# Patient Record
Sex: Male | Born: 1955 | Race: White | Hispanic: No | Smoking: Never smoker
Health system: Southern US, Community
[De-identification: ages and names within clinical notes are randomized; demographics above are authoritative.]

## PROBLEM LIST (undated history)

## (undated) DIAGNOSIS — Z9889 Other specified postprocedural states: Secondary | ICD-10-CM

## (undated) DIAGNOSIS — I739 Peripheral vascular disease, unspecified: Secondary | ICD-10-CM

## (undated) DIAGNOSIS — T7840XA Allergy, unspecified, initial encounter: Secondary | ICD-10-CM

## (undated) DIAGNOSIS — R0600 Dyspnea, unspecified: Secondary | ICD-10-CM

## (undated) DIAGNOSIS — I499 Cardiac arrhythmia, unspecified: Secondary | ICD-10-CM

## (undated) DIAGNOSIS — C801 Malignant (primary) neoplasm, unspecified: Secondary | ICD-10-CM

## (undated) DIAGNOSIS — I341 Nonrheumatic mitral (valve) prolapse: Secondary | ICD-10-CM

## (undated) DIAGNOSIS — F319 Bipolar disorder, unspecified: Secondary | ICD-10-CM

## (undated) DIAGNOSIS — F32A Depression, unspecified: Secondary | ICD-10-CM

## (undated) DIAGNOSIS — R011 Cardiac murmur, unspecified: Secondary | ICD-10-CM

## (undated) DIAGNOSIS — I2510 Atherosclerotic heart disease of native coronary artery without angina pectoris: Secondary | ICD-10-CM

## (undated) DIAGNOSIS — I10 Essential (primary) hypertension: Secondary | ICD-10-CM

## (undated) DIAGNOSIS — K759 Inflammatory liver disease, unspecified: Secondary | ICD-10-CM

## (undated) DIAGNOSIS — E785 Hyperlipidemia, unspecified: Secondary | ICD-10-CM

## (undated) DIAGNOSIS — F909 Attention-deficit hyperactivity disorder, unspecified type: Secondary | ICD-10-CM

## (undated) DIAGNOSIS — G4730 Sleep apnea, unspecified: Secondary | ICD-10-CM

## (undated) DIAGNOSIS — R7303 Prediabetes: Secondary | ICD-10-CM

## (undated) DIAGNOSIS — F410 Panic disorder [episodic paroxysmal anxiety] without agoraphobia: Secondary | ICD-10-CM

## (undated) DIAGNOSIS — F1910 Other psychoactive substance abuse, uncomplicated: Secondary | ICD-10-CM

## (undated) DIAGNOSIS — J45909 Unspecified asthma, uncomplicated: Secondary | ICD-10-CM

## (undated) DIAGNOSIS — Z8719 Personal history of other diseases of the digestive system: Secondary | ICD-10-CM

## (undated) DIAGNOSIS — H269 Unspecified cataract: Secondary | ICD-10-CM

## (undated) DIAGNOSIS — M1990 Unspecified osteoarthritis, unspecified site: Secondary | ICD-10-CM

## (undated) DIAGNOSIS — Q2112 Patent foramen ovale: Secondary | ICD-10-CM

## (undated) DIAGNOSIS — K219 Gastro-esophageal reflux disease without esophagitis: Secondary | ICD-10-CM

## (undated) DIAGNOSIS — L709 Acne, unspecified: Secondary | ICD-10-CM

## (undated) DIAGNOSIS — R112 Nausea with vomiting, unspecified: Secondary | ICD-10-CM

## (undated) HISTORY — DX: Patent foramen ovale: Q21.12

## (undated) HISTORY — DX: Hyperlipidemia, unspecified: E78.5

## (undated) HISTORY — PX: STRABISMUS SURGERY: SHX218

## (undated) HISTORY — DX: Unspecified cataract: H26.9

## (undated) HISTORY — PX: WISDOM TOOTH EXTRACTION: SHX21

## (undated) HISTORY — DX: Gastro-esophageal reflux disease without esophagitis: K21.9

## (undated) HISTORY — DX: Essential (primary) hypertension: I10

## (undated) HISTORY — DX: Unspecified asthma, uncomplicated: J45.909

## (undated) HISTORY — DX: Other psychoactive substance abuse, uncomplicated: F19.10

## (undated) HISTORY — DX: Allergy, unspecified, initial encounter: T78.40XA

## (undated) HISTORY — DX: Cardiac murmur, unspecified: R01.1

## (undated) HISTORY — PX: SQUINT REPAIR: SHX5212

## (undated) HISTORY — DX: Depression, unspecified: F32.A

## (undated) HISTORY — DX: Bipolar disorder, unspecified: F31.9

## (undated) HISTORY — PX: EYE SURGERY: SHX253

## (undated) HISTORY — PX: TONSILLECTOMY AND ADENOIDECTOMY: SHX28

---

## 1988-08-26 HISTORY — PX: MOHS SURGERY: SHX181

## 2004-12-17 ENCOUNTER — Ambulatory Visit: Payer: Self-pay | Admitting: Internal Medicine

## 2004-12-26 ENCOUNTER — Ambulatory Visit: Payer: Self-pay | Admitting: Internal Medicine

## 2006-05-26 ENCOUNTER — Ambulatory Visit: Payer: Self-pay | Admitting: Internal Medicine

## 2006-06-12 ENCOUNTER — Ambulatory Visit: Payer: Self-pay | Admitting: Internal Medicine

## 2007-11-05 ENCOUNTER — Encounter: Payer: Self-pay | Admitting: *Deleted

## 2007-11-05 DIAGNOSIS — I493 Ventricular premature depolarization: Secondary | ICD-10-CM | POA: Insufficient documentation

## 2007-11-05 DIAGNOSIS — I4949 Other premature depolarization: Secondary | ICD-10-CM

## 2007-11-05 DIAGNOSIS — L299 Pruritus, unspecified: Secondary | ICD-10-CM | POA: Insufficient documentation

## 2007-11-05 DIAGNOSIS — Z85828 Personal history of other malignant neoplasm of skin: Secondary | ICD-10-CM | POA: Insufficient documentation

## 2007-11-05 DIAGNOSIS — Z8679 Personal history of other diseases of the circulatory system: Secondary | ICD-10-CM | POA: Insufficient documentation

## 2007-11-05 DIAGNOSIS — E785 Hyperlipidemia, unspecified: Secondary | ICD-10-CM | POA: Insufficient documentation

## 2007-11-05 DIAGNOSIS — C44599 Other specified malignant neoplasm of skin of other part of trunk: Secondary | ICD-10-CM

## 2007-11-05 DIAGNOSIS — Z9089 Acquired absence of other organs: Secondary | ICD-10-CM

## 2007-11-05 DIAGNOSIS — F341 Dysthymic disorder: Secondary | ICD-10-CM | POA: Insufficient documentation

## 2007-11-05 DIAGNOSIS — L708 Other acne: Secondary | ICD-10-CM | POA: Insufficient documentation

## 2013-09-13 ENCOUNTER — Emergency Department (HOSPITAL_BASED_OUTPATIENT_CLINIC_OR_DEPARTMENT_OTHER)
Admission: EM | Admit: 2013-09-13 | Discharge: 2013-09-14 | Disposition: A | Discharge status: Home / Self Care | Payer: Self-pay | Attending: Emergency Medicine | Admitting: Emergency Medicine

## 2013-09-13 ENCOUNTER — Encounter (HOSPITAL_BASED_OUTPATIENT_CLINIC_OR_DEPARTMENT_OTHER): Payer: Self-pay | Admitting: Emergency Medicine

## 2013-09-13 DIAGNOSIS — F329 Major depressive disorder, single episode, unspecified: Secondary | ICD-10-CM

## 2013-09-13 DIAGNOSIS — Z872 Personal history of diseases of the skin and subcutaneous tissue: Secondary | ICD-10-CM | POA: Insufficient documentation

## 2013-09-13 DIAGNOSIS — F32A Depression, unspecified: Secondary | ICD-10-CM

## 2013-09-13 DIAGNOSIS — G4730 Sleep apnea, unspecified: Secondary | ICD-10-CM | POA: Insufficient documentation

## 2013-09-13 DIAGNOSIS — F419 Anxiety disorder, unspecified: Secondary | ICD-10-CM

## 2013-09-13 DIAGNOSIS — F410 Panic disorder [episodic paroxysmal anxiety] without agoraphobia: Secondary | ICD-10-CM

## 2013-09-13 DIAGNOSIS — F3289 Other specified depressive episodes: Secondary | ICD-10-CM | POA: Insufficient documentation

## 2013-09-13 DIAGNOSIS — R630 Anorexia: Secondary | ICD-10-CM | POA: Insufficient documentation

## 2013-09-13 HISTORY — DX: Panic disorder (episodic paroxysmal anxiety): F41.0

## 2013-09-13 HISTORY — DX: Acne, unspecified: L70.9

## 2013-09-13 LAB — URINALYSIS, ROUTINE W REFLEX MICROSCOPIC
BILIRUBIN URINE: NEGATIVE
Glucose, UA: NEGATIVE mg/dL
Hgb urine dipstick: NEGATIVE
Ketones, ur: NEGATIVE mg/dL
Leukocytes, UA: NEGATIVE
NITRITE: NEGATIVE
PH: 7.5 (ref 5.0–8.0)
Protein, ur: NEGATIVE mg/dL
SPECIFIC GRAVITY, URINE: 1.006 (ref 1.005–1.030)
Urobilinogen, UA: 0.2 mg/dL (ref 0.0–1.0)

## 2013-09-13 LAB — RAPID URINE DRUG SCREEN, HOSP PERFORMED
Amphetamines: NOT DETECTED
BENZODIAZEPINES: NOT DETECTED
Barbiturates: NOT DETECTED
COCAINE: NOT DETECTED
OPIATES: NOT DETECTED
Tetrahydrocannabinol: NOT DETECTED

## 2013-09-13 MED ORDER — BUSPIRONE HCL 7.5 MG PO TABS: 7.5000 mg | ORAL_TABLET | ORAL | Status: DC

## 2013-09-13 NOTE — ED Notes (Signed)
Tele-psych eval completed. Message given to EDP from pt's counselor

## 2013-09-13 NOTE — ED Provider Notes (Signed)
CSN: [id number]     Arrival date & time 09/13/13  1842 History  This chart was scribed for Neta Ehlers, MD by Zettie Pho, ED Scribe. This patient was seen in room MH06/MH06 and the patient's care was started at 8:55 PM.    Chief Complaint  Patient presents with  . Panic Attack   The history is provided by the patient. No language interpreter was used.   HPI Comments: Bryan Brock is a 58 y.o. Male with a history of panic attacks who presents to the Emergency Department complaining of multiple, intermittent panic attacks onset a few days ago, described as "racing thoughts," especially in the morning. He reports some associated depression with decreased concentration at work onset a few months ago. He states that these symptoms have been constant and occur daily. He reports some associated decreased sleeping and decreased appetite. Patient reports that he was prescribed Zoloft, which was effective at relieving his symptoms, but that he was taken off of this medication 2 years ago because he was doing better. Patient reports that he sees a counselor Forrest Moron) and is working on getting a psychiatrist, but does not have one currently. He states that he was hospitalized about 2-3 years ago for similar complaints. He denies suicidal or homicidal ideations, hallucinations. He denies fever, chills, nausea, emesis, diarrhea. Patient has no other pertinent medical history.   Past Medical History  Diagnosis Date  . Panic attacks   . Acne    No past surgical history on file. No family history on file. History  Substance Use Topics  . Smoking status: Not on file  . Smokeless tobacco: Not on file  . Alcohol Use: Not on file    Review of Systems  Constitutional: Positive for appetite change. Negative for fever, activity change and fatigue.  HENT: Negative for congestion, facial swelling, rhinorrhea and trouble swallowing.   Eyes: Negative for photophobia and pain.  Respiratory:  Negative for cough, chest tightness and shortness of breath.   Cardiovascular: Negative for chest pain and leg swelling.  Gastrointestinal: Negative for nausea, vomiting, abdominal pain, diarrhea and constipation.  Endocrine: Negative for polydipsia and polyuria.  Genitourinary: Negative for dysuria, urgency, decreased urine volume and difficulty urinating.  Musculoskeletal: Negative for back pain and gait problem.  Skin: Negative for color change, rash and wound.  Allergic/Immunologic: Negative for immunocompromised state.  Neurological: Negative for dizziness, facial asymmetry, speech difficulty, weakness, numbness and headaches.  Psychiatric/Behavioral: Positive for sleep disturbance and decreased concentration. Negative for suicidal ideas, hallucinations, confusion and agitation. The patient is nervous/anxious.     Allergies  Review of patient's allergies indicates not on file.  Home Medications   Current Outpatient Rx  Name  Route  Sig  Dispense  Refill  . AMOXICILLIN PO   Oral   Take by mouth.         . busPIRone (BUSPAR) 7.5 MG tablet   Oral   Take 1 tablet (7.5 mg total) by mouth 2 (two) times a week.   30 tablet   0    Triage Vitals: BP 154/91  Pulse 82  Temp(Src) 98.9 F (37.2 C) (Oral)  Resp 18  Ht 6' (1.829 m)  Wt 242 lb (109.77 kg)  BMI 32.81 kg/m2  SpO2 98%  Physical Exam  Constitutional: He is oriented to person, place, and time. He appears well-developed and well-nourished. No distress.  HENT:  Head: Normocephalic and atraumatic.  Mouth/Throat: No oropharyngeal exudate.  Eyes: Pupils are equal, round,  and reactive to light.  Neck: Normal range of motion. Neck supple.  Cardiovascular: Normal rate, regular rhythm and normal heart sounds.  Exam reveals no gallop and no friction rub.   No murmur heard. Pulmonary/Chest: Effort normal and breath sounds normal. No respiratory distress. He has no wheezes. He has no rales.  Abdominal: Soft. Bowel sounds are  normal. He exhibits no distension and no mass. There is no tenderness. There is no rebound and no guarding.  Musculoskeletal: Normal range of motion. He exhibits no edema and no tenderness.  Neurological: He is alert and oriented to person, place, and time.  Patient is oriented to day, month, and year. Normal strength and sensation throughout.   Skin: Skin is warm and dry.  Psychiatric: He has a normal mood and affect. His speech is normal and behavior is normal. Thought content normal.    ED Course  Procedures (including critical care time)  DIAGNOSTIC STUDIES: Oxygen Saturation is 98% on room air, normal by my interpretation.    COORDINATION OF CARE: 9:02 PM- Patient will consult to TTS. Will order UA. Discussed treatment plan with patient at bedside and patient verbalized agreement.     Labs Review Labs Reviewed  URINALYSIS, ROUTINE W REFLEX MICROSCOPIC  URINE RAPID DRUG SCREEN (HOSP PERFORMED)   Imaging Review No results found.  EKG Interpretation   None       MDM   1. Anxiety   2. Depression   3. Panic disorder    Pt is a 58 y.o. male with Pmhx as above who presents with about 2 months of worsening depression, daily panic attacks, and anxiety.  NO SI/HI, AVH.  Symptoms are affecting his work.  Hx of similar about 2 years ago improve w/ zoloft. Pt has hx of hospitalization for similar symptoms.  He sees a Social worker, but has not yet been able to establish w/ a psychiatrist.  Pt requesting to restart Zoloft as he was on a few years ago.  I have had pt speak w/ TTS provider with plan to start Buspar 7.5 BID which we believe would better treat symptoms that seem primarily anxiety related.  I have also spoke to pt's counselor, Odie Sera, over the phone who is hoping to get him in w/ Dr. Suzie Portela in [place] this Friday or Monday.  Pt given resources for [facility] or return ED visit in case of emergent symptoms such as SI/HI.  Pt & family agree with close outpt f/u.        I  personally performed the services described in this documentation, which was scribed in my presence. The recorded information has been reviewed and is accurate.      Neta Ehlers, MD 09/14/13 1101

## 2013-09-13 NOTE — ED Notes (Signed)
Remote tele-view computer setup at bedside waiting sign on by interviewer from TTS

## 2013-09-13 NOTE — Discharge Instructions (Signed)
Depression, Adult Depression is feeling sad, low, down in the dumps, blue, gloomy, or empty. In general, there are two kinds of depression:  Normal sadness or grief. This can happen after something upsetting. It often goes away on its own within 2 weeks. After losing a loved one (bereavement), normal sadness and grief may last longer than two weeks. It usually gets better with time.  Clinical depression. This kind lasts longer than normal sadness or grief. It keeps you from doing the things you normally do in life. It is often hard to function at home, work, or at school. It may affect your relationships with others. Treatment is often needed. GET HELP RIGHT AWAY IF:  You have thoughts about hurting yourself or others.  You lose touch with reality (psychotic symptoms). You may:  See or hear things that are not real.  Have untrue beliefs about your life or people around you.  Your medicine is giving you problems. MAKE SURE YOU:  Understand these instructions.  Will watch your condition.  Will get help right away if you are not doing well or get worse. Document Released: 09/14/2010 Document Revised: 05/06/2012 Document Reviewed: 12/12/2011 [facility] Patient Information 2014 [place], [place]. Panic Attacks Panic attacks are sudden, short-livedsurges of severe anxiety, fear, or discomfort. They may occur for no reason when you are relaxed, when you are anxious, or when you are sleeping. Panic attacks may occur for a number of reasons:   Healthy people occasionally have panic attacks in extreme, life-threatening situations, such as war or natural disasters. Normal anxiety is a protective mechanism of the body that helps [place] react to danger (fight or flight response).  Panic attacks are often seen with anxiety disorders, such as panic disorder, social anxiety disorder, generalized anxiety disorder, and phobias. Anxiety disorders cause excessive or uncontrollable anxiety. They may interfere  with your relationships or other life activities.  Panic attacks are sometimes seen with other mental illnesses such as depression and posttraumatic stress disorder.  Certain medical conditions, prescription medicines, and drugs of abuse can cause panic attacks. SYMPTOMS  Panic attacks start suddenly, peak within 20 minutes, and are accompanied by four or more of the following symptoms:  Pounding heart or fast heart rate (palpitations).  Sweating.  Trembling or shaking.  Shortness of breath or feeling smothered.  Feeling choked.  Chest pain or discomfort.  Nausea or strange feeling in your stomach.  Dizziness, lightheadedness, or feeling like you will faint.  Chills or hot flushes.  Numbness or tingling in your lips or hands and feet.  Feeling that things are not real or feeling that you are not yourself.  Fear of losing control or going crazy.  Fear of dying. Some of these symptoms can mimic serious medical conditions. For example, you may think you are having a heart attack. Although panic attacks can be very scary, they are not life threatening. DIAGNOSIS  Panic attacks are diagnosed through an assessment by your health care provider. Your health care provider will ask questions about your symptoms, such as where and when they occurred. Your health care provider will also ask about your medical history and use of alcohol and drugs, including prescription medicines. Your health care provider may order blood tests or other studies to rule out a serious medical condition. Your health care provider may refer you to a mental health professional for further evaluation. TREATMENT   Most healthy people who have one or two panic attacks in an extreme, life-threatening situation will not require  treatment.  The treatment for panic attacks associated with anxiety disorders or other mental illness typically involves counseling with a mental health professional, medicine, or a  combination of both. Your health care provider will help determine what treatment is best for you.  Panic attacks due to physical illness usually goes away with treatment of the illness. If prescription medicine is causing panic attacks, talk with your health care provider about stopping the medicine, decreasing the dose, or substituting another medicine.  Panic attacks due to alcohol or drug abuse goes away with abstinence. Some adults need professional help in order to stop drinking or using drugs. HOME CARE INSTRUCTIONS   Take all your medicines as prescribed.   Check with your health care provider before starting new prescription or over-the-counter medicines.  Keep all follow up appointments with your health care provider. SEEK MEDICAL CARE IF:  You are not able to take your medicines as prescribed.  Your symptoms do not improve or get worse. SEEK IMMEDIATE MEDICAL CARE IF:   You experience panic attack symptoms that are different than your usual symptoms.  You have serious thoughts about hurting yourself or others.  You are taking medicine for panic attacks and have a serious side effect. MAKE SURE YOU:  Understand these instructions.  Will watch your condition.  Will get help right away if you are not doing well or get worse. Document Released: 08/12/2005 Document Revised: 06/02/2013 Document Reviewed: 03/26/2013 [facility] Patient Information 2014 [place].

## 2013-09-13 NOTE — BH Assessment (Signed)
[facility] Assessment Progress Note      Spoke with EDP Tawni Levy for clinical information prior to TTS consult. History of depression and anxiety and panic attacks.  Has had one prior psychiatric admission in [place] for anxiety and depression.  Two months of worsening anxiety and panic attacks, interfering with work and normal activities.  No SI/HI or AVH.  Really wants to start Zoloft, but has no psychiatrist.

## 2013-09-13 NOTE — ED Notes (Signed)
States he is having panic attacks. Was taken off his medication 2 years ago. States he just feels panicky.

## 2013-09-13 NOTE — ED Notes (Addendum)
Labs reviewed, second call to TTS requested. Return call received pt is on list for evaluation.

## 2013-09-13 NOTE — ED Notes (Signed)
Urine specimen obtained and given to tech Jenny Reichmann

## 2013-09-14 MED ORDER — BUSPIRONE HCL 15 MG PO TABS
7.5000 mg | ORAL_TABLET | Freq: Once | ORAL | Status: DC
  Filled 2013-09-14: qty 1

## 2013-09-14 NOTE — ED Notes (Signed)
Pt. Wife called and questioned the script written by Dr. Tawnya Crook.  Verified script with Dr. Florina Ou about the Buspar script and explained to wife of pt. How the Pt. Is to take med.

## 2013-09-14 NOTE — BH Assessment (Signed)
Tele Assessment Note   Bryan Brock is an 58 y.o. male who presents to [name] seeking assistance with his anxiety.  He reports that he used to take Zoloft 100 mg to manage his depression and anxiety for 5 years, but became asymptomatic and has been off of the meds for 2 years.  However, he has new responsibilities and stress at work and is also in the middle of a separation from his spouse, which resulted in estrangement from his children and him being all alone and he has noticed a worsening of his depression and anxiety.  He reports he sees Psychologist, clinical at [name] in [name], but has not been able to get into a psychiatrist yet and does not have a primary care physician due to lack of insurance.  He reports that he can no longer function with these symptoms.  He states he feels lie he is constantly living in a what if situation, always feeling panicked, and like he's quivering inside.  He says he cannot concentrate and is very forgetful at work and that he feels like he always has the on edge sensation like he has just been startled.  He reports feeling trapped in his own thoughts, which are always racing and wake him in the middle of the night and that he feels apprehensive and out of control.  He's concerned because he feels like he cannot function at work or at home.  He had a panic attack today that he could not get through and had to call his niece to pick him up at work.  He also reports worsening depression including feelings of worthlessness, guilt, fatigue, insomnia, isolating behavior, anhedonia, and weight loss.  He denies any thoughts, plans, or intent to commit suicide.  He denies any thoughts, plans, or intention to commit homicide.  He denies auditory or visual hallucinations.  He reports he just wants to get restarted on his Zoloft and would like a note to have a few days off work to get his head straightened out.     This Probation officer consulted with Bryan Brock, [facility]  PA who does not recommend Zoloft, but would recommend 7.5 mg of Buspar BID for anxiety.  He also believes the patient has a case for admission due to his inability to function and limited resources for follow up.  This Probation officer presented these recommendations to [facility] who will write the prescription as well as the note for an excused absence from work.  This Probation officer recommends the patient follow up with [name] [phone], to the walk in clinic for continued psychiatric medication evaluation as well as pursuing the psychiatrist his therapist is trying to get him in to see.  The patient was also educated on suicide prevention and informed that should his symptoms become worse or should he begin to have suicidal ideation he should report to the nearest emergency room or contact [facility] who will come to his house for an assessment [phone].  Axis I: Depressive Disorder NOS and Generalized Anxiety Disorder Axis II: Deferred Axis III:  Past Medical History  Diagnosis Date  . Panic attacks   . Acne    Axis IV: occupational problems, problems with access to health care services and problems with primary support group Axis V: 51-60 moderate symptoms  Past Medical History:  Past Medical History  Diagnosis Date  . Panic attacks   . Acne  No past surgical history on file.  Family History: No family history on file.  Social History:  has no tobacco, alcohol, and drug history on file.  Additional Social History:  Alcohol / Drug Use History of alcohol / drug use?: No history of alcohol / drug abuse  CIWA: CIWA-Ar BP: 154/91 mmHg Pulse Rate: 82 COWS:    Allergies: Allergies not on file  Home Medications:  (Not in a hospital admission)  OB/GYN Status:  No LMP for male patient.  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  Risk to self Suicidal Ideation: No Suicidal Intent: No Is patient at risk for suicide?: No Suicidal Plan?: No Access to Means: No What has been your use of drugs/alcohol within the last 12 months?: denies Previous Attempts/Gestures: No How many times?: 0 Intentional Self Injurious Behavior: None Family Suicide History: No Recent stressful life event(s): Conflict (Comment);Turmoil (Comment) (separation from wife, estranged from children, work is Production manager) Persecutory voices/beliefs?: No Depression: Yes Depression Symptoms: Despondent;Insomnia;Tearfulness;Fatigue;Guilt;Loss of interest in usual pleasures;Feeling worthless/self pity;Feeling angry/irritable;Isolating Substance abuse history and/or treatment for substance abuse?: No Suicide prevention information given to non-admitted patients: Yes  Risk to Others Homicidal Ideation: No Thoughts of Harm to Others: No Current Homicidal Intent: No Current Homicidal Plan: No Access to Homicidal Means: No History of harm to others?: No Assessment of Violence: None Noted Does patient have access to weapons?: No Criminal Charges Pending?: No Does patient have a court date: No  Psychosis Hallucinations: None noted Delusions: None noted  Mental Status Report Appear/Hygiene: Other (Comment) (unremarkable) Eye Contact: Good Motor Activity: Freedom of movement Speech:  Logical/coherent Level of Consciousness: Alert Mood: Depressed;Anxious Affect: Anxious Anxiety Level: Panic Attacks Panic attack frequency: several daily Most recent panic attack: today Thought Processes: Coherent;Relevant Judgement: Unimpaired Orientation: Time;Situation;Place;Person Obsessive Compulsive Thoughts/Behaviors: Moderate  Cognitive Functioning Concentration: Decreased Memory: Remote Impaired;Recent Impaired IQ: Average Insight: Good Impulse Control: Fair Appetite: Poor Weight Loss: 6 Weight Gain: 0 Sleep: Decreased Total Hours of Sleep: 6 Vegetative Symptoms: None  ADLScreening [facility] Assessment Services) Patient's cognitive ability adequate to safely complete daily activities?: Yes Patient able to express need for assistance with ADLs?: Yes Independently performs ADLs?: Yes (appropriate for developmental age)  Prior Inpatient Therapy Prior Inpatient Therapy: Yes Prior Therapy Dates: 2012 Prior Therapy Facilty/Provider(s): [facility] [place], [place] Reason for Treatment: Depression, SI  Prior Outpatient Therapy Prior Outpatient Therapy: Yes Prior Therapy Dates: ongoing Prior Therapy Facilty/Provider(s): Bryan Brock Reason for Treatment: depression, anxiety  ADL Screening (condition at time of admission) Patient's cognitive ability adequate to safely complete daily activities?: Yes Patient able to express need for assistance with ADLs?: Yes Independently performs ADLs?: Yes (appropriate for developmental age)       Abuse/Neglect Assessment (Assessment to be complete while patient is alone) Physical Abuse: Denies Verbal Abuse: Denies Sexual Abuse: Denies Exploitation of patient/patient's resources: Denies Values / Beliefs Cultural Requests During Hospitalization: None Spiritual Requests During Hospitalization: None   Advance Directives (For Healthcare) Advance  Directive: Patient does not have advance directive;Patient would not like  information Nutrition Screen- [facility] Adult/[facility]/AP Patient's home diet: Regular  Additional Information 1:1 In Past 12 Months?: No CIRT Risk: No Elopement Risk: No Does patient have medical clearance?: Yes     Disposition:  Disposition Initial Assessment Completed for this Encounter: Yes Disposition of Patient: Other dispositions;Outpatient treatment Type of outpatient treatment: Adult Other disposition(s): Information only;To current provider  Darlys Gales 09/14/2013 12:09 AM

## 2020-05-10 ENCOUNTER — Other Ambulatory Visit: Payer: Self-pay

## 2020-05-11 ENCOUNTER — Ambulatory Visit (INDEPENDENT_AMBULATORY_CARE_PROVIDER_SITE_OTHER): Payer: Medicare Other | Admitting: Family Medicine

## 2020-05-11 ENCOUNTER — Encounter: Payer: Self-pay | Admitting: Family Medicine

## 2020-05-11 VITALS — BP 152/88 | HR 78 | Temp 98.2°F | Ht 71.5 in | Wt 279.2 lb

## 2020-05-11 DIAGNOSIS — Z833 Family history of diabetes mellitus: Secondary | ICD-10-CM | POA: Diagnosis not present

## 2020-05-11 DIAGNOSIS — Z1211 Encounter for screening for malignant neoplasm of colon: Secondary | ICD-10-CM | POA: Diagnosis not present

## 2020-05-11 DIAGNOSIS — Z6838 Body mass index (BMI) 38.0-38.9, adult: Secondary | ICD-10-CM | POA: Diagnosis not present

## 2020-05-11 DIAGNOSIS — Z8249 Family history of ischemic heart disease and other diseases of the circulatory system: Secondary | ICD-10-CM

## 2020-05-11 DIAGNOSIS — Z2821 Immunization not carried out because of patient refusal: Secondary | ICD-10-CM

## 2020-05-11 DIAGNOSIS — Z0000 Encounter for general adult medical examination without abnormal findings: Secondary | ICD-10-CM

## 2020-05-11 DIAGNOSIS — F3181 Bipolar II disorder: Secondary | ICD-10-CM | POA: Diagnosis not present

## 2020-05-11 DIAGNOSIS — F319 Bipolar disorder, unspecified: Secondary | ICD-10-CM | POA: Insufficient documentation

## 2020-05-11 LAB — LIPID PANEL
Cholesterol: 210 mg/dL — ABNORMAL HIGH (ref 0–200)
HDL: 50.5 mg/dL (ref >39.00)
LDL Cholesterol: 137 mg/dL — ABNORMAL HIGH (ref 0–99)
NonHDL: 159.78
Total CHOL/HDL Ratio: 4
Triglycerides: 115 mg/dL (ref 0.0–149.0)
VLDL: 23 mg/dL (ref 0.0–40.0)

## 2020-05-11 LAB — COMPREHENSIVE METABOLIC PANEL
ALT: 45 U/L (ref 0–53)
AST: 40 U/L — ABNORMAL HIGH (ref 0–37)
Albumin: 4.6 g/dL (ref 3.5–5.2)
Alkaline Phosphatase: 75 U/L (ref 39–117)
BUN: 19 mg/dL (ref 6–23)
CO2: 24 mEq/L (ref 19–32)
Calcium: 9.4 mg/dL (ref 8.4–10.5)
Chloride: 102 mEq/L (ref 96–112)
Creatinine, Ser: 1.14 mg/dL (ref 0.40–1.50)
GFR: 64.41 mL/min (ref >60.00)
Glucose, Bld: 104 mg/dL — ABNORMAL HIGH (ref 70–99)
Potassium: 3.8 mEq/L (ref 3.5–5.1)
Sodium: 133 mEq/L — ABNORMAL LOW (ref 135–145)
Total Bilirubin: 1.1 mg/dL (ref 0.2–1.2)
Total Protein: 7.3 g/dL (ref 6.0–8.3)

## 2020-05-11 LAB — CBC
HCT: 47.4 % (ref 39.0–52.0)
Hemoglobin: 15.8 g/dL (ref 13.0–17.0)
MCHC: 33.2 g/dL (ref 30.0–36.0)
MCV: 89.1 fl (ref 78.0–100.0)
Platelets: 158 10*3/uL (ref 150.0–400.0)
RBC: 5.32 Mil/uL (ref 4.22–5.81)
RDW: 13.9 % (ref 11.5–15.5)
WBC: 5.4 10*3/uL (ref 4.0–10.5)

## 2020-05-11 LAB — HEMOGLOBIN A1C: Hgb A1c MFr Bld: 6 % (ref 4.6–6.5)

## 2020-05-11 NOTE — Progress Notes (Signed)
Bryan Brock is a 64 y.o. male  Chief Complaint  Patient presents with  . Establish Care    NP- CPE,  fatsing this afternoon, no concern    HPI: Bryan Brock is a 64 y.o. male here to establish care with our office and for annual exam as well as to f/u on chronic medical issues including hyperlipidemia. Pt has bipolar, currently on lamictal and pt states symptoms very well-controlled. Pt follows with Dr. Rolena Infante (psych).   Derm - Dr. Mallie Snooks in [place], [place] - h/o [facility] on back.  Pt requests labs today and is fasting. He would like A1C and states his insurance will cover fully.  Pt has 2 grown children and partner (male), as well as 2 dogs. Pt is a former ortho device rep. Pt agrees to Tdap, declines flu vaccine. Pt had 1 covid vaccine but refuses second. He requests Rx for hydroxychloroquine and states if I will not give this to him, he'll pursue it with another physician. I explained I am not willing to write this Rx.  Last Colonoscopy: never had one, no fam h/o CRC  Diet/Exercise: pt has lost 21lbs since 01/2020 with new diet and increased exercise Dental: overdue - pt plans to schedule Vision: pt wears reading glasses due for exam   Past Medical History:  Diagnosis Date  . Acne   . Bipolar 1 disorder ([place])   . Depression   . Panic attacks     Past Surgical History:  Procedure Laterality Date  . MOHS SURGERY  1990  . SQUINT REPAIR Left   . TONSILLECTOMY AND ADENOIDECTOMY      Social History   Socioeconomic History  . Marital status: Soil scientist    Spouse name: Not on file  . Number of children: Not on file  . Years of education: Not on file  . Highest education level: Not on file  Occupational History  . Not on file  Tobacco Use  . Smoking status: Never Smoker  . Smokeless tobacco: Current User    Types: Chew  Vaping Use  . Vaping Use: Never used  Substance and Sexual Activity  . Alcohol use: Yes    Comment: occasionanly  . Drug use: Never  .  Sexual activity: Yes  Other Topics Concern  . Not on file  Social History Narrative  . Not on file   Social Determinants of Health   Financial Resource Strain:   . Difficulty of Paying Living Expenses: Not on file  Food Insecurity:   . Worried About Charity fundraiser in the Last Year: Not on file  . Ran Out of Food in the Last Year: Not on file  Transportation Needs:   . Lack of Transportation (Medical): Not on file  . Lack of Transportation (Non-Medical): Not on file  Physical Activity:   . Days of Exercise per Week: Not on file  . Minutes of Exercise per Session: Not on file  Stress:   . Feeling of Stress : Not on file  Social Connections:   . Frequency of Communication with Friends and Family: Not on file  . Frequency of Social Gatherings with Friends and Family: Not on file  . Attends Religious Services: Not on file  . Active Member of Clubs or Organizations: Not on file  . Attends Archivist Meetings: Not on file  . Marital Status: Not on file  Intimate Partner Violence:   . Fear of Current or Ex-Partner: Not on file  .  Emotionally Abused: Not on file  . Physically Abused: Not on file  . Sexually Abused: Not on file    Family History  Problem Relation Age of Onset  . Heart disease Mother   . Diabetes Mother   . Obesity Mother   . Hyperlipidemia Mother   . Cancer Father        skin cancer  . Heart disease Father   . Hyperlipidemia Father      Immunization History  Administered Date(s) Administered  . PFIZER SARS-COV-2 Vaccination 04/06/2020    Outpatient Encounter Medications as of 05/11/2020  Medication Sig  . Ascorbic Acid (VITAMIN C) 1000 MG tablet Take 2,000 mg by mouth daily.  Marland Kitchen co-enzyme Q-10 30 MG capsule Take 30 mg by mouth 3 (three) times daily.  . folic acid (FOLVITE) 676 MCG tablet Take 400 mcg by mouth daily.  . magnesium oxide (MAG-OX) 400 MG tablet Take 400 mg by mouth daily.  . Multiple Vitamins-Minerals (MULTIVITAMIN WITH  MINERALS) tablet Take 1 tablet by mouth daily.  . Omega 3 1000 MG CAPS Take by mouth.  . lamoTRIgine (LAMICTAL) 100 MG tablet Take 100 mg by mouth daily.  . [DISCONTINUED] AMOXICILLIN PO Take by mouth.  . [DISCONTINUED] busPIRone (BUSPAR) 7.5 MG tablet Take 1 tablet (7.5 mg total) by mouth 2 (two) times a week.   No facility-administered encounter medications on file as of 05/11/2020.     ROS: Gen: no fever, chills  Skin: no rash, itching ENT: no ear pain, ear drainage, nasal congestion, rhinorrhea, sinus pressure, sore throat Eyes: needs glasses for up close reading Resp: no cough, wheeze,SOB CV: no CP, palpitations, LE edema,  GI: no heartburn, n/v/d/c, abd pain GU: no dysuria, urgency, frequency, hematuria MSK: Rt knee pain Neuro: no dizziness, headache, weakness Psych: no depression, anxiety, insomnia   Not on File  BP (!) 152/88   Pulse 78   Temp 98.2 F (36.8 C) (Temporal)   Ht 5' 11.5" (1.816 m)   Wt 279 lb 3.2 oz (126.6 kg)   SpO2 97%   BMI 38.40 kg/m   Physical Exam Constitutional:      General: He is not in acute distress.    Appearance: He is well-developed.  HENT:     Head: Normocephalic and atraumatic.     Right Ear: Tympanic membrane and ear canal normal.     Left Ear: Tympanic membrane and ear canal normal.     Nose: Nose normal.  Neck:     Thyroid: No thyromegaly.  Cardiovascular:     Rate and Rhythm: Normal rate and regular rhythm.     Heart sounds: Normal heart sounds.  Pulmonary:     Effort: Pulmonary effort is normal.     Breath sounds: Normal breath sounds. No wheezing or rhonchi.  Abdominal:     General: Bowel sounds are normal. There is no distension.     Palpations: Abdomen is soft. There is no mass.     Tenderness: There is no abdominal tenderness. There is no guarding or rebound.  Musculoskeletal:        General: Normal range of motion.     Cervical back: Neck supple.  Lymphadenopathy:     Cervical: No cervical adenopathy.   Skin:    General: Skin is warm and dry.  Neurological:     Mental Status: He is alert and oriented to person, place, and time.     Motor: No abnormal muscle tone.     Coordination: Coordination normal.  A/P:  1. Annual physical exam - overdue for colonoscopy - referral placed today - overdue for vision and dental exam - pt plans to schedule in the future - congratulated pt on 21lbs weight loss in 3 mo with improved diet and regular CV exercise - Comprehensive metabolic panel - Lipid panel - pt states medicare will cover all labs "at 100%" despite my caution that very often that is not the case. He would like labs done today.  2. Family history of coronary artery disease in father - father with CAD, stents, MI - Comprehensive metabolic panel - Lipid panel  3. Bipolar 2 disorder ([place]) - on lamictal, follows with psychiatry  4. Influenza vaccination declined by patient  5. Screening for colon cancer - Ambulatory referral to Gastroenterology  6. Body mass index (BMI) 38.0-38.9, adult - Hemoglobin A1c - CBC  7. Family history of diabetes mellitus in mother - Hemoglobin A1C  This visit occurred during the SARS-CoV-2 public health emergency.  Safety protocols were in place, including screening questions prior to the visit, additional usage of staff PPE, and extensive cleaning of exam room while observing appropriate contact time as indicated for disinfecting solutions.

## 2020-05-12 ENCOUNTER — Encounter: Payer: Self-pay | Admitting: Family Medicine

## 2021-02-05 ENCOUNTER — Telehealth: Payer: Self-pay | Admitting: Family Medicine

## 2021-02-05 NOTE — Telephone Encounter (Signed)
Left message for patient to call back and schedule Medicare Annual Wellness Visit (AWV).   Please offer to do virtually or by telephone.   Due for AWVI  Please schedule at anytime with Nurse Health Advisor.   

## 2021-02-20 ENCOUNTER — Ambulatory Visit (INDEPENDENT_AMBULATORY_CARE_PROVIDER_SITE_OTHER): Payer: Medicare Other | Admitting: *Deleted

## 2021-02-20 DIAGNOSIS — Z1211 Encounter for screening for malignant neoplasm of colon: Secondary | ICD-10-CM

## 2021-02-20 DIAGNOSIS — Z0000 Encounter for general adult medical examination without abnormal findings: Secondary | ICD-10-CM | POA: Diagnosis not present

## 2021-02-20 NOTE — Patient Instructions (Signed)
Bryan Brock , Thank you for taking time to come for your Medicare Wellness Visit. I appreciate your ongoing commitment to your health goals. Please review the following plan we discussed and let me know if I can assist you in the future.   Screening recommendations/referrals: Colonoscopy: education provided   Recommended yearly ophthalmology/optometry visit for glaucoma screening and checkup Recommended yearly dental visit for hygiene and checkup  Vaccinations: Influenza vaccine: education provided Pneumococcal vaccine: education provided Tdap vaccine: education provided Shingles vaccine: education provided    Advanced directives: education provided  Conditions/risks identified: na    Preventive Care 66 Years and Older, Male Preventive care refers to lifestyle choices and visits with your health care provider that can promote health and wellness. What does preventive care include? A yearly physical exam. This is also called an annual well check. Dental exams once or twice a year. Routine eye exams. Ask your health care provider how often you should have your eyes checked. Personal lifestyle choices, including: Daily care of your teeth and gums. Regular physical activity. Eating a healthy diet. Avoiding tobacco and drug use. Limiting alcohol use. Practicing safe sex. Taking low doses of aspirin every day. Taking vitamin and mineral supplements as recommended by your health care provider. What happens during an annual well check? The services and screenings done by your health care provider during your annual well check will depend on your age, overall health, lifestyle risk factors, and family history of disease. Counseling  Your health care provider may ask you questions about your: Alcohol use. Tobacco use. Drug use. Emotional well-being. Home and relationship well-being. Sexual activity. Eating habits. History of falls. Memory and ability to understand  (cognition). Work and work Statistician. Screening  You may have the following tests or measurements: Height, weight, and BMI. Blood pressure. Lipid and cholesterol levels. These may be checked every 5 years, or more frequently if you are over 66 years old. Skin check. Lung cancer screening. You may have this screening every year starting at age 66 if you have a 30-pack-year history of smoking and currently smoke or have quit within the past 15 years. Fecal occult blood test (FOBT) of the stool. You may have this test every year starting at age 66. Flexible sigmoidoscopy or colonoscopy. You may have a sigmoidoscopy every 5 years or a colonoscopy every 10 years starting at age 66. Prostate cancer screening. Recommendations will vary depending on your family history and other risks. Hepatitis C blood test. Hepatitis B blood test. Sexually transmitted disease (STD) testing. Diabetes screening. This is done by checking your blood sugar (glucose) after you have not eaten for a while (fasting). You may have this done every 1-3 years. Abdominal aortic aneurysm (AAA) screening. You may need this if you are a current or former smoker. Osteoporosis. You may be screened starting at age 70 if you are at high risk. Talk with your health care provider about your test results, treatment options, and if necessary, the need for more tests. Vaccines  Your health care provider may recommend certain vaccines, such as: Influenza vaccine. This is recommended every year. Tetanus, diphtheria, and acellular pertussis (Tdap, Td) vaccine. You may need a Td booster every 10 years. Zoster vaccine. You may need this after age 27. Pneumococcal 13-valent conjugate (PCV13) vaccine. One dose is recommended after age 79. Pneumococcal polysaccharide (PPSV23) vaccine. One dose is recommended after age 54. Talk to your health care provider about which screenings and vaccines you need and how often you need  them. This  information is not intended to replace advice given to you by your health care provider. Make sure you discuss any questions you have with your health care provider. Document Released: 09/08/2015 Document Revised: 05/01/2016 Document Reviewed: 06/13/2015 Elsevier Interactive Patient Education  2017 [place] Prevention in the Home Falls can cause injuries. They can happen to people of all ages. There are many things you can do to make your home safe and to help prevent falls. What can I do on the outside of my home? Regularly fix the edges of walkways and driveways and fix any cracks. Remove anything that might make you trip as you walk through a door, such as a raised step or threshold. Trim any bushes or trees on the path to your home. Use bright outdoor lighting. Clear any walking paths of anything that might make someone trip, such as rocks or tools. Regularly check to see if handrails are loose or broken. Make sure that both sides of any steps have handrails. Any raised decks and porches should have guardrails on the edges. Have any leaves, snow, or ice cleared regularly. Use sand or salt on walking paths during winter. Clean up any spills in your garage right away. This includes oil or grease spills. What can I do in the bathroom? Use night lights. Install grab bars by the toilet and in the tub and shower. Do not use towel bars as grab bars. Use non-skid mats or decals in the tub or shower. If you need to sit down in the shower, use a plastic, non-slip stool. Keep the floor dry. Clean up any water that spills on the floor as soon as it happens. Remove soap buildup in the tub or shower regularly. Attach bath mats securely with double-sided non-slip rug tape. Do not have throw rugs and other things on the floor that can make you trip. What can I do in the bedroom? Use night lights. Make sure that you have a light by your bed that is easy to reach. Do not use any sheets or  blankets that are too big for your bed. They should not hang down onto the floor. Have a firm chair that has side arms. You can use this for support while you get dressed. Do not have throw rugs and other things on the floor that can make you trip. What can I do in the kitchen? Clean up any spills right away. Avoid walking on wet floors. Keep items that you use a lot in easy-to-reach places. If you need to reach something above you, use a strong step stool that has a grab bar. Keep electrical cords out of the way. Do not use floor polish or wax that makes floors slippery. If you must use wax, use non-skid floor wax. Do not have throw rugs and other things on the floor that can make you trip. What can I do with my stairs? Do not leave any items on the stairs. Make sure that there are handrails on both sides of the stairs and use them. Fix handrails that are broken or loose. Make sure that handrails are as long as the stairways. Check any carpeting to make sure that it is firmly attached to the stairs. Fix any carpet that is loose or worn. Avoid having throw rugs at the top or bottom of the stairs. If you do have throw rugs, attach them to the floor with carpet tape. Make sure that you have a light switch at  the top of the stairs and the bottom of the stairs. If you do not have them, ask someone to add them for you. What else can I do to help prevent falls? Wear shoes that: Do not have high heels. Have rubber bottoms. Are comfortable and fit you well. Are closed at the toe. Do not wear sandals. If you use a stepladder: Make sure that it is fully opened. Do not climb a closed stepladder. Make sure that both sides of the stepladder are locked into place. Ask someone to hold it for you, if possible. Clearly mark and make sure that you can see: Any grab bars or handrails. First and last steps. Where the edge of each step is. Use tools that help you move around (mobility aids) if they are  needed. These include: Canes. Walkers. Scooters. Crutches. Turn on the lights when you go into a dark area. Replace any light bulbs as soon as they burn out. Set up your furniture so you have a clear path. Avoid moving your furniture around. If any of your floors are uneven, fix them. If there are any pets around you, be aware of where they are. Review your medicines with your doctor. Some medicines can make you feel dizzy. This can increase your chance of falling. Ask your doctor what other things that you can do to help prevent falls. This information is not intended to replace advice given to you by your health care provider. Make sure you discuss any questions you have with your health care provider. Document Released: 06/08/2009 Document Revised: 01/18/2016 Document Reviewed: 09/16/2014 Elsevier Interactive Patient Education  2017 [employer].

## 2021-02-20 NOTE — Progress Notes (Addendum)
Subjective:   Bryan Brock is a 65 y.o. male who presents for an Initial Medicare Annual Wellness Visit.  I connected with  Yehuda Savannah on 02/20/21 by a telephone enabled telemedicine application and verified that I am speaking with the correct person using two identifiers.   I discussed the limitations of evaluation and management by telemedicine. The patient expressed understanding and agreed to proceed.  Patient location: home  Provider location:Tele-Health visit     Review of Systems    na Cardiac Risk Factors include: advanced age (>78men, >81 women);male gender;obesity (BMI >30kg/m2)     Objective:    Today's Vitals   There is no height or weight on file to calculate BMI.  Advanced Directives 02/20/2021 09/13/2013  Does Patient Have a Medical Advance Directive? No Patient does not have advance directive;Patient would not like information  Would patient like information on creating a medical advance directive? No - Patient declined -    Current Medications (verified) Outpatient Encounter Medications as of 02/20/2021  Medication Sig   Ascorbic Acid (VITAMIN C) 1000 MG tablet Take 2,000 mg by mouth daily.   co-enzyme Q-10 30 MG capsule Take 30 mg by mouth 3 (three) times daily.   folic acid (FOLVITE) 295 MCG tablet Take 400 mcg by mouth daily.   lamoTRIgine (LAMICTAL) 100 MG tablet Take 100 mg by mouth daily.   magnesium oxide (MAG-OX) 400 MG tablet Take 400 mg by mouth daily.   Multiple Vitamins-Minerals (MULTIVITAMIN WITH MINERALS) tablet Take 1 tablet by mouth daily.   Omega 3 1000 MG CAPS Take by mouth.   No facility-administered encounter medications on file as of 02/20/2021.    Allergies (verified) Patient has no allergy information on record.   History: Past Medical History:  Diagnosis Date   Acne    Bipolar 1 disorder ([place])    Depression    Panic attacks    Past Surgical History:  Procedure Laterality Date   MOHS SURGERY  1990   SQUINT REPAIR  Left    TONSILLECTOMY AND ADENOIDECTOMY     Family History  Problem Relation Age of Onset   Heart disease Mother    Diabetes Mother    Obesity Mother    Hyperlipidemia Mother    Cancer Father        skin cancer   Heart disease Father    Hyperlipidemia Father    Social History   Socioeconomic History   Marital status: Soil scientist    Spouse name: Not on file   Number of children: Not on file   Years of education: Not on file   Highest education level: Not on file  Occupational History   Not on file  Tobacco Use   Smoking status: Never   Smokeless tobacco: Current    Types: Chew  Vaping Use   Vaping Use: Never used  Substance and Sexual Activity   Alcohol use: Yes    Comment: occasionanly   Drug use: Never   Sexual activity: Yes  Other Topics Concern   Not on file  Social History Narrative   Not on file   Social Determinants of Health   Financial Resource Strain: Low Risk    Difficulty of Paying Living Expenses: Not hard at all  Food Insecurity: No Food Insecurity   Worried About Charity fundraiser in the Last Year: Never true   [place] in the Last Year: Never true  Transportation Needs: No Transportation Needs  Lack of Transportation (Medical): No   Lack of Transportation (Non-Medical): No  Physical Activity: Sufficiently Active   Days of Exercise per Week: 3 days   Minutes of Exercise per Session: 60 min  Stress: No Stress Concern Present   Feeling of Stress : Not at all  Social Connections: Socially Integrated   Frequency of Communication with Friends and Family: Three times a week   Frequency of Social Gatherings with Friends and Family: Three times a week   Attends Religious Services: More than 4 times per year   Active Member of Clubs or Organizations: Yes   Attends Archivist Meetings: Never   Marital Status: Married    Tobacco Counseling Ready to quit: Not Answered Counseling given: Not Answered   Clinical  Intake:  Pre-visit preparation completed: Yes  Pain : No/denies pain     Nutritional Risks: None Diabetes: No  How often do you need to have someone help you when you read instructions, pamphlets, or other written materials from your doctor or pharmacy?: 1 - Never  Diabetic?  NO  Interpreter Needed?: No  Information entered by :: Leroy Kennedy LPN   Activities of Daily Living In your present state of health, do you have any difficulty performing the following activities: 02/20/2021  Hearing? Y  Vision? N  Difficulty concentrating or making decisions? N  Walking or climbing stairs? Y  Comment knee pain  Dressing or bathing? N  Doing errands, shopping? N  Preparing Food and eating ? N  Using the Toilet? N  In the past six months, have you accidently leaked urine? N  Do you have problems with loss of bowel control? N  Managing your Medications? N  Managing your Finances? N  Housekeeping or managing your Housekeeping? N  Some recent data might be hidden    Patient Care Team: Ronnald Nian, DO as PCP - General (Family Medicine)  Indicate any recent Medical Services you may have received from other than [facility] providers in the past year (date may be approximate).     Assessment:   This is a routine wellness examination for Bryan Brock.  Hearing/Vision screen Hearing Screening - Comments:: Some hearing loss  Never been tested  Vision Screening - Comments:: Not up to date Will call to schedule   Dietary issues and exercise activities discussed: Current Exercise Habits: Home exercise routine;Structured exercise class, Type of exercise: walking, Time (Minutes): 60, Frequency (Times/Week): 3, Weekly Exercise (Minutes/Week): 180, Intensity: Intense   Goals Addressed             This Visit's Progress    Weight (lb) < 200 lb (90.7 kg)       Loose 45 lbs        Depression Screen PHQ 2/9 Scores 02/20/2021 05/11/2020 05/11/2020  PHQ - 2 Score 0 0 0  PHQ- 9 Score - 0 -     Fall Risk Fall Risk  02/20/2021 05/11/2020  Falls in the past year? 0 0  Number falls in past yr: 0 0  Injury with Fall? 0 0  Follow up Falls evaluation completed;Falls prevention discussed -    FALL RISK PREVENTION PERTAINING TO THE HOME:  Any stairs in or around the home? No  If so, are there any without handrails? No  Home free of loose throw rugs in walkways, pet beds, electrical cords, etc? Yes  Adequate lighting in your home to reduce risk of falls? Yes   ASSISTIVE DEVICES UTILIZED TO PREVENT FALLS:  Life alert?  No  Use of a cane, walker or w/c? No  Grab bars in the bathroom? No  Shower chair or bench in shower? No  Elevated toilet seat or a handicapped toilet? No   TIMED UP AND GO:  Was the test performed? No .  Tele-Health visit    Cognitive Function:  Normal cognitive status assessed by direct observation by this Nurse Health Advisor. No abnormalities found.          Immunizations Immunization History  Administered Date(s) Administered   PFIZER(Purple Top)SARS-COV-2 Vaccination 04/06/2020    TDAP status: Due, Education has been provided regarding the importance of this vaccine. Advised may receive this vaccine at local pharmacy or Health Dept. Aware to provide a copy of the vaccination record if obtained from local pharmacy or Health Dept. Verbalized acceptance and understanding.  Flu Vaccine status: Declined, Education has been provided regarding the importance of this vaccine but patient still declined. Advised may receive this vaccine at local pharmacy or Health Dept. Aware to provide a copy of the vaccination record if obtained from local pharmacy or Health Dept. Verbalized acceptance and understanding.  Pneumococcal vaccine status: Due, Education has been provided regarding the importance of this vaccine. Advised may receive this vaccine at local pharmacy or Health Dept. Aware to provide a copy of the vaccination record if obtained from local pharmacy  or Health Dept. Verbalized acceptance and understanding.  Covid-19 vaccine status: Information provided on how to obtain vaccines.   Qualifies for Shingles Vaccine? Yes   Zostavax completed No   Shingrix Completed?: No.    Education has been provided regarding the importance of this vaccine. Patient has been advised to call insurance company to determine out of pocket expense if they have not yet received this vaccine. Advised may also receive vaccine at local pharmacy or Health Dept. Verbalized acceptance and understanding.  Screening Tests Health Maintenance  Topic Date Due   Hepatitis C Screening  Never done   TETANUS/TDAP  Never done   Zoster Vaccines- Shingrix (1 of 2) Never done   COLONOSCOPY (Pts 45-43yrs Insurance coverage will need to be confirmed)  Never done   PNA vac Low Risk Adult (1 of 2 - PCV13) Never done   COVID-19 Vaccine (2 - Pfizer risk series) 04/27/2020   INFLUENZA VACCINE  03/26/2021   HPV VACCINES  Aged Out    Health Maintenance  Health Maintenance Due  Topic Date Due   Hepatitis C Screening  Never done   TETANUS/TDAP  Never done   Zoster Vaccines- Shingrix (1 of 2) Never done   COLONOSCOPY (Pts 45-55yrs Insurance coverage will need to be confirmed)  Never done   PNA vac Low Risk Adult (1 of 2 - PCV13) Never done   COVID-19 Vaccine (2 - Pfizer risk series) 04/27/2020    Colorectal cancer screening: Referral to GI placed  . Pt aware the office will call re: appt.  Lung Cancer Screening: (Low Dose CT Chest recommended if Age 51-80 years, 30 pack-year currently smoking OR have quit w/in 15years.) does not qualify.   Lung Cancer Screening Referral: na  Additional Screening:  Hepatitis C Screening: does qualify; Completed  Vision Screening: Recommended annual ophthalmology exams for early detection of glaucoma and other disorders of the eye. Is the patient up to date with their annual eye exam?  No  Who is the provider or what is the name of the office  in which the patient attends annual eye exams?  If pt is not established  with a provider, would they like to be referred to a provider to establish care? No .   Dental Screening: Recommended annual dental exams for proper oral hygiene  Community Resource Referral / Chronic Care Management: CRR required this visit?  No   CCM required this visit?  No      Plan:     I have personally reviewed and noted the following in the patient's chart:   Medical and social history Use of alcohol, tobacco or illicit drugs  Current medications and supplements including opioid prescriptions. Patient is not currently taking opioid prescriptions. Functional ability and status Nutritional status Physical activity Advanced directives List of other physicians Hospitalizations, surgeries, and ER visits in previous 12 months Vitals Screenings to include cognitive, depression, and falls Referrals and appointments  In addition, I have reviewed and discussed with patient certain preventive protocols, quality metrics, and best practice recommendations. A written personalized care plan for preventive services as well as general preventive health recommendations were provided to patient.     Leroy Kennedy, LPN   7/53/0104   Nurse Notes: na

## 2021-03-06 ENCOUNTER — Encounter: Payer: Self-pay | Admitting: Family Medicine

## 2021-08-22 ENCOUNTER — Encounter: Payer: Self-pay | Admitting: Family Medicine

## 2021-08-22 ENCOUNTER — Ambulatory Visit (INDEPENDENT_AMBULATORY_CARE_PROVIDER_SITE_OTHER): Payer: Medicare Other | Admitting: Family Medicine

## 2021-08-22 ENCOUNTER — Other Ambulatory Visit: Payer: Self-pay

## 2021-08-22 VITALS — BP 142/86 | HR 90 | Temp 97.1°F | Ht 71.5 in | Wt 307.0 lb

## 2021-08-22 DIAGNOSIS — N5089 Other specified disorders of the male genital organs: Secondary | ICD-10-CM | POA: Diagnosis not present

## 2021-08-22 NOTE — Progress Notes (Signed)
[facility] [address] [place] [address] Dept: [phone] Dept Fax: [phone]  Office Visit  Subjective:    Patient ID: Bryan Brock, male    DOB: Dec 18, 1955, 65 y.o..   MRN: [id number]  Chief Complaint  Patient presents with   Acute Visit    C/o having swelling in testicles/scrotum x 2 weeks.     History of Present Illness:  Patient is in today for evaluation of an enlargement of his scrotum/testicles over the past 2 weeks. He has not had similar issues int he past. He feels like he can feel some tissue present. He denies any associated pain, has had no fever, and has seen no blood in his urine or semen. He has been able to engage in intercourse without troubles.He has a large umbilical hernia, but denies any excessive straining beyond the norm. He does do part-time work at a [employer], so does do some lifting.  Past Medical History: Patient Active Problem List   Diagnosis Date Noted   Bipolar 1 disorder, depressed ([place]) 05/11/2020   CARCINOMA, BASAL CELL, BACK 11/05/2007   Hyperlipidemia 11/05/2007   ANXIETY DEPRESSION 11/05/2007   PREMATURE VENTRICULAR CONTRACTIONS 11/05/2007   PRURITUS, EARS 11/05/2007   CYSTIC ACNE 11/05/2007   MITRAL VALVE PROLAPSE, HX OF 11/05/2007   Past Surgical History:  Procedure Laterality Date   MOHS SURGERY  1990   SQUINT REPAIR Left    TONSILLECTOMY AND ADENOIDECTOMY     Family History  Problem Relation Age of Onset   Heart disease Mother    Diabetes Mother    Obesity Mother    Hyperlipidemia Mother    Cancer Father        skin cancer   Heart disease Father    Hyperlipidemia Father    Outpatient Medications Prior to Visit  Medication Sig Dispense Refill   Ascorbic Acid (VITAMIN C) 1000 MG tablet Take 2,000 mg by mouth daily.     cholecalciferol (VITAMIN D3) 25 MCG (1000 UNIT) tablet Take 1,000 Units by mouth daily.     co-enzyme Q-10 30 MG capsule Take 30 mg by  mouth 3 (three) times daily.     Flaxseed, Linseed, (FLAX SEED OIL PO) Take by mouth.     folic acid (FOLVITE) 751 MCG tablet Take 400 mcg by mouth daily.     lamoTRIgine (LAMICTAL) 100 MG tablet Take 100 mg by mouth daily.     magnesium oxide (MAG-OX) 400 MG tablet Take 400 mg by mouth daily.     Multiple Vitamins-Minerals (MULTIVITAMIN WITH MINERALS) tablet Take 1 tablet by mouth daily.     Omega 3 1000 MG CAPS Take by mouth.     zinc gluconate 50 MG tablet Take 50 mg by mouth daily.     No facility-administered medications prior to visit.   No Known Allergies    Objective:   Today's Vitals   08/22/21 0801  BP: (!) 142/86  Pulse: 90  Temp: (!) 97.1 F (36.2 C)  TempSrc: Temporal  SpO2: 98%  Weight: (!) 307 lb (139.3 kg)  Height: 5' 11.5" (1.816 m)   Body mass index is 42.22 kg/m.   General: Well developed, well nourished. No acute distress. GU: The scrotum is visibly quite large, L>R. The entire scrotal contents feels like a tense water balloon. It is   non-tender. There is no sign of an inguinal hernia and no palpable varices. Psych: Alert and oriented. Normal mood and affect.  Health  Maintenance Due  Topic Date Due   Pneumonia Vaccine 66+ Years old (1 - PCV) Never done   Hepatitis C Screening  Never done   TETANUS/TDAP  Never done   Zoster Vaccines- Shingrix (1 of 2) Never done   COLONOSCOPY (Pts 45-42yrs Insurance coverage will need to be confirmed)  Never done   COVID-19 Vaccine (2 - Pfizer risk series) 04/27/2020   INFLUENZA VACCINE  Never done     Assessment & Plan:   1. 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 I will order a scrotal ultrasound to assess these. As there is no pain or fever, I do not believe there is anything infectious involved and do not see a need for antibiotics. I will follow-up with him based on his test results.  - US Scrotum; Future  Haydee Salter, MD

## 2021-08-24 ENCOUNTER — Other Ambulatory Visit: Payer: Self-pay

## 2021-08-24 ENCOUNTER — Ambulatory Visit (HOSPITAL_BASED_OUTPATIENT_CLINIC_OR_DEPARTMENT_OTHER)
Admission: RE | Admit: 2021-08-24 | Discharge: 2021-08-24 | Disposition: A | Discharge status: Home / Self Care | Payer: Medicare Other | Source: Ambulatory Visit | Attending: Family Medicine | Admitting: Family Medicine

## 2021-08-24 DIAGNOSIS — N5089 Other specified disorders of the male genital organs: Secondary | ICD-10-CM | POA: Insufficient documentation

## 2021-08-28 ENCOUNTER — Encounter: Payer: Self-pay | Admitting: Family Medicine

## 2021-08-28 DIAGNOSIS — N433 Hydrocele, unspecified: Secondary | ICD-10-CM | POA: Insufficient documentation

## 2021-08-30 ENCOUNTER — Telehealth: Payer: Self-pay | Admitting: Family Medicine

## 2021-08-30 DIAGNOSIS — N433 Hydrocele, unspecified: Secondary | ICD-10-CM

## 2021-10-12 ENCOUNTER — Telehealth: Payer: Self-pay | Admitting: Family Medicine

## 2021-10-12 NOTE — Telephone Encounter (Signed)
My chart message sent to him due to he is unable to talk on the phone while at work.  Dm/cma

## 2021-10-12 NOTE — Telephone Encounter (Signed)
Patient needs a phone call to help him fill out some information for his surgery approval from Dr Gena Fray. He is on a time constraint. He is available to talk until 3:45 today.

## 2021-10-12 NOTE — Telephone Encounter (Signed)
Spoke to provider and he will need an appointment for the surgery clearance.  I will call him and advise once again.  Dm/cma

## 2021-10-15 NOTE — Telephone Encounter (Signed)
Patient scheduled 10/17/21 @ 4:00 pm.   Dm/cma

## 2021-10-16 ENCOUNTER — Other Ambulatory Visit: Payer: Self-pay

## 2021-10-17 ENCOUNTER — Encounter: Payer: Self-pay | Admitting: Family Medicine

## 2021-10-17 ENCOUNTER — Ambulatory Visit (INDEPENDENT_AMBULATORY_CARE_PROVIDER_SITE_OTHER): Payer: Medicare Other | Admitting: Family Medicine

## 2021-10-17 VITALS — BP 180/90 | HR 94 | Temp 98.0°F | Ht 71.5 in | Wt 302.2 lb

## 2021-10-17 DIAGNOSIS — I10 Essential (primary) hypertension: Secondary | ICD-10-CM

## 2021-10-17 DIAGNOSIS — R600 Localized edema: Secondary | ICD-10-CM

## 2021-10-17 DIAGNOSIS — N433 Hydrocele, unspecified: Secondary | ICD-10-CM

## 2021-10-17 DIAGNOSIS — R7303 Prediabetes: Secondary | ICD-10-CM | POA: Diagnosis not present

## 2021-10-17 DIAGNOSIS — E785 Hyperlipidemia, unspecified: Secondary | ICD-10-CM

## 2021-10-17 DIAGNOSIS — Z8679 Personal history of other diseases of the circulatory system: Secondary | ICD-10-CM

## 2021-10-17 DIAGNOSIS — Z01818 Encounter for other preprocedural examination: Secondary | ICD-10-CM

## 2021-10-17 DIAGNOSIS — R9431 Abnormal electrocardiogram [ECG] [EKG]: Secondary | ICD-10-CM

## 2021-10-17 DIAGNOSIS — Z6841 Body Mass Index (BMI) 40.0 and over, adult: Secondary | ICD-10-CM

## 2021-10-17 NOTE — Progress Notes (Signed)
[facility] [address] [place] [address] Dept: [phone] Dept Fax: [phone]  Pre-operative Visit  Subjective:    Patient ID: Bryan Brock, male    DOB: 1956-04-27, 66 y.o..   MRN: [id number]  Chief Complaint  Patient presents with   Pre-op Exam    Surgery clearance for Prostrate surgery.      History of Present Illness:  Patient is in today for pre-operative clearance for a bilateral hydrocele excision at the request of Dr. Arnette Schaumann. Bryan Brock presented in Dec. with bilateral enlargement of his scrotum in Dec. He was subsequently diagnosed with hydroceles and referred to Dr. Claudia Desanctis (urology). He has elected to proceed with surgery to remove these as he finds them bothersome.  Bryan Brock has a history of prior PVCs and mitral valve prolapse. Apparently, he had episodes of symptomatic PVCs and with periodic flushing attributed to the MVP over a period of years. He was previously followed by cardiology and his internist and had gradual resolution of the MVP. He now notes only occasional episodes of an irregular heart beat (4-5 in the past year), though this is associated with mild dyspnea at times. Bryan Brock admits to some pedal edema, but had attributed this to his obesity. This has been present for some time now. He has also been noted to have some elevated blood pressures at times, though he notes they are asymptomatic.  Past Medical History: Patient Active Problem List   Diagnosis Date Noted   Prediabetes 10/17/2021   Hydrocele, bilateral 08/28/2021   Bipolar 1 disorder, depressed ([place]) 05/11/2020   CARCINOMA, BASAL CELL, BACK 11/05/2007   Borderline hyperlipidemia 11/05/2007   ANXIETY DEPRESSION 11/05/2007   Premature ventricular contractions 11/05/2007   History of mitral valve prolapse 11/05/2007   Past Surgical History:  Procedure Laterality Date   MOHS SURGERY  1990   SQUINT REPAIR Left     TONSILLECTOMY AND ADENOIDECTOMY     Family History  Problem Relation Age of Onset   Heart disease Mother    Diabetes Mother    Obesity Mother    Hyperlipidemia Mother    Cancer Father        skin cancer   Heart disease Father    Hyperlipidemia Father    Outpatient Medications Prior to Visit  Medication Sig Dispense Refill   Ascorbic Acid (VITAMIN C) 1000 MG tablet Take 2,000 mg by mouth daily.     cholecalciferol (VITAMIN D3) 25 MCG (1000 UNIT) tablet Take 1,000 Units by mouth daily.     co-enzyme Q-10 30 MG capsule Take 30 mg by mouth 3 (three) times daily.     Flaxseed, Linseed, (FLAX SEED OIL PO) Take by mouth.     folic acid (FOLVITE) 370 MCG tablet Take 400 mcg by mouth daily.     lamoTRIgine (LAMICTAL) 100 MG tablet Take 100 mg by mouth daily.     magnesium oxide (MAG-OX) 400 MG tablet Take 400 mg by mouth daily.     Multiple Vitamins-Minerals (MULTIVITAMIN WITH MINERALS) tablet Take 1 tablet by mouth daily.     Omega 3 1000 MG CAPS Take by mouth.     OVER THE COUNTER MEDICATION Boostaro supplement     zinc gluconate 50 MG tablet Take 50 mg by mouth daily.     No facility-administered medications prior to visit.   No Known Allergies    Objective:   Today's Vitals   10/17/21 1603 10/17/21 1615  BP: Marland Kitchen)  180/100 (!) 180/90  Pulse: 94   Temp: 98 F (36.7 C)   TempSrc: Temporal   SpO2: 96%   Weight: (!) 302 lb 3.2 oz (137.1 kg)   Height: 5' 11.5" (1.816 m)    Body mass index is 41.56 kg/m.   General: Well developed, well nourished. No acute distress. Neck: Supple. No lymphadenopathy. No thyromegaly. No JVD Lungs: Clear to auscultation bilaterally. No wheezing, rales or rhonchi. CV: RRR without murmurs or rubs. Pulses 2+ bilaterally. Extremities: 2-3+ pitting edema. Psych: Alert and oriented. Normal mood and affect.  Health Maintenance Due  Topic Date Due   Hepatitis C Screening  Never done   TETANUS/TDAP  Never done   Zoster Vaccines- Shingrix (1 of 2) Never  done   COLONOSCOPY (Pts 45-50yrs Insurance coverage will need to be confirmed)  Never done   Pneumonia Vaccine 44+ Years old (1 - PCV) Never done   EKG: Sinus rhythm (rate= 85). Possible left atrial enlargement. Right axis deviation and low voltage in limb leads. Possible RVH vs. posterior fascicular block.   Assessment & Plan:   1. Pre-op exam I am unable to clear Bryan Brock for surgery without further evaluation.  He has significant hypertension, pedal edema, and an abnormal EKG. I feel he needs cardiac evaluation to assess for heart failure. I will start by checking screening labs and refer him to cardiology.  - EKG 12-Lead - Comprehensive metabolic panel - CBC with Differential/Platelet - Ambulatory referral to Cardiology  2. Essential hypertension Stage 2 hypertension present. I will check labs to assess for co-morbidities or complications. I will wait cardiology assessment prior to initiating an antihypertensive.  - EKG 12-Lead - Comprehensive metabolic panel - Ambulatory referral to Cardiology  3. Hydrocele, bilateral Surgery pending.  4. Borderline hyperlipidemia Will reassess lipids.  - Lipid panel  5. History of mitral valve prolapse By history this has resolved. Likely will need a repeat echo, which can reassess this.  - Ambulatory referral to Cardiology  6. Abnormal EKG As noted above, patient has EKG evidence of possible LAE and RVH and pedal edema. I am concerned for possible heart failure.  - Ambulatory referral to Cardiology  7. Pedal edema I will check screening labs to assess for potential etiologies of this.  - TSH - T4, free - B Nat Peptide - Ambulatory referral to Cardiology  8. Prediabetes Last A1c was in prediabetes range. I will reassess.  - Hemoglobin A1c  Haydee Salter, MD

## 2021-10-18 ENCOUNTER — Encounter: Payer: Self-pay | Admitting: Family Medicine

## 2021-10-18 DIAGNOSIS — R7401 Elevation of levels of liver transaminase levels: Secondary | ICD-10-CM | POA: Insufficient documentation

## 2021-10-18 LAB — LIPID PANEL
Cholesterol: 208 mg/dL — ABNORMAL HIGH (ref 0–200)
HDL: 53.5 mg/dL (ref >39.00)
LDL Cholesterol: 132 mg/dL — ABNORMAL HIGH (ref 0–99)
NonHDL: 154.59
Total CHOL/HDL Ratio: 4
Triglycerides: 111 mg/dL (ref 0.0–149.0)
VLDL: 22.2 mg/dL (ref 0.0–40.0)

## 2021-10-18 LAB — CBC WITH DIFFERENTIAL/PLATELET
Basophils Absolute: 0.1 10*3/uL (ref 0.0–0.1)
Basophils Relative: 1.3 % (ref 0.0–3.0)
Eosinophils Absolute: 0.2 10*3/uL (ref 0.0–0.7)
Eosinophils Relative: 3.6 % (ref 0.0–5.0)
HCT: 44.3 % (ref 39.0–52.0)
Hemoglobin: 14.7 g/dL (ref 13.0–17.0)
Lymphocytes Relative: 23.7 % (ref 12.0–46.0)
Lymphs Abs: 1.1 10*3/uL (ref 0.7–4.0)
MCHC: 33.2 g/dL (ref 30.0–36.0)
MCV: 92.5 fl (ref 78.0–100.0)
Monocytes Absolute: 0.4 10*3/uL (ref 0.1–1.0)
Monocytes Relative: 8.5 % (ref 3.0–12.0)
Neutro Abs: 2.9 10*3/uL (ref 1.4–7.7)
Neutrophils Relative %: 62.9 % (ref 43.0–77.0)
Platelets: 165 10*3/uL (ref 150.0–400.0)
RBC: 4.79 Mil/uL (ref 4.22–5.81)
RDW: 13.2 % (ref 11.5–15.5)
WBC: 4.6 10*3/uL (ref 4.0–10.5)

## 2021-10-18 LAB — BRAIN NATRIURETIC PEPTIDE: Pro B Natriuretic peptide (BNP): 169 pg/mL — ABNORMAL HIGH (ref 0.0–100.0)

## 2021-10-18 LAB — COMPREHENSIVE METABOLIC PANEL
ALT: 69 U/L — ABNORMAL HIGH (ref 0–53)
AST: 43 U/L — ABNORMAL HIGH (ref 0–37)
Albumin: 4.5 g/dL (ref 3.5–5.2)
Alkaline Phosphatase: 82 U/L (ref 39–117)
BUN: 23 mg/dL (ref 6–23)
CO2: 27 mEq/L (ref 19–32)
Calcium: 9.1 mg/dL (ref 8.4–10.5)
Chloride: 106 mEq/L (ref 96–112)
Creatinine, Ser: 1.08 mg/dL (ref 0.40–1.50)
GFR: 71.39 mL/min (ref >60.00)
Glucose, Bld: 98 mg/dL (ref 70–99)
Potassium: 4.2 mEq/L (ref 3.5–5.1)
Sodium: 139 mEq/L (ref 135–145)
Total Bilirubin: 0.6 mg/dL (ref 0.2–1.2)
Total Protein: 6.9 g/dL (ref 6.0–8.3)

## 2021-10-18 LAB — T4, FREE: Free T4: 0.79 ng/dL (ref 0.60–1.60)

## 2021-10-18 LAB — HEMOGLOBIN A1C: Hgb A1c MFr Bld: 6.3 % (ref 4.6–6.5)

## 2021-10-18 LAB — TSH: TSH: 1.4 u[IU]/mL (ref 0.35–5.50)

## 2021-10-26 ENCOUNTER — Other Ambulatory Visit: Payer: Self-pay

## 2021-10-29 ENCOUNTER — Encounter: Payer: Self-pay | Admitting: Family Medicine

## 2021-10-29 ENCOUNTER — Other Ambulatory Visit: Payer: Self-pay

## 2021-10-29 ENCOUNTER — Ambulatory Visit (INDEPENDENT_AMBULATORY_CARE_PROVIDER_SITE_OTHER): Payer: Medicare Other | Admitting: Family Medicine

## 2021-10-29 VITALS — BP 162/94 | HR 90 | Temp 97.9°F | Ht 71.5 in | Wt 305.0 lb

## 2021-10-29 DIAGNOSIS — E785 Hyperlipidemia, unspecified: Secondary | ICD-10-CM | POA: Diagnosis not present

## 2021-10-29 DIAGNOSIS — I10 Essential (primary) hypertension: Secondary | ICD-10-CM | POA: Diagnosis not present

## 2021-10-29 DIAGNOSIS — N433 Hydrocele, unspecified: Secondary | ICD-10-CM

## 2021-10-29 DIAGNOSIS — Z6841 Body Mass Index (BMI) 40.0 and over, adult: Secondary | ICD-10-CM

## 2021-10-29 DIAGNOSIS — R7303 Prediabetes: Secondary | ICD-10-CM

## 2021-10-29 DIAGNOSIS — Z23 Encounter for immunization: Secondary | ICD-10-CM

## 2021-10-29 DIAGNOSIS — R7401 Elevation of levels of liver transaminase levels: Secondary | ICD-10-CM

## 2021-10-29 MED ORDER — LISINOPRIL-HYDROCHLOROTHIAZIDE 10-12.5 MG PO TABS: 1.0000 | ORAL_TABLET | Freq: Every day | ORAL | 3 refills | Status: DC

## 2021-10-29 NOTE — Progress Notes (Signed)
?[facility] ?Bryan Brock ?[place] [address] ?Dept: [phone] ?Dept Fax: [phone] ? ?Transfer of Care Office Visit ? ?Subjective:  ? ? Patient ID: Bryan Brock, male    DOB: September 05, 1955, 66 y.o..   MRN: [id number] ? ?Chief Complaint  ?Patient presents with  ? Establish Care  ?  TOC-  establish care.     ? ? ?History of Present Illness: ? ?Patient is in today to establish care.Bryan Brock was born in [place], [place]. His family moved quite a bit as he grew up ([place], [place], [place], [place], [place], [place], [place], [place]). 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 He was married for 39 years and has two children (daughter- 83, son-31). After his divorce, his wife and kids moved to [place]. He remains estranged with his daughter. He has a SO, but she has an aversion to marriage. Mr. Olenik currently works part-time at a Corporate treasure[employer]. He denies smoking, but does use some smokeless tobacco. ? ?I had recently seen Bryan Brock for a preoperative exam. He has a bilateral hydrocele which he is electing to have surgery for. ? ?Bryan Brock has a history of prior PVCs and mitral valve prolapse. Apparently, he had episodes of symptomatic PVCs and with periodic flushing attributed to the MVP over a period of years. He was previously followed by cardiology and his internist and had gradual resolution of the MVP. He now notes only occasional episodes of an irregular heart beat (4-5 in the past year), though this is associated with mild dyspnea at times. Bryan Brock admits to some pedal edema, but had attributed this to his obesity. He is scheduled to see the cardiologist next week. ? ?Bryan Brock has a history of elevated blood pressure, but had not been diagnosed with hypertension. ? ?Bryan Brock has a history of Bipolar disorder. He is currently managed by Bryan Brock and is on lamotrigine. ? ?Bryan Brock has a  history of erectile dysfunction. He is managed on sildenafil. ? ?Past Medical History: ?Patient Active Problem List  ? Diagnosis Date Noted  ? Essential hypertension 10/29/2021  ? Elevated transaminase level 10/18/2021  ? Prediabetes 10/17/2021  ? Morbid obesity with BMI of 40.0-44.9, adult ([place]) 10/17/2021  ? Hydrocele, bilateral 08/28/2021  ? Bipolar 1 disorder, depressed ([place]) 05/11/2020  ? History of basal cell carcinoma 11/05/2007  ? Borderline hyperlipidemia 11/05/2007  ? Premature ventricular contractions 11/05/2007  ? History of mitral valve prolapse 11/05/2007  ? ?Past Surgical History:  ?Procedure Laterality Date  ? [place]  ? STRABISMUS SURGERY Left   ? TONSILLECTOMY AND ADENOIDECTOMY    ? ?Family History  ?Problem Relation Age of Onset  ? Heart disease Mother   ? Diabetes Mother   ? Obesity Mother   ? Hyperlipidemia Mother   ? Cancer Father   ?     skin cancer  ? Heart disease Father   ? Hyperlipidemia Father   ? ?Outpatient Medications Prior to Visit  ?Medication Sig Dispense Refill  ? Ascorbic Acid (VITAMIN C) 1000 MG tablet Take 2,000 mg by mouth daily.    ? cholecalciferol (VITAMIN D3) 25 MCG (1000 UNIT) tablet Take 1,000 Units by mouth daily.    ? co-enzyme Q-10 30 MG capsule Take 30 mg by mouth 3 (three) times daily.    ? Flaxseed, Linseed, (FLAX SEED OIL PO) Take by mouth.    ?  folic acid (FOLVITE) 176 MCG tablet Take 400 mcg by mouth daily.    ? lamoTRIgine (LAMICTAL) 100 MG tablet Take 100 mg by mouth daily.    ? magnesium oxide (MAG-OX) 400 MG tablet Take 400 mg by mouth daily.    ? Multiple Vitamins-Minerals (MULTIVITAMIN WITH MINERALS) tablet Take 1 tablet by mouth daily.    ? Omega 3 1000 MG CAPS Take by mouth.    ? OVER THE COUNTER MEDICATION Boostaro supplement    ? sildenafil (VIAGRA) 50 MG tablet Take 50 mg by mouth daily as needed for erectile dysfunction.    ? zinc gluconate 50 MG tablet Take 50 mg by mouth daily.    ? ?No facility-administered medications prior to visit.   ? ?No Known Allergies ?   ?Objective:  ? ?Today's Vitals  ? 10/29/21 1058  ?BP: (!) 162/94  ?Pulse: 90  ?Temp: 97.9 ?F (36.6 ?C)  ?TempSrc: Temporal  ?SpO2: 98%  ?Weight: (!) 305 Bryan Brock (138.3 kg)  ?Height: 5' 11.5" (1.816 m)  ? ?Body mass index is 41.95 kg/m?.  ? ?General: Well developed, well nourished. No acute distress. ?Psych: Alert and oriented. Normal mood and affect. ? ?Health Maintenance Due  ?Topic Date Due  ? Hepatitis C Screening  Never done  ? TETANUS/TDAP  Never done  ? Zoster Vaccines- Shingrix (1 of 2) Never done  ? COLONOSCOPY (Pts 45-67yr Insurance coverage will need to be confirmed)  Never done  ? Pneumonia Vaccine 67 Years old (1 - PCV) Never done  ? ?Lab Results ?Last CBC ?Lab Results  ?Component Value Date  ? WBC 4.6 10/17/2021  ? HGB 14.7 10/17/2021  ? HCT 44.3 10/17/2021  ? MCV 92.5 10/17/2021  ? RDW 13.2 10/17/2021  ? PLT 165.0 10/17/2021  ? ?Last metabolic panel ?Lab Results  ?Component Value Date  ? GLUCOSE 98 10/17/2021  ? NA 139 10/17/2021  ? K 4.2 10/17/2021  ? CL 106 10/17/2021  ? CO2 27 10/17/2021  ? BUN 23 10/17/2021  ? CREATININE 1.08 10/17/2021  ? CALCIUM 9.1 10/17/2021  ? PROT 6.9 10/17/2021  ? ALBUMIN 4.5 10/17/2021  ? BILITOT 0.6 10/17/2021  ? ALKPHOS 82 10/17/2021  ? AST 43 (H) 10/17/2021  ? ALT 69 (H) 10/17/2021  ? ?Last lipids ?Lab Results  ?Component Value Date  ? CHOL 208 (H) 10/17/2021  ? HDL 53.50 10/17/2021  ? LDLCALC 132 (H) 10/17/2021  ? TRIG 111.0 10/17/2021  ? CHOLHDL 4 10/17/2021  ? ?Last hemoglobin A1c ?Lab Results  ?Component Value Date  ? HGBA1C 6.3 10/17/2021  ? ?Last thyroid functions ?Lab Results  ?Component Value Date  ? TSH 1.40 10/17/2021  ? ?Component Ref Range & Units 12 d ago  ?Pro B Natriuretic peptide (BNP) 0.0 - 100.0 pg/mL 169.0 High    ? ?Assessment & Plan:  ? ?1. Essential hypertension ?Blood pressure remains elevated today. I will plan to start him on Zestoretic. I will recheck this in 3 months. ? ?- lisinopril-hydrochlorothiazide (ZESTORETIC)  10-12.5 MG tablet; Take 1 tablet by mouth daily.  Dispense: 90 tablet; Refill: 3 ? ?2. Hydrocele, bilateral ?Pending for surgical correction. If cleared by cardiology, he will proceed with surgical excision. ? ?3. Borderline hyperlipidemia ?Will await cardiology evaluation. However, I suspect he needs ot be started on a statin to reduce heart disease risk. ? ?4. Prediabetes ?Recommend 10% weight loss to reduce risk of progression. ? ?5. Morbid obesity with BMI of 40.0-44.9, adult ([place] ?Recommend regular exercise (after cardiology clearance) and  weight loss. ? ?6. Elevated transaminase level ?Possibly due to fatty liver. Will plan to reassess sin 3 months. If still elevated, will follow up with an ultrasound. ? ?Return in about 3 months (around 01/29/2022) for Reassessment.  ? ?Haydee Salter, MD ?

## 2021-11-01 NOTE — Progress Notes (Deleted)
Cardiology Office Note:    Date:  11/01/2021   ID:  Bryan Brock, DOB 1955-10-04, MRN [id number]  PCP:  Loyola Mast, MD   [facility] Providers Cardiologist:  None {   Referring MD: Loyola Mast, MD    History of Present Illness:    Bryan Brock is a 66 y.o. male with a hx of MVP, symptomatic PVCs, HTN, HLD and morbid obesity who was referred to Cardiology for MVP.  Today, ***  Past Medical History:  Diagnosis Date   Acne    Bipolar 1 disorder (HCC)    Depression    Panic attacks     Past Surgical History:  Procedure Laterality Date   MOHS SURGERY  1990   STRABISMUS SURGERY Left    TONSILLECTOMY AND ADENOIDECTOMY      Current Medications: No outpatient medications have been marked as taking for the 11/08/21 encounter (Appointment) with Meriam Sprague, MD.     Allergies:   Patient has no known allergies.   Social History   Socioeconomic History   Marital status: Media planner    Spouse name: Not on file   Number of children: 2   Years of education: Not on file   Highest education level: Not on file  Occupational History   Occupation: Horticulturist, commercial    Comment: Part-Time  Tobacco Use   Smoking status: Never   Smokeless tobacco: Current    Types: Chew  Vaping Use   Vaping Use: Never used  Substance and Sexual Activity   Alcohol use: Yes    Comment: occasionanly   Drug use: Never   Sexual activity: Yes  Other Topics Concern   Not on file  Social History Narrative   Not on file   Social Determinants of Health   Financial Resource Strain: Low Risk    Difficulty of Paying Living Expenses: Not hard at all  Food Insecurity: No Food Insecurity   Worried About Programme researcher, broadcasting/film/video in the Last Year: Never true   Ran Out of Food in the Last Year: Never true  Transportation Needs: No Transportation Needs   Lack of Transportation (Medical): No   Lack of Transportation (Non-Medical): No  Physical Activity: Sufficiently Active   Days  of Exercise per Week: 3 days   Minutes of Exercise per Session: 60 min  Stress: No Stress Concern Present   Feeling of Stress : Not at all  Social Connections: Socially Integrated   Frequency of Communication with Friends and Family: Three times a week   Frequency of Social Gatherings with Friends and Family: Three times a week   Attends Religious Services: More than 4 times per year   Active Member of Clubs or Organizations: Yes   Attends Banker Meetings: Never   Marital Status: Married     Family History: The patient's ***family history includes Cancer in his father; Diabetes in his mother; Heart disease in his father and mother; Hyperlipidemia in his father and mother; Obesity in his mother.  ROS:   Please see the history of present illness.    *** All other systems reviewed and are negative.  EKGs/Labs/Other Studies Reviewed:    The following studies were reviewed today: ***  EKG:  EKG is *** ordered today.  The ekg ordered today demonstrates ***  Recent Labs: 10/17/2021: ALT 69; BUN 23; Creatinine, Ser 1.08; Hemoglobin 14.7; Platelets 165.0; Potassium 4.2; Pro B Natriuretic peptide (BNP) 169.0; Sodium 139; TSH 1.40  Recent Lipid  Panel    Component Value Date/Time   CHOL 208 (H) 10/17/2021 1705   TRIG 111.0 10/17/2021 1705   HDL 53.50 10/17/2021 1705   CHOLHDL 4 10/17/2021 1705   VLDL 22.2 10/17/2021 1705   LDLCALC 132 (H) 10/17/2021 1705     Risk Assessment/Calculations:   {Does this patient have ATRIAL FIBRILLATION?[phone]}       Physical Exam:    VS:  There were no vitals taken for this visit.    Wt Readings from Last 3 Encounters:  10/29/21 (!) 305 lb (138.3 kg)  10/17/21 (!) 302 lb 3.2 oz (137.1 kg)  08/22/21 (!) 307 lb (139.3 kg)     GEN: *** Well nourished, well developed in no acute distress HEENT: Normal NECK: No JVD; No carotid bruits LYMPHATICS: No lymphadenopathy CARDIAC: ***RRR, no murmurs, rubs, gallops RESPIRATORY:   Clear to auscultation without rales, wheezing or rhonchi  ABDOMEN: Soft, non-tender, non-distended MUSCULOSKELETAL:  No edema; No deformity  SKIN: Warm and dry NEUROLOGIC:  Alert and oriented x 3 PSYCHIATRIC:  Normal affect   ASSESSMENT:    No diagnosis found. PLAN:    In order of problems listed above:  #Mitral Valve Prolapse: -Check TTE  #HTN: -Continue lisinopril-HCTZ 10-12.5mg  daily  #HLD: -Check Ca score  #Morbid Obesity: -Lifestyle modifications -Consider GLP-1 agonist     {Are you ordering a CV Procedure (e.g. stress test, cath, DCCV, TEE, etc)?   Press F2        :[id number]}    Medication Adjustments/Labs and Tests Ordered: Current medicines are reviewed at length with the patient today.  Concerns regarding medicines are outlined above.  No orders of the defined types were placed in this encounter.  No orders of the defined types were placed in this encounter.   There are no Patient Instructions on file for this visit.   Signed, Meriam Sprague, MD  11/01/2021 3:38 PM    [facility]

## 2021-11-08 ENCOUNTER — Ambulatory Visit (INDEPENDENT_AMBULATORY_CARE_PROVIDER_SITE_OTHER): Payer: Medicare Other | Admitting: Cardiology

## 2021-11-08 ENCOUNTER — Encounter: Payer: Self-pay | Admitting: *Deleted

## 2021-11-08 ENCOUNTER — Encounter: Payer: Self-pay | Admitting: Cardiology

## 2021-11-08 ENCOUNTER — Other Ambulatory Visit: Payer: Self-pay

## 2021-11-08 VITALS — BP 160/90 | HR 98 | Ht 71.5 in | Wt 303.4 lb

## 2021-11-08 DIAGNOSIS — Z01818 Encounter for other preprocedural examination: Secondary | ICD-10-CM | POA: Diagnosis not present

## 2021-11-08 DIAGNOSIS — G4733 Obstructive sleep apnea (adult) (pediatric): Secondary | ICD-10-CM | POA: Diagnosis not present

## 2021-11-08 DIAGNOSIS — Z8679 Personal history of other diseases of the circulatory system: Secondary | ICD-10-CM

## 2021-11-08 DIAGNOSIS — Z79899 Other long term (current) drug therapy: Secondary | ICD-10-CM

## 2021-11-08 DIAGNOSIS — R0602 Shortness of breath: Secondary | ICD-10-CM | POA: Diagnosis not present

## 2021-11-08 DIAGNOSIS — E785 Hyperlipidemia, unspecified: Secondary | ICD-10-CM

## 2021-11-08 MED ORDER — ROSUVASTATIN CALCIUM 10 MG PO TABS: 10.0000 mg | ORAL_TABLET | Freq: Every day | ORAL | 2 refills | Status: DC

## 2021-11-08 MED ORDER — LISINOPRIL-HYDROCHLOROTHIAZIDE 20-25 MG PO TABS: 1.0000 | ORAL_TABLET | Freq: Every day | ORAL | 1 refills | Status: DC

## 2021-11-08 NOTE — Progress Notes (Signed)
?Cardiology Office Note:   ? ?Date:  11/08/2021  ? ?ID:  Bryan Brock, DOB 09/08/1955, MRN [id number] ? ?PCP:  Haydee Salter, MD ?  ?[facility] Providers ?Cardiologist:  None { ? ? ?Referring MD: Haydee Salter, MD  ? ? ?History of Present Illness:   ? ?Bryan Brock is a 66 y.o. male with a hx of MVP, symptomatic PVCs, HTN, HLD and morbid obesity who was referred to Cardiology for MVP. ? ?He saw his PCP 10/17/2021 for preoperative clearance for a bilateral hydrocele repair at the request of Dr. Arnette Schaumann. At that visit his blood pressure was 180/100.  His EKG showed possible LAE and RVH. Due to significant hypertension, pedal edema, and abnormal EKG, he was referred to cardiology to assess for heart failure prior to his surgery. ? ?He followed up with his PCP 10/29/2021 and his blood pressure was 162/94. He was started on 10-12.5 mg lisinopril-HCTZ once daily. ? ?Today, the patient states that he was previously followed by Dr. Melvern Banker. He was diagnosed with mild to moderate MVP when he was 61-32 yo. Around that time he had a sudden syncopal episode. He describes a prodrome consisting of "warm rushes," lightheadedness, and arrhythmias. TTE at that time with mild MVP. Holter per report with no significant arrhythmias. He then followed with Dr. Claiborne Billings, but has not been seen in several years. ? ?In the past year, he has had 5 episodes of palpitations or skipped beats. He describes his palpitations as "pronounced", and start suddenly without known triggers. He states he is able to feel this in his cerebral circulation and in his chest. The episodes may last for 30 mins to several hours, and may keep him awake at night. His most recent episode was 2-3 weeks ago. There is usually 2-4 months between episodes. ? ?He is also concerned about his weight. Currently he is at his highest adult weight, 303 lbs in clinic today. He is starting a weight reduction program, and plans to increase his exercise. However, he  endorses shortness of breath and elevated heart rates with minimal exertion. Also, he is limited by issues with his right knee. He works part time, and also Arboriculturist houses. ? ?Patients is unable to completed >4METs due to SOB and joint pain. ? ?He denies any chest pain, or peripheral edema. No headaches, orthopnea, or PND. ? ?In his family, his father had a moderate to severe MVP. ? ?Past Medical History:  ?Diagnosis Date  ? Acne   ? Bipolar 1 disorder ([place])   ? Depression   ? Panic attacks   ? ? ?Past Surgical History:  ?Procedure Laterality Date  ? [place]  ? STRABISMUS SURGERY Left   ? TONSILLECTOMY AND ADENOIDECTOMY    ? ? ?Current Medications: ?Current Meds  ?Medication Sig  ? Ascorbic Acid (VITAMIN C) 1000 MG tablet Take 2,000 mg by mouth daily.  ? cholecalciferol (VITAMIN D3) 25 MCG (1000 UNIT) tablet Take 1,000 Units by mouth daily.  ? co-enzyme Q-10 30 MG capsule Take 30 mg by mouth 3 (three) times daily.  ? Flaxseed, Linseed, (FLAX SEED OIL PO) Take by mouth.  ? folic acid (FOLVITE) 627 MCG tablet Take 400 mcg by mouth daily.  ? lamoTRIgine (LAMICTAL) 100 MG tablet Take 100 mg by mouth daily.  ? lisinopril-hydrochlorothiazide (ZESTORETIC) 20-25 MG tablet Take 1 tablet by mouth daily.  ? magnesium oxide (MAG-OX) 400 MG tablet Take 400 mg by mouth daily.  ? Multiple  Vitamins-Minerals (MULTIVITAMIN WITH MINERALS) tablet Take 1 tablet by mouth daily.  ? naproxen sodium (ALEVE) 220 MG tablet Take 220 mg by mouth daily.  ? Omega 3 1000 MG CAPS Take by mouth.  ? OVER THE COUNTER MEDICATION Boostaro supplement  ? OVER THE COUNTER MEDICATION APPLE CIDER VINEGAR supplement: 1 tablespoon on Mon. Wed. And Fridays.  ? OVER THE COUNTER MEDICATION Nano CBD OIL: Pt takes 20 drops in the morning and 20 drops in the evening.  ? rosuvastatin (CRESTOR) 10 MG tablet Take 1 tablet (10 mg total) by mouth daily.  ? sildenafil (VIAGRA) 50 MG tablet Take 50 mg by mouth daily as needed for erectile dysfunction.  ? zinc  gluconate 50 MG tablet Take 50 mg by mouth daily.  ? [DISCONTINUED] lisinopril-hydrochlorothiazide (ZESTORETIC) 10-12.5 MG tablet Take 1 tablet by mouth daily.  ?  ? ?Allergies:   Patient has no known allergies.  ? ?Social History  ? ?Socioeconomic History  ? Marital status: Soil scientist  ?  Spouse name: Not on file  ? Number of children: 2  ? Years of education: Not on file  ? Highest education level: Not on file  ?Occupational History  ? Occupation: Administrator, Civil Service  ?  Comment: Part-Time  ?Tobacco Use  ? Smoking status: Never  ? Smokeless tobacco: Current  ?  Types: Chew  ?Vaping Use  ? Vaping Use: Never used  ?Substance and Sexual Activity  ? Alcohol use: Yes  ?  Comment: occasionanly  ? Drug use: Never  ? Sexual activity: Yes  ?Other Topics Concern  ? Not on file  ?Social History Narrative  ? Not on file  ? ?Social Determinants of Health  ? ?Financial Resource Strain: Low Risk   ? Difficulty of Paying Living Expenses: Not hard at all  ?Food Insecurity: No Food Insecurity  ? Worried About Charity fundraiser in the Last Year: Never true  ? Ran Out of Food in the Last Year: Never true  ?Transportation Needs: No Transportation Needs  ? Lack of Transportation (Medical): No  ? Lack of Transportation (Non-Medical): No  ?Physical Activity: Sufficiently Active  ? Days of Exercise per Week: 3 days  ? Minutes of Exercise per Session: 60 min  ?Stress: No Stress Concern Present  ? Feeling of Stress : Not at all  ?Social Connections: Socially Integrated  ? Frequency of Communication with Friends and Family: Three times a week  ? Frequency of Social Gatherings with Friends and Family: Three times a week  ? Attends Religious Services: More than 4 times per year  ? Active Member of Clubs or Organizations: Yes  ? Attends Archivist Meetings: Never  ? Marital Status: Married  ?  ? ?Family History: ?The patient's family history includes Cancer in his father; Diabetes in his mother; Heart disease in his father and  mother; Hyperlipidemia in his father and mother; Obesity in his mother. ? ?ROS:   ?Review of Systems  ?Constitutional:  Negative for chills and fever.  ?HENT:  Negative for ear discharge and ear pain.   ?Eyes:  Negative for double vision and discharge.  ?Respiratory:  Positive for shortness of breath. Negative for cough and hemoptysis.   ?Cardiovascular:  Positive for palpitations. Negative for chest pain, orthopnea, claudication, leg swelling and PND.  ?Gastrointestinal:  Negative for diarrhea and vomiting.  ?Genitourinary:  Negative for dysuria.  ?Musculoskeletal:  Positive for joint pain.  ?Neurological:  Negative for tingling and headaches.  ?Endo/Heme/Allergies:  Negative for polydipsia.  ?  Psychiatric/Behavioral:  Negative for hallucinations and suicidal ideas.   ? ? ?EKGs/Labs/Other Studies Reviewed:   ? ?The following studies were reviewed today: ? ?No prior cardiovascular studies available. ? ? ?EKG:  EKG is personally reviewed. ?11/08/2021: Sinus rhythm. Rate 98 bpm. ? ? ?Recent Labs: ?10/17/2021: ALT 69; BUN 23; Creatinine, Ser 1.08; Hemoglobin 14.7; Platelets 165.0; Potassium 4.2; Pro B Natriuretic peptide (BNP) 169.0; Sodium 139; TSH 1.40  ? ?Recent Lipid Panel ?   ?Component Value Date/Time  ? CHOL 208 (H) 10/17/2021 1705  ? TRIG 111.0 10/17/2021 1705  ? HDL 53.50 10/17/2021 1705  ? CHOLHDL 4 10/17/2021 1705  ? VLDL 22.2 10/17/2021 1705  ? LDLCALC 132 (H) 10/17/2021 1705  ? ? ? ?Risk Assessment/Calculations:   ?  ? ?    ? ?Physical Exam:   ? ?VS:  BP (!) 160/90 (BP Location: Left Arm, Patient Position: Sitting, Cuff Size: Large)   Pulse 98   Ht 5' 11.5" (1.816 m)   Wt (!) 303 lb 6.4 oz (137.6 kg)   SpO2 96%   BMI 41.73 kg/m?    ? ?Wt Readings from Last 3 Encounters:  ?11/08/21 (!) 303 lb 6.4 oz (137.6 kg)  ?10/29/21 (!) 305 lb (138.3 kg)  ?10/17/21 (!) 302 lb 3.2 oz (137.1 kg)  ?  ? ?GEN: Well nourished, well developed in no acute distress ?HEENT: Normal ?NECK: No JVD; No carotid bruits ?CARDIAC:  RRR, 1/6  murmur, No rubs, no gallops ?RESPIRATORY:  Clear to auscultation without rales, wheezing or rhonchi  ?ABDOMEN: Soft, non-tender, non-distended ?MUSCULOSKELETAL:  No edema; No deformity  ?SKIN: Warm and dry ?NE

## 2021-11-08 NOTE — Patient Instructions (Signed)
Medication Instructions:  ? ?START TAKING ROSUVASTATIN (CRESTOR) 10 MG BY MOUTH DAILY ? ?INCREASE YOUR ZESTORETIC TO 20-25 MG DOSE--TAKE ONE TABLET BY MOUTH DAILY ? ?*If you need a refill on your cardiac medications before your next appointment, please call your pharmacy* ? ? ?Lab Work: ? ?1.) IN ONE WEEK--BMET ? ?2.)  IN 3 MONTHS TO CHECK LIPIDS--PLEASE COME FASTING ? ?If you have labs (blood work) drawn today and your tests are completely normal, you will receive your results only by: ?MyChart Message (if you have MyChart) OR ?A paper copy in the mail ?If you have any lab test that is abnormal or we need to change your treatment, we will call you to review the results. ? ? ?Testing/Procedures: ? ?Your physician has requested that you have an echocardiogram. Echocardiography is a painless test that uses sound waves to create images of your heart. It provides your doctor with information about the size and shape of your heart and how well your heart?s chambers and valves are working. This procedure takes approximately one hour. There are no restrictions for this procedure. ? ?Your physician has requested that you have a lexiscan myoview. For further information please visit [email or web address]. Please follow instruction sheet, as given. ? ?Your physician has recommended that you have a sleep study. This test records several body functions during sleep, including: brain activity, eye movement, oxygen and carbon dioxide blood levels, heart rate and rhythm, breathing rate and rhythm, the flow of air through your mouth and nose, snoring, body muscle movements, and chest and belly movement.  YOU WILL GET A CALL FROM OUR SLEEP STUDY COORDINATOR NINA JONES, TO GET THIS TEST SCHEDULED  ? ? ?Follow-Up: ? ?3 MONTHS WITH AN EXTENDER IN THE OFFICE ? ? ? ?

## 2021-11-13 ENCOUNTER — Telehealth: Payer: Self-pay | Admitting: *Deleted

## 2021-11-13 NOTE — Telephone Encounter (Signed)
Pts split night sleep study is scheduled for 12/19/21.  Pt made aware of appt date and time by Gershon Cull, Sleep Study Coordinator. ?

## 2021-11-13 NOTE — Telephone Encounter (Signed)
-----   Message from Nuala Alpha, LPN sent at 9/75/3005  2:57 PM EDT ----- ?Regarding: NEEDS SPLIT NIGHT SLEEP STUDY ?Dr. Johney Frame wants this pt to have a split night sleep study done for OSA.  ?Order is in and pt aware you will call to coordinate this appt.  ?Can you please schedule and let me know the date? ? ?Thanks ?Myla Mauriello  ? ? ? ? ?

## 2021-11-15 ENCOUNTER — Other Ambulatory Visit: Payer: Self-pay

## 2021-11-15 ENCOUNTER — Other Ambulatory Visit: Payer: Medicare Other

## 2021-11-15 DIAGNOSIS — Z8679 Personal history of other diseases of the circulatory system: Secondary | ICD-10-CM

## 2021-11-15 DIAGNOSIS — E785 Hyperlipidemia, unspecified: Secondary | ICD-10-CM

## 2021-11-15 DIAGNOSIS — Z79899 Other long term (current) drug therapy: Secondary | ICD-10-CM

## 2021-11-15 DIAGNOSIS — R0602 Shortness of breath: Secondary | ICD-10-CM

## 2021-11-15 LAB — BASIC METABOLIC PANEL
BUN/Creatinine Ratio: 24 (ref 10–24)
BUN: 23 mg/dL (ref 8–27)
CO2: 24 mmol/L (ref 20–29)
Calcium: 9.7 mg/dL (ref 8.6–10.2)
Chloride: 102 mmol/L (ref 96–106)
Creatinine, Ser: 0.94 mg/dL (ref 0.76–1.27)
Glucose: 117 mg/dL — ABNORMAL HIGH (ref 70–99)
Potassium: 4.6 mmol/L (ref 3.5–5.2)
Sodium: 139 mmol/L (ref 134–144)
eGFR: 89 mL/min/{1.73_m2} (ref >59)

## 2021-11-19 ENCOUNTER — Telehealth: Payer: Self-pay | Admitting: *Deleted

## 2021-11-19 NOTE — Telephone Encounter (Signed)
Patient is scheduled for lab study on 12/19/21. ?Patient understands her sleep study will be done at [facility] sleep lab. ?Patient understands she will receive a sleep packet in a week or so. ?Patient understands to call if she does not receive the sleep packet in a timely manner. ?Patient agrees with treatment and thanked me for call. ?

## 2021-11-29 ENCOUNTER — Telehealth: Payer: Self-pay | Admitting: Cardiology

## 2021-11-29 NOTE — Telephone Encounter (Signed)
Pt states he just signed up with [facility] APP and is inquiring how he would get the strips to Dr. Johney Frame, if needed.  ? ?Advised the pt the easiest way to get them sent to her is via mychart.  Advised him he can attach this via his email in the [facility] message or he can screen shot the tracing with his phone and attach the photo to a mychart message and send to her.  Informed him we do not provide them with an order to have EKGs clinically read through their program. ? ?Pt verbalized understanding and agrees with this plan.  He states he is very familiar with email attachments and how to screen shot a photo and attach to mychart, and he will do just that if needed.  ?Pt appreciative for all the assistance provided.  ?

## 2021-11-29 NOTE — Telephone Encounter (Signed)
Patient calling because he got a Electrical engineer per Dr. Jacolyn Reedy recommendation. He states that [employer] is telling him that in order to have his EKG's clinically read he has to have Dr. Jacolyn Reedy email linked to his [facility] so that she can see them. Not sure if this is something that can be done through MyChart, please advise.  ?

## 2021-12-05 ENCOUNTER — Telehealth (HOSPITAL_COMMUNITY): Payer: Self-pay | Admitting: *Deleted

## 2021-12-05 NOTE — Telephone Encounter (Signed)
Patient given detailed instructions per Myocardial Perfusion Study Information Sheet for the test on 12/13/2021 at 10:15. Patient notified to arrive 15 minutes early and that it is imperative to arrive on time for appointment to keep from having the test rescheduled. ? If you need to cancel or reschedule your appointment, please call the office within 24 hours of your appointment. . Patient verbalized understanding.Bryan Brock ? ? ?

## 2021-12-13 ENCOUNTER — Ambulatory Visit (HOSPITAL_COMMUNITY): Payer: Medicare Other | Attending: Internal Medicine

## 2021-12-13 DIAGNOSIS — Z01810 Encounter for preprocedural cardiovascular examination: Secondary | ICD-10-CM

## 2021-12-13 DIAGNOSIS — Z8679 Personal history of other diseases of the circulatory system: Secondary | ICD-10-CM | POA: Diagnosis present

## 2021-12-13 DIAGNOSIS — R0602 Shortness of breath: Secondary | ICD-10-CM | POA: Diagnosis not present

## 2021-12-13 DIAGNOSIS — Z01818 Encounter for other preprocedural examination: Secondary | ICD-10-CM | POA: Insufficient documentation

## 2021-12-13 MED ORDER — TECHNETIUM TC 99M TETROFOSMIN IV KIT
31.5000 | PACK | Freq: Once | INTRAVENOUS | Status: AC | PRN
  Administered 2021-12-13: 31.5 via INTRAVENOUS
  Filled 2021-12-13: qty 32

## 2021-12-13 MED ORDER — REGADENOSON 0.4 MG/5ML IV SOLN
0.4000 mg | Freq: Once | INTRAVENOUS | Status: AC
  Administered 2021-12-13: 0.4 mg via INTRAVENOUS

## 2021-12-14 ENCOUNTER — Ambulatory Visit (HOSPITAL_BASED_OUTPATIENT_CLINIC_OR_DEPARTMENT_OTHER): Payer: Medicare Other

## 2021-12-14 ENCOUNTER — Ambulatory Visit (HOSPITAL_COMMUNITY): Payer: Medicare Other

## 2021-12-14 DIAGNOSIS — Z01818 Encounter for other preprocedural examination: Secondary | ICD-10-CM

## 2021-12-14 DIAGNOSIS — Z8679 Personal history of other diseases of the circulatory system: Secondary | ICD-10-CM

## 2021-12-14 DIAGNOSIS — R0602 Shortness of breath: Secondary | ICD-10-CM | POA: Diagnosis not present

## 2021-12-14 LAB — MYOCARDIAL PERFUSION IMAGING
LV dias vol: 81 mL (ref 62–150)
LV sys vol: 42 mL
Nuc Stress EF: 48 %
Peak HR: 102 {beats}/min
Rest HR: 90 {beats}/min
Rest Nuclear Isotope Dose: 30.6 mCi
SDS: 7
SRS: 0
SSS: 7
ST Depression (mm): 0 mm
Stress Nuclear Isotope Dose: 31.5 mCi
TID: 0.87

## 2021-12-14 LAB — ECHOCARDIOGRAM COMPLETE
Area-P 1/2: 4.15 cm2
S' Lateral: 3.2 cm

## 2021-12-14 MED ORDER — TECHNETIUM TC 99M TETROFOSMIN IV KIT
30.6000 | PACK | Freq: Once | INTRAVENOUS | Status: AC | PRN
  Administered 2021-12-14: 30.6 via INTRAVENOUS
  Filled 2021-12-14: qty 31

## 2021-12-18 ENCOUNTER — Telehealth: Payer: Self-pay | Admitting: Family Medicine

## 2021-12-18 NOTE — Telephone Encounter (Signed)
If you can call Bryan Brock back today he needs to schedule his surgery but needs to speak to you first. ?

## 2021-12-18 NOTE — Telephone Encounter (Signed)
Lft VM to rtn call. Dm/cma  

## 2021-12-19 ENCOUNTER — Ambulatory Visit (HOSPITAL_BASED_OUTPATIENT_CLINIC_OR_DEPARTMENT_OTHER): Payer: Medicare Other | Attending: Cardiology | Admitting: Cardiology

## 2021-12-19 DIAGNOSIS — G4733 Obstructive sleep apnea (adult) (pediatric): Secondary | ICD-10-CM | POA: Diagnosis present

## 2021-12-19 DIAGNOSIS — R0602 Shortness of breath: Secondary | ICD-10-CM | POA: Insufficient documentation

## 2021-12-19 DIAGNOSIS — Z8679 Personal history of other diseases of the circulatory system: Secondary | ICD-10-CM | POA: Insufficient documentation

## 2021-12-20 NOTE — Procedures (Signed)
? ?  ? ?  Patient Name: Bryan Brock, Bryan Brock ?Study Date: 12/19/2021 ?Gender: Male ?D.O.B: 06/22/1956 ?Age (years): 11 ?Referring Provider: Gwyndolyn Kaufman MD ?Height (inches): 72 ?Interpreting Physician: Fransico Him MD, ABSM ?Weight (lbs): 297 ?RPSGT: Laren Everts ?BMI: 40 ?MRN: [id number] ?Neck Size: 17.25 ? ?CLINICAL INFORMATION ?Sleep Study Type: NPSG ? ?Indication for sleep study: Hypertension, Obesity, Snoring ? ?Epworth Sleepiness Score: 9 ? ?SLEEP STUDY TECHNIQUE ?As per the AASM Manual for the Scoring of Sleep and Associated Events v2.3 (April 2016) with a hypopnea requiring 4% desaturations. ? ?The channels recorded and monitored were frontal, central and occipital EEG, electrooculogram (EOG), submentalis EMG (chin), nasal and oral airflow, thoracic and abdominal wall motion, anterior tibialis EMG, snore microphone, electrocardiogram, and pulse oximetry. ? ?MEDICATIONS ?Medications self-administered by patient taken the night of the study : Nano CBD oil ? ?SLEEP ARCHITECTURE ?The study was initiated at 10:55:24 PM and ended at 4:57:15 AM. ? ?Sleep onset time was 9.4 minutes and the sleep efficiency was 72.7%. The total sleep time was 263 minutes. ? ?Stage REM latency was 165.0 minutes. ? ?The patient spent 6.8% of the night in stage N1 sleep, 74.7% in stage N2 sleep, 0.0% in stage N3 and 18.4% in REM. ? ?Alpha intrusion was absent. ? ?Supine sleep was 4.18%. ? ?RESPIRATORY PARAMETERS ?The overall apnea/hypopnea index (AHI) was 8.2 per hour. There were 4 total apneas, including 4 obstructive, 0 central and 0 mixed apneas. There were 32 hypopneas and 14 RERAs. ? ?The AHI during Stage REM sleep was 12.4 per hour. ? ?AHI while supine was 130.9 per hour. ? ?The mean oxygen saturation was 92.3%. The minimum SpO2 during sleep was 86.0%. ? ?moderate snoring was noted during this study. ? ?CARDIAC DATA ?The 2 lead EKG demonstrated sinus rhythm. The mean heart rate was 73.4 beats per minute. Other EKG findings include:  None. ? ?LEG MOVEMENT DATA ?The total PLMS were 0 with a resulting PLMS index of 0.0. Associated arousal with leg movement index was 0.7 . ? ?IMPRESSIONS ?- Mild obstructive sleep apnea occurred during this study (AHI = 8.2/h). ?- Mild oxygen desaturation was noted during this study (Min O2 = 86.0%). ?- The patient snored with moderate snoring volume. ?- No cardiac abnormalities were noted during this study. ?- Clinically significant periodic limb movements did not occur during sleep. No significant associated arousals. ? ?DIAGNOSIS ?- Obstructive Sleep Apnea (G47.33) ? ?RECOMMENDATIONS ?- Followup in sleep clinic to discuss treatment options.  ?- Positional therapy avoiding supine position during sleep. ?- Avoid alcohol, sedatives and other CNS depressants that may worsen sleep apnea and disrupt normal sleep architecture. ?- Sleep hygiene should be reviewed to assess factors that may improve sleep quality. ?- Weight management and regular exercise should be initiated or continued if appropriate. ? ?[Electronically signed] 12/20/2021 05:18 PM ? ?Fransico Him MD, ABSM ?Diplomate, Tax adviser of Sleep Medicine ?

## 2021-12-20 NOTE — Progress Notes (Deleted)
? ?  Patient Name: Bryan Brock, Bryan Brock ?Study Date: 12/19/2021 ?Gender: Male ?D.O.B: 1956/04/22 ?Age (years): 41 ?Referring Provider: Gwyndolyn Kaufman MD ?Height (inches): 72 ?Interpreting Physician: Fransico Him MD, ABSM ?Weight (lbs): 297 ?RPSGT: Laren Everts ?BMI: 40 ?MRN: [id number] ?Neck Size: 17.25 ? ?CLINICAL INFORMATION ?Sleep Study Type: NPSG ? ?Indication for sleep study: Hypertension, Obesity, Snoring ? ?Epworth Sleepiness Score: 9 ? ?SLEEP STUDY TECHNIQUE ?As per the AASM Manual for the Scoring of Sleep and Associated Events v2.3 (April 2016) with a hypopnea requiring 4% desaturations. ? ?The channels recorded and monitored were frontal, central and occipital EEG, electrooculogram (EOG), submentalis EMG (chin), nasal and oral airflow, thoracic and abdominal wall motion, anterior tibialis EMG, snore microphone, electrocardiogram, and pulse oximetry. ? ?MEDICATIONS ?Medications self-administered by patient taken the night of the study : Nano CBD oil ? ?SLEEP ARCHITECTURE ?The study was initiated at 10:55:24 PM and ended at 4:57:15 AM. ? ?Sleep onset time was 9.4 minutes and the sleep efficiency was 72.7%. The total sleep time was 263 minutes. ? ?Stage REM latency was 165.0 minutes. ? ?The patient spent 6.8% of the night in stage N1 sleep, 74.7% in stage N2 sleep, 0.0% in stage N3 and 18.4% in REM. ? ?Alpha intrusion was absent. ? ?Supine sleep was 4.18%. ? ?RESPIRATORY PARAMETERS ?The overall apnea/hypopnea index (AHI) was 8.2 per hour. There were 4 total apneas, including 4 obstructive, 0 central and 0 mixed apneas. There were 32 hypopneas and 14 RERAs. ? ?The AHI during Stage REM sleep was 12.4 per hour. ? ?AHI while supine was 130.9 per hour. ? ?The mean oxygen saturation was 92.3%. The minimum SpO2 during sleep was 86.0%. ? ?moderate snoring was noted during this study. ? ?CARDIAC DATA ?The 2 lead EKG demonstrated sinus rhythm. The mean heart rate was 73.4 beats per minute. Other EKG findings include:  None. ? ?LEG MOVEMENT DATA ?The total PLMS were 0 with a resulting PLMS index of 0.0. Associated arousal with leg movement index was 0.7 . ? ?IMPRESSIONS ?- Mild obstructive sleep apnea occurred during this study (AHI = 8.2/h). ?- Mild oxygen desaturation was noted during this study (Min O2 = 86.0%). ?- The patient snored with moderate snoring volume. ?- No cardiac abnormalities were noted during this study. ?- Clinically significant periodic limb movements did not occur during sleep. No significant associated arousals. ? ?DIAGNOSIS ?- Obstructive Sleep Apnea (G47.33) ? ?RECOMMENDATIONS ?- Followup in sleep clinic to discuss treatment options.  ?- Positional therapy avoiding supine position during sleep. ?- Avoid alcohol, sedatives and other CNS depressants that may worsen sleep apnea and disrupt normal sleep architecture. ?- Sleep hygiene should be reviewed to assess factors that may improve sleep quality. ?- Weight management and regular exercise should be initiated or continued if appropriate. ? ?[Electronically signed] 12/20/2021 05:18 PM ? ?Fransico Him MD, ABSM ?Diplomate, Tax adviser of Sleep Medicine ?

## 2021-12-20 NOTE — Telephone Encounter (Addendum)
Spoke to patient, he was cleared by cardiologist for surgery on 12/14/21 having an Echocardiogram.  He will need to get claerance form you to the the surgery.   ? ?Please review and advise.  ?Thanks. Dm/cma ?  ? ? ?

## 2021-12-21 NOTE — Telephone Encounter (Signed)
Called and spoke to patient. Patient stated he reached out to surgeon office and was told they did not need anything from Dr. Gena Fray. No other concerns mentioned. Sw, cma ?

## 2021-12-24 ENCOUNTER — Telehealth: Payer: Self-pay | Admitting: Family Medicine

## 2021-12-24 ENCOUNTER — Telehealth: Payer: Self-pay | Admitting: *Deleted

## 2021-12-24 NOTE — Telephone Encounter (Signed)
Pt is requesting a phone call from Dr Gena Fray today sometime. He wants to move forward and needs his sign off. ?

## 2021-12-24 NOTE — Telephone Encounter (Signed)
Please advise 

## 2021-12-24 NOTE — Telephone Encounter (Signed)
-----   Message from Lauralee Evener, [place] sent at 12/21/2021 12:43 PM EDT ----- ? ?----- Message ----- ?From: Sueanne Margarita, MD ?Sent: 12/20/2021   5:19 PM EDT ?To: C[facility] Sleep Studies ? ?Patient has very mild OSA - set up OV to discuss treatment options.    ? ?

## 2021-12-24 NOTE — Telephone Encounter (Signed)
LVM with providers with instructions. ?

## 2021-12-24 NOTE — Telephone Encounter (Signed)
The patient has been notified of the result and verbalized understanding.  All questions (if any) were answered. ?Bryan Brock, [place] 12/24/2021 4:14 PM   ? ?Pt is aware and agreeable to normal results  ?Patient wants a mychart visit. ?[name] will call patient to get himscheduled. ? ? ?

## 2021-12-25 NOTE — Telephone Encounter (Signed)
Patient advised VIA phone. Dm/cma ? ?

## 2021-12-31 ENCOUNTER — Telehealth: Payer: Self-pay | Admitting: Cardiology

## 2021-12-31 NOTE — Telephone Encounter (Signed)
Per Dr. Johney Frame "His stress test shows no evidence of decreased blood flow to the heart muscle which is great news. The pumping function on the echo was normal. No further testing needing prior to his procedure". ? ?Given past medical history and time since last visit, based on ACC/AHA guidelines, SAMI FROH would be at acceptable risk for the planned procedure without further cardiovascular testing.  ? ? ?I will route this recommendation to the requesting party via Epic fax function and remove from pre-op pool. ? ?Please call with questions. ? ?Leanor Kail, PA ?12/31/2021, 10:48 AM  ?

## 2021-12-31 NOTE — Telephone Encounter (Signed)
? ?  Pre-operative Risk Assessment  ?  ?Patient Name: Bryan Brock  ?DOB: [birth year] ?MRN: [id number]  ? ?  ? ?Request for Surgical Clearance   ? ?Procedure:  Bilateral Hydrocelectomy  ? ?Date of Surgery:  Clearance TBD                              ?   ?Surgeon:  Dr. Claudia Desanctis  ?Surgeon's Group or Practice Name:  [facility] ?Phone number:  [phone] ?Fax number:  [phone] ?  ?Type of Clearance Requested:   ?- Medical  ?  ?Type of Anesthesia:  Spinal  ?  ?Additional requests/questions:   ? ?Signed, ?Johnna Acosta   ?12/31/2021, 10:19 AM   ?

## 2022-01-03 ENCOUNTER — Other Ambulatory Visit: Payer: Self-pay | Admitting: Urology

## 2022-01-28 ENCOUNTER — Ambulatory Visit (INDEPENDENT_AMBULATORY_CARE_PROVIDER_SITE_OTHER): Payer: Medicare Other | Admitting: Family Medicine

## 2022-01-28 ENCOUNTER — Encounter: Payer: Self-pay | Admitting: Family Medicine

## 2022-01-28 VITALS — BP 144/88 | HR 87 | Temp 98.6°F | Wt 316.4 lb

## 2022-01-28 DIAGNOSIS — I10 Essential (primary) hypertension: Secondary | ICD-10-CM

## 2022-01-28 DIAGNOSIS — Z6841 Body Mass Index (BMI) 40.0 and over, adult: Secondary | ICD-10-CM

## 2022-01-28 DIAGNOSIS — G4733 Obstructive sleep apnea (adult) (pediatric): Secondary | ICD-10-CM | POA: Diagnosis not present

## 2022-01-28 DIAGNOSIS — N433 Hydrocele, unspecified: Secondary | ICD-10-CM | POA: Diagnosis not present

## 2022-01-28 MED ORDER — LISINOPRIL-HYDROCHLOROTHIAZIDE 20-25 MG PO TABS: 1.0000 | ORAL_TABLET | Freq: Two times a day (BID) | ORAL | 3 refills | Status: DC

## 2022-01-28 NOTE — Progress Notes (Signed)
[facility] [address] [place] [address] Dept: [phone] Dept Fax: [phone]  Chronic Care Office Visit  Subjective:    Patient ID: Bryan Brock, male    DOB: [birth year], 66 y.o..   MRN: [id number]  Chief Complaint  Patient presents with   Follow-up    3 month follow up on HTN. Patient is nonfasting. Patient is aware he is due for colonoscopy and shingles will schedule when he has time.    History of Present Illness:  Patient is in today for reassessment of chronic medical issues.  [name] has bilateral hydroceles which he is electing to have surgery for. The surgery has been delayed. It is now scheduled for July 28th.   [name] has a history of prior PVCs and mitral valve prolapse. I had referred him to cardiology. His echo and myocardial perfusion scan are normal. He notes that he does plan to start to exercising more regularly. He knows he needs to reduce his weight to improve his health. He does asks about Ozempic.  [name] has a history of hyperlipidemia. He is managed on rosuvastatin 10 mg daily. He notes he is scheduled to come back for fasting lipids on June 15th.   [name] has a history hypertension. He is managed on lisinopril-HCTZ 20-25 mg daily.   [name] has a history of mild OSA. He is working to get a CPAP unit that will work well for him. He has a follow-up with Dr. Radford Pax on Aug. 24th.  Past Medical History: Patient Active Problem List   Diagnosis Date Noted   OSA (obstructive sleep apnea) 12/19/2021   Essential hypertension 10/29/2021   Elevated transaminase level 10/18/2021   Prediabetes 10/17/2021   Morbid obesity with BMI of 40.0-44.9, adult ([place]) 10/17/2021   Hydrocele, bilateral 08/28/2021   Bipolar 1 disorder, depressed ([place]) 05/11/2020   History of basal cell carcinoma 11/05/2007   Borderline hyperlipidemia 11/05/2007   Premature ventricular contractions 11/05/2007    History of mitral valve prolapse 11/05/2007   Past Surgical History:  Procedure Laterality Date   MOHS SURGERY  1990   STRABISMUS SURGERY Left    TONSILLECTOMY AND ADENOIDECTOMY     Family History  Problem Relation Age of Onset   Heart disease Mother    Diabetes Mother    Obesity Mother    Hyperlipidemia Mother    Cancer Father        skin cancer   Heart disease Father    Hyperlipidemia Father    Outpatient Medications Prior to Visit  Medication Sig Dispense Refill   Ascorbic Acid (VITAMIN C) 1000 MG tablet Take 2,000 mg by mouth daily.     cholecalciferol (VITAMIN D3) 25 MCG (1000 UNIT) tablet Take 1,000 Units by mouth daily.     co-enzyme Q-10 30 MG capsule Take 30 mg by mouth 3 (three) times daily.     Flaxseed, Linseed, (FLAX SEED OIL PO) Take by mouth.     folic acid (FOLVITE) 545 MCG tablet Take 400 mcg by mouth daily.     lamoTRIgine (LAMICTAL) 100 MG tablet Take 100 mg by mouth daily.     magnesium oxide (MAG-OX) 400 MG tablet Take 400 mg by mouth daily.     Multiple Vitamins-Minerals (MULTIVITAMIN WITH MINERALS) tablet Take 1 tablet by mouth daily.     naproxen sodium (ALEVE) 220 MG tablet Take 220 mg by mouth daily.     Omega 3 1000 MG CAPS Take  by mouth.     OVER THE COUNTER MEDICATION Boostaro supplement     OVER THE COUNTER MEDICATION Nano CBD OIL: Pt takes 20 drops in the morning and 20 drops in the evening.     rosuvastatin (CRESTOR) 10 MG tablet Take 1 tablet (10 mg total) by mouth daily. 90 tablet 2   sildenafil (VIAGRA) 50 MG tablet Take 50 mg by mouth daily as needed for erectile dysfunction.     zinc gluconate 50 MG tablet Take 50 mg by mouth daily.     lisinopril-hydrochlorothiazide (ZESTORETIC) 20-25 MG tablet Take 1 tablet by mouth daily. 90 tablet 1   OVER THE COUNTER MEDICATION APPLE CIDER VINEGAR supplement: 1 tablespoon on Mon. Wed. And Fridays. (Patient not taking: Reported on 01/28/2022)     No facility-administered medications prior to visit.   No  Known Allergies    Objective:   Today's Vitals   01/28/22 0933  BP: (!) 144/88  Pulse: 87  Temp: 98.6 F (37 C)  TempSrc: Temporal  SpO2: 97%  Weight: (!) 316 lb 6.4 oz (143.5 kg)   Body mass index is 42.91 kg/m.   General: Well developed, well nourished. No acute distress. Psych: Alert and oriented. Normal mood and affect.  Health Maintenance Due  Topic Date Due   Hepatitis C Screening  Never done   Zoster Vaccines- Shingrix (1 of 2) Never done   COLONOSCOPY (Pts 45-73yr Insurance coverage will need to be confirmed)  Never done     Assessment & Plan:   1. Essential hypertension Mr. SZeitlinblood pressure is improved, but remains above goal. I will increase his Zestoretic to twice a day. I will plan to reassess him in 3 months.  - lisinopril-hydrochlorothiazide (ZESTORETIC) 20-25 MG tablet; Take 1 tablet by mouth in the morning and at bedtime.  Dispense: 180 tablet; Refill: 3  2. OSA (obstructive sleep apnea) Workign with sleep medicine regarding CPAP appliance.  3. Hydrocele, bilateral Scheduled for surgery in late July.  4. Morbid obesity with BMI of 40.0-44.9, [place] 13 lbs in 3 months. We discussed approaches to weight loss. I strongly emphasized improved diet and regular exercise. We did discuss the role medications can play in weight loss, but the cautions regarding regaining weight after medication is stopped. He should first concentrate on improving his diet and well establishing a routine exercise program.  Return in about 3 months (around 04/30/2022) for Reassessment.   SHaydee Salter MD

## 2022-02-04 ENCOUNTER — Other Ambulatory Visit (HOSPITAL_COMMUNITY): Payer: Medicare Other

## 2022-02-07 ENCOUNTER — Other Ambulatory Visit: Payer: Medicare Other

## 2022-02-07 DIAGNOSIS — Z6841 Body Mass Index (BMI) 40.0 and over, adult: Secondary | ICD-10-CM

## 2022-02-07 DIAGNOSIS — R0602 Shortness of breath: Secondary | ICD-10-CM

## 2022-02-07 DIAGNOSIS — Z8679 Personal history of other diseases of the circulatory system: Secondary | ICD-10-CM

## 2022-02-07 DIAGNOSIS — Z79899 Other long term (current) drug therapy: Secondary | ICD-10-CM

## 2022-02-07 DIAGNOSIS — E785 Hyperlipidemia, unspecified: Secondary | ICD-10-CM

## 2022-02-07 LAB — LIPID PANEL
Chol/HDL Ratio: 3.2 ratio (ref 0.0–5.0)
Cholesterol, Total: 143 mg/dL (ref 100–199)
HDL: 45 mg/dL (ref >39)
LDL Chol Calc (NIH): 76 mg/dL (ref 0–99)
Triglycerides: 123 mg/dL (ref 0–149)
VLDL Cholesterol Cal: 22 mg/dL (ref 5–40)

## 2022-02-13 NOTE — Progress Notes (Signed)
Cardiology Office Note:    Date:  02/14/2022   ID:  Bryan Brock, DOB 11/10/1955, MRN [id number]  PCP:  Haydee Salter, MD   [facility] Providers Cardiologist:  None     Referring MD: Haydee Salter, MD   Chief Complaint: follow-up hypertension  History of Present Illness:    Bryan Brock is a very pleasant 66 y.o. male with a hx of MVP, symptomatic PVCs, hypertension, hyperlipidemia, and morbid obesity.    He was referred to cardiology for evaluation of MVP.  Diagnosed at age 89 to 66 years old.  Previously seen by Dr. Megan Salon.  Around that time he had sudden syncope episode.  Described prodrome of warm rashes, lightheadedness, and arrhythmias.  TTE at that time with mild MVP.  Holter per report with no significant arrhythmias.  Was then followed by Dr. Claiborne Billings but has not been seen in several years.  Seen by Dr. Johney Frame on 11/08/21. Reportedly was seen by PCP on 10/17/2021 for preoperative clearance for bilateral hydrocele repair.  BP was 180/100.  EKGs showed possible LAE and RVH.  Due to significant hypertension, pedal edema, and abnormal EKG he was referred to cardiology to assess for heart failure prior to surgery.  Seen in follow-up by PCP on 10/29/2021 BP was 162/94.  He was started on 10-12.5 mg lisinopril-HCTZ daily.  He reported 5 episodes of palpitations or skipped beats.  Describes his palpitations as pronounced and start suddenly with known triggers.  He is able to feel in his cerebral circulation in his chest.  Episodes may last 30 minutes to several hours and may wake him at night.  Most recent episode 2 to 3 weeks prior to office visit. Usually occur every 2-4 months.  Voiced concerns about his weight of 303 pounds in clinic. He wanted to pursue weight reduction program and exercise, however having shortness of breath and elevated heart rates with minimal exertion and limited by issues with right knee.  Cardiac testing was ordered in order to clear patient for surgery.   Sleep test revealed very mild OSA.  Referred to Dr. Radford Pax for sleep management.  Nuclear stress test showed no evidence of ischemia or infarction. Echo revealed normal LVEF, G1DD, mild LVH, no rwma, elongated mitral valve leaflets with no obvious prolapse.  He was subsequently cleared for surgery on 12/31/21.  Today, he is here for 3 month follow-up of hypertension and to complete surgical clearance for hydrocelectomy scheduled for late July.  He reports home BP readings systolic average 675F, diastolic 16B-84Y.  He recently dropped off 5 days worth of BP readings to PCP. Reports no further palpitations. These were previously very pronounced and would keep him from sleeping but he is sleeping very well now. Has appointment in August with Dr. Radford Pax for management of his mild sleep apnea.  He continues to work part-time.  Activity is limited by right knee pain - bone on bone, will eventually need replacement.  Continues to have dyspnea with exertion, no chest pain. He denies orthopnea, PND, lightheadedness, presyncope, syncope. Feels that DOE is secondary to his weight.  Admits he loves to eat, food is a comfort to him. Girlfriend only cooks healthy meals, no sweats but he tends to eat things he shouldn't at breakfast and lunch.  Past Medical History:  Diagnosis Date   Acne    Bipolar 1 disorder ([place])    Depression    Panic attacks     Past Surgical History:  Procedure Laterality Date  MOHS SURGERY  1990   STRABISMUS SURGERY Left    TONSILLECTOMY AND ADENOIDECTOMY      Current Medications: Current Meds  Medication Sig   Ascorbic Acid (VITAMIN C) 1000 MG tablet Take 3,000 mg by mouth daily.   cholecalciferol (VITAMIN D3) 25 MCG (1000 UNIT) tablet Take 500 mcg by mouth daily.   co-enzyme Q-10 30 MG capsule Take 100 mg by mouth daily.   Flaxseed, Linseed, (FLAX SEED OIL PO) Take by mouth.   folic acid (FOLVITE) 540 MCG tablet Take 800 mcg by mouth daily.   lamoTRIgine (LAMICTAL) 100 MG tablet  Take 100 mg by mouth daily.   lisinopril-hydrochlorothiazide (ZESTORETIC) 20-25 MG tablet Take 2 tablets by mouth daily.   magnesium oxide (MAG-OX) 400 MG tablet Take 400 mg by mouth daily.   Multiple Vitamins-Minerals (MULTIVITAMIN WITH MINERALS) tablet Take 1 tablet by mouth daily.   naproxen sodium (ALEVE) 220 MG tablet Take 220 mg by mouth daily.   Omega 3 1000 MG CAPS Take by mouth.   OVER THE COUNTER MEDICATION Nano CBD OIL: Pt takes 20 drops in the morning and 20 drops in the evening.   rosuvastatin (CRESTOR) 10 MG tablet Take 1 tablet (10 mg total) by mouth daily.   sildenafil (VIAGRA) 50 MG tablet Take 50 mg by mouth daily as needed for erectile dysfunction.   zinc gluconate 50 MG tablet Take 50 mg by mouth daily.   [DISCONTINUED] lisinopril-hydrochlorothiazide (ZESTORETIC) 20-25 MG tablet Take 1 tablet by mouth in the morning and at bedtime. (Patient taking differently: Take 2 tablets by mouth in the morning and at bedtime.)     Allergies:   Patient has no known allergies.   Social History   Socioeconomic History   Marital status: Soil scientist    Spouse name: Not on file   Number of children: 2   Years of education: Not on file   Highest education level: Not on file  Occupational History   Occupation: Administrator, Civil Service    Comment: Part-Time  Tobacco Use   Smoking status: Never   Smokeless tobacco: Current    Types: Chew  Vaping Use   Vaping Use: Never used  Substance and Sexual Activity   Alcohol use: Yes    Comment: occasionanly   Drug use: Never   Sexual activity: Yes  Other Topics Concern   Not on file  Social History Narrative   Not on file   Social Determinants of Health   Financial Resource Strain: Low Risk  (02/20/2021)   Overall Financial Resource Strain (CARDIA)    Difficulty of Paying Living Expenses: Not hard at all  Food Insecurity: No Food Insecurity (02/20/2021)   Hunger Vital Sign    Worried About Running Out of Food in the Last Year: Never  true    [place] in the Last Year: Never true  Transportation Needs: No Transportation Needs (02/20/2021)   PRAPARE - Hydrologist (Medical): No    Lack of Transportation (Non-Medical): No  Physical Activity: Sufficiently Active (02/20/2021)   Exercise Vital Sign    Days of Exercise per Week: 3 days    Minutes of Exercise per Session: 60 min  Stress: No Stress Concern Present (02/20/2021)   [place]    Feeling of Stress : Not at all  Social Connections: [place] (02/20/2021)   Social Connection and Isolation Panel [NHANES]    Frequency of Communication with Friends  and Family: Three times a week    Frequency of Social Gatherings with Friends and Family: Three times a week    Attends Religious Services: More than 4 times per year    Active Member of Clubs or Organizations: Yes    Attends Archivist Meetings: Never    Marital Status: Married     Family History: The patient's family history includes Cancer in his father; Diabetes in his mother; Heart disease in his father and mother; Hyperlipidemia in his father and mother; Obesity in his mother.  ROS:   Please see the history of present illness.    + DOE All other systems reviewed and are negative.  Labs/Other Studies Reviewed:    The following studies were reviewed today:  Leane Call 12/14/21    Findings are consistent with no prior ischemia and no prior myocardial infarction. The study is intermediate risk.   No ST deviation was noted.   Left ventricular function is abnormal. Nuclear stress EF: 48 %. The left ventricular ejection fraction is mildly decreased (45-54%). End diastolic cavity size is normal. End systolic cavity size is normal.   Prior study not available for comparison.   Findings: Negative for stress induced arrhythmias. No evidence of ischemia or infarction. LV function is mildly  reduced.   Conclusions: Stress test is negative. Intermediate risk due to decrease LV function, thought function look better on concomitant echo  Echo 12/14/21  1. Left ventricular ejection fraction, by estimation, is 65 to 70%. The  left ventricle has normal function. The left ventricle has no regional  wall motion abnormalities. There is mild left ventricular hypertrophy.  Left ventricular diastolic parameters  are consistent with Grade I diastolic dysfunction (impaired relaxation).  The average left ventricular global longitudinal strain is -19.8 %. The  global longitudinal strain is normal.   2. Right ventricular systolic function is normal. The right ventricular  size is normal.   3. The mitral valve leaflets are elongated - no obvious prolapse. The  mitral valve is abnormal. Trivial mitral valve regurgitation.   4. The aortic valve is tricuspid. Aortic valve regurgitation is not  visualized.   5. The inferior vena cava is normal in size with greater than 50%  respiratory variability, suggesting right atrial pressure of 3 mmHg.   6. Cannot exclude a small PFO.   Recent Labs: 10/17/2021: ALT 69; Hemoglobin 14.7; Platelets 165.0; Pro B Natriuretic peptide (BNP) 169.0; TSH 1.40 11/15/2021: BUN 23; Creatinine, Ser 0.94; Potassium 4.6; Sodium 139  Recent Lipid Panel    Component Value Date/Time   CHOL 143 02/07/2022 0932   TRIG 123 02/07/2022 0932   HDL 45 02/07/2022 0932   CHOLHDL 3.2 02/07/2022 0932   CHOLHDL 4 10/17/2021 1705   VLDL 22.2 10/17/2021 1705   LDLCALC 76 02/07/2022 0932     Risk Assessment/Calculations:      Physical Exam:    VS:  BP 140/84 (BP Location: Left Arm, Patient Position: Sitting, Cuff Size: Large)   Pulse 87   Ht 6' (1.829 m)   Wt (!) 314 lb 9.6 oz (142.7 kg)   SpO2 96%   BMI 42.67 kg/m     Wt Readings from Last 3 Encounters:  02/14/22 (!) 314 lb 9.6 oz (142.7 kg)  01/28/22 (!) 316 lb 6.4 oz (143.5 kg)  12/19/21 297 lb (134.7 kg)      GEN: Well developed, obese gentleman in no acute distress HEENT: Normal NECK: No JVD; No carotid bruits CARDIAC: RRR, no  murmurs, rubs, gallops RESPIRATORY:  Clear to auscultation without rales, wheezing or rhonchi  ABDOMEN: Soft, non-tender, non-distended MUSCULOSKELETAL:  No edema; No deformity. 2+ pedal pulses, equal bilaterally SKIN: Warm and dry NEUROLOGIC:  Alert and oriented x 3 PSYCHIATRIC:  Normal affect   EKG:  EKG is not ordered today.   Diagnoses:    1. Pre-operative clearance   2. Essential hypertension   3. History of mitral valve prolapse   4. OSA (obstructive sleep apnea)   5. Palpitations   6. DOE (dyspnea on exertion)    Assessment and Plan:     Preoperative cardiac evaluation: Nuclear stress test 12/14/2021 shows no evidence of ischemia and echo shows normal heart function. he is doing well from a cardiac perspective and may proceed to surgery without further testing. According to the Revised Cardiac Risk Index (RCRI), his Perioperative Risk of Major Cardiac Event is (%): 0.4. His Functional Capacity in METs is: 6.05 according to the Duke Activity Status Index (DASI).  Palpitations: Quiescent at this time.  Would favor cardiac monitor if palpitations return.  Dyspnea on exertion: LVEF 65-70%, G1 DD, no obvious MVP on echo 11/2021. Discussed the encouraging findings of his recent stress test and echo in detail, however cautioned him to continue to monitor symptoms. If DOE worsens, or if he develops chest pain or other concerning symptoms to report to [place]. Encouraged him to continue to work on weight loss by setting small goals and noting his achievement.   Hypertension: BP slightly elevated today. BP 140/84 by my recheck. Advised patient to consider addition of amlodipine 2.5 mg if BP remains consistently > 130/80.  He will discuss with PCP.  Mitral valve prolapse: Mitral valve leaflets elongated, no obvious prolapse with trivial MR on echo 12/14/2021. He is  asymptomatic. Continue to monitor with repeat echo in 3-5 years, sooner if clinically indicated.   OSA: Mild by sleep test on 12/18/21.  He has an appointment with Dr. Radford Pax for sleep management in August.     Disposition: 6 months with Dr. Johney Frame  Medication Adjustments/Labs and Tests Ordered: Current medicines are reviewed at length with the patient today.  Concerns regarding medicines are outlined above.  No orders of the defined types were placed in this encounter.  No orders of the defined types were placed in this encounter.   Patient Instructions  Medication Instructions:   Please review with PCP.  Recommended adding Amlodipine one (1) tablet by mouth ( 2.5 mg) daily if your BP stays above 130/80.   *If you need a refill on your cardiac medications before your next appointment, please call your pharmacy*   Lab Work:  None ordered.  If you have labs (blood work) drawn today and your tests are completely normal, you will receive your results only by: [place] (if you have MyChart) OR A paper copy in the mail If you have any lab test that is abnormal or we need to change your treatment, we will call you to review the results.   Testing/Procedures:  None ordered.   Follow-Up: At [facility], you and your health needs are our priority.  As part of our continuing mission to provide you with exceptional heart care, we have created designated Provider Care Teams.  These Care Teams include your primary Cardiologist (physician) and Advanced Practice Providers (APPs -  Physician Assistants and Nurse Practitioners) who all work together to provide you with the care you need, when you need it.  We recommend signing up for  the patient portal called "MyChart".  Sign up information is provided on this After Visit Summary.  MyChart is used to connect with patients for Virtual Visits (Telemedicine).  Patients are able to view lab/test results, encounter notes, upcoming  appointments, etc.  Non-urgent messages can be sent to your provider as well.   To learn more about what you can do with MyChart, go to [email or web address].    Your next appointment:   6 month(s)  The format for your next appointment:   In Person  Provider:   Dr. Gwyndolyn Kaufman  If primary card or EP is not listed click here to update    :1}    Important Information About Sugar         Signed, Emmaline Life, NP  02/14/2022 1:15 PM    [place]

## 2022-02-14 ENCOUNTER — Encounter: Payer: Self-pay | Admitting: Nurse Practitioner

## 2022-02-14 ENCOUNTER — Ambulatory Visit (INDEPENDENT_AMBULATORY_CARE_PROVIDER_SITE_OTHER): Payer: Medicare Other | Admitting: Nurse Practitioner

## 2022-02-14 VITALS — BP 140/84 | HR 87 | Ht 72.0 in | Wt 314.6 lb

## 2022-02-14 DIAGNOSIS — Z01818 Encounter for other preprocedural examination: Secondary | ICD-10-CM

## 2022-02-14 DIAGNOSIS — Z8679 Personal history of other diseases of the circulatory system: Secondary | ICD-10-CM

## 2022-02-14 DIAGNOSIS — R0609 Other forms of dyspnea: Secondary | ICD-10-CM

## 2022-02-14 DIAGNOSIS — G4733 Obstructive sleep apnea (adult) (pediatric): Secondary | ICD-10-CM

## 2022-02-14 DIAGNOSIS — R002 Palpitations: Secondary | ICD-10-CM

## 2022-02-14 DIAGNOSIS — I10 Essential (primary) hypertension: Secondary | ICD-10-CM

## 2022-03-05 ENCOUNTER — Ambulatory Visit (INDEPENDENT_AMBULATORY_CARE_PROVIDER_SITE_OTHER): Payer: Medicare Other

## 2022-03-05 DIAGNOSIS — Z0000 Encounter for general adult medical examination without abnormal findings: Secondary | ICD-10-CM | POA: Diagnosis not present

## 2022-03-05 NOTE — Patient Instructions (Signed)
Bryan Brock , Thank you for taking time to come for your Medicare Wellness Visit. I appreciate your ongoing commitment to your health goals. Please review the following plan we discussed and let me know if I can assist you in the future.   Screening recommendations/referrals: Colonoscopy: will schedule after surgery per patient  Recommended yearly ophthalmology/optometry visit for glaucoma screening and checkup Recommended yearly dental visit for hygiene and checkup  Vaccinations: Influenza vaccine: declined  Pneumococcal vaccine: completed  Tdap vaccine: due  Shingles vaccine: will consider     Advanced directives: none   Conditions/risks identified: none   Next appointment: none   Preventive Care 67 Years and Older, Male Preventive care refers to lifestyle choices and visits with your health care provider that can promote health and wellness. What does preventive care include? A yearly physical exam. This is also called an annual well check. Dental exams once or twice a year. Routine eye exams. Ask your health care provider how often you should have your eyes checked. Personal lifestyle choices, including: Daily care of your teeth and gums. Regular physical activity. Eating a healthy diet. Avoiding tobacco and drug use. Limiting alcohol use. Practicing safe sex. Taking low doses of aspirin every day. Taking vitamin and mineral supplements as recommended by your health care provider. What happens during an annual well check? The services and screenings done by your health care provider during your annual well check will depend on your age, overall health, lifestyle risk factors, and family history of disease. Counseling  Your health care provider may ask you questions about your: Alcohol use. Tobacco use. Drug use. Emotional well-being. Home and relationship well-being. Sexual activity. Eating habits. History of falls. Memory and ability to understand (cognition). Work  and work Statistician. Screening  You may have the following tests or measurements: Height, weight, and BMI. Blood pressure. Lipid and cholesterol levels. These may be checked every 5 years, or more frequently if you are over 67 years old. Skin check. Lung cancer screening. You may have this screening every year starting at age 67 if you have a 30-pack-year history of smoking and currently smoke or have quit within the past 15 years. Fecal occult blood test (FOBT) of the stool. You may have this test every year starting at age 67. Flexible sigmoidoscopy or colonoscopy. You may have a sigmoidoscopy every 5 years or a colonoscopy every 10 years starting at age 67. Prostate cancer screening. Recommendations will vary depending on your family history and other risks. Hepatitis C blood test. Hepatitis B blood test. Sexually transmitted disease (STD) testing. Diabetes screening. This is done by checking your blood sugar (glucose) after you have not eaten for a while (fasting). You may have this done every 1-3 years. Abdominal aortic aneurysm (AAA) screening. You may need this if you are a current or former smoker. Osteoporosis. You may be screened starting at age 67 if you are at high risk. Talk with your health care provider about your test results, treatment options, and if necessary, the need for more tests. Vaccines  Your health care provider may recommend certain vaccines, such as: Influenza vaccine. This is recommended every year. Tetanus, diphtheria, and acellular pertussis (Tdap, Td) vaccine. You may need a Td booster every 10 years. Zoster vaccine. You may need this after age 67. Pneumococcal 13-valent conjugate (PCV13) vaccine. One dose is recommended after age 67. Pneumococcal polysaccharide (PPSV23) vaccine. One dose is recommended after age 67. Talk to your health care provider about which screenings and  vaccines you need and how often you need them. This information is not intended  to replace advice given to you by your health care provider. Make sure you discuss any questions you have with your health care provider. Document Released: 09/08/2015 Document Revised: 05/01/2016 Document Reviewed: 06/13/2015 Elsevier Interactive Patient Education  2017 [place] Prevention in the Home Falls can cause injuries. They can happen to people of all ages. There are many things you can do to make your home safe and to help prevent falls. What can I do on the outside of my home? Regularly fix the edges of walkways and driveways and fix any cracks. Remove anything that might make you trip as you walk through a door, such as a raised step or threshold. Trim any bushes or trees on the path to your home. Use bright outdoor lighting. Clear any walking paths of anything that might make someone trip, such as rocks or tools. Regularly check to see if handrails are loose or broken. Make sure that both sides of any steps have handrails. Any raised decks and porches should have guardrails on the edges. Have any leaves, snow, or ice cleared regularly. Use sand or salt on walking paths during winter. Clean up any spills in your garage right away. This includes oil or grease spills. What can I do in the bathroom? Use night lights. Install grab bars by the toilet and in the tub and shower. Do not use towel bars as grab bars. Use non-skid mats or decals in the tub or shower. If you need to sit down in the shower, use a plastic, non-slip stool. Keep the floor dry. Clean up any water that spills on the floor as soon as it happens. Remove soap buildup in the tub or shower regularly. Attach bath mats securely with double-sided non-slip rug tape. Do not have throw rugs and other things on the floor that can make you trip. What can I do in the bedroom? Use night lights. Make sure that you have a light by your bed that is easy to reach. Do not use any sheets or blankets that are too big  for your bed. They should not hang down onto the floor. Have a firm chair that has side arms. You can use this for support while you get dressed. Do not have throw rugs and other things on the floor that can make you trip. What can I do in the kitchen? Clean up any spills right away. Avoid walking on wet floors. Keep items that you use a lot in easy-to-reach places. If you need to reach something above you, use a strong step stool that has a grab bar. Keep electrical cords out of the way. Do not use floor polish or wax that makes floors slippery. If you must use wax, use non-skid floor wax. Do not have throw rugs and other things on the floor that can make you trip. What can I do with my stairs? Do not leave any items on the stairs. Make sure that there are handrails on both sides of the stairs and use them. Fix handrails that are broken or loose. Make sure that handrails are as long as the stairways. Check any carpeting to make sure that it is firmly attached to the stairs. Fix any carpet that is loose or worn. Avoid having throw rugs at the top or bottom of the stairs. If you do have throw rugs, attach them to the floor with carpet tape. Make  sure that you have a light switch at the top of the stairs and the bottom of the stairs. If you do not have them, ask someone to add them for you. What else can I do to help prevent falls? Wear shoes that: Do not have high heels. Have rubber bottoms. Are comfortable and fit you well. Are closed at the toe. Do not wear sandals. If you use a stepladder: Make sure that it is fully opened. Do not climb a closed stepladder. Make sure that both sides of the stepladder are locked into place. Ask someone to hold it for you, if possible. Clearly mark and make sure that you can see: Any grab bars or handrails. First and last steps. Where the edge of each step is. Use tools that help you move around (mobility aids) if they are needed. These  include: Canes. Walkers. Scooters. Crutches. Turn on the lights when you go into a dark area. Replace any light bulbs as soon as they burn out. Set up your furniture so you have a clear path. Avoid moving your furniture around. If any of your floors are uneven, fix them. If there are any pets around you, be aware of where they are. Review your medicines with your doctor. Some medicines can make you feel dizzy. This can increase your chance of falling. Ask your doctor what other things that you can do to help prevent falls. This information is not intended to replace advice given to you by your health care provider. Make sure you discuss any questions you have with your health care provider. Document Released: 06/08/2009 Document Revised: 01/18/2016 Document Reviewed: 09/16/2014 Elsevier Interactive Patient Education  2017 [employer].

## 2022-03-05 NOTE — Progress Notes (Signed)
Subjective:   GUNNER IODICE is a 66 y.o. male who presents for an Subsequent Medicare Annual Wellness Visit.   I connected with Nadene Rubins  today by telephone and verified that I am speaking with the correct person using two identifiers. Location patient: home Location provider: work Persons participating in the virtual visit: patient, provider.   I discussed the limitations, risks, security and privacy concerns of performing an evaluation and management service by telephone and the availability of in person appointments. I also discussed with the patient that there may be a patient responsible charge related to this service. The patient expressed understanding and verbally consented to this telephonic visit.    Interactive audio and video telecommunications were attempted between this provider and patient, however failed, due to patient having technical difficulties OR patient did not have access to video capability.  We continued and completed visit with audio only.    Review of Systems     Cardiac Risk Factors include: advanced age (>70mn, >>34women);male gender     Objective:    Today's Vitals   There is no height or weight on file to calculate BMI.     03/05/2022   11:39 AM 12/19/2021    8:57 PM 02/20/2021    2:28 PM 09/13/2013   11:52 PM  Advanced Directives  Does Patient Have a Medical Advance Directive? No No No Patient does not have advance directive;Patient would not like information  Would patient like information on creating a medical advance directive? No - Patient declined No - Patient declined No - Patient declined     Current Medications (verified) Outpatient Encounter Medications as of 03/05/2022  Medication Sig   APPLE CIDER VINEGAR PO Take 15 mLs by mouth every Monday, Wednesday, and Friday.   Ascorbic Acid (VITAMIN C) 1000 MG tablet Take 3,000 mg by mouth daily.   calcium elemental as carbonate (TUMS ULTRA 1000) 400 MG chewable tablet Chew 2,000 mg by  mouth 3 (three) times daily as needed for heartburn.   Cholecalciferol (DIALYVITE VITAMIN D 5000) 125 MCG (5000 UT) capsule Take 20,000 Units by mouth daily.   Coenzyme Q10 (COQ-10) 100 MG CAPS Take 100 mg by mouth daily.   Flaxseed, Linseed, (FLAX SEED OIL) 1000 MG CAPS Take 1,000 mg by mouth daily.   folic acid (FOLVITE) 8563MCG tablet Take 800 mcg by mouth daily.   lamoTRIgine (LAMICTAL) 100 MG tablet Take 100 mg by mouth daily.   Liniments (BLUE-EMU SUPER STRENGTH EX) Apply 1 Application topically daily as needed (pain).   lisinopril-hydrochlorothiazide (ZESTORETIC) 20-25 MG tablet Take 1 tablet by mouth 2 (two) times daily.   magnesium oxide (MAG-OX) 400 MG tablet Take 400 mg by mouth daily.   Multiple Vitamins-Minerals (MULTIVITAMIN WITH MINERALS) tablet Take 1 tablet by mouth daily.   naproxen sodium (ALEVE) 220 MG tablet Take 220 mg by mouth See admin instructions. Take 220 mg in the morning, may take a second 220 mg dose as needed for pain   Omega 3 1000 MG CAPS Take 1,000 mg by mouth daily.   OVER THE COUNTER MEDICATION Nano CBD OIL: Pt takes 20 drops in the morning and 20 drops in the evening.   rosuvastatin (CRESTOR) 10 MG tablet Take 1 tablet (10 mg total) by mouth daily.   sildenafil (VIAGRA) 50 MG tablet Take 50 mg by mouth daily as needed for erectile dysfunction.   zinc gluconate 50 MG tablet Take 50 mg by mouth daily.   No facility-administered encounter  medications on file as of 03/05/2022.    Allergies (verified) Patient has no known allergies.   History: Past Medical History:  Diagnosis Date   Acne    Bipolar 1 disorder ([place])    Depression    Panic attacks    Past Surgical History:  Procedure Laterality Date   MOHS SURGERY  1990   STRABISMUS SURGERY Left    TONSILLECTOMY AND ADENOIDECTOMY     Family History  Problem Relation Age of Onset   Heart disease Mother    Diabetes Mother    Obesity Mother    Hyperlipidemia Mother    Cancer Father        skin  cancer   Heart disease Father    Hyperlipidemia Father    Social History   Socioeconomic History   Marital status: Soil scientist    Spouse name: Not on file   Number of children: 2   Years of education: Not on file   Highest education level: Not on file  Occupational History   Occupation: Administrator, Civil Service    Comment: Part-Time  Tobacco Use   Smoking status: Never   Smokeless tobacco: Current    Types: Chew  Vaping Use   Vaping Use: Never used  Substance and Sexual Activity   Alcohol use: Yes    Comment: occasionanly   Drug use: Never   Sexual activity: Yes  Other Topics Concern   Not on file  Social History Narrative   Not on file   Social Determinants of Health   Financial Resource Strain: Low Risk  (03/05/2022)   Overall Financial Resource Strain (CARDIA)    Difficulty of Paying Living Expenses: Not hard at all  Food Insecurity: No Food Insecurity (03/05/2022)   Hunger Vital Sign    Worried About Running Out of Food in the Last Year: Never true    Ran Out of Food in the Last Year: Never true  Transportation Needs: No Transportation Needs (03/05/2022)   PRAPARE - Hydrologist (Medical): No    Lack of Transportation (Non-Medical): No  Physical Activity: Inactive (03/05/2022)   Exercise Vital Sign    Days of Exercise per Week: 0 days    Minutes of Exercise per Session: 0 min  Stress: No Stress Concern Present (03/05/2022)   [place]    Feeling of Stress : Not at all  Social Connections: Moderately Integrated (03/05/2022)   Social Connection and Isolation Panel [NHANES]    Frequency of Communication with Friends and Family: Three times a week    Frequency of Social Gatherings with Friends and Family: Three times a week    Attends Religious Services: More than 4 times per year    Active Member of Clubs or Organizations: No    Attends Archivist Meetings: Never     Marital Status: Living with partner    Tobacco Counseling Ready to quit: Not Answered Counseling given: Not Answered   Clinical Intake:  Pre-visit preparation completed: Yes  Pain : No/denies pain     Nutritional Risks: None Diabetes: No  How often do you need to have someone help you when you read instructions, pamphlets, or other written materials from your doctor or pharmacy?: 1 - Never What is the last grade level you completed in school?: BS  Diabetic?no   Interpreter Needed?: No  Information entered by :: [place]   Activities of Daily Living    03/05/2022  11:40 AM 03/05/2022   10:34 AM  In your present state of health, do you have any difficulty performing the following activities:  Hearing? 0 0  Vision? 0 0  Difficulty concentrating or making decisions? 0 0  Walking or climbing stairs? 0 1  Dressing or bathing? 0 0  Doing errands, shopping? 0 0  Preparing Food and eating ? N N  Using the Toilet? N N  In the past six months, have you accidently leaked urine? N N  Do you have problems with loss of bowel control? N N  Managing your Medications? N N  Managing your Finances? N N  Housekeeping or managing your Housekeeping? N N    Patient Care Team: Haydee Salter, MD as PCP - General (Family Medicine) Gwynneth Macleod, MD as Psychiatrist (Psychiatry) Sueanne Margarita, MD as Consulting Physician (Sleep Medicine) Freada Bergeron, MD as Consulting Physician (Cardiology)  Indicate any recent Medical Services you may have received from other than [facility] providers in the past year (date may be approximate).     Assessment:   This is a routine wellness examination for Darrel.  Hearing/Vision screen Vision Screening - Comments:: Due eye exam after surgery   Dietary issues and exercise activities discussed: Current Exercise Habits: The patient does not participate in regular exercise at present, Exercise limited by: cardiac condition(s)   Goals  Addressed   None    Depression Screen    03/05/2022   11:40 AM 03/05/2022   11:37 AM 10/17/2021    4:00 PM 02/20/2021    2:46 PM 05/11/2020    3:14 PM 05/11/2020    2:26 PM  PHQ 2/9 Scores  PHQ - 2 Score 0 0 0 0 0 0  PHQ- 9 Score     0     Fall Risk    03/05/2022   11:40 AM 03/05/2022   10:34 AM 01/28/2022    9:37 AM 10/17/2021    4:01 PM 02/20/2021    2:47 PM  [place] in the past year? 0 0 0 0 0  Number falls in past yr: 0 0 0 0 0  Injury with Fall? 0 0 0 0 0  Risk for fall due to :    No Fall Risks   Follow up Falls evaluation completed;Education provided   Falls evaluation completed Falls evaluation completed;Falls prevention discussed    FALL RISK PREVENTION PERTAINING TO THE HOME:  Any stairs in or around the home? Yes  If so, are there any without handrails? No  Home free of loose throw rugs in walkways, pet beds, electrical cords, etc? Yes  Adequate lighting in your home to reduce risk of falls? Yes   ASSISTIVE DEVICES UTILIZED TO PREVENT FALLS:  Life alert? No  Use of a cane, walker or w/c? No  Grab bars in the bathroom? No  Shower chair or bench in shower? No  Elevated toilet seat or a handicapped toilet? No    Cognitive Function:  Normal cognitive status assessed by telephone conversation by this Nurse Health Advisor. No abnormalities found.        Immunizations Immunization History  Administered Date(s) Administered   PFIZER(Purple Top)SARS-COV-2 Vaccination 04/06/2020   PNEUMOCOCCAL CONJUGATE-20 10/29/2021   Tdap 10/29/2021    TDAP status: Up to date  Flu Vaccine status: Declined, Education has been provided regarding the importance of this vaccine but patient still declined. Advised may receive this vaccine at local pharmacy or Health Dept. Aware to provide  a copy of the vaccination record if obtained from local pharmacy or Health Dept. Verbalized acceptance and understanding.  Pneumococcal vaccine status: Up to date  Covid-19 vaccine  status: Completed vaccines  Qualifies for Shingles Vaccine? Yes   Zostavax completed No   Shingrix Completed?: No.    Education has been provided regarding the importance of this vaccine. Patient has been advised to call insurance company to determine out of pocket expense if they have not yet received this vaccine. Advised may also receive vaccine at local pharmacy or Health Dept. Verbalized acceptance and understanding.  Screening Tests Health Maintenance  Topic Date Due   Hepatitis C Screening  Never done   Zoster Vaccines- Shingrix (1 of 2) Never done   COLONOSCOPY (Pts 45-71yr Insurance coverage will need to be confirmed)  Never done   INFLUENZA VACCINE  03/26/2022   TETANUS/TDAP  10/30/2031   Pneumonia Vaccine 66 Years old  Completed   HPV VACCINES  Aged Out   COVID-19 Vaccine  Discontinued    Health Maintenance  Health Maintenance Due  Topic Date Due   Hepatitis C Screening  Never done   Zoster Vaccines- Shingrix (1 of 2) Never done   COLONOSCOPY (Pts 45-42yrInsurance coverage will need to be confirmed)  Never done    Colorectal cancer screening: Referral to GI placed per patient from PCP in October . Pt aware the office will call re: appt.  Lung Cancer Screening: (Low Dose CT Chest recommended if Age 67-80ears, 30 pack-year currently smoking OR have quit w/in 15years.) does not qualify.   Lung Cancer Screening Referral: n/a  Additional Screening:  Hepatitis C Screening: does not qualify; C  Vision Screening: Recommended annual ophthalmology exams for early detection of glaucoma and other disorders of the eye. Is the patient up to date with their annual eye exam?  No  Who is the provider or what is the name of the office in which the patient attends annual eye exams? Patient will schedule after surgery  If pt is not established with a provider, would they like to be referred to a provider to establish care? No .   Dental Screening: Recommended annual dental  exams for proper oral hygiene  Community Resource Referral / Chronic Care Management: CRR required this visit?  No   CCM required this visit?  No      Plan:     I have personally reviewed and noted the following in the patient's chart:   Medical and social history Use of alcohol, tobacco or illicit drugs  Current medications and supplements including opioid prescriptions. Patient is not currently taking opioid prescriptions. Functional ability and status Nutritional status Physical activity Advanced directives List of other physicians Hospitalizations, surgeries, and ER visits in previous 12 months Vitals Screenings to include cognitive, depression, and falls Referrals and appointments  In addition, I have reviewed and discussed with patient certain preventive protocols, quality metrics, and best practice recommendations. A written personalized care plan for preventive services as well as general preventive health recommendations were provided to patient.     LaRandel PiggLPN   03/01/81/9562 Nurse Notes: none

## 2022-03-07 NOTE — Progress Notes (Addendum)
COVID Vaccine received:  '[]'$  No '[x]'$  Yes  Date of any COVID positive Test in last 90 days: None  PCP - Arlester Marker, MD  Cardiologist - Gwyndolyn Kaufman, MD Cardiac clearance- 02-14-22 Epic Christen Bame, NP)  Chest x-ray - none EKG -  11-08-21  Epic Stress Test - 12-14-21  Epic ECHO - 12-14-21 Epic Cardiac Cath - None  Pacemaker/ICD device last checked: Date:  '[x]'$  N/A Spinal Cord Stimulator:'[x]'$  No '[]'$  Yes   Other Implants:   History of Sleep Apnea? '[]'$  No '[x]'$  Yes     Very Mild OSA Sleep Study Date:  12-21-21  Epic CPAP used?- '[x]'$  No '[]'$  Yes  has appt w/ Alfonzo Feller, MD to discuss CPAP use  Does the patient monitor blood sugar? '[x]'$  No '[]'$  Yes  '[]'$  N/A Fasting Blood Sugar Ranges-  Checks Blood Sugar __0   times a day  Blood Thinner Instructions:  none Aspirin Instructions: Last Dose:  Activity level:  Patient can go up a flight of stairs and perform activities of daily living without stopping and without symptoms of chest pain, however, he  does have [facility]   Patient able to complete ADLs without assistance '[x]'$  Yes     Comments: Patient has Bipolar II and severe anxiety.   Anesthesia review: MVP, PVCs, OSA (mild), HTN, Pre-DM  Patient denies shortness of breath, fever, cough and chest pain at PAT appointment.Patient verbalized understanding and agreement to the Pre-Surgical Instructions that were given to them at this PAT appointment. Patient was also educated of the need to review these PAT instructions again prior to his/her surgery.I reviewed the appropriate phone numbers to call if they have any and questions or concerns.

## 2022-03-07 NOTE — Patient Instructions (Addendum)
DUE TO SPACE LIMITATIONS, ONLY TWO VISITORS  (aged 67 and older) ARE ALLOWED TO COME WITH YOU AND STAY IN THE WAITING ROOM DURING YOUR PRE OP AND PROCEDURE.   **NO VISITORS ARE ALLOWED IN THE SHORT STAY AREA OR RECOVERY ROOM!!**  You are not required to quarantine at this time prior to your surgery. However, you must do this: Hand Hygiene often Do NOT share personal items Notify your provider if you are in close contact with someone who has COVID or you develop fever 100.4 or greater, new onset of sneezing, cough, sore throat, shortness of breath or body aches.       Your procedure is scheduled on:  Friday  March 22, 2022  Report to [facility] Main Entrance.  Report to admitting at: 07:45 AM  +++++Call this number if you have any questions or problems the morning of surgery [phone]  Do not eat food :After Midnight the night prior to your surgery/procedure.  After Midnight you may have the following liquids until   07:00  AM DAY OF SURGERY  Clear Liquid Diet Water Black Coffee (sugar ok, NO MILK/CREAM OR CREAMERS)  Tea (sugar ok, NO MILK/CREAM OR CREAMERS) regular and decaf                             Plain Jell-O (NO RED)                                           Fruit ices (not with fruit pulp, NO RED)                                     Popsicles (NO RED)                                                                  Juice: apple, WHITE grape, WHITE cranberry Sports drinks like Gatorade (NO RED) Clear broth(vegetable,chicken,beef)              If you have questions, please contact your surgeon's office.   FOLLOW ANY ADDITIONAL PRE OP INSTRUCTIONS YOU RECEIVED FROM YOUR SURGEON'S OFFICE!!!   Oral Hygiene is also important to reduce your risk of infection.        Remember - BRUSH YOUR TEETH THE MORNING OF SURGERY WITH YOUR REGULAR TOOTHPASTE   Take ONLY these medicines the morning of surgery with A SIP OF WATER: Lamotrigne ( Lamictal)                You may  not have any metal on your body including jewelry, and body piercing  Do not wear lotions, powders, cologne, or deodorant  Men may shave face and neck.  DO NOT [place]. PHARMACY WILL DISPENSE MEDICATIONS LISTED ON YOUR MEDICATION LIST TO YOU DURING YOUR ADMISSION [place]!   Patients discharged on the day of surgery will not be allowed to drive home.  Someone NEEDS to stay with you for the first 24 hours after anesthesia.  Special  Instructions: Bring a copy of your healthcare power of attorney and living will documents the day of surgery, if you wish to have them scanned into your [facility] Records- EPIC  Please read over the following fact sheets you were given: IF YOU HAVE QUESTIONS ABOUT YOUR PRE-OP INSTRUCTIONS, PLEASE CALL [phone]  ([place])   [place] - Preparing for Surgery Before surgery, you can play an important role.  Because skin is not sterile, your skin needs to be as free of germs as possible.  You can reduce the number of germs on your skin by washing with CHG (chlorahexidine gluconate) soap before surgery.  CHG is an antiseptic cleaner which kills germs and bonds with the skin to continue killing germs even after washing. Please DO NOT use if you have an allergy to CHG or antibacterial soaps.  If your skin becomes reddened/irritated stop using the CHG and inform your nurse when you arrive at Short Stay. Do not shave (including legs and underarms) for at least 48 hours prior to the first CHG shower.  You may shave your face/neck.  Please follow these instructions carefully:  1.  Shower with CHG Soap the night before surgery and the  morning of surgery.  2.  If you choose to wash your hair, wash your hair first as usual with your normal  shampoo.  3.  After you shampoo, rinse your hair and body thoroughly to remove the shampoo.                             4.  Use CHG as you would any other liquid soap.  You can apply chg  directly to the skin and wash.  Gently with a scrungie or clean washcloth.  5.  Apply the CHG Soap to your body ONLY FROM THE NECK DOWN.   Do not use on face/ open                           Wound or open sores. Avoid contact with eyes, ears mouth and genitals (private parts).                       Wash face,  Genitals (private parts) with your normal soap.             6.  Wash thoroughly, paying special attention to the area where your  surgery  will be performed.  7.  Thoroughly rinse your body with warm water from the neck down.  8.  DO NOT shower/wash with your normal soap after using and rinsing off the CHG Soap.            9.  Pat yourself dry with a clean towel.            10.  Wear clean pajamas.            11.  Place clean sheets on your bed the night of your first shower and do not  sleep with pets.  ON THE DAY OF SURGERY : Do not apply any lotions/deodorants the morning of surgery.  Please wear clean clothes to the hospital/surgery center.    FAILURE TO FOLLOW THESE INSTRUCTIONS MAY RESULT IN THE CANCELLATION OF YOUR SURGERY  PATIENT SIGNATURE_________________________________  NURSE SIGNATURE__________________________________  ________________________________________________________________________

## 2022-03-08 ENCOUNTER — Encounter (HOSPITAL_COMMUNITY): Payer: Self-pay

## 2022-03-08 ENCOUNTER — Encounter (HOSPITAL_COMMUNITY)
Admission: RE | Admit: 2022-03-08 | Discharge: 2022-03-08 | Disposition: A | Discharge status: Home / Self Care | Payer: Medicare Other | Source: Ambulatory Visit | Attending: Urology | Admitting: Urology

## 2022-03-08 ENCOUNTER — Other Ambulatory Visit: Payer: Self-pay

## 2022-03-08 VITALS — BP 143/90 | HR 98 | Temp 99.1°F | Resp 20 | Ht 71.0 in | Wt 310.0 lb

## 2022-03-08 DIAGNOSIS — I10 Essential (primary) hypertension: Secondary | ICD-10-CM | POA: Diagnosis not present

## 2022-03-08 DIAGNOSIS — Z01812 Encounter for preprocedural laboratory examination: Secondary | ICD-10-CM | POA: Insufficient documentation

## 2022-03-08 DIAGNOSIS — R7303 Prediabetes: Secondary | ICD-10-CM | POA: Insufficient documentation

## 2022-03-08 HISTORY — DX: Unspecified osteoarthritis, unspecified site: M19.90

## 2022-03-08 HISTORY — DX: Nonrheumatic mitral (valve) prolapse: I34.1

## 2022-03-08 HISTORY — DX: Inflammatory liver disease, unspecified: K75.9

## 2022-03-08 HISTORY — DX: Malignant (primary) neoplasm, unspecified: C80.1

## 2022-03-08 HISTORY — DX: Cardiac arrhythmia, unspecified: I49.9

## 2022-03-08 HISTORY — DX: Sleep apnea, unspecified: G47.30

## 2022-03-08 HISTORY — DX: Prediabetes: R73.03

## 2022-03-08 LAB — BASIC METABOLIC PANEL
Anion gap: 9 (ref 5–15)
BUN: 45 mg/dL — ABNORMAL HIGH (ref 8–23)
CO2: 25 mmol/L (ref 22–32)
Calcium: 9.8 mg/dL (ref 8.9–10.3)
Chloride: 105 mmol/L (ref 98–111)
Creatinine, Ser: 1.33 mg/dL — ABNORMAL HIGH (ref 0.61–1.24)
GFR, Estimated: 59 mL/min — ABNORMAL LOW (ref >60)
Glucose, Bld: 128 mg/dL — ABNORMAL HIGH (ref 70–99)
Potassium: 4.2 mmol/L (ref 3.5–5.1)
Sodium: 139 mmol/L (ref 135–145)

## 2022-03-08 LAB — CBC
HCT: 40.1 % (ref 39.0–52.0)
Hemoglobin: 13.5 g/dL (ref 13.0–17.0)
MCH: 32 pg (ref 26.0–34.0)
MCHC: 33.7 g/dL (ref 30.0–36.0)
MCV: 95 fL (ref 80.0–100.0)
Platelets: 195 10*3/uL (ref 150–400)
RBC: 4.22 MIL/uL (ref 4.22–5.81)
RDW: 12.8 % (ref 11.5–15.5)
WBC: 5.1 10*3/uL (ref 4.0–10.5)
nRBC: 0 % (ref 0.0–0.2)

## 2022-03-08 LAB — HEMOGLOBIN A1C
Hgb A1c MFr Bld: 6.4 % — ABNORMAL HIGH (ref 4.8–5.6)
Mean Plasma Glucose: 136.98 mg/dL

## 2022-03-08 LAB — GLUCOSE, CAPILLARY: Glucose-Capillary: 146 mg/dL — ABNORMAL HIGH (ref 70–99)

## 2022-03-12 NOTE — Progress Notes (Signed)
Anesthesia Chart Review   Case: [id number] Date/Time: 03/22/22 0945   Procedure: HYDROCELECTOMY ADULT (Bilateral) - 2 HRS   Anesthesia type: Spinal   Pre-op diagnosis: BILATERAL HYDROCELES   Location: [facility] / [facility]   Surgeons: Bryan Fries, MD       DISCUSSION:66 y.o. never smoker with h/o sleep apnea, MVP, PVCs, bilateral hydroceles scheduled for above procedure 03/22/2022 with Dr. Jacalyn Brock.   Pt seen by cardiology 02/14/2022. Per OV note, "Preoperative cardiac evaluation: Nuclear stress test 12/14/2021 shows no evidence of ischemia and echo shows normal heart function. he is doing well from a cardiac perspective and may proceed to surgery without further testing. According to the Revised Cardiac Risk Index (RCRI), his Perioperative Risk of Major Cardiac Event is (%): 0.4. His Functional Capacity in METs is: 6.05 according to the Duke Activity Status Index (DASI)."  Anticipate pt can proceed with planned procedure barring acute status change.   VS: BP (!) 143/90   Pulse 98   Temp 37.3 C (Oral)   Resp 20   Ht '5\' 11"'$  (1.803 m)   Wt (!) 140.6 kg   SpO2 98%   BMI 43.24 kg/m   PROVIDERS: Bryan Salter, MD is PCP   Bryan Kaufman, MD is Cardiologist  LABS: Labs reviewed: Acceptable for surgery. (all labs ordered are listed, but only abnormal results are displayed)  Labs Reviewed  HEMOGLOBIN A1C - Abnormal; Notable for the following components:      Result Value   Hgb A1c MFr Bld 6.4 (*)    All other components within normal limits  BASIC METABOLIC PANEL - Abnormal; Notable for the following components:   Glucose, Bld 128 (*)    BUN 45 (*)    Creatinine, Ser 1.33 (*)    GFR, Estimated 59 (*)    All other components within normal limits  GLUCOSE, CAPILLARY - Abnormal; Notable for the following components:   Glucose-Capillary 146 (*)    All other components within normal limits  CBC     IMAGES:   EKG:   CV: Echo 12/14/21 1. Left ventricular  ejection fraction, by estimation, is 65 to 70%. The  left ventricle has normal function. The left ventricle has no regional  wall motion abnormalities. There is mild left ventricular hypertrophy.  Left ventricular diastolic parameters  are consistent with Grade I diastolic dysfunction (impaired relaxation).  The average left ventricular global longitudinal strain is -19.8 %. The  global longitudinal strain is normal.   2. Right ventricular systolic function is normal. The right ventricular  size is normal.   3. The mitral valve leaflets are elongated - no obvious prolapse. The  mitral valve is abnormal. Trivial mitral valve regurgitation.   4. The aortic valve is tricuspid. Aortic valve regurgitation is not  visualized.   5. The inferior vena cava is normal in size with greater than 50%  respiratory variability, suggesting right atrial pressure of 3 mmHg.   6. Cannot exclude a small PFO.   Myocardial Perfusion 12/14/2021    Findings are consistent with no prior ischemia and no prior myocardial infarction. The study is intermediate risk.   No ST deviation was noted.   Left ventricular function is abnormal. Nuclear stress EF: 48 %. The left ventricular ejection fraction is mildly decreased (45-54%). End diastolic cavity size is normal. End systolic cavity size is normal.   Prior study not available for comparison.   Findings: Negative for stress induced arrhythmias. No evidence of ischemia  or infarction. LV function is mildly reduced.   Conclusions: Stress test is negative. Intermediate risk due to decrease LV function, thought function look better on concomitant echo. Past Medical History:  Diagnosis Date   Acne    Arthritis    Bipolar 1 disorder ([place])    Cancer ([place])    basal cell cancer removed from back   Depression    Dysrhythmia    PVCs   Hepatitis    remote hx Hepatitis A   Mitral valve prolapse    Panic attacks    Pre-diabetes    Sleep apnea     Past Surgical  History:  Procedure Laterality Date   MOHS SURGERY  1990   STRABISMUS SURGERY Left    TONSILLECTOMY AND ADENOIDECTOMY     WISDOM TOOTH EXTRACTION      MEDICATIONS:  APPLE CIDER VINEGAR PO   Ascorbic Acid (VITAMIN C) 1000 MG tablet   calcium elemental as carbonate (TUMS ULTRA 1000) 400 MG chewable tablet   Cholecalciferol (DIALYVITE VITAMIN D 5000) 125 MCG (5000 UT) capsule   Coenzyme Q10 (COQ-10) 100 MG CAPS   Flaxseed, Linseed, (FLAX SEED OIL) 4967 MG CAPS   folic acid (FOLVITE) 591 MCG tablet   lamoTRIgine (LAMICTAL) 100 MG tablet   Liniments (BLUE-EMU SUPER STRENGTH EX)   lisinopril-hydrochlorothiazide (ZESTORETIC) 20-25 MG tablet   magnesium oxide (MAG-OX) 400 MG tablet   Multiple Vitamins-Minerals (MULTIVITAMIN WITH MINERALS) tablet   naproxen sodium (ALEVE) 220 MG tablet   Omega 3 1000 MG CAPS   OVER THE COUNTER MEDICATION   rosuvastatin (CRESTOR) 10 MG tablet   sildenafil (VIAGRA) 50 MG tablet   zinc gluconate 50 MG tablet   No current facility-administered medications for this encounter.    Bryan Felix Ward, PA-C [facility] [phone]

## 2022-03-12 NOTE — Anesthesia Preprocedure Evaluation (Addendum)
Anesthesia Evaluation  Patient identified by MRN, date of birth, ID band Patient awake    Reviewed: Allergy & Precautions, NPO status , Patient's Chart, lab work & pertinent test results  Airway Mallampati: II  TM Distance: >3 FB Neck ROM: Full    Dental no notable dental hx.    Pulmonary sleep apnea , Patient abstained from smoking.,    Pulmonary exam normal breath sounds clear to auscultation       Cardiovascular hypertension, Normal cardiovascular exam Rhythm:Regular Rate:Normal     Neuro/Psych Anxiety Depression Bipolar Disorder negative neurological ROS  negative psych ROS   GI/Hepatic negative GI ROS, Neg liver ROS,   Endo/Other  Morbid obesity  Renal/GU negative Renal ROS  negative genitourinary   Musculoskeletal  (+) Arthritis , Osteoarthritis,    Abdominal (+) + obese,   Peds negative pediatric ROS (+)  Hematology negative hematology ROS (+)   Anesthesia Other Findings   Reproductive/Obstetrics negative OB ROS                             Anesthesia Physical Anesthesia Plan  ASA: 3  Anesthesia Plan: General   Post-op Pain Management: Minimal or no pain anticipated   Induction: Intravenous  PONV Risk Score and Plan: 2 and Ondansetron, Treatment may vary due to age or medical condition and Midazolam  Airway Management Planned: LMA  Additional Equipment:   Intra-op Plan:   Post-operative Plan: Extubation in OR  Informed Consent: I have reviewed the patients History and Physical, chart, labs and discussed the procedure including the risks, benefits and alternatives for the proposed anesthesia with the patient or authorized representative who has indicated his/her understanding and acceptance.     Dental advisory given  Plan Discussed with: CRNA  Anesthesia Plan Comments: (See PAT note 03/08/2022)     Anesthesia Quick Evaluation

## 2022-03-21 NOTE — H&P (Signed)
CC/HPI: cc: hydroceles   10/05/21: 66 year old man referred for bilateral hydroceles. Patient reports scrotal swelling that occurred in December 2022. No inciting factor is known. He feels like it is approximately twice the size of his normal scrotum. It is impacting daily activity and sexual activity. It is very comfortable to sit down. He has not had prior surgery on scrotum and does not take any blood thinners. He is a retired Statistician.   03/08/2022: Patient had bilateral hydrocelectomy performed on 7/28 with Dr. Claudia Desanctis. Here today for preoperative appointment. Now on lisinopril as well as statin for management of underlying hypertension and cholesterol. Denies any other changes in past medical history. He has seen dramatic improvement especially with hypertension control and lowered lipid panel since starting the medication. Denies any recent surgical or procedural intervention. Denies any new or worsening changes in baseline lower urinary tract symptoms. He has not had any recent dysuria, gross hematuria or interval treatment for UTI. Patient denies any recent fever/chills, nausea/vomiting, chest pain, shortness of breath, lightheadedness or dizziness.     ALLERGIES: None   MEDICATIONS: Cbd Oil  Co Q10  Flaxseed Oil  Folic Acid  Lamotrigine 100 mg tablet  Lisinopril-Hydrochlorothiazide 20 mg-12.5 mg tablet  Magnesium 400 mg magnesium tablet  Multivitamin  Naproxen Sodium 220 mg tablet  Omega-3 Fish Oil  Rosuvastatin Calcium 10 mg tablet  Sildenafil Citrate  Vitamin C  Vitamin D3  Zinc     GU PSH: None     PSH Notes: Strabismus surgery (~1959), Tonsillectomy (1959)   NON-GU PSH: None   GU PMH: Hydrocele - 10/05/2021    NON-GU PMH: Anxiety Arrhythmia Cardiac murmur, unspecified Depression GERD Hypertension Skin Cancer, History    FAMILY HISTORY: 1 Daughter - Daughter 1 son - Son Atrial Fibrillation - Mother Diabetes - Mother Heart Disease - Father    SOCIAL HISTORY: Marital Status: Divorced Preferred Language: English; Ethnicity: Not Hispanic Or Latino; Race: White Current Smoking Status: Patient does not smoke anymore. Has not smoked since 05/26/2021. Smoked for 49 years.   Tobacco Use Assessment Completed: Used Tobacco in last 30 days? Does drink.  Drinks 3 caffeinated drinks per day. 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     Notes: Smoking status: Former smoker; uses other form of tobacco: dip/snuff   REVIEW OF SYSTEMS:    GU Review Male:   Patient reports frequent urination, hard to postpone urination, and get up at night to urinate. Patient denies burning/ pain with urination, leakage of urine, stream starts and stops, trouble starting your stream, have to strain to urinate , erection problems, and penile pain.  Gastrointestinal (Upper):   Patient denies vomiting, indigestion/ heartburn, and nausea.  Gastrointestinal (Lower):   Patient denies diarrhea and constipation.  Constitutional:   Patient reports fatigue. Patient denies fever, night sweats, and weight loss.  Skin:   Patient denies skin rash/ lesion and itching.  Eyes:   Patient denies blurred vision and double vision.  Ears/ Nose/ Throat:   Patient denies sore throat and sinus problems.  Hematologic/Lymphatic:   Patient denies swollen glands and easy bruising.  Cardiovascular:   Patient denies leg swelling and chest pains.  Respiratory:   Patient denies cough and shortness of breath.  Endocrine:   Patient denies excessive thirst.  Musculoskeletal:   Patient reports joint pain. Patient denies back pain.  Neurological:   Patient denies headaches and dizziness.  Psychologic:   Patient denies depression and anxiety.   VITAL SIGNS:  03/08/2022 02:46 PM  BP 120/72 mmHg  Pulse 99 /min  Temperature 97.3 F / 36.2 C   MULTI-SYSTEM PHYSICAL EXAMINATION:    Constitutional: Well-nourished. No physical deformities. Normally developed. Good  grooming.  Neck: Neck symmetrical, not swollen. Normal tracheal position.  Respiratory: No labored breathing, no use of accessory muscles.   Cardiovascular: Normal temperature, normal extremity pulses, no swelling, no varicosities.  Skin: No paleness, no jaundice, no cyanosis. No lesion, no ulcer, no rash.  Neurologic / Psychiatric: Oriented to time, oriented to place, oriented to person. No depression, no anxiety, no agitation.  Gastrointestinal: Obese abdomen. No mass, no tenderness, no rigidity.   Musculoskeletal: Normal gait and station of head and neck.     Complexity of Data:  Source Of History:  Patient, Medical Record Summary  Records Review:   Previous Doctor Records, Previous Hospital Records, Previous Patient Records  Urine Test Review:   Urinalysis  X-Ray Review: Scrotal Ultrasound: Reviewed Films. Reviewed Report.     03/08/22  Urinalysis  Urine Appearance Clear   Urine Color Yellow   Urine Glucose Neg mg/dL  Urine Bilirubin Neg mg/dL  Urine Ketones Neg mg/dL  Urine Specific Gravity 1.020   Urine Blood Neg ery/uL  Urine pH 6.5   Urine Protein Neg mg/dL  Urine Urobilinogen 0.2 mg/dL  Urine Nitrites Neg   Urine Leukocyte Esterase Neg leu/uL   PROCEDURES:          Urinalysis Dipstick Dipstick Cont'd  Color: Yellow Bilirubin: Neg mg/dL  Appearance: Clear Ketones: Neg mg/dL  Specific Gravity: 1.020 Blood: Neg ery/uL  pH: 6.5 Protein: Neg mg/dL  Glucose: Neg mg/dL Urobilinogen: 0.2 mg/dL    Nitrites: Neg    Leukocyte Esterase: Neg leu/uL    ASSESSMENT:      ICD-10 Details  1 GU:   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 He's drinking water and will stop by the lab to leave a voided urine specimen prior to leaving. Urine culture sent today to serve as a baseline. He will proceed with previously scheduled bilateral hydrocelectomy on 7/28 with his urologist.

## 2022-03-22 ENCOUNTER — Encounter (HOSPITAL_COMMUNITY): Payer: Self-pay | Admitting: Urology

## 2022-03-22 ENCOUNTER — Ambulatory Visit (HOSPITAL_COMMUNITY): Payer: Medicare Other | Admitting: Physician Assistant

## 2022-03-22 ENCOUNTER — Encounter (HOSPITAL_COMMUNITY)
Admission: RE | Disposition: A | Discharge status: Home / Self Care | Payer: Self-pay | Source: Home / Self Care | Attending: Urology

## 2022-03-22 ENCOUNTER — Ambulatory Visit (HOSPITAL_BASED_OUTPATIENT_CLINIC_OR_DEPARTMENT_OTHER): Payer: Medicare Other | Admitting: Anesthesiology

## 2022-03-22 ENCOUNTER — Ambulatory Visit (HOSPITAL_COMMUNITY)
Admission: RE | Admit: 2022-03-22 | Discharge: 2022-03-22 | Disposition: A | Discharge status: Home / Self Care | Payer: Medicare Other | Attending: Urology | Admitting: Urology

## 2022-03-22 DIAGNOSIS — Z87891 Personal history of nicotine dependence: Secondary | ICD-10-CM | POA: Diagnosis not present

## 2022-03-22 DIAGNOSIS — I10 Essential (primary) hypertension: Secondary | ICD-10-CM | POA: Insufficient documentation

## 2022-03-22 DIAGNOSIS — G4730 Sleep apnea, unspecified: Secondary | ICD-10-CM

## 2022-03-22 DIAGNOSIS — F319 Bipolar disorder, unspecified: Secondary | ICD-10-CM | POA: Insufficient documentation

## 2022-03-22 DIAGNOSIS — F418 Other specified anxiety disorders: Secondary | ICD-10-CM

## 2022-03-22 DIAGNOSIS — N433 Hydrocele, unspecified: Secondary | ICD-10-CM | POA: Insufficient documentation

## 2022-03-22 DIAGNOSIS — M1990 Unspecified osteoarthritis, unspecified site: Secondary | ICD-10-CM | POA: Diagnosis not present

## 2022-03-22 DIAGNOSIS — Z8249 Family history of ischemic heart disease and other diseases of the circulatory system: Secondary | ICD-10-CM | POA: Insufficient documentation

## 2022-03-22 DIAGNOSIS — R7303 Prediabetes: Secondary | ICD-10-CM

## 2022-03-22 HISTORY — PX: HYDROCELE EXCISION: SHX482

## 2022-03-22 SURGERY — HYDROCELECTOMY
Anesthesia: General | Site: Scrotum | Laterality: Bilateral

## 2022-03-22 MED ORDER — TRAMADOL HCL 50 MG PO TABS: 50.0000 mg | ORAL_TABLET | Freq: Four times a day (QID) | ORAL | 0 refills | Status: DC | PRN

## 2022-03-22 MED ORDER — MIDAZOLAM HCL 2 MG/2ML IJ SOLN
INTRAMUSCULAR | Status: AC
  Filled 2022-03-22: qty 2

## 2022-03-22 MED ORDER — ONDANSETRON HCL 4 MG/2ML IJ SOLN
INTRAMUSCULAR | Status: AC
  Filled 2022-03-22: qty 2

## 2022-03-22 MED ORDER — FENTANYL CITRATE (PF) 100 MCG/2ML IJ SOLN
INTRAMUSCULAR | Status: DC | PRN
  Administered 2022-03-22: 50 ug via INTRAVENOUS
  Administered 2022-03-22: 100 ug via INTRAVENOUS
  Administered 2022-03-22: 50 ug via INTRAVENOUS

## 2022-03-22 MED ORDER — AMISULPRIDE (ANTIEMETIC) 5 MG/2ML IV SOLN: 10.0000 mg | Freq: Once | INTRAVENOUS | Status: DC | PRN

## 2022-03-22 MED ORDER — BUPIVACAINE HCL (PF) 0.25 % IJ SOLN
INTRAMUSCULAR | Status: DC | PRN
  Administered 2022-03-22: 20 mL

## 2022-03-22 MED ORDER — FENTANYL CITRATE (PF) 100 MCG/2ML IJ SOLN
INTRAMUSCULAR | Status: AC
  Filled 2022-03-22: qty 2

## 2022-03-22 MED ORDER — SUCCINYLCHOLINE CHLORIDE 200 MG/10ML IV SOSY
PREFILLED_SYRINGE | INTRAVENOUS | Status: AC
  Filled 2022-03-22: qty 10

## 2022-03-22 MED ORDER — SUGAMMADEX SODIUM 500 MG/5ML IV SOLN
INTRAVENOUS | Status: DC | PRN
  Administered 2022-03-22: 400 mg via INTRAVENOUS

## 2022-03-22 MED ORDER — 0.9 % SODIUM CHLORIDE (POUR BTL) OPTIME
TOPICAL | Status: DC | PRN
  Administered 2022-03-22: 1000 mL

## 2022-03-22 MED ORDER — OXYCODONE HCL 5 MG PO TABS
5.0000 mg | ORAL_TABLET | Freq: Once | ORAL | Status: AC | PRN
  Administered 2022-03-22: 5 mg via ORAL

## 2022-03-22 MED ORDER — ONDANSETRON HCL 4 MG/2ML IJ SOLN
INTRAMUSCULAR | Status: DC | PRN
  Administered 2022-03-22: 4 mg via INTRAVENOUS

## 2022-03-22 MED ORDER — ROCURONIUM BROMIDE 10 MG/ML (PF) SYRINGE
PREFILLED_SYRINGE | INTRAVENOUS | Status: DC | PRN
  Administered 2022-03-22: 10 mg via INTRAVENOUS
  Administered 2022-03-22: 50 mg via INTRAVENOUS

## 2022-03-22 MED ORDER — ORAL CARE MOUTH RINSE: 15.0000 mL | Freq: Once | OROMUCOSAL | Status: AC

## 2022-03-22 MED ORDER — LIDOCAINE HCL (PF) 2 % IJ SOLN
INTRAMUSCULAR | Status: AC
  Filled 2022-03-22: qty 5

## 2022-03-22 MED ORDER — MIDAZOLAM HCL 5 MG/5ML IJ SOLN
INTRAMUSCULAR | Status: DC | PRN
  Administered 2022-03-22: 2 mg via INTRAVENOUS

## 2022-03-22 MED ORDER — CHLORHEXIDINE GLUCONATE 0.12 % MT SOLN
15.0000 mL | Freq: Once | OROMUCOSAL | Status: AC
  Administered 2022-03-22: 15 mL via OROMUCOSAL

## 2022-03-22 MED ORDER — BUPIVACAINE HCL 0.25 % IJ SOLN
INTRAMUSCULAR | Status: AC
  Filled 2022-03-22: qty 1

## 2022-03-22 MED ORDER — HYDROMORPHONE HCL 1 MG/ML IJ SOLN
INTRAMUSCULAR | Status: AC
  Filled 2022-03-22: qty 1

## 2022-03-22 MED ORDER — PROPOFOL 10 MG/ML IV BOLUS
INTRAVENOUS | Status: AC
Start: 2022-03-22 — End: ?
  Filled 2022-03-22: qty 20

## 2022-03-22 MED ORDER — PROPOFOL 10 MG/ML IV BOLUS
INTRAVENOUS | Status: AC
  Filled 2022-03-22: qty 20

## 2022-03-22 MED ORDER — LIDOCAINE HCL (CARDIAC) PF 100 MG/5ML IV SOSY
PREFILLED_SYRINGE | INTRAVENOUS | Status: DC | PRN
  Administered 2022-03-22: 100 mg via INTRAVENOUS

## 2022-03-22 MED ORDER — SUGAMMADEX SODIUM 500 MG/5ML IV SOLN
INTRAVENOUS | Status: AC
  Filled 2022-03-22: qty 5

## 2022-03-22 MED ORDER — OXYCODONE HCL 5 MG/5ML PO SOLN: 5.0000 mg | Freq: Once | ORAL | Status: AC | PRN

## 2022-03-22 MED ORDER — PROPOFOL 10 MG/ML IV BOLUS
INTRAVENOUS | Status: DC | PRN
  Administered 2022-03-22: 170 mg via INTRAVENOUS

## 2022-03-22 MED ORDER — LACTATED RINGERS IV SOLN: INTRAVENOUS | Status: DC

## 2022-03-22 MED ORDER — PROMETHAZINE HCL 25 MG/ML IJ SOLN: 6.2500 mg | INTRAMUSCULAR | Status: DC | PRN

## 2022-03-22 MED ORDER — MEPERIDINE HCL 50 MG/ML IJ SOLN: 6.2500 mg | INTRAMUSCULAR | Status: DC | PRN

## 2022-03-22 MED ORDER — HYDROMORPHONE HCL 1 MG/ML IJ SOLN
0.2500 mg | INTRAMUSCULAR | Status: DC | PRN
  Administered 2022-03-22 (×2): 0.5 mg via INTRAVENOUS

## 2022-03-22 MED ORDER — CEFAZOLIN IN SODIUM CHLORIDE 3-0.9 GM/100ML-% IV SOLN
3.0000 g | INTRAVENOUS | Status: AC
  Administered 2022-03-22: 3 g via INTRAVENOUS
  Filled 2022-03-22: qty 100

## 2022-03-22 MED ORDER — OXYCODONE HCL 5 MG PO TABS
ORAL_TABLET | ORAL | Status: AC
  Filled 2022-03-22: qty 1

## 2022-03-22 MED ORDER — ROCURONIUM BROMIDE 10 MG/ML (PF) SYRINGE
PREFILLED_SYRINGE | INTRAVENOUS | Status: AC
  Filled 2022-03-22: qty 10

## 2022-03-22 MED ORDER — SUCCINYLCHOLINE CHLORIDE 200 MG/10ML IV SOSY
PREFILLED_SYRINGE | INTRAVENOUS | Status: DC | PRN
  Administered 2022-03-22: 140 mg via INTRAVENOUS

## 2022-03-22 SURGICAL SUPPLY — 44 items
BAG COUNTER SPONGE SURGICOUNT (BAG) ×2 IMPLANT
BLADE CLIPPER SENSICLIP SURGIC (BLADE) ×2 IMPLANT
BLADE HEX COATED 2.75 (ELECTRODE) ×2 IMPLANT
BLADE SURG 15 STRL LF DISP TIS (BLADE) ×1 IMPLANT
BLADE SURG 15 STRL SS (BLADE) ×2
BNDG GAUZE DERMACEA FLUFF (GAUZE/BANDAGES/DRESSINGS) ×1
BNDG GAUZE DERMACEA FLUFF 4 (GAUZE/BANDAGES/DRESSINGS) ×1 IMPLANT
COVER BACK TABLE 60X90IN (DRAPES) ×2 IMPLANT
COVER MAYO STAND STRL (DRAPES) ×2 IMPLANT
DERMABOND ADVANCED (GAUZE/BANDAGES/DRESSINGS) ×1
DERMABOND ADVANCED .7 DNX12 (GAUZE/BANDAGES/DRESSINGS) IMPLANT
DISSECTOR ROUND CHERRY 3/8 STR (MISCELLANEOUS) IMPLANT
DRAIN PENROSE 0.25X18 (DRAIN) ×1 IMPLANT
DRAIN PENROSE LF 8X20.3CM SIL (WOUND CARE) ×2 IMPLANT
DRAPE LAPAROTOMY T 98X78 PEDS (DRAPES) ×2 IMPLANT
DRSG TEGADERM 4X4.75 (GAUZE/BANDAGES/DRESSINGS) IMPLANT
ELECT REM PT RETURN 15FT ADLT (MISCELLANEOUS) ×2 IMPLANT
GAUZE 4X4 16PLY ~~LOC~~+RFID DBL (SPONGE) ×1 IMPLANT
GLOVE BIO SURGEON STRL SZ7.5 (GLOVE) ×1 IMPLANT
GOWN SRG XL LVL 4 BRTHBL STRL (GOWNS) ×1 IMPLANT
GOWN STRL NON-REIN XL LVL4 (GOWNS)
KIT TURNOVER KIT A (KITS) IMPLANT
NDL HYPO 25X1 1.5 SAFETY (NEEDLE) ×1 IMPLANT
NEEDLE HYPO 25X1 1.5 SAFETY (NEEDLE) ×2 IMPLANT
NS IRRIG 1000ML POUR BTL (IV SOLUTION) ×1 IMPLANT
PANTS MESH DISP LRG (UNDERPADS AND DIAPERS) ×2 IMPLANT
PENCIL SMOKE EVACUATOR (MISCELLANEOUS) IMPLANT
SPONGE T-LAP 18X18 ~~LOC~~+RFID (SPONGE) ×1 IMPLANT
SUPPORT SCROTAL LG STRP (MISCELLANEOUS) ×1 IMPLANT
SUT CHROMIC 3 0 SH 27 (SUTURE) ×1 IMPLANT
SUT ETHILON 4 0 PS 2 18 (SUTURE) ×1 IMPLANT
SUT MNCRL AB 4-0 PS2 18 (SUTURE) ×1 IMPLANT
SUT VIC AB 2-0 SH 27 (SUTURE) ×4
SUT VIC AB 2-0 SH 27XBRD (SUTURE) IMPLANT
SUT VIC AB 3-0 SH 27 (SUTURE) ×4
SUT VIC AB 3-0 SH 27X BRD (SUTURE) IMPLANT
SUT VIC AB 3-0 SH 27XBRD (SUTURE) ×1 IMPLANT
SUT VIC AB 4-0 SH 27 (SUTURE)
SUT VIC AB 4-0 SH 27XANBCTRL (SUTURE) ×1 IMPLANT
SYR CONTROL 10ML LL (SYRINGE) ×2 IMPLANT
TOWEL OR 17X26 10 PK STRL BLUE (TOWEL DISPOSABLE) ×3 IMPLANT
TUBING CONNECTING 10 (TUBING) ×2 IMPLANT
WATER STERILE IRR 500ML POUR (IV SOLUTION) IMPLANT
YANKAUER SUCT BULB TIP NO VENT (SUCTIONS) ×2 IMPLANT

## 2022-03-22 NOTE — Anesthesia Postprocedure Evaluation (Signed)
Anesthesia Post Note  Patient: Bryan Brock  Procedure(s) Performed: HYDROCELECTOMY ADULT (Bilateral: Scrotum)     Patient location during evaluation: PACU Anesthesia Type: General Level of consciousness: awake and alert Pain management: pain level controlled Vital Signs Assessment: post-procedure vital signs reviewed and stable Respiratory status: spontaneous breathing, nonlabored ventilation and respiratory function stable Cardiovascular status: blood pressure returned to baseline and stable Postop Assessment: no apparent nausea or vomiting Anesthetic complications: no   No notable events documented.  Last Vitals:  Vitals:   03/22/22 1145 03/22/22 1200  BP: 117/65 122/73  Pulse: 67   Resp: 19 15  Temp:    SpO2: 98%     Last Pain:  Vitals:   03/22/22 1145  TempSrc:   PainSc: 5                  Lynda Rainwater

## 2022-03-22 NOTE — Anesthesia Procedure Notes (Signed)
Procedure Name: Intubation Date/Time: 03/22/2022 9:52 AM  Performed by: Lind Covert, CRNAPre-anesthesia Checklist: Patient identified, Emergency Drugs available, Suction available, Patient being monitored and Timeout performed Patient Re-evaluated:Patient Re-evaluated prior to induction Oxygen Delivery Method: Circle system utilized Preoxygenation: Pre-oxygenation with 100% oxygen Induction Type: IV induction, Rapid sequence and Cricoid Pressure applied Laryngoscope Size: Mac and 4 Grade View: Grade I Tube type: Oral Tube size: 7.5 mm Number of attempts: 1 Airway Equipment and Method: Stylet Placement Confirmation: ETT inserted through vocal cords under direct vision, positive ETCO2 and breath sounds checked- equal and bilateral Secured at: 23 cm Tube secured with: Tape Dental Injury: Teeth and Oropharynx as per pre-operative assessment

## 2022-03-22 NOTE — Interval H&P Note (Signed)
History and Physical Interval Note:  03/22/2022 9:43 AM  Bryan Brock  has presented today for surgery, with the diagnosis of BILATERAL HYDROCELES.  The various methods of treatment have been discussed with the patient and family. After consideration of risks, benefits and other options for treatment, the patient has consented to  Procedure(s) with comments: HYDROCELECTOMY ADULT (Bilateral) - 2 HRS as a surgical intervention.  The patient's history has been reviewed, patient examined, no change in status, stable for surgery.  I have reviewed the patient's chart and labs.  Questions were answered to the patient's satisfaction.     [name] D Charlsey Moragne

## 2022-03-22 NOTE — Transfer of Care (Signed)
Immediate Anesthesia Transfer of Care Note  Patient: Bryan Brock  Procedure(s) Performed: HYDROCELECTOMY ADULT (Bilateral: Scrotum)  Patient Location: PACU  Anesthesia Type:General  Level of Consciousness: sedated  Airway & Oxygen Therapy: Patient Spontanous Breathing and Patient connected to face mask oxygen  Post-op Assessment: Report given to RN and Post -op Vital signs reviewed and stable  Post vital signs: Reviewed and stable  Last Vitals:  Vitals Value Taken Time  BP    Temp    Pulse 86 03/22/22 1109  Resp 18 03/22/22 1109  SpO2 95 % 03/22/22 1109  Vitals shown include unvalidated device data.  Last Pain:  Vitals:   03/22/22 0821  TempSrc: Oral  PainSc:          Complications: No notable events documented.

## 2022-03-22 NOTE — Op Note (Signed)
.  Operative Note  Preoperative diagnosis:  1.  Bilateral hydroceles  Postoperative diagnosis: 1.  Bilateral hydroceles  Procedure(s): 1.  Bilateral hydrocelectomy  Surgeon: Jacalyn Lefevre, MD  Assistants: none  Anesthesia: Gen.  Complications: none immediate  EBL: 25 mL  Specimens: 1. none  Drains/Catheters: 1. Bilateral scrotal drains (penrose)  Condition following The operation: stable  Intraoperative findings: bilateral hydroceles  Indication: Bryan Brock is a 66 year old man with scrotal swelling found to have a bilateral hydroceles confirmed by ultrasound.  Description of procedure:  The patient was identified and consent was obtained. He was taken back to the operating room and placed in the supine position. He was placed under general anesthesia and prepped and draped in the standard sterile fashion.Perioperative antibiotic was given. Timeout was performed. A 4 cm hemiscrotal transverse incision was made. This was carried down through the dartos and the left testicle along with the hydrocele sac was delivered onto the operative field. The gubernacular attachments were released with a combination of blunt dissection and electrocautery. Spot electrocautery was used for hemostasis as necessary. Care was taken to not use electrocautery close to the cord itself. The hydrocele sac was incised and fluid was evacuated. I extended the hydrocele sac incision proximally and distally. Excess sac was excised. This was discarded. The hydrocele sac was everted around the testicle and loosely reapproximated with a 3-0 vicryl. Hemostasis was obtained carefully with Bovie electrocautery. I irrigated the scrotal cavity and there was good hemostasis. Irrigation was used. The testicle was delivered back into the scrotum in its proper anatomical position. A 1/4 latex free penrose drain was placed through the inferior scrotal wall and secured with a stitch.  Attention then turned to the right side and  the same procedure was performed on the right.  After the right testicle was placed in the right hemiscrotum in proper anatomic position the dartos was closed. with a running 2-0 Vicryl. The skin was closed with interrupted 3-0 chromic sutures. 20 mL of quarter percent Marcaine without epinephrine was instilled for anesthetic affect. Dermabond was applied and then a dressing along with scrotal support was applied. This concluded the operation. The patient tolerated the procedure well and was stable postoperatively.  Plan: The patient will return in several weeks for postoperative wound check.

## 2022-03-22 NOTE — Discharge Instructions (Addendum)
Scrotal surgery postoperative instructions  Wound:  In most cases your incision will have absorbable sutures that will dissolve within the first 10-20 days. Some will fall out even earlier. Expect some redness as the sutures dissolved but this should occur only around the sutures. If there is generalized redness, especially with increasing pain or swelling, let us know. The scrotum will very likely get "black and blue" as the blood in the tissues spread. Sometimes the whole scrotum will turn colors. The black and blue is followed by a yellow and brown color. In time, all the discoloration will go away. In some cases some firm swelling in the area of the testicle may persist for up to 4-6 weeks after the surgery and is considered normal in most cases.   You have drainage coming out of each side of your scrotum.  Change the gauze as needed and they will be removed in the office.  If they fall out on their own its okay.  Diet:  You may return to your normal diet within 24 hours following your surgery. You may note some mild nausea and possibly vomiting the first 6-8 hours following surgery. This is usually due to the side effects of anesthesia, and will disappear quite soon. I would suggest clear liquids and a very light meal the first evening following your surgery.  Activity:  Your physical activity should be restricted the first 48 hours. During that time you should remain relatively inactive, moving about only when necessary. During the first 7-10 days following surgery he should avoid lifting any heavy objects (anything greater than 15 pounds), and avoid strenuous exercise. If you work, ask [place] specifically about your restrictions, both for work and home. We will write a note to your employer if needed.  You should plan to wear a tight pair of jockey shorts or an athletic supporter for the first 4-5 days, even to sleep. This will keep the scrotum immobilized to some degree and keep the swelling  down.  Ice packs should be placed on and off over the scrotum for the first 48 hours. Frozen peas or corn in a ZipLock bag can be frozen, used and re-frozen. Fifteen minutes on and 15 minutes off is a reasonable schedule. The ice is a good pain reliever and keeps the swelling down.  Hygiene:  You may shower 48 hours after your surgery. Tub bathing should be restricted until the seventh day.   Medication:  You will be sent home with some type of pain medication. In many cases you will be sent home with a narcotic pain pill (hydrococone or oxycodone). If the pain is not too bad, you may take either Tylenol (acetaminophen) or Advil (ibuprofen) which contain no narcotic agents, and might be tolerated a little better, with fewer side effects. If the pain medication you are sent home with does not control the pain, you will have to let us know. Some narcotic pain medications cannot be given or refilled by a phone call to a pharmacy.  Problems you should report to [place]:  Fever of 101.0 degrees Fahrenheit or greater. Moderate or severe swelling under the skin incision or involving the scrotum. Drug reaction such as hives, a rash, nausea or vomiting.

## 2022-03-23 ENCOUNTER — Encounter (HOSPITAL_COMMUNITY): Payer: Self-pay | Admitting: Urology

## 2022-04-18 ENCOUNTER — Telehealth (INDEPENDENT_AMBULATORY_CARE_PROVIDER_SITE_OTHER): Payer: Medicare Other | Admitting: Cardiology

## 2022-04-18 ENCOUNTER — Encounter: Payer: Self-pay | Admitting: Cardiology

## 2022-04-18 VITALS — BP 153/86 | HR 94 | Ht 71.0 in | Wt 303.0 lb

## 2022-04-18 DIAGNOSIS — I10 Essential (primary) hypertension: Secondary | ICD-10-CM

## 2022-04-18 DIAGNOSIS — G4733 Obstructive sleep apnea (adult) (pediatric): Secondary | ICD-10-CM | POA: Diagnosis not present

## 2022-04-18 NOTE — Patient Instructions (Signed)
Medication Instructions:  Your physician recommends that you continue on your current medications as directed. Please refer to the Current Medication list given to you today.  *If you need a refill on your cardiac medications before your next appointment, please call your pharmacy*   Lab Work: None ordered  If you have labs (blood work) drawn today and your tests are completely normal, you will receive your results only by: [place] (if you have MyChart) OR A paper copy in the mail If you have any lab test that is abnormal or we need to change your treatment, we will call you to review the results.   Testing/Procedures: None ordered   Follow-Up: At [facility], you and your health needs are our priority.  As part of our continuing mission to provide you with exceptional heart care, we have created designated Provider Care Teams.  These Care Teams include your primary Cardiologist (physician) and Advanced Practice Providers (APPs -  Physician Assistants and Nurse Practitioners) who all work together to provide you with the care you need, when you need it.  We recommend signing up for the patient portal called "MyChart".  Sign up information is provided on this After Visit Summary.  MyChart is used to connect with patients for Virtual Visits (Telemedicine).  Patients are able to view lab/test results, encounter notes, upcoming appointments, etc.  Non-urgent messages can be sent to your provider as well.   To learn more about what you can do with MyChart, go to [email or web address].    Your next appointment:   As Needed  The format for your next appointment:     Provider:       Other Instructions   Important Information About Sugar

## 2022-04-18 NOTE — Progress Notes (Signed)
SLEEP MEDICINE VIRTUAL CONSULT NOTE via Video Note   Because of Bryan Brock's co-morbid illnesses, he is at least at moderate risk for complications without adequate follow up.  This format is felt to be most appropriate for this patient at this time.  All issues noted in this document were discussed and addressed.  A limited physical exam was performed with this format.  Please refer to the patient's chart for his consent to telehealth for [facility].   Date:  04/18/2022   ID:  Bryan Brock, DOB 12-19-1955, MRN [id number] The patient was identified using 2 identifiers.  Patient Location: Home Provider Location: Office/Clinic   PCP:  Haydee Salter, MD   [place] [facility] Cardiologist:  Gwyndolyn Kaufman, MD  Evaluation Performed:  New Patient Evaluation  Chief Complaint: Obstructive sleep apnea  History of Present Illness:    Bryan Brock is a 66 y.o. male who is being seen today for the evaluation of obstructive sleep apnea at the request of Gwyndolyn Kaufman, MD.  Bryan Brock is a 66 y.o. male  with a history of bipolar disorder, depression, PVCs, mitral valve prolapse, panic attacks and recent palpitations.  He was seen by Dr. Johney Frame and due to his palpitations and morbid obesity was recommended that he undergo asleep study.   He tells me that he does not have any issues with excessive daytime sleepiness.  He sleeps very well at night. He underwent in lab sleep study which demonstrated mild obstructive sleep apnea with an AHI of 8.2/h with O2 saturations as low as 86% with moderate snoring.  He is now here to discuss the results and further recommendations on treatment.  Past Medical History:  Diagnosis Date   Acne    Arthritis    Bipolar 1 disorder ([place])    Cancer ([place])    basal cell cancer removed from back   Depression    Dysrhythmia    PVCs   Hepatitis    remote hx Hepatitis A   Mitral valve prolapse    Panic attacks    Pre-diabetes     Sleep apnea    Past Surgical History:  Procedure Laterality Date   HYDROCELE EXCISION Bilateral 03/22/2022   Procedure: HYDROCELECTOMY ADULT;  Surgeon: Robley Fries, MD;  Location: [facility];  Service: Urology;  Laterality: Bilateral;  2 HRS   MOHS SURGERY  1990   STRABISMUS SURGERY Left    TONSILLECTOMY AND ADENOIDECTOMY     WISDOM TOOTH EXTRACTION       Current Meds  Medication Sig   APPLE CIDER VINEGAR PO Take 15 mLs by mouth every Monday, Wednesday, and Friday.   Ascorbic Acid (VITAMIN C) 1000 MG tablet Take 3,000 mg by mouth daily.   calcium elemental as carbonate (TUMS ULTRA 1000) 400 MG chewable tablet Chew 2,000 mg by mouth 3 (three) times daily as needed for heartburn.   Cholecalciferol (DIALYVITE VITAMIN D 5000) 125 MCG (5000 UT) capsule Take 20,000 Units by mouth daily.   Coenzyme Q10 (COQ-10) 100 MG CAPS Take 100 mg by mouth daily.   Flaxseed, Linseed, (FLAX SEED OIL) 1000 MG CAPS Take 1,000 mg by mouth daily.   folic acid (FOLVITE) 283 MCG tablet Take 800 mcg by mouth daily.   lamoTRIgine (LAMICTAL) 100 MG tablet Take 100 mg by mouth daily.   Liniments (BLUE-EMU SUPER STRENGTH EX) Apply 1 Application topically daily as needed (pain).   lisinopril-hydrochlorothiazide (ZESTORETIC) 20-25 MG tablet Take  1 tablet by mouth 2 (two) times daily.   magnesium oxide (MAG-OX) 400 MG tablet Take 400 mg by mouth daily.   Multiple Vitamins-Minerals (MULTIVITAMIN WITH MINERALS) tablet Take 1 tablet by mouth daily.   naproxen sodium (ALEVE) 220 MG tablet Take 220 mg by mouth See admin instructions. Take 220 mg in the morning, may take a second 220 mg dose as needed for pain   Omega 3 1000 MG CAPS Take 1,000 mg by mouth daily.   OVER THE COUNTER MEDICATION Nano CBD OIL: Pt takes 20 drops in the morning and 20 drops in the evening.   rosuvastatin (CRESTOR) 10 MG tablet Take 1 tablet (10 mg total) by mouth daily.   sildenafil (VIAGRA) 50 MG tablet Take 50 mg by mouth daily as needed for  erectile dysfunction.   zinc gluconate 50 MG tablet Take 50 mg by mouth daily.     Allergies:   Latex   Social History   Tobacco Use   Smoking status: Never   Smokeless tobacco: Current    Types: Snuff  Vaping Use   Vaping Use: Never used  Substance Use Topics   Alcohol use: Yes    Comment: occasionally   Drug use: Never     Family Hx: The patient's family history includes Cancer in his father; Diabetes in his mother; Heart disease in his father and mother; Hyperlipidemia in his father and mother; Obesity in his mother.  ROS:   Please see the history of present illness.     All other systems reviewed and are negative.   Prior Sleep studies:   The following studies were reviewed today:  PSG  Labs/Other Tests and Data Reviewed:     Recent Labs: 10/17/2021: ALT 69; Pro B Natriuretic peptide (BNP) 169.0; TSH 1.40 03/08/2022: BUN 45; Creatinine, Ser 1.33; Hemoglobin 13.5; Platelets 195; Potassium 4.2; Sodium 139    Wt Readings from Last 3 Encounters:  04/18/22 (!) 303 lb (137.4 kg)  03/08/22 (!) 310 lb (140.6 kg)  02/14/22 (!) 314 lb 9.6 oz (142.7 kg)     Risk Assessment/Calculations:          Objective:    Vital Signs:  BP (!) 153/86   Pulse 94   Ht '5\' 11"'$  (1.803 m)   Wt (!) 303 lb (137.4 kg)   BMI 42.26 kg/m    VITAL SIGNS:  reviewed GEN:  no acute distress EYES:  sclerae anicteric, EOMI - Extraocular Movements Intact RESPIRATORY:  normal respiratory effort, symmetric expansion CARDIOVASCULAR:  no peripheral edema SKIN:  no rash, lesions or ulcers. MUSCULOSKELETAL:  no obvious deformities. NEURO:  alert and oriented x 3, no obvious focal deficit PSYCH:  normal affect  ASSESSMENT & PLAN:    OSA  -He was found to have mild obstructive sleep apnea with an AHI of 8.2/h overall and O2 saturations as low as 86%.  AHI during REM sleep was 12.4/h.  AHI while supine was 131/h. -his OSA mainly occurs with supine sleep and he says that he normally sleeps on  his side and not his back -he has NO excessive daytime sleepiness and has excellent sleep -At this time I do not think we need to pursue PAP therapy.  He knows to avoid sleeping supine to avoid apneas -I told him if he starts to have any problems with excessive sleepiness to let me know  Hypertension -BP is borderline controlled on exam today -Continue prescription drug management with lisinopril HCT 20-25 mg twice daily with as  needed refills  Time:   Today, I have spent 20 minutes with the patient with telehealth technology discussing the above problems.     Medication Adjustments/Labs and Tests Ordered: Current medicines are reviewed at length with the patient today.  Concerns regarding medicines are outlined above.   Tests Ordered: No orders of the defined types were placed in this encounter.   Medication Changes: No orders of the defined types were placed in this encounter.   Follow Up: As needed  Signed, Fransico Him, MD  04/18/2022 2:28 PM    [facility]

## 2022-04-22 NOTE — Telephone Encounter (Signed)
Na

## 2022-05-02 ENCOUNTER — Ambulatory Visit (INDEPENDENT_AMBULATORY_CARE_PROVIDER_SITE_OTHER): Payer: Medicare Other | Admitting: Family Medicine

## 2022-05-02 ENCOUNTER — Encounter: Payer: Self-pay | Admitting: Family Medicine

## 2022-05-02 VITALS — BP 126/70 | HR 88 | Temp 97.8°F | Ht 71.0 in | Wt 308.6 lb

## 2022-05-02 DIAGNOSIS — I10 Essential (primary) hypertension: Secondary | ICD-10-CM

## 2022-05-02 DIAGNOSIS — N433 Hydrocele, unspecified: Secondary | ICD-10-CM | POA: Diagnosis not present

## 2022-05-02 DIAGNOSIS — Z1211 Encounter for screening for malignant neoplasm of colon: Secondary | ICD-10-CM

## 2022-05-02 DIAGNOSIS — Z6841 Body Mass Index (BMI) 40.0 and over, adult: Secondary | ICD-10-CM

## 2022-05-02 MED ORDER — AMLODIPINE BESYLATE 2.5 MG PO TABS: 2.5000 mg | ORAL_TABLET | Freq: Every day | ORAL | 1 refills | Status: DC

## 2022-05-02 NOTE — Progress Notes (Signed)
[facility] [address] [address] Dept: [phone] Dept Fax: [phone]  Chronic Care Office Visit  Subjective:    Patient ID: Bryan Brock, male    DOB: November 26, 1955, 66 y.o..   MRN: [id number]  Chief Complaint  Patient presents with   Follow-up    3 month f/u.   Not fasting today.      History of Present Illness:  Patient is in today for reassessment of chronic medical issues.  Mr. Carreiro has had bilateral hydroceles. He underwent hydrocelectomy in late July. He notes that he is recovering well.   Mr. Ihde has a history of hyperlipidemia. He is managed on rosuvastatin 10 mg daily.   Mr. Escoe has a history hypertension. He is managed on lisinopril-HCTZ 20-25 mg daily. His cardiologist had recently suggested he might add amlodipine 2.5 mg daily to his regimen. he has been tracking his blood pressures at home. Although his pressure today is quite good, he has had Bps at home ranging 138-147/75-88.   Past Medical History: Patient Active Problem List   Diagnosis Date Noted   OSA (obstructive sleep apnea) 12/19/2021   Essential hypertension 10/29/2021   Elevated transaminase level 10/18/2021   Prediabetes 10/17/2021   Morbid obesity with BMI of 40.0-44.9, adult ([place]) 10/17/2021   Hydrocele, bilateral 08/28/2021   Bipolar 1 disorder, depressed ([place]) 05/11/2020   History of basal cell carcinoma 11/05/2007   Borderline hyperlipidemia 11/05/2007   Premature ventricular contractions 11/05/2007   History of mitral valve prolapse 11/05/2007   Past Surgical History:  Procedure Laterality Date   HYDROCELE EXCISION Bilateral 03/22/2022   Procedure: HYDROCELECTOMY ADULT;  Surgeon: Robley Fries, MD;  Location: [facility];  Service: Urology;  Laterality: Bilateral;  2 HRS   MOHS SURGERY  1990   STRABISMUS SURGERY Left    TONSILLECTOMY AND ADENOIDECTOMY     WISDOM TOOTH EXTRACTION     Family History  Problem  Relation Age of Onset   Heart disease Mother    Diabetes Mother    Obesity Mother    Hyperlipidemia Mother    Cancer Father        skin cancer   Heart disease Father    Hyperlipidemia Father    Outpatient Medications Prior to Visit  Medication Sig Dispense Refill   APPLE CIDER VINEGAR PO Take 15 mLs by mouth every Monday, Wednesday, and Friday.     Ascorbic Acid (VITAMIN C) 1000 MG tablet Take 3,000 mg by mouth daily.     calcium elemental as carbonate (TUMS ULTRA 1000) 400 MG chewable tablet Chew 2,000 mg by mouth 3 (three) times daily as needed for heartburn.     Cholecalciferol (DIALYVITE VITAMIN D 5000) 125 MCG (5000 UT) capsule Take 20,000 Units by mouth daily.     Coenzyme Q10 (COQ-10) 100 MG CAPS Take 100 mg by mouth daily.     Flaxseed, Linseed, (FLAX SEED OIL) 1000 MG CAPS Take 1,000 mg by mouth daily.     folic acid (FOLVITE) 962 MCG tablet Take 800 mcg by mouth daily.     lamoTRIgine (LAMICTAL) 100 MG tablet Take 100 mg by mouth daily.     Liniments (BLUE-EMU SUPER STRENGTH EX) Apply 1 Application topically daily as needed (pain).     lisinopril-hydrochlorothiazide (ZESTORETIC) 20-25 MG tablet Take 1 tablet by mouth 2 (two) times daily.     magnesium oxide (MAG-OX) 400 MG tablet Take 400 mg by mouth daily.  Multiple Vitamins-Minerals (MULTIVITAMIN WITH MINERALS) tablet Take 1 tablet by mouth daily.     naproxen sodium (ALEVE) 220 MG tablet Take 220 mg by mouth See admin instructions. Take 220 mg in the morning, may take a second 220 mg dose as needed for pain     Omega 3 1000 MG CAPS Take 1,000 mg by mouth daily.     OVER THE COUNTER MEDICATION Nano CBD OIL: Pt takes 20 drops in the morning and 20 drops in the evening.     rosuvastatin (CRESTOR) 10 MG tablet Take 1 tablet (10 mg total) by mouth daily. 90 tablet 2   sildenafil (VIAGRA) 50 MG tablet Take 50 mg by mouth daily as needed for erectile dysfunction.     zinc gluconate 50 MG tablet Take 50 mg by mouth daily.      traMADol (ULTRAM) 50 MG tablet Take 1 tablet (50 mg total) by mouth every 6 (six) hours as needed. (Patient not taking: Reported on 04/18/2022) 20 tablet 0   No facility-administered medications prior to visit.   Allergies  Allergen Reactions   Latex Dermatitis   Objective:   Today's Vitals   05/02/22 0926  BP: 126/70  Pulse: 88  Temp: 97.8 F (36.6 C)  TempSrc: Temporal  SpO2: 98%  Weight: (!) 308 lb 9.6 oz (140 kg)  Height: '5\' 11"'$  (1.803 m)   Body mass index is 43.04 kg/m.   General: Well developed, well nourished. No acute distress. Psych: Alert and oriented. Normal mood and affect.  Health Maintenance Due  Topic Date Due   Hepatitis C Screening  Never done   Zoster Vaccines- Shingrix (1 of 2) Never done   COLONOSCOPY (Pts 45-60yr Insurance coverage will need to be confirmed)  Never done   Lab Results    Latest Ref Rng & Units 03/08/2022    1:30 PM 11/15/2021   11:49 AM 10/17/2021    5:05 PM  BMP  Glucose 70 - 99 mg/dL 128  117  98   BUN 8 - 23 mg/dL 45  23  23   Creatinine 0.61 - 1.24 mg/dL 1.33  0.94  1.08   BUN/Creat Ratio 10 - 24  24    Sodium 135 - 145 mmol/L 139  139  139   Potassium 3.5 - 5.1 mmol/L 4.2  4.6  4.2   Chloride 98 - 111 mmol/L 105  102  106   CO2 22 - 32 mmol/L '25  24  27   '$ Calcium 8.9 - 10.3 mg/dL 9.8  9.7  9.1    Lab Results  Component Value Date   HGBA1C 6.4 (H) 03/08/2022   Lab Results  Component Value Date   CHOL 143 02/07/2022   HDL 45 02/07/2022   LDLCALC 76 02/07/2022   TRIG 123 02/07/2022   CHOLHDL 3.2 02/07/2022     Assessment & Plan:   1. Essential hypertension As mentioned above, thought he blood pressure is good in office, review of home Bps shows some mild elevation. I will go ahead and add the low dose of amlodipine.  - amLODipine (NORVASC) 2.5 MG tablet; Take 1 tablet (2.5 mg total) by mouth daily.  Dispense: 90 tablet; Refill: 1  2. Morbid obesity with BMI of 40.0-44.9, adult ([facility]) Maximum weight: 316 lbs  (01/2022) Current weight: 308 lbs Weight change since last visit: - 8 lbs.  Mr. SFitzpatrickrecognizes the need for him to loose weight. He is makign some dietary changes. I encouraged him to increase  his activity level now that he is through his surgery.  3. Hydrocele, bilateral S/p hydrocelectomy. Recovering well.  4. Screening for colon cancer  - Ambulatory referral to Gastroenterology  Return in about 3 months (around 08/01/2022) for Reassessment.   Haydee Salter, MD

## 2022-05-09 ENCOUNTER — Encounter: Payer: Self-pay | Admitting: Gastroenterology

## 2022-05-16 ENCOUNTER — Ambulatory Visit (AMBULATORY_SURGERY_CENTER): Payer: Medicare Other

## 2022-05-16 ENCOUNTER — Other Ambulatory Visit: Payer: Self-pay

## 2022-05-16 VITALS — Ht 71.0 in | Wt 312.2 lb

## 2022-05-16 DIAGNOSIS — Z1211 Encounter for screening for malignant neoplasm of colon: Secondary | ICD-10-CM

## 2022-05-16 MED ORDER — PEG 3350-KCL-NA BICARB-NACL 420 G PO SOLR: 4000.0000 mL | Freq: Once | ORAL | 0 refills | Status: AC

## 2022-05-16 NOTE — Progress Notes (Signed)
Denies allergies to eggs or soy products. Denies complication of anesthesia or sedation. Denies use of weight loss medication. Denies use of O2.   Emmi instructions given for colonoscopy.  

## 2022-05-20 ENCOUNTER — Telehealth: Payer: Self-pay | Admitting: Family Medicine

## 2022-05-20 NOTE — Telephone Encounter (Signed)
Patient notified VIA phone that no pre-op was needed due to your not being put under with anesthesia. Dm/cma

## 2022-05-20 NOTE — Telephone Encounter (Signed)
Pt would like a call back today if possible to discuss his referral. [phone]

## 2022-06-01 ENCOUNTER — Encounter: Payer: Self-pay | Admitting: Certified Registered Nurse Anesthetist

## 2022-06-03 ENCOUNTER — Encounter: Payer: Self-pay | Admitting: Gastroenterology

## 2022-06-03 ENCOUNTER — Ambulatory Visit (AMBULATORY_SURGERY_CENTER): Payer: Medicare Other | Admitting: Gastroenterology

## 2022-06-03 VITALS — BP 111/75 | HR 77 | Temp 98.7°F | Resp 14 | Ht 71.0 in | Wt 312.2 lb

## 2022-06-03 DIAGNOSIS — D125 Benign neoplasm of sigmoid colon: Secondary | ICD-10-CM

## 2022-06-03 DIAGNOSIS — D123 Benign neoplasm of transverse colon: Secondary | ICD-10-CM | POA: Diagnosis not present

## 2022-06-03 DIAGNOSIS — K5730 Diverticulosis of large intestine without perforation or abscess without bleeding: Secondary | ICD-10-CM

## 2022-06-03 DIAGNOSIS — Z1211 Encounter for screening for malignant neoplasm of colon: Secondary | ICD-10-CM

## 2022-06-03 DIAGNOSIS — D124 Benign neoplasm of descending colon: Secondary | ICD-10-CM

## 2022-06-03 DIAGNOSIS — D122 Benign neoplasm of ascending colon: Secondary | ICD-10-CM | POA: Diagnosis not present

## 2022-06-03 MED ORDER — SODIUM CHLORIDE 0.9 % IV SOLN: 500.0000 mL | Freq: Once | INTRAVENOUS | Status: DC

## 2022-06-03 NOTE — Patient Instructions (Signed)
Please read handouts provided. Continue present medications. Await pathology results. Return to GI clinic as needed.   YOU HAD AN ENDOSCOPIC PROCEDURE TODAY AT THE [facility]:   Refer to the procedure report that was given to you for any specific questions about what was found during the examination.  If the procedure report does not answer your questions, please call your gastroenterologist to clarify.  If you requested that your care partner not be given the details of your procedure findings, then the procedure report has been included in a sealed envelope for you to review at your convenience later.  YOU SHOULD EXPECT: Some feelings of bloating in the abdomen. Passage of more gas than usual.  Walking can help get rid of the air that was put into your GI tract during the procedure and reduce the bloating. If you had a lower endoscopy (such as a colonoscopy or flexible sigmoidoscopy) you may notice spotting of blood in your stool or on the toilet paper. If you underwent a bowel prep for your procedure, you may not have a normal bowel movement for a few days.  Please Note:  You might notice some irritation and congestion in your nose or some drainage.  This is from the oxygen used during your procedure.  There is no need for concern and it should clear up in a day or so.  SYMPTOMS TO REPORT IMMEDIATELY:  Following lower endoscopy (colonoscopy or flexible sigmoidoscopy):  Excessive amounts of blood in the stool  Significant tenderness or worsening of abdominal pains  Swelling of the abdomen that is new, acute  Fever of 100F or higher  For urgent or emergent issues, a gastroenterologist can be reached at any hour by calling [phone]. Do not use MyChart messaging for urgent concerns.    DIET:  We do recommend a small meal at first, but then you may proceed to your regular diet.  Drink plenty of fluids but you should avoid alcoholic beverages for 24 hours.  ACTIVITY:  You  should plan to take it easy for the rest of today and you should NOT DRIVE or use heavy machinery until tomorrow (because of the sedation medicines used during the test).    FOLLOW UP: Our staff will call the number listed on your records the next business day following your procedure.  We will call around 7:15- 8:00 am to check on you and address any questions or concerns that you may have regarding the information given to you following your procedure. If we do not reach you, we will leave a message.     If any biopsies were taken you will be contacted by phone or by letter within the next 1-3 weeks.  Please call us at [phone] if you have not heard about the biopsies in 3 weeks.    SIGNATURES/CONFIDENTIALITY: You and/or your care partner have signed paperwork which will be entered into your electronic medical record.  These signatures attest to the fact that that the information above on your After Visit Summary has been reviewed and is understood.  Full responsibility of the confidentiality of this discharge information lies with you and/or your care-partner. 

## 2022-06-03 NOTE — Progress Notes (Signed)
67 Pt stating can not lie on left due to heart condition, spoke with MD and pt and agree to try left side position. No changes noted in EKG, vss

## 2022-06-03 NOTE — Op Note (Signed)
[facility] Patient Name: Bryan Brock Procedure Date: 06/03/2022 3:33 PM MRN: [id number] Endoscopist: Gerrit Heck , MD Age: 66 Referring MD:  Date of Birth: 25-Nov-1955 Gender: Male Account #: [id number] Procedure:                Colonoscopy Indications:              Screening for colorectal malignant neoplasm, This                            is the patient's first colonoscopy Medicines:                Monitored Anesthesia Care Procedure:                Pre-Anesthesia Assessment:                           - Prior to the procedure, a History and Physical                            was performed, and patient medications and                            allergies were reviewed. The patient's tolerance of                            previous anesthesia was also reviewed. The risks                            and benefits of the procedure and the sedation                            options and risks were discussed with the patient.                            All questions were answered, and informed consent                            was obtained. Prior Anticoagulants: The patient has                            taken no previous anticoagulant or antiplatelet                            agents. ASA Grade Assessment: III - A patient with                            severe systemic disease. After reviewing the risks                            and benefits, the patient was deemed in                            satisfactory condition to undergo the procedure.  After obtaining informed consent, the colonoscope                            was passed under direct vision. Throughout the                            procedure, the patient's blood pressure, pulse, and                            oxygen saturations were monitored continuously. The                            CF [id number] #[id number] was introduced through the anus                            and advanced to the the  cecum, identified by                            appendiceal orifice and ileocecal valve. The                            colonoscopy was performed without difficulty. The                            patient tolerated the procedure well. The quality                            of the bowel preparation was good. The ileocecal                            valve, appendiceal orifice, and rectum were                            photographed. Scope In: 3:43:22 PM Scope Out: 4:02:48 PM Scope Withdrawal Time: 0 hours 16 minutes 4 seconds  Total Procedure Duration: 0 hours 19 minutes 26 seconds  Findings:                 The perianal and digital rectal examinations were                            normal.                           Three sessile polyps were found in the descending                            colon, transverse colon and ascending colon. The                            polyps were 3 to 6 mm in size. These polyps were                            removed with a cold snare. Resection and retrieval  were complete. Estimated blood loss was minimal.                           Two sessile polyps were found in the sigmoid colon.                            The polyps were 5 to 6 mm in size. These polyps                            were removed with a cold snare. Resection and                            retrieval were complete. Estimated blood loss was                            minimal.                           A few small-mouthed diverticula were found in the                            sigmoid colon.                           The retroflexed view of the distal rectum and anal                            verge was normal and showed no anal or rectal                            abnormalities. Complications:            No immediate complications. Estimated Blood Loss:     Estimated blood loss was minimal. Impression:               - Three 3 to 6 mm polyps in the descending colon,                             in the transverse colon and in the ascending colon,                            removed with a cold snare. Resected and retrieved.                           - Two 5 to 6 mm polyps in the sigmoid colon,                            removed with a cold snare. Resected and retrieved.                           - Diverticulosis in the sigmoid colon.                           - The distal rectum and anal  verge are normal on                            retroflexion view. Recommendation:           - Patient has a contact number available for                            emergencies. The signs and symptoms of potential                            delayed complications were discussed with the                            patient. Return to normal activities tomorrow.                            Written discharge instructions were provided to the                            patient.                           - Resume previous diet.                           - Continue present medications.                           - Await pathology results.                           - Repeat colonoscopy for surveillance based on                            pathology results.                           - Return to GI office PRN. Gerrit Heck, MD 06/03/2022 4:08:00 PM

## 2022-06-03 NOTE — Progress Notes (Signed)
Report given to PACU, vss 

## 2022-06-03 NOTE — Progress Notes (Signed)
Called to room to assist during endoscopic procedure.  Patient ID and intended procedure confirmed with present staff. Received instructions for my participation in the procedure from the performing physician.  

## 2022-06-03 NOTE — Progress Notes (Signed)
GASTROENTEROLOGY PROCEDURE H&P NOTE   Primary Care Physician: Haydee Salter, MD    Reason for Procedure:  Colon Cancer screening  Plan:    Colonoscopy  Patient is appropriate for endoscopic procedure(s) in the ambulatory ([place]) setting.  The nature of the procedure, as well as the risks, benefits, and alternatives were carefully and thoroughly reviewed with the patient. Ample time for discussion and questions allowed. The patient understood, was satisfied, and agreed to proceed.     HPI: Bryan Brock is a 66 y.o. male who presents for colonoscopy for routine Colon Cancer screening.  No active GI symptoms.  No known family history of colon cancer or related malignancy.  Patient is otherwise without complaints or active issues today.  Past Medical History:  Diagnosis Date   Acne    Arthritis    Asthma    Bipolar 1 disorder ([place])    Cancer ([place])    basal cell cancer removed from back   Cataract    Depression    Dysrhythmia    PVCs   GERD (gastroesophageal reflux disease)    Heart murmur    Hepatitis    remote hx Hepatitis A   Hyperlipidemia    Hypertension    Mitral valve prolapse    Panic attacks    Pre-diabetes    Sleep apnea     Past Surgical History:  Procedure Laterality Date   HYDROCELE EXCISION Bilateral 03/22/2022   Procedure: HYDROCELECTOMY ADULT;  Surgeon: Robley Fries, MD;  Location: [facility];  Service: Urology;  Laterality: Bilateral;  2 HRS   MOHS SURGERY  1990   STRABISMUS SURGERY Left    TONSILLECTOMY AND ADENOIDECTOMY     WISDOM TOOTH EXTRACTION      Prior to Admission medications   Medication Sig Start Date End Date Taking? Authorizing Provider  amLODipine (NORVASC) 2.5 MG tablet Take 1 tablet (2.5 mg total) by mouth daily. 05/02/22  Yes Haydee Salter, MD  APPLE CIDER VINEGAR PO Take 15 mLs by mouth every Monday, Wednesday, and Friday.   Yes [provider]  Ascorbic Acid (VITAMIN C) 1000 MG tablet Take 3,000 mg by mouth daily.    Yes [provider]  calcium elemental as carbonate (TUMS ULTRA 1000) 400 MG chewable tablet Chew 2,000 mg by mouth 3 (three) times daily as needed for heartburn.   Yes [provider]  Cholecalciferol (DIALYVITE VITAMIN D 5000) 125 MCG (5000 UT) capsule Take 20,000 Units by mouth daily.   Yes [provider]  Coenzyme Q10 (COQ-10) 100 MG CAPS Take 100 mg by mouth daily.   Yes [provider]  Flaxseed, Linseed, (FLAX SEED OIL) 1000 MG CAPS Take 1,000 mg by mouth daily.   Yes [provider]  folic acid (FOLVITE) 578 MCG tablet Take 800 mcg by mouth daily.   Yes [provider]  lisinopril-hydrochlorothiazide (ZESTORETIC) 20-25 MG tablet Take 1 tablet by mouth 2 (two) times daily.   Yes [provider]  magnesium oxide (MAG-OX) 400 MG tablet Take 400 mg by mouth daily.   Yes [provider]  Multiple Vitamins-Minerals (MULTIVITAMIN WITH MINERALS) tablet Take 1 tablet by mouth daily.   Yes [provider]  Omega 3 1000 MG CAPS Take 1,000 mg by mouth daily.   Yes [provider]  OVER THE COUNTER MEDICATION Nano CBD OIL: Pt takes 20 drops in the morning and 20 drops in the evening.   Yes [provider]  rosuvastatin (CRESTOR)  10 MG tablet Take 1 tablet (10 mg total) by mouth daily. 11/08/21  Yes Freada Bergeron, MD  sildenafil (VIAGRA) 50 MG tablet Take 50 mg by mouth daily as needed for erectile dysfunction.   Yes [provider]  zinc gluconate 50 MG tablet Take 50 mg by mouth daily.   Yes [provider]  lamoTRIgine (LAMICTAL) 100 MG tablet Take 100 mg by mouth daily. Patient not taking: Reported on 06/03/2022 04/25/20   [provider]  Liniments (BLUE-EMU SUPER STRENGTH EX) Apply 1 Application topically daily as needed (pain).    [provider]  naproxen sodium (ALEVE) 220 MG tablet Take 220 mg by mouth See admin instructions. Take 220 mg in the morning,  may take a second 220 mg dose as needed for pain    [provider]    Current Outpatient Medications  Medication Sig Dispense Refill   amLODipine (NORVASC) 2.5 MG tablet Take 1 tablet (2.5 mg total) by mouth daily. 90 tablet 1   APPLE CIDER VINEGAR PO Take 15 mLs by mouth every Monday, Wednesday, and Friday.     Ascorbic Acid (VITAMIN C) 1000 MG tablet Take 3,000 mg by mouth daily.     calcium elemental as carbonate (TUMS ULTRA 1000) 400 MG chewable tablet Chew 2,000 mg by mouth 3 (three) times daily as needed for heartburn.     Cholecalciferol (DIALYVITE VITAMIN D 5000) 125 MCG (5000 UT) capsule Take 20,000 Units by mouth daily.     Coenzyme Q10 (COQ-10) 100 MG CAPS Take 100 mg by mouth daily.     Flaxseed, Linseed, (FLAX SEED OIL) 1000 MG CAPS Take 1,000 mg by mouth daily.     folic acid (FOLVITE) 762 MCG tablet Take 800 mcg by mouth daily.     lisinopril-hydrochlorothiazide (ZESTORETIC) 20-25 MG tablet Take 1 tablet by mouth 2 (two) times daily.     magnesium oxide (MAG-OX) 400 MG tablet Take 400 mg by mouth daily.     Multiple Vitamins-Minerals (MULTIVITAMIN WITH MINERALS) tablet Take 1 tablet by mouth daily.     Omega 3 1000 MG CAPS Take 1,000 mg by mouth daily.     OVER THE COUNTER MEDICATION Nano CBD OIL: Pt takes 20 drops in the morning and 20 drops in the evening.     rosuvastatin (CRESTOR) 10 MG tablet Take 1 tablet (10 mg total) by mouth daily. 90 tablet 2   sildenafil (VIAGRA) 50 MG tablet Take 50 mg by mouth daily as needed for erectile dysfunction.     zinc gluconate 50 MG tablet Take 50 mg by mouth daily.     lamoTRIgine (LAMICTAL) 100 MG tablet Take 100 mg by mouth daily. (Patient not taking: Reported on 06/03/2022)     Liniments (BLUE-EMU SUPER STRENGTH EX) Apply 1 Application topically daily as needed (pain).     naproxen sodium (ALEVE) 220 MG tablet Take 220 mg by mouth See admin instructions. Take 220 mg in the morning, may take a second 220 mg dose as needed for  pain     Current Facility-Administered Medications  Medication Dose Route Frequency Provider Last Rate Last Admin   0.9 %  sodium chloride infusion  500 mL Intravenous Once Torrell Krutz V, DO        Allergies as of 06/03/2022 - Review Complete 06/03/2022  Allergen Reaction Noted   Latex Dermatitis 03/22/2022    Family History  Problem Relation Age of Onset   Heart disease Mother    Diabetes Mother  Obesity Mother    Hyperlipidemia Mother    Cancer Father        skin cancer   Heart disease Father    Hyperlipidemia Father    Colon cancer Neg Hx    Esophageal cancer Neg Hx    Rectal cancer Neg Hx    Stomach cancer Neg Hx     Social History   Socioeconomic History   Marital status: Soil scientist    Spouse name: Not on file   Number of children: 2   Years of education: Not on file   Highest education level: Not on file  Occupational History   Occupation: Administrator, [employer] Service    Comment: Part-Time  Tobacco Use   Smoking status: Never   Smokeless tobacco: Current    Types: Snuff  Vaping Use   Vaping Use: Never used  Substance and Sexual Activity   Alcohol use: Yes    Comment: occasionally   Drug use: Never   Sexual activity: Yes  Other Topics Concern   Not on file  Social History Narrative   Not on file   Social Determinants of Health   Financial Resource Strain: Low Risk  (03/05/2022)   Overall Financial Resource Strain (CARDIA)    Difficulty of Paying Living Expenses: Not hard at all  Food Insecurity: No Food Insecurity (03/05/2022)   Hunger Vital Sign    Worried About Running Out of Food in the Last Year: Never true    Ran Out of Food in the Last Year: Never true  Transportation Needs: No Transportation Needs (03/05/2022)   PRAPARE - Hydrologist (Medical): No    Lack of Transportation (Non-Medical): No  Physical Activity: Inactive (03/05/2022)   Exercise Vital Sign    Days of Exercise per Week: 0 days    Minutes of  Exercise per Session: 0 min  Stress: No Stress Concern Present (03/05/2022)   [place]    Feeling of Stress : Not at all  Social Connections: Moderately Integrated (03/05/2022)   Social Connection and Isolation Panel [NHANES]    Frequency of Communication with Friends and Family: Three times a week    Frequency of Social Gatherings with Friends and Family: Three times a week    Attends Religious Services: More than 4 times per year    Active Member of Clubs or Organizations: No    Attends Archivist Meetings: Never    Marital Status: Living with partner  Intimate Partner Violence: Not At Risk (03/05/2022)   Humiliation, Afraid, Rape, and Kick questionnaire    Fear of Current or Ex-Partner: No    Emotionally Abused: No    Physically Abused: No    Sexually Abused: No    Physical Exam: Vital signs in last 24 hours: '@BP'$  130/75   Pulse 90   Temp 98.7 F (37.1 C)   Ht '5\' 11"'$  (1.803 m)   Wt (!) 312 lb 3.2 oz (141.6 kg)   SpO2 98%   BMI 43.54 kg/m  GEN: NAD EYE: Sclerae anicteric ENT: MMM CV: Non-tachycardic Pulm: CTA b/l GI: Soft, NT/ND NEURO:  Alert & Oriented x 3   Gerrit Heck, DO [facility]   06/03/2022 3:32 PM

## 2022-06-04 ENCOUNTER — Telehealth: Payer: Self-pay

## 2022-06-04 NOTE — Telephone Encounter (Signed)
  Follow up Call-     06/03/2022    2:56 PM  Call back number  Post procedure Call Back phone  # [phone]  Permission to leave phone message Yes     Patient questions:  Do you have a fever, pain , or abdominal swelling? No. Pain Score  0 *  Have you tolerated food without any problems? Yes.    Have you been able to return to your normal activities? Yes.    Do you have any questions about your discharge instructions: Diet   No. Medications  No. Follow up visit  No.  Do you have questions or concerns about your Care? No.  Actions: * If pain score is 4 or above: No action needed, pain <4.

## 2022-06-11 ENCOUNTER — Encounter: Payer: Self-pay | Admitting: Gastroenterology

## 2022-06-13 ENCOUNTER — Encounter: Payer: Self-pay | Admitting: Gastroenterology

## 2022-06-13 NOTE — Telephone Encounter (Signed)
Based on current GI societal guidelines, with 5 subcentimeter tubular adenomas, perfectly reasonable to proceed with a 3-year surveillance interval.  Please change his recall to 3 years (2026).  Subsequent colonoscopies are typically classified under the indication of surveillance, and part of an ongoing preventative strategy.  With that said, insurers can have some variability in how they cover surveillance vs screening.  But these would not be classified as diagnostic (which is a good thing).  Can you please review those recommendations with the patient with regards to his own insurance.  Thank you.  Happy to hear he had such a positive experience.  Please extend our gratitude for his kind remarks on behalf of our entire endoscopy staff.

## 2022-07-01 ENCOUNTER — Telehealth: Payer: Self-pay | Admitting: Family Medicine

## 2022-07-01 NOTE — Telephone Encounter (Signed)
Can you suggest a eye care the patient haven't had a eye exam within over 20 years and pt rather it be within [place]

## 2022-07-01 NOTE — Telephone Encounter (Signed)
Please review and advise. Thanks. Dm/cma  

## 2022-07-02 NOTE — Telephone Encounter (Signed)
Patient notified [place] phone.  No other questions. Dm/cma

## 2022-07-24 ENCOUNTER — Other Ambulatory Visit: Payer: Self-pay

## 2022-07-24 DIAGNOSIS — Z01818 Encounter for other preprocedural examination: Secondary | ICD-10-CM

## 2022-07-24 DIAGNOSIS — Z8679 Personal history of other diseases of the circulatory system: Secondary | ICD-10-CM

## 2022-07-24 DIAGNOSIS — G4733 Obstructive sleep apnea (adult) (pediatric): Secondary | ICD-10-CM

## 2022-07-24 DIAGNOSIS — R0602 Shortness of breath: Secondary | ICD-10-CM

## 2022-07-24 MED ORDER — ROSUVASTATIN CALCIUM 10 MG PO TABS: 10.0000 mg | ORAL_TABLET | Freq: Every day | ORAL | 1 refills | Status: DC

## 2022-07-26 NOTE — Progress Notes (Unsigned)
Cardiology Office Note:    Date:  07/26/2022   ID:  Bryan Brock, DOB Aug 17, 1956, MRN [id number]  PCP:  Haydee Salter, MD   [facility] Providers Cardiologist:  None {   Referring MD: Haydee Salter, MD    History of Present Illness:    Bryan Brock is a 66 y.o. male with a hx of MVP, symptomatic PVCs, HTN, HLD and morbid obesity who presents to clinic for follow-up.  He saw his PCP 10/17/2021 for preoperative clearance for a bilateral hydrocele repair at the request of Dr. Arnette Schaumann. At that visit his blood pressure was 180/100.  His EKG showed possible LAE and RVH. Due to significant hypertension, pedal edema, and abnormal EKG, he was referred to cardiology to assess for heart failure prior to his surgery.  He followed up with his PCP 10/29/2021 and his blood pressure was 162/94. He was started on 10-12.5 mg lisinopril-HCTZ once daily.  Was seen on 11/08/21 for pre-op evaluation. Myoview 11/2021 showed no evidence of ischemia or infarction. TTE 11/2021 with LVEF 65-70%, mild LVH, G1DD, normal RV, trivial MR with no evidence of prolapse. Sleep study consistent with mild OSA.  Was last seen in follow-up by Christen Bame 01/2022 where he was stable from a CV standpoint.  Today, ***  Past Medical History:  Diagnosis Date   Acne    Arthritis    Asthma    Bipolar 1 disorder ([place])    Cancer ([place])    basal cell cancer removed from back   Cataract    Depression    Dysrhythmia    PVCs   GERD (gastroesophageal reflux disease)    Heart murmur    Hepatitis    remote hx Hepatitis A   Hyperlipidemia    Hypertension    Mitral valve prolapse    Panic attacks    PFO (patent foramen ovale)    ? small PFO per echo   Pre-diabetes    Sleep apnea     Past Surgical History:  Procedure Laterality Date   HYDROCELE EXCISION Bilateral 03/22/2022   Procedure: HYDROCELECTOMY ADULT;  Surgeon: Robley Fries, MD;  Location: [facility];  Service: Urology;  Laterality: Bilateral;   2 HRS   MOHS SURGERY  1990   STRABISMUS SURGERY Left    TONSILLECTOMY AND ADENOIDECTOMY     WISDOM TOOTH EXTRACTION      Current Medications: No outpatient medications have been marked as taking for the 07/29/22 encounter (Appointment) with Freada Bergeron, MD.     Allergies:   Latex   Social History   Socioeconomic History   Marital status: Soil scientist    Spouse name: Not on file   Number of children: 2   Years of education: Not on file   Highest education level: Not on file  Occupational History   Occupation: Administrator, [employer] Service    Comment: Part-Time  Tobacco Use   Smoking status: Never   Smokeless tobacco: Current    Types: Snuff  Vaping Use   Vaping Use: Never used  Substance and Sexual Activity   Alcohol use: Yes    Comment: occasionally   Drug use: Never   Sexual activity: Yes  Other Topics Concern   Not on file  Social History Narrative   Not on file   Social Determinants of Health   Financial Resource Strain: Low Risk  (03/05/2022)   Overall Financial Resource Strain (CARDIA)    Difficulty of Paying Living Expenses: Not hard  at all  Food Insecurity: No Food Insecurity (03/05/2022)   Hunger Vital Sign    Worried About Running Out of Food in the Last Year: Never true    Ran Out of Food in the Last Year: Never true  Transportation Needs: No Transportation Needs (03/05/2022)   PRAPARE - Hydrologist (Medical): No    Lack of Transportation (Non-Medical): No  Physical Activity: Inactive (03/05/2022)   Exercise Vital Sign    Days of Exercise per Week: 0 days    Minutes of Exercise per Session: 0 min  Stress: No Stress Concern Present (03/05/2022)   [place]    Feeling of Stress : Not at all  Social Connections: Moderately Integrated (03/05/2022)   Social Connection and Isolation Panel [NHANES]    Frequency of Communication with Friends and Family: Three times  a week    Frequency of Social Gatherings with Friends and Family: Three times a week    Attends Religious Services: More than 4 times per year    Active Member of Clubs or Organizations: No    Attends Archivist Meetings: Never    Marital Status: Living with partner     Family History: The patient's family history includes Cancer in his father; Diabetes in his mother; Heart disease in his father and mother; Hyperlipidemia in his father and mother; Obesity in his mother. There is no history of Colon cancer, Esophageal cancer, Rectal cancer, or Stomach cancer.  ROS:   Review of Systems  Constitutional:  Negative for chills and fever.  HENT:  Negative for ear discharge and ear pain.   Eyes:  Negative for double vision and discharge.  Respiratory:  Positive for shortness of breath. Negative for cough and hemoptysis.   Cardiovascular:  Positive for palpitations. Negative for chest pain, orthopnea, claudication, leg swelling and PND.  Gastrointestinal:  Negative for diarrhea and vomiting.  Genitourinary:  Negative for dysuria.  Musculoskeletal:  Positive for joint pain.  Neurological:  Negative for tingling and headaches.  Endo/Heme/Allergies:  Negative for polydipsia.  Psychiatric/Behavioral:  Negative for hallucinations and suicidal ideas.      EKGs/Labs/Other Studies Reviewed:    The following studies were reviewed today:  Myoview 11/2021:   Findings are consistent with no prior ischemia and no prior myocardial infarction. The study is intermediate risk.   No ST deviation was noted.   Left ventricular function is abnormal. Nuclear stress EF: 48 %. The left ventricular ejection fraction is mildly decreased (45-54%). End diastolic cavity size is normal. End systolic cavity size is normal.   Prior study not available for comparison.   Findings: Negative for stress induced arrhythmias. No evidence of ischemia or infarction. LV function is mildly reduced.    Conclusions: Stress test is negative. Intermediate risk due to decrease LV function, thought function look better on concomitant echo.  TTE 11/2021: IMPRESSIONS     1. Left ventricular ejection fraction, by estimation, is 65 to 70%. The  left ventricle has normal function. The left ventricle has no regional  wall motion abnormalities. There is mild left ventricular hypertrophy.  Left ventricular diastolic parameters  are consistent with Grade I diastolic dysfunction (impaired relaxation).  The average left ventricular global longitudinal strain is -19.8 %. The  global longitudinal strain is normal.   2. Right ventricular systolic function is normal. The right ventricular  size is normal.   3. The mitral valve leaflets are elongated - no  obvious prolapse. The  mitral valve is abnormal. Trivial mitral valve regurgitation.   4. The aortic valve is tricuspid. Aortic valve regurgitation is not  visualized.   5. The inferior vena cava is normal in size with greater than 50%  respiratory variability, suggesting right atrial pressure of 3 mmHg.   6. Cannot exclude a small PFO.   Comparison(s): No prior Echocardiogram.    EKG:  EKG is personally reviewed. 11/08/2021: Sinus rhythm. Rate 98 bpm.   Recent Labs: 10/17/2021: ALT 69; Pro B Natriuretic peptide (BNP) 169.0; TSH 1.40 03/08/2022: BUN 45; Creatinine, Ser 1.33; Hemoglobin 13.5; Platelets 195; Potassium 4.2; Sodium 139   Recent Lipid Panel    Component Value Date/Time   CHOL 143 02/07/2022 0932   TRIG 123 02/07/2022 0932   HDL 45 02/07/2022 0932   CHOLHDL 3.2 02/07/2022 0932   CHOLHDL 4 10/17/2021 1705   VLDL 22.2 10/17/2021 1705   LDLCALC 76 02/07/2022 0932     Risk Assessment/Calculations:           Physical Exam:    VS:  There were no vitals taken for this visit.    Wt Readings from Last 3 Encounters:  06/03/22 (!) 312 lb 3.2 oz (141.6 kg)  05/16/22 (!) 312 lb 3.2 oz (141.6 kg)  05/02/22 (!) 308 lb 9.6 oz  (140 kg)     GEN: Well nourished, well developed in no acute distress HEENT: Normal NECK: No JVD; No carotid bruits CARDIAC: RRR, 1/6  murmur, No rubs, no gallops RESPIRATORY:  Clear to auscultation without rales, wheezing or rhonchi  ABDOMEN: Soft, non-tender, non-distended MUSCULOSKELETAL:  No edema; No deformity  SKIN: Warm and dry NEUROLOGIC:  Alert and oriented x 3 PSYCHIATRIC:  Normal affect   ASSESSMENT:    No diagnosis found.   PLAN:    In order of problems listed above:  #DOE: Reassuring CV work-up. Myoview 11/2021 normal. TTE with LVEF 65-70%, G1DD, no valve disease.  -Continue weight loss efforts  #Palpitations: Occurs intermittently every 2-3 months lasting several hours at a time. No associated SOB, lightheadedness or chest pain. Discussed that due to low frequency, cardiac monitor is not likely the best option. Recommended Kardia device.   #HTN: Elevated today to 160/90s. Runs similarly at home. -Continue amlodipine 2.'5mg'$  daily Continue lisinopril-HCTZ 20-'25mg'$  daily -BMET today  #HLD: *** -Continue crestor '10mg'$  daily -Repeat lipid panel at next visit in 3 months  #Morbid Obesity: BMI 41. -Lifestyle modifications -Starting weight loss program  #OSA: -Follows with Dr. Radford Pax -Continue CPAP     Shared Decision Making/Informed Consent The risks [chest pain, shortness of breath, cardiac arrhythmias, dizziness, blood pressure fluctuations, myocardial infarction, stroke/transient ischemic attack, nausea, vomiting, allergic reaction, radiation exposure, metallic taste sensation and life-threatening complications (estimated to be 1 in 10,000)], benefits (risk stratification, diagnosing coronary artery disease, treatment guidance) and alternatives of a nuclear stress test were discussed in detail with Bryan Brock and he agrees to proceed.  Follow-up:  3 months.  Medication Adjustments/Labs and Tests Ordered: Current medicines are reviewed at length with the  patient today.  Concerns regarding medicines are outlined above.   No orders of the defined types were placed in this encounter.  No orders of the defined types were placed in this encounter.  There are no Patient Instructions on file for this visit.    I,Mathew Stumpf,acting as a Education administrator for Freada Bergeron, MD.,have documented all relevant documentation on the behalf of Freada Bergeron, MD,as directed by  Greer Ee  Johney Frame, MD while in the presence of Freada Bergeron, MD.  I, Freada Bergeron, MD, have reviewed all documentation for this visit. The documentation on 07/26/22 for the exam, diagnosis, procedures, and orders are all accurate and complete.   Signed, Freada Bergeron, MD  07/26/2022 9:59 AM    [facility]

## 2022-07-29 ENCOUNTER — Encounter: Payer: Self-pay | Admitting: Cardiology

## 2022-07-29 ENCOUNTER — Ambulatory Visit: Payer: Medicare Other | Attending: Cardiology | Admitting: Cardiology

## 2022-07-29 VITALS — BP 124/72 | HR 83 | Ht 71.0 in | Wt 324.4 lb

## 2022-07-29 DIAGNOSIS — R0609 Other forms of dyspnea: Secondary | ICD-10-CM

## 2022-07-29 DIAGNOSIS — Z79899 Other long term (current) drug therapy: Secondary | ICD-10-CM

## 2022-07-29 DIAGNOSIS — I10 Essential (primary) hypertension: Secondary | ICD-10-CM | POA: Diagnosis not present

## 2022-07-29 DIAGNOSIS — Z6841 Body Mass Index (BMI) 40.0 and over, adult: Secondary | ICD-10-CM | POA: Diagnosis present

## 2022-07-29 DIAGNOSIS — E782 Mixed hyperlipidemia: Secondary | ICD-10-CM

## 2022-07-29 DIAGNOSIS — Z8679 Personal history of other diseases of the circulatory system: Secondary | ICD-10-CM | POA: Diagnosis present

## 2022-07-29 DIAGNOSIS — R002 Palpitations: Secondary | ICD-10-CM

## 2022-07-29 DIAGNOSIS — R0602 Shortness of breath: Secondary | ICD-10-CM

## 2022-07-29 DIAGNOSIS — Z01818 Encounter for other preprocedural examination: Secondary | ICD-10-CM

## 2022-07-29 DIAGNOSIS — G4733 Obstructive sleep apnea (adult) (pediatric): Secondary | ICD-10-CM | POA: Diagnosis present

## 2022-07-29 MED ORDER — HYDROCHLOROTHIAZIDE 25 MG PO TABS: 25.0000 mg | ORAL_TABLET | Freq: Every day | ORAL | 2 refills | Status: DC

## 2022-07-29 MED ORDER — ROSUVASTATIN CALCIUM 10 MG PO TABS: 10.0000 mg | ORAL_TABLET | Freq: Every day | ORAL | 3 refills | Status: DC

## 2022-07-29 MED ORDER — AMLODIPINE BESYLATE 2.5 MG PO TABS: 2.5000 mg | ORAL_TABLET | Freq: Every day | ORAL | 1 refills | Status: DC

## 2022-07-29 MED ORDER — AMLODIPINE BESYLATE 5 MG PO TABS: 5.0000 mg | ORAL_TABLET | Freq: Every day | ORAL | 2 refills | Status: DC

## 2022-07-29 MED ORDER — LISINOPRIL 40 MG PO TABS: 40.0000 mg | ORAL_TABLET | Freq: Every day | ORAL | 2 refills | Status: DC

## 2022-07-29 NOTE — Progress Notes (Signed)
Cardiology Office Note:    Date:  07/29/2022   ID:  Bryan Brock, DOB October 04, 1955, MRN [id number]  PCP:  Haydee Salter, MD   [facility] Providers Cardiologist:  None {   Referring MD: Haydee Salter, MD    History of Present Illness:    Bryan Brock is a 66 y.o. male with a hx of MVP, symptomatic PVCs, HTN, HLD and morbid obesity who presents to clinic for follow-up.  He saw his PCP 10/17/2021 for preoperative clearance for a bilateral hydrocele repair at the request of Dr. Arnette Schaumann. At that visit his blood pressure was 180/100.  His EKG showed possible LAE and RVH. Due to significant hypertension, pedal edema, and abnormal EKG, he was referred to cardiology to assess for heart failure prior to his surgery.  He followed up with his PCP 10/29/2021 and his blood pressure was 162/94. He was started on 10-12.5 mg lisinopril-HCTZ once daily.  Was seen on 11/08/21 for pre-op evaluation. Myoview 11/2021 showed no evidence of ischemia or infarction. TTE 11/2021 with LVEF 65-70%, mild LVH, G1DD, normal RV, trivial MR with no evidence of prolapse. Sleep study consistent with mild OSA.  Was last seen in follow-up by Christen Bame 01/2022 where he was stable from a CV standpoint.  Today, the patient states that well overall. However, he has had a significant cold with cough and chest congestion over the last few weeks. He notes he is slowly recovering but it has taken longer than usual. Declined CXR today but will let us know if symptoms persist.  Otherwise, he is doing well from a CV standpoint. No chest pain, significant dyspnea, lightheadedness, dizziness or syncope. SBP has been running mainly 130-150s at home on current regimen. He is wondering if amlodipine should be increased.   Has been told he does not need CPAP for now.  He gained 12-16 pounds since last visit and would like to lose weight. His healthy diet includes Low carb, high protein, including fish and meat. He loves  vegetables.  He denies any shortness of breath or peripheral edema. No lightheadedness, headaches, syncope, orthopnea, or PND.   Past Medical History:  Diagnosis Date   Acne    Arthritis    Asthma    Bipolar 1 disorder ([place])    Cancer ([place])    basal cell cancer removed from back   Cataract    Depression    Dysrhythmia    PVCs   GERD (gastroesophageal reflux disease)    Heart murmur    Hepatitis    remote hx Hepatitis A   Hyperlipidemia    Hypertension    Mitral valve prolapse    Panic attacks    PFO (patent foramen ovale)    ? small PFO per echo   Pre-diabetes    Sleep apnea     Past Surgical History:  Procedure Laterality Date   HYDROCELE EXCISION Bilateral 03/22/2022   Procedure: HYDROCELECTOMY ADULT;  Surgeon: Robley Fries, MD;  Location: [facility];  Service: Urology;  Laterality: Bilateral;  2 HRS   MOHS SURGERY  1990   STRABISMUS SURGERY Left    TONSILLECTOMY AND ADENOIDECTOMY     WISDOM TOOTH EXTRACTION      Current Medications: Current Meds  Medication Sig   amLODipine (NORVASC) 5 MG tablet Take 1 tablet (5 mg total) by mouth at bedtime.   APPLE CIDER VINEGAR PO Take 15 mLs by mouth every Monday, Wednesday, and Friday.   Ascorbic Acid (  VITAMIN C) 1000 MG tablet Take 3,000 mg by mouth daily.   calcium elemental as carbonate (TUMS ULTRA 1000) 400 MG chewable tablet Chew 2,000 mg by mouth 3 (three) times daily as needed for heartburn.   Cholecalciferol (DIALYVITE VITAMIN D 5000) 125 MCG (5000 UT) capsule Take 20,000 Units by mouth daily.   Coenzyme Q10 (COQ-10) 100 MG CAPS Take 100 mg by mouth daily.   Flaxseed, Linseed, (FLAX SEED OIL) 1000 MG CAPS Take 1,000 mg by mouth daily.   folic acid (FOLVITE) 578 MCG tablet Take 800 mcg by mouth daily.   hydrochlorothiazide (HYDRODIURIL) 25 MG tablet Take 1 tablet (25 mg total) by mouth daily.   lamoTRIgine (LAMICTAL) 100 MG tablet Take 100 mg by mouth daily.   Liniments (BLUE-EMU SUPER STRENGTH EX) Apply 1  Application topically daily as needed (pain).   lisinopril (ZESTRIL) 40 MG tablet Take 1 tablet (40 mg total) by mouth daily.   magnesium oxide (MAG-OX) 400 MG tablet Take 400 mg by mouth daily.   Multiple Vitamins-Minerals (MULTIVITAMIN WITH MINERALS) tablet Take 1 tablet by mouth daily.   naproxen sodium (ALEVE) 220 MG tablet Take 220 mg by mouth See admin instructions. Take 220 mg in the morning, may take a second 220 mg dose as needed for pain   Omega 3 1000 MG CAPS Take 1,000 mg by mouth daily.   OVER THE COUNTER MEDICATION Nano CBD OIL: Pt takes 20 drops in the morning and 20 drops in the evening.   sildenafil (VIAGRA) 50 MG tablet Take 50 mg by mouth daily as needed for erectile dysfunction.   zinc gluconate 50 MG tablet Take 50 mg by mouth daily.   [DISCONTINUED] amLODipine (NORVASC) 2.5 MG tablet Take 1 tablet (2.5 mg total) by mouth daily.   [DISCONTINUED] lisinopril-hydrochlorothiazide (ZESTORETIC) 20-25 MG tablet Take 1 tablet by mouth 2 (two) times daily.   [DISCONTINUED] rosuvastatin (CRESTOR) 10 MG tablet Take 1 tablet (10 mg total) by mouth daily.     Allergies:   Latex   Social History   Socioeconomic History   Marital status: Soil scientist    Spouse name: Not on file   Number of children: 2   Years of education: Not on file   Highest education level: Not on file  Occupational History   Occupation: Administrator, Civil Service    Comment: Part-Time  Tobacco Use   Smoking status: Never   Smokeless tobacco: Current    Types: Snuff  Vaping Use   Vaping Use: Never used  Substance and Sexual Activity   Alcohol use: Yes    Comment: occasionally   Drug use: Never   Sexual activity: Yes  Other Topics Concern   Not on file  Social History Narrative   Not on file   Social Determinants of Health   Financial Resource Strain: Low Risk  (03/05/2022)   Overall Financial Resource Strain (CARDIA)    Difficulty of Paying Living Expenses: Not hard at all  Food Insecurity: No Food  Insecurity (03/05/2022)   Hunger Vital Sign    Worried About Running Out of Food in the Last Year: Never true    Ran Out of Food in the Last Year: Never true  Transportation Needs: No Transportation Needs (03/05/2022)   PRAPARE - Hydrologist (Medical): No    Lack of Transportation (Non-Medical): No  Physical Activity: Inactive (03/05/2022)   Exercise Vital Sign    Days of Exercise per Week: 0 days    Minutes  of Exercise per Session: 0 min  Stress: No Stress Concern Present (03/05/2022)   [place]    Feeling of Stress : Not at all  Social Connections: Moderately Integrated (03/05/2022)   Social Connection and Isolation Panel [NHANES]    Frequency of Communication with Friends and Family: Three times a week    Frequency of Social Gatherings with Friends and Family: Three times a week    Attends Religious Services: More than 4 times per year    Active Member of Clubs or Organizations: No    Attends Archivist Meetings: Never    Marital Status: Living with partner     Family History: The patient's family history includes Cancer in his father; Diabetes in his mother; Heart disease in his father and mother; Hyperlipidemia in his father and mother; Obesity in his mother. There is no history of Colon cancer, Esophageal cancer, Rectal cancer, or Stomach cancer.  ROS:   Review of Systems  Constitutional:  Negative for chills and fever.  HENT:  Negative for ear discharge and ear pain.   Eyes:  Negative for double vision and discharge.  Respiratory:  Positive for cough (persistent) and shortness of breath. Negative for hemoptysis.   Cardiovascular:  Positive for chest pain (conjestion) and palpitations (flutter). Negative for orthopnea, claudication, leg swelling and PND.  Gastrointestinal:  Negative for diarrhea and vomiting.  Genitourinary:  Negative for dysuria.  Neurological:  Negative  for tingling and headaches.  Endo/Heme/Allergies:  Negative for polydipsia.  Psychiatric/Behavioral:  Negative for hallucinations and suicidal ideas.      EKGs/Labs/Other Studies Reviewed:    The following studies were reviewed today:  Myoview 11/2021:   Findings are consistent with no prior ischemia and no prior myocardial infarction. The study is intermediate risk.   No ST deviation was noted.   Left ventricular function is abnormal. Nuclear stress EF: 48 %. The left ventricular ejection fraction is mildly decreased (45-54%). End diastolic cavity size is normal. End systolic cavity size is normal.   Prior study not available for comparison.  Findings: Negative for stress induced arrhythmias. No evidence of ischemia or infarction. LV function is mildly reduced.   Conclusions: Stress test is negative. Intermediate risk due to decrease LV function, thought function look better on concomitant echo.  TTE 11/2021: IMPRESSIONS    1. Left ventricular ejection fraction, by estimation, is 65 to 70%. The  left ventricle has normal function. The left ventricle has no regional  wall motion abnormalities. There is mild left ventricular hypertrophy.  Left ventricular diastolic parameters  are consistent with Grade I diastolic dysfunction (impaired relaxation).  The average left ventricular global longitudinal strain is -19.8 %. The  global longitudinal strain is normal.   2. Right ventricular systolic function is normal. The right ventricular  size is normal.   3. The mitral valve leaflets are elongated - no obvious prolapse. The  mitral valve is abnormal. Trivial mitral valve regurgitation.   4. The aortic valve is tricuspid. Aortic valve regurgitation is not  visualized.   5. The inferior vena cava is normal in size with greater than 50%  respiratory variability, suggesting right atrial pressure of 3 mmHg.   6. Cannot exclude a small PFO.   Comparison(s): No prior Echocardiogram.     EKG:  EKG is personally reviewed. 07/29/22: NSR. Rate 83 bpm.  11/08/2021: Sinus rhythm. Rate 98 bpm.   Recent Labs: 10/17/2021: ALT 69; Pro B Natriuretic peptide (BNP)  169.0; TSH 1.40 03/08/2022: BUN 45; Creatinine, Ser 1.33; Hemoglobin 13.5; Platelets 195; Potassium 4.2; Sodium 139   Recent Lipid Panel    Component Value Date/Time   CHOL 143 02/07/2022 0932   TRIG 123 02/07/2022 0932   HDL 45 02/07/2022 0932   CHOLHDL 3.2 02/07/2022 0932   CHOLHDL 4 10/17/2021 1705   VLDL 22.2 10/17/2021 1705   LDLCALC 76 02/07/2022 0932     Risk Assessment/Calculations:           Physical Exam:    VS:  BP 124/72   Pulse 83   Ht '5\' 11"'$  (1.803 m)   Wt (!) 324 lb 6.4 oz (147.1 kg)   SpO2 98%   BMI 45.24 kg/m     Wt Readings from Last 3 Encounters:  07/29/22 (!) 324 lb 6.4 oz (147.1 kg)  06/03/22 (!) 312 lb 3.2 oz (141.6 kg)  05/16/22 (!) 312 lb 3.2 oz (141.6 kg)     GEN: Well nourished, well developed in no acute distress HEENT: Normal NECK: No JVD; No carotid bruits CARDIAC: RRR. No murmurs, rubs or gallops RESPIRATORY:  Clear to auscultation without rales, wheezing or rhonchi  ABDOMEN: Soft, non-tender, non-distended MUSCULOSKELETAL:  No edema; No deformity  SKIN: Warm and dry NEUROLOGIC:  Alert and oriented x 3 PSYCHIATRIC:  Normal affect   ASSESSMENT:    1. DOE (dyspnea on exertion)   2. Essential hypertension   3. SOB (shortness of breath)   4. Pre-operative clearance   5. OSA (obstructive sleep apnea)   6. History of mitral valve prolapse   7. Medication management   8. Palpitations   9. Morbid obesity with BMI of 40.0-44.9, adult ([place])   10. Mixed hyperlipidemia     PLAN:    In order of problems listed above:  #DOE: Reassuring CV work-up. Myoview 11/2021 normal. TTE with LVEF 65-70%, G1DD, no valve disease.  -Continue weight loss efforts  #Palpitations: Occurs intermittently every 2-3 months lasting several hours at a time. No associated SOB,  lightheadedness or chest pain. Discussed that due to low frequency, cardiac monitor is not likely the best option. Recommended Kardia device.   #Cough and Congestion: Has been ongoing for several weeks. Was mildly hypoxic on initial presentation but improved to 98% with rest. Declined CXR or pulm referral at this time but states he will let us know if things worsen. 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  -Increase amlodipine '5mg'$  daily -Change to lisinopril '40mg'$  daily and HCTZ '25mg'$  daily  #HLD: LDL 76, TG 123, HDL 45. -Continue crestor '10mg'$  daily -Repeat lipids in 3 months  #Morbid Obesity: BMI 45. -Discussed weight loss and diet at length  #OSA: -Follows with Dr. Radford Pax -Continue with weight loss efforts  Exercise recommendations: Goal of exercising for at least 30 minutes a day, at least 5 times per week.  Please exercise to a moderate exertion.  This means that while exercising it is difficult to speak in full sentences, however you are not so short of breath that you feel you must stop, and not so comfortable that you can carry on a full conversation.  Exertion level should be approximately a 5/10, if 10 is the most exertion you can perform.  Diet recommendations: Recommend a heart healthy diet such as the Mediterranean diet.  This diet consists of plant based foods, healthy fats, lean meats, olive oil.  It suggests limiting the intake of  simple carbohydrates such as white breads, pastries, and pastas.  It also limits the amount of red meat, wine, and dairy products such as cheese that one should consume on a daily basis.      Shared Decision Making/Informed Consent The risks [chest pain, shortness of breath, cardiac arrhythmias, dizziness, blood pressure fluctuations, myocardial infarction, stroke/transient ischemic attack, nausea, vomiting, allergic reaction, radiation exposure, metallic taste sensation and  life-threatening complications (estimated to be 1 in 10,000)], benefits (risk stratification, diagnosing coronary artery disease, treatment guidance) and alternatives of a nuclear stress test were discussed in detail with Bryan Brock and he agrees to proceed.   Follow-up:  3 months.  Medication Adjustments/Labs and Tests Ordered: Current medicines are reviewed at length with the patient today.  Concerns regarding medicines are outlined above.   Orders Placed This Encounter  Procedures   Lipid Profile   EKG 12-Lead   Meds ordered this encounter  Medications   DISCONTD: amLODipine (NORVASC) 2.5 MG tablet    Sig: Take 1 tablet (2.5 mg total) by mouth daily.    Dispense:  90 tablet    Refill:  1   DISCONTD: rosuvastatin (CRESTOR) 10 MG tablet    Sig: Take 1 tablet (10 mg total) by mouth daily.    Dispense:  90 tablet    Refill:  3   rosuvastatin (CRESTOR) 10 MG tablet    Sig: Take 1 tablet (10 mg total) by mouth daily.    Dispense:  90 tablet    Refill:  3   amLODipine (NORVASC) 5 MG tablet    Sig: Take 1 tablet (5 mg total) by mouth at bedtime.    Dispense:  90 tablet    Refill:  2    Dose increase   lisinopril (ZESTRIL) 40 MG tablet    Sig: Take 1 tablet (40 mg total) by mouth daily.    Dispense:  90 tablet    Refill:  2   hydrochlorothiazide (HYDRODIURIL) 25 MG tablet    Sig: Take 1 tablet (25 mg total) by mouth daily.    Dispense:  90 tablet    Refill:  2   Patient Instructions  Medication Instructions:   STOP TAKING COMBO LISINOPRIL-HCTZ  START TAKING LISINOPRIL 40 MG BY MOUTH DAILY  START TAKING HYDROCHLOROTHIAZIDE 25 MG BY MOUTH DAILY  INCREASE YOUR AMLODIPINE TO 5 MG BY MOUTH DAILY AT BEDTIME  *If you need a refill on your cardiac medications before your next appointment, please call your pharmacy*   Lab Work:  IN 3 MONTHS AT YOUR 3 MONTH OFFICE VISIT WITH [place] NP-- CHECK LIPIDS AT THAT TIME--PLEASE COME FASTING TO THIS LAB APPOINTMENT  If you  have labs (blood work) drawn today and your tests are completely normal, you will receive your results only by: [place] (if you have MyChart) OR A paper copy in the mail If you have any lab test that is abnormal or we need to change your treatment, we will call you to review the results.   Follow-Up:  3 MONTHS WITH MICHELLE SWINYER NP--HAVE YOUR LIPIDS CHECKED AT THIS VISIT   Important Information About Sugar          I,Mitra Faeizi,acting as a scribe for Freada Bergeron, MD.,have documented all relevant documentation on the behalf of Freada Bergeron, MD,as directed by  Freada Bergeron, MD while in the presence of Freada Bergeron, MD.   I, Freada Bergeron, MD, have reviewed all documentation for this visit. The  documentation on 07/29/22 for the exam, diagnosis, procedures, and orders are all accurate and complete.   Signed, Freada Bergeron, MD  07/29/2022 9:26 AM    [facility]

## 2022-07-29 NOTE — Patient Instructions (Signed)
Medication Instructions:   STOP TAKING COMBO LISINOPRIL-HCTZ  START TAKING LISINOPRIL 40 MG BY MOUTH DAILY  START TAKING HYDROCHLOROTHIAZIDE 25 MG BY MOUTH DAILY  INCREASE YOUR AMLODIPINE TO 5 MG BY MOUTH DAILY AT BEDTIME  *If you need a refill on your cardiac medications before your next appointment, please call your pharmacy*   Lab Work:  IN 3 MONTHS AT YOUR 3 MONTH OFFICE VISIT WITH [place] NP-- CHECK LIPIDS AT THAT TIME--PLEASE COME FASTING TO THIS LAB APPOINTMENT  If you have labs (blood work) drawn today and your tests are completely normal, you will receive your results only by: [place] (if you have MyChart) OR A paper copy in the mail If you have any lab test that is abnormal or we need to change your treatment, we will call you to review the results.   Follow-Up:  3 MONTHS WITH MICHELLE SWINYER NP--HAVE YOUR LIPIDS CHECKED AT THIS VISIT   Important Information About Sugar

## 2022-08-01 ENCOUNTER — Encounter: Payer: Self-pay | Admitting: Family Medicine

## 2022-08-01 ENCOUNTER — Ambulatory Visit (INDEPENDENT_AMBULATORY_CARE_PROVIDER_SITE_OTHER): Payer: Medicare Other | Admitting: Family Medicine

## 2022-08-01 VITALS — BP 138/80 | HR 91 | Temp 96.9°F | Ht 71.0 in | Wt 323.0 lb

## 2022-08-01 DIAGNOSIS — R0609 Other forms of dyspnea: Secondary | ICD-10-CM

## 2022-08-01 DIAGNOSIS — I10 Essential (primary) hypertension: Secondary | ICD-10-CM

## 2022-08-01 DIAGNOSIS — Z6841 Body Mass Index (BMI) 40.0 and over, adult: Secondary | ICD-10-CM

## 2022-08-01 NOTE — Progress Notes (Signed)
[facility] [address] [place] [address] Dept: [phone] Dept Fax: [phone]  Chronic Care Office Visit  Subjective:    Patient ID: Bryan Brock, male    DOB: 09-May-1956, 66 y.o..   MRN: [id number]  Chief Complaint  Patient presents with   Follow-up    3 month f/u.  C/o having some SOB.    Average BP at home 136/74 - 150/92.      History of Present Illness:  Patient is in today for reassessment of chronic medical issues.  Bryan Brock has a history of hyperlipidemia. He is managed on rosuvastatin 10 mg daily and omega-3 fatty acid supplement..   Bryan Brock has a history hypertension. He is managed on lisinopril 40 mg daily, HCTZ 25 mg daily, and amlodipine 5 mg daily. The amlodipine dose and lisinopril dose were recently increased by Dr. Johney Brock (cardiology).  Bryan Brock continues to feel an issue with dyspnea on exertion. He understands there is a role related to his obesity. His cardiac exam has not indicated any underlying heart failure. He does note having had COVID twice and each time having a prolonged cough that went on for months.  He has a very minor history of tobacco use. He is trying to follow an improved diet inorder to lose weight. he is not consistently exercising, in part due to feelings of fatigue and dyspnea.  Past Medical History: Patient Active Problem List   Diagnosis Date Noted   OSA (obstructive sleep apnea) 12/19/2021   Essential hypertension 10/29/2021   Elevated transaminase level 10/18/2021   Prediabetes 10/17/2021   Morbid obesity with BMI of 40.0-44.9, adult ([place]) 10/17/2021   Bipolar 1 disorder, depressed ([place]) 05/11/2020   History of basal cell carcinoma 11/05/2007   Borderline hyperlipidemia 11/05/2007   Premature ventricular contractions 11/05/2007   History of mitral valve prolapse 11/05/2007   Past Surgical History:  Procedure Laterality Date   HYDROCELE EXCISION Bilateral  03/22/2022   Procedure: HYDROCELECTOMY ADULT;  Surgeon: Robley Fries, MD;  Location: [facility];  Service: Urology;  Laterality: Bilateral;  2 HRS   MOHS SURGERY  1990   STRABISMUS SURGERY Left    TONSILLECTOMY AND ADENOIDECTOMY     WISDOM TOOTH EXTRACTION     Family History  Problem Relation Age of Onset   Heart disease Mother    Diabetes Mother    Obesity Mother    Hyperlipidemia Mother    Cancer Father        skin cancer   Heart disease Father    Hyperlipidemia Father    Colon cancer Neg Hx    Esophageal cancer Neg Hx    Rectal cancer Neg Hx    Stomach cancer Neg Hx    Outpatient Medications Prior to Visit  Medication Sig Dispense Refill   albuterol (VENTOLIN HFA) 108 (90 Base) MCG/ACT inhaler SMARTSIG:1 Puff(s) By Mouth Every 6 Hours PRN     amLODipine (NORVASC) 5 MG tablet Take 1 tablet (5 mg total) by mouth at bedtime. 90 tablet 2   APPLE CIDER VINEGAR PO Take 15 mLs by mouth every Monday, Wednesday, and Friday.     Ascorbic Acid (VITAMIN C) 1000 MG tablet Take 3,000 mg by mouth daily.     calcium elemental as carbonate (TUMS ULTRA 1000) 400 MG chewable tablet Chew 2,000 mg by mouth 3 (three) times daily as needed for heartburn.     Cholecalciferol (DIALYVITE VITAMIN D 5000) 125 MCG (5000 UT) capsule  Take 20,000 Units by mouth daily.     Coenzyme Q10 (COQ-10) 100 MG CAPS Take 100 mg by mouth daily.     Flaxseed, Linseed, (FLAX SEED OIL) 1000 MG CAPS Take 1,000 mg by mouth daily.     folic acid (FOLVITE) 400 MCG tablet Take 800 mcg by mouth daily.     hydrochlorothiazide (HYDRODIURIL) 25 MG tablet Take 1 tablet (25 mg total) by mouth daily. 90 tablet 2   lamoTRIgine (LAMICTAL) 100 MG tablet Take 100 mg by mouth daily.     Liniments (BLUE-EMU SUPER STRENGTH EX) Apply 1 Application topically daily as needed (pain).     lisinopril (ZESTRIL) 40 MG tablet Take 1 tablet (40 mg total) by mouth daily. 90 tablet 2   magnesium oxide (MAG-OX) 400 MG tablet Take 400 mg by mouth daily.      Multiple Vitamins-Minerals (MULTIVITAMIN WITH MINERALS) tablet Take 1 tablet by mouth daily.     naproxen sodium (ALEVE) 220 MG tablet Take 220 mg by mouth See admin instructions. Take 220 mg in the morning, may take a second 220 mg dose as needed for pain     Omega 3 1000 MG CAPS Take 1,000 mg by mouth daily.     OVER THE COUNTER MEDICATION Nano CBD OIL: Pt takes 20 drops in the morning and 20 drops in the evening.     rosuvastatin (CRESTOR) 10 MG tablet Take 1 tablet (10 mg total) by mouth daily. 90 tablet 3   sildenafil (VIAGRA) 50 MG tablet Take 50 mg by mouth daily as needed for erectile dysfunction.     zinc gluconate 50 MG tablet Take 50 mg by mouth daily.     No facility-administered medications prior to visit.   Allergies  Allergen Reactions   Latex Dermatitis   Objective:   Today's Vitals   08/01/22 0943  BP: 138/80  Pulse: 91  Temp: (!) 96.9 F (36.1 C)  TempSrc: Temporal  SpO2: 99%  Weight: (!) 323 lb (146.5 kg)  Height: '5\' 11"'$  (1.803 m)   Body mass index is 45.05 kg/m.   General: Well developed, well nourished. No acute distress. Lungs: Clear to auscultation bilaterally. No wheezing, rales or rhonchi. CV: RRR without murmurs or rubs. Pulses 2+ bilaterally. Psych: Alert and oriented. Normal mood and affect.  Health Maintenance Due  Topic Date Due   Hepatitis C Screening  Never done   Zoster Vaccines- Shingrix (1 of 2) Never done     Assessment & Plan:   1. Essential hypertension Blood pressure is still mildly high today. Home blood pressures had been consistently in the 140s/~80. For now, we will continue the current regimen which started earlier this week of isinopril 40 mg daily, HCTZ 25 mg daily, and amlodipine 5 mg daily.  2. Morbid obesity with BMI of 40.0-44.9, adult ([facility]) Maximum weight: 324 lbs (07/29/2022) Current weight: 323 lbs Weight change since last visit: + 15 lbs  I support any effort Bryan Brock can make towards weight loss. He has a RMR  of ~ 2200 cal. We discussed the concept of a calorie deficit and what is needed to lose weight. We also discussed how including regular exercise can make it easier for him to achieve the calorie deficit needed. He will start working at this with a goal to be under 300 lbs. by March.  3. Dyspnea on exertion I suspect this is multifactorial. Despite his priro severe reaction to COVID infection, he has a normal O2 and normal lung  exam. I do not think he has any post-COVID pulmonary fibrosis. He has been ruled out for significant heart disease. I suspect the major issue is his need for weight loss.   Return in about 3 months (around 10/31/2022) for Reassessment.   Haydee Salter, MD

## 2022-10-16 NOTE — H&P (View-Only) (Signed)
Cardiology Office Note:    Date:  10/28/2022   ID:  Bryan Brock, DOB 07/08/1956, MRN [id number]  PCP:  Rudd, Stephen M, MD   [facility] Providers Cardiologist:  None     Referring MD: Rudd, Stephen M, MD   Chief Complaint: follow-up hypertension  History of Present Illness:    Bryan Brock is a very pleasant 66 y.o. male with a hx of MVP, symptomatic PVCs, hypertension, hyperlipidemia, family hx CAD, and morbid obesity.    He was referred to cardiology for evaluation of MVP.  Diagnosed at age 30 to 67 years old.  Previously seen by Dr. Campbell.  Around that time he had sudden syncope episode.  Described prodrome of warm rashes, lightheadedness, and arrhythmias.  TTE at that time with mild MVP.  Holter per report with no significant arrhythmias.  Was then followed by Dr. Kelly but has not been seen in several years.  Seen by Dr. Pemberton on 11/08/21. BP elevated at PCP 180/100.  EKGs showed possible LAE and RVH. Due to significant hypertension, pedal edema, and abnormal EKG he was referred to cardiology to assess for heart failure prior to surgery. Seen in follow-up by PCP on 10/29/2021 BP was 162/94. Was started on 10-12.5 mg lisinopril-HCTZ daily. 5 episodes of palpitations or skipped beats.  Described  palpitations as pronounced and start suddenly with no known triggers, able to feel in his cerebral circulation in his chest. Episodes last 30 minutes to several hours and may wake him at night. Usually occur every 2-4 months. Voiced concerns about his weight of 303 pounds in clinic. Wanted to pursue weight reduction program and exercise, however having shortness of breath and elevated heart rates with minimal exertion and limited by issues with right knee. Cardiac testing was ordered in order to clear patient for surgery. Sleep test revealed very mild OSA. Referred to Dr. Turner for sleep management. Nuclear stress test showed no evidence of ischemia or infarction. Echo revealed normal LVEF,  G1DD, mild LVH, no rwma, elongated mitral valve leaflets with no obvious prolapse.  He was subsequently cleared for surgery on 12/31/21.  Seen in clinic by me on 02/14/22 to complete surgical clearance for hydrocelectomy scheduled for late July. BP readings systolic average 140s, diastolic 80s-90s.  He recently dropped off 5 days worth of BP readings to PCP. Reports no further palpitations. These were previously very pronounced and would keep him from sleeping but he is sleeping very well now. Continues to work part-time.  Activity is limited by right knee pain - bone on bone, will eventually need replacement.  Continues to have dyspnea with exertion, no chest pain. Feels that DOE is secondary to his weight.  Admits he loves to eat, food is a comfort to him. Girlfriend only cooks healthy meals, no sweats but he tends to eat things he shouldn't at breakfast and lunch.  Was told CPAP not needed for mild OSA.   Seen in clinic by Dr. Pemberton on 07/29/22 at which time he had gained 12-16 lbs. Palpitations occurring infrequently but lasting for hours at the time. Encouraged to get Kardia device. BP medication adjustments - stopped lisinopril-hctz and advised to take lisinopril 40 mg daily, hctz 25 mg daily, and increase amlodipine to 5 mg daily.  Lengthy discussion about the importance of weight loss.  He was advised to follow-up in 3 months.  Today, he is here alone for follow-up. Reports he continues to have cough that has been persistent for > 1 year.   Had COVID infection January 2023 and feels that cough has been persistent since that time. Also recently had URI that worsened cough, but cough has never fully resolved. BP has been well controlled. On one occasion, had a couple of hours of palpitations. These resolved on their own and overall he feels that palpitations occur infrequently. Has Kardia monitor at home - has not captured any readings. Continues to have dyspnea on exertion. 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 Feels very fatigued at the end of shift, like he cannot do anything else. Admits that he continues to struggle to lose weight. Has bilateral LE 1+ pitting edema. He denies chest pain, melena, hematuria, hemoptysis, diaphoresis, weakness, presyncope, syncope, orthopnea, and PND.  Past Medical History:  Diagnosis Date   Acne    Arthritis    Asthma    Bipolar 1 disorder (HCC)    Cancer (HCC)    basal cell cancer removed from back   Cataract    Depression    Dysrhythmia    PVCs   GERD (gastroesophageal reflux disease)    Heart murmur    Hepatitis    remote hx Hepatitis A   Hyperlipidemia    Hypertension    Mitral valve prolapse    Panic attacks    PFO (patent foramen ovale)    ? small PFO per echo   Pre-diabetes    Sleep apnea     Past Surgical History:  Procedure Laterality Date   HYDROCELE EXCISION Bilateral 03/22/2022   Procedure: HYDROCELECTOMY ADULT;  Surgeon: Pace, Maryellen D, MD;  Location: [facility];  Service: Urology;  Laterality: Bilateral;  2 HRS   MOHS SURGERY  1990   STRABISMUS SURGERY Left    TONSILLECTOMY AND ADENOIDECTOMY     WISDOM TOOTH EXTRACTION      Current Medications: Current Meds  Medication Sig   albuterol (VENTOLIN HFA) 108 (90 Base) MCG/ACT inhaler SMARTSIG:1 Puff(s) By Mouth Every 6 Hours PRN   amLODipine (NORVASC) 5 MG tablet Take 1 tablet (5 mg total) by mouth daily. (Patient taking differently: Take 5 mg by mouth at bedtime.)   APPLE CIDER VINEGAR PO Take 15 mLs by mouth every Monday, Wednesday, and Friday.   Ascorbic Acid (VITAMIN C) 1000 MG tablet Take 3,000 mg by mouth daily.   calcium elemental as carbonate (TUMS ULTRA 1000) 400 MG chewable tablet Chew 2,000 mg by mouth 3 (three) times daily as needed for heartburn.   Cholecalciferol (DIALYVITE VITAMIN D 5000) 125 MCG (5000 UT) capsule Take 20,000 Units by mouth daily.   Coenzyme Q10 (COQ-10) 100 MG CAPS Take 100 mg by mouth daily.   Flaxseed, Linseed,  (FLAX SEED OIL) 1000 MG CAPS Take 1,000 mg by mouth daily.   folic acid (FOLVITE) 800 MCG tablet Take 800 mcg by mouth daily.   hydrochlorothiazide (HYDRODIURIL) 25 MG tablet Take 1 tablet (25 mg total) by mouth daily.   lamoTRIgine (LAMICTAL) 100 MG tablet Take 100 mg by mouth daily.   Liniments (BLUE-EMU SUPER STRENGTH EX) Apply 1 Application topically daily as needed (pain).   magnesium oxide (MAG-OX) 400 MG tablet Take 400 mg by mouth daily.   metoprolol tartrate (LOPRESSOR) 100 MG tablet Take one (1) tablet by mouth (100 mg) 2 hours prior to CT scan.   Multiple Vitamins-Minerals (MULTIVITAMIN WITH MINERALS) tablet Take 1 tablet by mouth daily.   naproxen sodium (ALEVE) 220 MG tablet Take 220 mg by mouth See admin instructions. Take   220 mg in the morning, may take a second 220 mg dose as needed for pain   Omega 3 1000 MG CAPS Take 1,000 mg by mouth daily.   OVER THE COUNTER MEDICATION Nano CBD OIL: Pt takes 20 drops in the morning and 20 drops in the evening.   rosuvastatin (CRESTOR) 10 MG tablet Take 1 tablet (10 mg total) by mouth daily.   sildenafil (VIAGRA) 50 MG tablet Take 50 mg by mouth daily as needed for erectile dysfunction.   valsartan (DIOVAN) 320 MG tablet Take 1 tablet (320 mg total) by mouth daily.   zinc gluconate 50 MG tablet Take 50 mg by mouth daily.   [DISCONTINUED] amLODipine (NORVASC) 5 MG tablet Take 1 tablet (5 mg total) by mouth at bedtime.   [DISCONTINUED] lisinopril (ZESTRIL) 40 MG tablet Take 1 tablet (40 mg total) by mouth daily.     Allergies:   Latex   Social History   Socioeconomic History   Marital status: Domestic Partner    Spouse name: Not on file   Number of children: 2   Years of education: Not on file   Highest education level: Not on file  Occupational History   Occupation: Hardware Salesman    Comment: Part-Time  Tobacco Use   Smoking status: Never   Smokeless tobacco: Current    Types: Snuff  Vaping Use   Vaping Use: Never used   Substance and Sexual Activity   Alcohol use: Yes    Comment: occasionally   Drug use: Never   Sexual activity: Yes  Other Topics Concern   Not on file  Social History Narrative   Not on file   Social Determinants of Health   Financial Resource Strain: Low Risk  (03/05/2022)   Overall Financial Resource Strain (CARDIA)    Difficulty of Paying Living Expenses: Not hard at all  Food Insecurity: No Food Insecurity (03/05/2022)   Hunger Vital Sign    Worried About Running Out of Food in the Last Year: Never true    Ran Out of Food in the Last Year: Never true  Transportation Needs: No Transportation Needs (03/05/2022)   PRAPARE - Transportation    Lack of Transportation (Medical): No    Lack of Transportation (Non-Medical): No  Physical Activity: Inactive (03/05/2022)   Exercise Vital Sign    Days of Exercise per Week: 0 days    Minutes of Exercise per Session: 0 min  Stress: No Stress Concern Present (03/05/2022)   [employer] - Occupational Stress Questionnaire    Feeling of Stress : Not at all  Social Connections: Moderately Integrated (03/05/2022)   Social Connection and Isolation Panel [NHANES]    Frequency of Communication with Friends and Family: Three times a week    Frequency of Social Gatherings with Friends and Family: Three times a week    Attends Religious Services: More than 4 times per year    Active Member of Clubs or Organizations: No    Attends Club or Organization Meetings: Never    Marital Status: Living with partner     Family History: The patient's family history includes Cancer in his father; Diabetes in his mother; Heart disease in his father and mother; Hyperlipidemia in his father and mother; Obesity in his mother. There is no history of Colon cancer, Esophageal cancer, Rectal cancer, or Stomach cancer.  ROS:   Please see the history of present illness.    + DOE All other systems reviewed and are negative.    Labs/Other  Studies Reviewed:    The following studies were reviewed today:  Lexiscan myoview 12/14/21    Findings are consistent with no prior ischemia and no prior myocardial infarction. The study is intermediate risk.   No ST deviation was noted.   Left ventricular function is abnormal. Nuclear stress EF: 48 %. The left ventricular ejection fraction is mildly decreased (45-54%). End diastolic cavity size is normal. End systolic cavity size is normal.   Prior study not available for comparison.   Findings: Negative for stress induced arrhythmias. No evidence of ischemia or infarction. LV function is mildly reduced.   Conclusions: Stress test is negative. Intermediate risk due to decrease LV function, thought function look better on concomitant echo  Echo 12/14/21  1. Left ventricular ejection fraction, by estimation, is 65 to 70%. The  left ventricle has normal function. The left ventricle has no regional  wall motion abnormalities. There is mild left ventricular hypertrophy.  Left ventricular diastolic parameters  are consistent with Grade I diastolic dysfunction (impaired relaxation).  The average left ventricular global longitudinal strain is -19.8 %. The  global longitudinal strain is normal.   2. Right ventricular systolic function is normal. The right ventricular  size is normal.   3. The mitral valve leaflets are elongated - no obvious prolapse. The  mitral valve is abnormal. Trivial mitral valve regurgitation.   4. The aortic valve is tricuspid. Aortic valve regurgitation is not  visualized.   5. The inferior vena cava is normal in size with greater than 50%  respiratory variability, suggesting right atrial pressure of 3 mmHg.   6. Cannot exclude a small PFO.   Recent Labs: 03/08/2022: BUN 45; Creatinine, Ser 1.33; Hemoglobin 13.5; Platelets 195; Potassium 4.2; Sodium 139  Recent Lipid Panel    Component Value Date/Time   CHOL 143 02/07/2022 0932   TRIG 123 02/07/2022 0932    HDL 45 02/07/2022 0932   CHOLHDL 3.2 02/07/2022 0932   CHOLHDL 4 10/17/2021 1705   VLDL 22.2 10/17/2021 1705   LDLCALC 76 02/07/2022 0932     Risk Assessment/Calculations:      Physical Exam:    VS:  BP 130/78   Pulse 86   Ht 5' 11" (1.803 m)   Wt (!) 325 lb 9.6 oz (147.7 kg)   SpO2 94%   BMI 45.41 kg/m     Wt Readings from Last 3 Encounters:  10/28/22 (!) 325 lb 9.6 oz (147.7 kg)  08/01/22 (!) 323 lb (146.5 kg)  07/29/22 (!) 324 lb 6.4 oz (147.1 kg)     GEN: Well developed, obese gentleman in no acute distress HEENT: Normal NECK: No JVD; No carotid bruits CARDIAC: RRR, no murmurs, rubs, gallops RESPIRATORY:  Coarse breath sounds bilaterally with expiratory wheezing   ABDOMEN: Soft, non-tender, protuberant MUSCULOSKELETAL:  No edema; No deformity. 2+ pedal pulses, equal bilaterally SKIN: Warm and dry NEUROLOGIC:  Alert and oriented x 3 PSYCHIATRIC:  Normal affect   EKG:  EKG is not ordered today.   Diagnoses:    1. DOE (dyspnea on exertion)   2. Other fatigue   3. Essential hypertension   4. Chronic heart failure with preserved ejection fraction (HFpEF) (HCC)   5. Palpitations   6. Coronary artery disease involving other coronary artery bypass graft without angina pectoris   7. Hyperlipidemia LDL goal <70   8. Chronic cough   9. OSA (obstructive sleep apnea)   10. History of mitral valve prolapse     Assessment   and Plan:     Cough: Persistent cough and wheezing x 1 year. Initially thought it was residual of COVID infection 08/2021. We will try stopping his lisinopril to see if cough improves. Will start valsartan 320 mg after stopping lisinopril for a few days. We discussed possible referral to pulmonology if symptoms do not improve off ACEi.   Dyspnea on exertion/Chronic HFpEF: He continues to have DOE. Also very fatigued at end of work day. LVEF 65-70%, G1 DD, no obvious MVP on echo 11/2021. 1+ bilateral LE edema. Body habitus makes it difficult to assess  volume overload. No orthopnea, PND. No ischemia on lexiscan myoview 11/2021. Father had valve replacement and bypass surgery. Concern that this may be angina equivalent. Will get coronary CTA to define coronary anatomy.  Will have him take Lopressor 100 mg prior to CT. As noted above, consider referral to pulmonology. ADDENDUM: Coronary CTA revealed concerning stenosis in D2, LCx, and RCA.  I have talked with the patient and reviewed risks of cardiac catheterization and he agrees to proceed.  Fatigue: Having significant fatigue accompanied by DOE. Concern that this may be angina equivalent. Will get coronary CTA to evaluate coronary anatomy. Will get TSH to evaluate for thyroid dysfunction which may be contributing.   Hypertension: BP is well controlled. We are stopping lisinopril to see if it is contributing to cough. Start valsartan 320 mg daily and continue to monitor BP. Notify us if cough does not improve off ACEi.   Palpitations: Quiescent at this time. Consider addition of beta blocker if symptoms return or if BP is not well controlled on ARB.  Mitral valve prolapse: Echo 11/2021 with mitral valve leaflets elongated, no obvious prolapse, trivial MR. He is asymptomatic. Continue to monitor with repeat echo in 3-5 years, sooner if clinically indicated.   Hyperlipidemia: LDL 76 on 02/07/2022. We will recheck today. Await CT results for stricter LDL goal if calcification present. Continue rosuvastatin.  OSA: Mild by sleep test on 12/18/21. Seen by Dr. Turner for sleep management with no recommendation to pursue PAP therapy at this time.   Morbid obesity: Admits to difficulty losing weight. We discussed the impact of obesity on his overall health. Continue to work on healthy diet and weight loss.    Disposition: 2 months with me   Shared Decision Making/Informed Consent The risks [stroke (1 in 1000), death (1 in 1000), kidney failure [usually temporary] (1 in 500), bleeding (1 in 200), allergic  reaction [possibly serious] (1 in 200)], benefits (diagnostic support and management of coronary artery disease) and alternatives of a cardiac catheterization were discussed in detail with Mr. Decarlo and he is willing to proceed.   Medication Adjustments/Labs and Tests Ordered: Current medicines are reviewed at length with the patient today.  Concerns regarding medicines are outlined above.  Orders Placed This Encounter  Procedures   CT CORONARY MORPH W/CTA COR W/SCORE W/CA W/CM &/OR WO/CM   Comp Met (CMET)   Lipid Profile   Meds ordered this encounter  Medications   valsartan (DIOVAN) 320 MG tablet    Sig: Take 1 tablet (320 mg total) by mouth daily.    Dispense:  90 tablet    Refill:  3   metoprolol tartrate (LOPRESSOR) 100 MG tablet    Sig: Take one (1) tablet by mouth (100 mg) 2 hours prior to CT scan.    Dispense:  1 tablet    Refill:  0    Patient Instructions  Medication Instructions:   DISCONTINUE Lisinopril    START Valsartan one (1) tablet by mouth (320 mg) daily.   *If you need a refill on your cardiac medications before your next appointment, please call your pharmacy*   Lab Work:  TODAY LIPID/CMET  If you have labs (blood work) drawn today and your tests are completely normal, you will receive your results only by: MyChart Message (if you have MyChart) OR A paper copy in the mail If you have any lab test that is abnormal or we need to change your treatment, we will call you to review the results.   Testing/Procedures:    Your cardiac CT will be scheduled at one of the below locations:   [facility] [address] [place], [place] [address] [phone]  If scheduled at [facility], please arrive at the Women's and Children's Entrance (Entrance C2) of [facility] 30 minutes prior to test start time. You can use the FREE valet parking offered at entrance C (encouraged to control the heart rate for the test)  Proceed to the  [facility] Department (first floor) to check-in and test prep.  All radiology patients and guests should use entrance C2 at [facility], accessed from [address], even though the hospital's physical address listed is [address].    Please follow these instructions carefully (unless otherwise directed):  Hold all erectile dysfunction medications at least 3 days (72 hrs) prior to test. (Ie viagra, cialis, sildenafil, tadalafil, etc) We will administer nitroglycerin during this exam.   On the Night Before the Test: Be sure to Drink plenty of water. Do not consume any caffeinated/decaffeinated beverages or chocolate 12 hours prior to your test. Do not take any antihistamines 12 hours prior to your test.  On the Day of the Test: Drink plenty of water until 1 hour prior to the test. Do not eat any food 1 hour prior to test. You may take your regular medications prior to the test.  Take metoprolol (Lopressor) one tablet by mouth (100 mg) two hours prior to test. If you take Hydrochlorothiazide  please HOLD on the morning of the test.        After the Test: Drink plenty of water. After receiving IV contrast, you may experience a mild flushed feeling. This is normal. On occasion, you may experience a mild rash up to 24 hours after the test. This is not dangerous. If this occurs, you can take Benadryl 25 mg and increase your fluid intake. If you experience trouble breathing, this can be serious. If it is severe call 911 IMMEDIATELY. If it is mild, please call our office.  We will call to schedule your test 2-4 weeks out understanding that some insurance companies will need an authorization prior to the service being performed.   For non-scheduling related questions, please contact the cardiac imaging nurse navigator should you have any questions/concerns: Sara Wallace, Cardiac Imaging Nurse Navigator Merle Prescott, Cardiac Imaging Nurse  Navigator [facility] Direct Office Dial: [phone]   For scheduling needs, including cancellations and rescheduling, please call Brittany, [phone].    Follow-Up: At [facility], you and your health needs are our priority.  As part of our continuing mission to provide you with exceptional heart care, we have created designated Provider Care Teams.  These Care Teams include your primary Cardiologist (physician) and Advanced Practice Providers (APPs -  Physician Assistants and Nurse Practitioners) who all work together to provide you with the care   you need, when you need it.  We recommend signing up for the patient portal called "MyChart".  Sign up information is provided on this After Visit Summary.  MyChart is used to connect with patients for Virtual Visits (Telemedicine).  Patients are able to view lab/test results, encounter notes, upcoming appointments, etc.  Non-urgent messages can be sent to your provider as well.   To learn more about what you can do with MyChart, go to [email or web address].    Your next appointment:   2 month(s)  Provider:   Sidra Oldfield, NP            Signed, Tobin Cadiente M, NP  10/28/2022 10:56 AM    [facility]  

## 2022-10-16 NOTE — Progress Notes (Addendum)
Cardiology Office Note:    Date:  10/28/2022   ID:  Bryan Brock, DOB 12-Sep-1955, MRN [id number]  PCP:  Bryan Salter, MD   [facility] Providers Cardiologist:  None     Referring MD: Bryan Salter, MD   Chief Complaint: follow-up hypertension  History of Present Illness:    Bryan Brock is a very pleasant 67 y.o. male with a hx of MVP, symptomatic PVCs, hypertension, hyperlipidemia, family hx CAD, and morbid obesity.    He was referred to cardiology for evaluation of MVP.  Diagnosed at age 67 to 67 years old.  Previously seen by Dr. Megan Brock.  Around that time he had sudden syncope episode.  Described prodrome of warm rashes, lightheadedness, and arrhythmias.  TTE at that time with mild MVP.  Holter per report with no significant arrhythmias.  Was then followed by Dr. Claiborne Brock but has not been seen in several years.  Seen by Dr. Johney Brock on 11/08/21. BP elevated at PCP 180/100.  EKGs showed possible LAE and RVH. Due to significant hypertension, pedal edema, and abnormal EKG he was referred to cardiology to assess for heart failure prior to surgery. Seen in follow-up by PCP on 10/29/2021 BP was 162/94. Was started on 10-12.5 mg lisinopril-HCTZ daily. 5 episodes of palpitations or skipped beats.  Described  palpitations as pronounced and start suddenly with no known triggers, able to feel in his cerebral circulation in his chest. Episodes last 30 minutes to several hours and may wake him at night. Usually occur every 2-4 months. Voiced concerns about his weight of 303 pounds in clinic. Wanted to pursue weight reduction program and exercise, however having shortness of breath and elevated heart rates with minimal exertion and limited by issues with right knee. Cardiac testing was ordered in order to clear patient for surgery. Sleep test revealed very mild OSA. Referred to Dr. Radford Brock for sleep management. Nuclear stress test showed no evidence of ischemia or infarction. Echo revealed normal LVEF,  G1DD, mild LVH, no rwma, elongated mitral valve leaflets with no obvious prolapse.  He was subsequently cleared for surgery on 12/31/21.  Seen in clinic by me on 02/14/22 to complete surgical clearance for hydrocelectomy scheduled for late July. BP readings systolic average 0000000, diastolic [id number].  He recently dropped off 5 days worth of BP readings to PCP. Reports no further palpitations. These were previously very pronounced and would keep him from sleeping but he is sleeping very well now. Continues to work part-time.  Activity is limited by right knee pain - bone on bone, will eventually need replacement.  Continues to have dyspnea with exertion, no chest pain. Feels that DOE is secondary to his weight.  Admits he loves to eat, food is a comfort to him. Girlfriend only cooks healthy meals, no sweats but he tends to eat things he shouldn't at breakfast and lunch.  Was told CPAP not needed for mild OSA.   Seen in clinic by Dr. Johney Brock on 07/29/22 at which time he had gained 12-16 lbs. Palpitations occurring infrequently but lasting for hours at the time. Encouraged to get [place] device. BP medication adjustments - stopped lisinopril-hctz and advised to take lisinopril 40 mg daily, hctz 25 mg daily, and increase amlodipine to 5 mg daily.  Lengthy discussion about the importance of weight loss.  He was advised to follow-up in 3 months.  Today, he is here alone for follow-up. Reports he continues to have cough that has been persistent for > 1 year.  Had COVID infection January 2023 and feels that cough has been persistent since that time. Also recently had URI that worsened cough, but cough has never fully resolved. BP has been well controlled. On one occasion, had a couple of hours of palpitations. These resolved on their own and overall he feels that palpitations occur infrequently. Has Kardia monitor at home - has not captured any readings. Continues to have dyspnea on exertion. Works at a [employer] and  is on his feet, up and down ladders. Feels very fatigued at the end of shift, like he cannot do anything else. Admits that he continues to struggle to lose weight. Has bilateral LE 1+ pitting edema. He denies chest pain, melena, hematuria, hemoptysis, diaphoresis, weakness, presyncope, syncope, orthopnea, and PND.  Past Medical History:  Diagnosis Date   Acne    Arthritis    Asthma    Bipolar 1 disorder ([place])    Cancer ([place])    basal cell cancer removed from back   Cataract    Depression    Dysrhythmia    PVCs   GERD (gastroesophageal reflux disease)    Heart murmur    Hepatitis    remote hx Hepatitis A   Hyperlipidemia    Hypertension    Mitral valve prolapse    Panic attacks    PFO (patent foramen ovale)    ? small PFO per echo   Pre-diabetes    Sleep apnea     Past Surgical History:  Procedure Laterality Date   HYDROCELE EXCISION Bilateral 03/22/2022   Procedure: HYDROCELECTOMY ADULT;  Surgeon: Bryan Fries, MD;  Location: [facility];  Service: Urology;  Laterality: Bilateral;  2 HRS   MOHS SURGERY  1990   STRABISMUS SURGERY Left    TONSILLECTOMY AND ADENOIDECTOMY     WISDOM TOOTH EXTRACTION      Current Medications: Current Meds  Medication Sig   albuterol (VENTOLIN HFA) 108 (90 Base) MCG/ACT inhaler SMARTSIG:1 Puff(s) By Mouth Every 6 Hours PRN   amLODipine (NORVASC) 5 MG tablet Take 1 tablet (5 mg total) by mouth daily. (Patient taking differently: Take 5 mg by mouth at bedtime.)   APPLE CIDER VINEGAR PO Take 15 mLs by mouth every Monday, Wednesday, and Friday.   Ascorbic Acid (VITAMIN C) 1000 MG tablet Take 3,000 mg by mouth daily.   calcium elemental as carbonate (TUMS ULTRA 1000) 400 MG chewable tablet Chew 2,000 mg by mouth 3 (three) times daily as needed for heartburn.   Cholecalciferol (DIALYVITE VITAMIN D 5000) 125 MCG (5000 UT) capsule Take 20,000 Units by mouth daily.   Coenzyme Q10 (COQ-10) 100 MG CAPS Take 100 mg by mouth daily.   Flaxseed, Linseed,  (FLAX SEED OIL) 1000 MG CAPS Take 1,000 mg by mouth daily.   folic acid (FOLVITE) Q000111Q MCG tablet Take 800 mcg by mouth daily.   hydrochlorothiazide (HYDRODIURIL) 25 MG tablet Take 1 tablet (25 mg total) by mouth daily.   lamoTRIgine (LAMICTAL) 100 MG tablet Take 100 mg by mouth daily.   Liniments (BLUE-EMU SUPER STRENGTH EX) Apply 1 Application topically daily as needed (pain).   magnesium oxide (MAG-OX) 400 MG tablet Take 400 mg by mouth daily.   metoprolol tartrate (LOPRESSOR) 100 MG tablet Take one (1) tablet by mouth (100 mg) 2 hours prior to CT scan.   Multiple Vitamins-Minerals (MULTIVITAMIN WITH MINERALS) tablet Take 1 tablet by mouth daily.   naproxen sodium (ALEVE) 220 MG tablet Take 220 mg by mouth See admin instructions. Take  220 mg in the morning, may take a second 220 mg dose as needed for pain   Omega 3 1000 MG CAPS Take 1,000 mg by mouth daily.   OVER THE COUNTER MEDICATION Nano CBD OIL: Pt takes 20 drops in the morning and 20 drops in the evening.   rosuvastatin (CRESTOR) 10 MG tablet Take 1 tablet (10 mg total) by mouth daily.   sildenafil (VIAGRA) 50 MG tablet Take 50 mg by mouth daily as needed for erectile dysfunction.   valsartan (DIOVAN) 320 MG tablet Take 1 tablet (320 mg total) by mouth daily.   zinc gluconate 50 MG tablet Take 50 mg by mouth daily.   [DISCONTINUED] amLODipine (NORVASC) 5 MG tablet Take 1 tablet (5 mg total) by mouth at bedtime.   [DISCONTINUED] lisinopril (ZESTRIL) 40 MG tablet Take 1 tablet (40 mg total) by mouth daily.     Allergies:   Latex   Social History   Socioeconomic History   Marital status: Soil scientist    Spouse name: Not on file   Number of children: 2   Years of education: Not on file   Highest education level: Not on file  Occupational History   Occupation: Administrator, [employer] Service    Comment: Part-Time  Tobacco Use   Smoking status: Never   Smokeless tobacco: Current    Types: Snuff  Vaping Use   Vaping Use: Never used   Substance and Sexual Activity   Alcohol use: Yes    Comment: occasionally   Drug use: Never   Sexual activity: Yes  Other Topics Concern   Not on file  Social History Narrative   Not on file   Social Determinants of Health   Financial Resource Strain: Low Risk  (03/05/2022)   Overall Financial Resource Strain (CARDIA)    Difficulty of Paying Living Expenses: Not hard at all  Food Insecurity: No Food Insecurity (03/05/2022)   Hunger Vital Sign    Worried About Running Out of Food in the Last Year: Never true    Ran Out of Food in the Last Year: Never true  Transportation Needs: No Transportation Needs (03/05/2022)   PRAPARE - Hydrologist (Medical): No    Lack of Transportation (Non-Medical): No  Physical Activity: Inactive (03/05/2022)   Exercise Vital Sign    Days of Exercise per Week: 0 days    Minutes of Exercise per Session: 0 min  Stress: No Stress Concern Present (03/05/2022)   [place]    Feeling of Stress : Not at all  Social Connections: Moderately Integrated (03/05/2022)   Social Connection and Isolation Panel [NHANES]    Frequency of Communication with Friends and Family: Three times a week    Frequency of Social Gatherings with Friends and Family: Three times a week    Attends Religious Services: More than 4 times per year    Active Member of Clubs or Organizations: No    Attends Archivist Meetings: Never    Marital Status: Living with partner     Family History: The patient's family history includes Cancer in his father; Diabetes in his mother; Heart disease in his father and mother; Hyperlipidemia in his father and mother; Obesity in his mother. There is no history of Colon cancer, Esophageal cancer, Rectal cancer, or Stomach cancer.  ROS:   Please see the history of present illness.    + DOE All other systems reviewed and are negative.  Labs/Other  Studies Reviewed:    The following studies were reviewed today:  Leane Call 12/14/21    Findings are consistent with no prior ischemia and no prior myocardial infarction. The study is intermediate risk.   No ST deviation was noted.   Left ventricular function is abnormal. Nuclear stress EF: 48 %. The left ventricular ejection fraction is mildly decreased (45-54%). End diastolic cavity size is normal. End systolic cavity size is normal.   Prior study not available for comparison.   Findings: Negative for stress induced arrhythmias. No evidence of ischemia or infarction. LV function is mildly reduced.   Conclusions: Stress test is negative. Intermediate risk due to decrease LV function, thought function look better on concomitant echo  Echo 12/14/21  1. Left ventricular ejection fraction, by estimation, is 65 to 70%. The  left ventricle has normal function. The left ventricle has no regional  wall motion abnormalities. There is mild left ventricular hypertrophy.  Left ventricular diastolic parameters  are consistent with Grade I diastolic dysfunction (impaired relaxation).  The average left ventricular global longitudinal strain is -19.8 %. The  global longitudinal strain is normal.   2. Right ventricular systolic function is normal. The right ventricular  size is normal.   3. The mitral valve leaflets are elongated - no obvious prolapse. The  mitral valve is abnormal. Trivial mitral valve regurgitation.   4. The aortic valve is tricuspid. Aortic valve regurgitation is not  visualized.   5. The inferior vena cava is normal in size with greater than 50%  respiratory variability, suggesting right atrial pressure of 3 mmHg.   6. Cannot exclude a small PFO.   Recent Labs: 03/08/2022: BUN 45; Creatinine, Ser 1.33; Hemoglobin 13.5; Platelets 195; Potassium 4.2; Sodium 139  Recent Lipid Panel    Component Value Date/Time   CHOL 143 02/07/2022 0932   TRIG 123 02/07/2022 0932    HDL 45 02/07/2022 0932   CHOLHDL 3.2 02/07/2022 0932   CHOLHDL 4 10/17/2021 1705   VLDL 22.2 10/17/2021 1705   LDLCALC 76 02/07/2022 0932     Risk Assessment/Calculations:      Physical Exam:    VS:  BP 130/78   Pulse 86   Ht '5\' 11"'$  (1.803 m)   Wt (!) 325 lb 9.6 oz (147.7 kg)   SpO2 94%   BMI 45.41 kg/m     Wt Readings from Last 3 Encounters:  10/28/22 (!) 325 lb 9.6 oz (147.7 kg)  08/01/22 (!) 323 lb (146.5 kg)  07/29/22 (!) 324 lb 6.4 oz (147.1 kg)     GEN: Well developed, obese gentleman in no acute distress HEENT: Normal NECK: No JVD; No carotid bruits CARDIAC: RRR, no murmurs, rubs, gallops RESPIRATORY:  Coarse breath sounds bilaterally with expiratory wheezing   ABDOMEN: Soft, non-tender, protuberant MUSCULOSKELETAL:  No edema; No deformity. 2+ pedal pulses, equal bilaterally SKIN: Warm and dry NEUROLOGIC:  Alert and oriented x 3 PSYCHIATRIC:  Normal affect   EKG:  EKG is not ordered today.   Diagnoses:    1. DOE (dyspnea on exertion)   2. Other fatigue   3. Essential hypertension   4. Chronic heart failure with preserved ejection fraction (HFpEF) (HCC)   5. Palpitations   6. Coronary artery disease involving other coronary artery bypass graft without angina pectoris   7. Hyperlipidemia LDL goal <70   8. Chronic cough   9. OSA (obstructive sleep apnea)   10. History of mitral valve prolapse     Assessment  and Plan:     Cough: Persistent cough and wheezing x 1 year. Initially thought it was residual of COVID infection 08/2021. We will try stopping his lisinopril to see if cough improves. Will start valsartan 320 mg after stopping lisinopril for a few days. We discussed possible referral to pulmonology if symptoms do not improve off ACEi.   Dyspnea on exertion/Chronic HFpEF: He continues to have DOE. Also very fatigued at end of work day. LVEF 65-70%, G1 DD, no obvious MVP on echo 11/2021. 1+ bilateral LE edema. Body habitus makes it difficult to assess  volume overload. No orthopnea, PND. No ischemia on lexiscan myoview 11/2021. Father had valve replacement and bypass surgery. Concern that this may be angina equivalent. Will get coronary CTA to define coronary anatomy.  Will have him take Lopressor 100 mg prior to CT. As noted above, consider referral to pulmonology. ADDENDUM: Coronary CTA revealed concerning stenosis in D2, LCx, and RCA.  I have talked with the patient and reviewed risks of cardiac catheterization and he agrees to proceed.  Fatigue: Having significant fatigue accompanied by DOE. Concern that this may be angina equivalent. Will get coronary CTA to evaluate coronary anatomy. Will get TSH to evaluate for thyroid dysfunction which may be contributing.   Hypertension: BP is well controlled. We are stopping lisinopril to see if it is contributing to cough. Start valsartan 320 mg daily and continue to monitor BP. Notify us if cough does not improve off ACEi.   Palpitations: Quiescent at this time. Consider addition of beta blocker if symptoms return or if BP is not well controlled on ARB.  Mitral valve prolapse: Echo 11/2021 with mitral valve leaflets elongated, no obvious prolapse, trivial MR. He is asymptomatic. Continue to monitor with repeat echo in 3-5 years, sooner if clinically indicated.   Hyperlipidemia: LDL 76 on 02/07/2022. We will recheck today. Await CT results for stricter LDL goal if calcification present. Continue rosuvastatin.  OSA: Mild by sleep test on 12/18/21. Seen by Dr. Radford Brock for sleep management with no recommendation to pursue PAP therapy at this time.   Morbid obesity: Admits to difficulty losing weight. We discussed the impact of obesity on his overall health. Continue to work on healthy diet and weight loss.    Disposition: 2 months with me   Shared Decision Making/Informed Consent The risks [stroke (1 in 1000), death (1 in 1000), kidney failure [usually temporary] (1 in 500), bleeding (1 in 200), allergic  reaction [possibly serious] (1 in 200)], benefits (diagnostic support and management of coronary artery disease) and alternatives of a cardiac catheterization were discussed in detail with Mr. Stapel and he is willing to proceed.   Medication Adjustments/Labs and Tests Ordered: Current medicines are reviewed at length with the patient today.  Concerns regarding medicines are outlined above.  Orders Placed This Encounter  Procedures   CT CORONARY MORPH W/CTA COR W/SCORE W/CA W/CM &/OR WO/CM   Comp Met (CMET)   Lipid Profile   Meds ordered this encounter  Medications   valsartan (DIOVAN) 320 MG tablet    Sig: Take 1 tablet (320 mg total) by mouth daily.    Dispense:  90 tablet    Refill:  3   metoprolol tartrate (LOPRESSOR) 100 MG tablet    Sig: Take one (1) tablet by mouth (100 mg) 2 hours prior to CT scan.    Dispense:  1 tablet    Refill:  0    Patient Instructions  Medication Instructions:   DISCONTINUE Lisinopril  START Valsartan one (1) tablet by mouth (320 mg) daily.   *If you need a refill on your cardiac medications before your next appointment, please call your pharmacy*   Lab Work:  TODAY LIPID/CMET  If you have labs (blood work) drawn today and your tests are completely normal, you will receive your results only by: [place] (if you have MyChart) OR A paper copy in the mail If you have any lab test that is abnormal or we need to change your treatment, we will call you to review the results.   Testing/Procedures:    Your cardiac CT will be scheduled at one of the below locations:   [facility] [address] [place], [place] [address] [phone]  If scheduled at [facility], please arrive at the [facility] and Children's Entrance (Entrance C2) of [facility] 30 minutes prior to test start time. You can use the FREE valet parking offered at entrance C (encouraged to control the heart rate for the test)  Proceed to the  [facility] Radiology Department (first floor) to check-in and test prep.  All radiology patients and guests should use entrance C2 at [place], accessed from [facility], even though the hospital's physical address listed is [address]    Please follow these instructions carefully (unless otherwise directed):  Hold all erectile dysfunction medications at least 3 days (72 hrs) prior to test. (Ie viagra, cialis, sildenafil, tadalafil, etc) We will administer nitroglycerin during this exam.   On the Night Before the Test: Be sure to Drink plenty of water. Do not consume any caffeinated/decaffeinated beverages or chocolate 12 hours prior to your test. Do not take any antihistamines 12 hours prior to your test.  On the Day of the Test: Drink plenty of water until 1 hour prior to the test. Do not eat any food 1 hour prior to test. You may take your regular medications prior to the test.  Take metoprolol (Lopressor) one tablet by mouth (100 mg) two hours prior to test. If you take Hydrochlorothiazide  please HOLD on the morning of the test.        After the Test: Drink plenty of water. After receiving IV contrast, you may experience a mild flushed feeling. This is normal. On occasion, you may experience a mild rash up to 24 hours after the test. This is not dangerous. If this occurs, you can take Benadryl 25 mg and increase your fluid intake. If you experience trouble breathing, this can be serious. If it is severe call 911 IMMEDIATELY. If it is mild, please call our office.  We will call to schedule your test 2-4 weeks out understanding that some insurance companies will need an authorization prior to the service being performed.   For non-scheduling related questions, please contact the cardiac imaging nurse navigator should you have any questions/concerns: Marchia Bond, Cardiac Imaging Nurse Navigator Gordy Clement, Cardiac Imaging Nurse  Navigator  Heart and Vascular Services Direct Office Dial: [phone]   For scheduling needs, including cancellations and rescheduling, please call [place], [phone].    Follow-Up: At [facility], you and your health needs are our priority.  As part of our continuing mission to provide you with exceptional heart care, we have created designated Provider Care Teams.  These Care Teams include your primary Cardiologist (physician) and Advanced Practice Providers (APPs -  Physician Assistants and Nurse Practitioners) who all work together to provide you with the care  you need, when you need it.  We recommend signing up for the patient portal called "MyChart".  Sign up information is provided on this After Visit Summary.  MyChart is used to connect with patients for Virtual Visits (Telemedicine).  Patients are able to view lab/test results, encounter notes, upcoming appointments, etc.  Non-urgent messages can be sent to your provider as well.   To learn more about what you can do with MyChart, go to [email or web address].    Your next appointment:   2 month(s)  Provider:   Christen Bame, NP            Signed, Emmaline Life, NP  10/28/2022 10:56 AM    [place]

## 2022-10-20 ENCOUNTER — Other Ambulatory Visit: Payer: Self-pay | Admitting: Family Medicine

## 2022-10-20 DIAGNOSIS — I10 Essential (primary) hypertension: Secondary | ICD-10-CM

## 2022-10-28 ENCOUNTER — Encounter: Payer: Self-pay | Admitting: Nurse Practitioner

## 2022-10-28 ENCOUNTER — Ambulatory Visit: Payer: Medicare Other | Attending: Nurse Practitioner | Admitting: Nurse Practitioner

## 2022-10-28 ENCOUNTER — Ambulatory Visit: Payer: Medicare Other

## 2022-10-28 VITALS — BP 130/78 | HR 86 | Ht 71.0 in | Wt 325.6 lb

## 2022-10-28 DIAGNOSIS — Z79899 Other long term (current) drug therapy: Secondary | ICD-10-CM | POA: Diagnosis present

## 2022-10-28 DIAGNOSIS — E785 Hyperlipidemia, unspecified: Secondary | ICD-10-CM

## 2022-10-28 DIAGNOSIS — Z8679 Personal history of other diseases of the circulatory system: Secondary | ICD-10-CM

## 2022-10-28 DIAGNOSIS — R0609 Other forms of dyspnea: Secondary | ICD-10-CM

## 2022-10-28 DIAGNOSIS — Z01818 Encounter for other preprocedural examination: Secondary | ICD-10-CM | POA: Diagnosis present

## 2022-10-28 DIAGNOSIS — I25810 Atherosclerosis of coronary artery bypass graft(s) without angina pectoris: Secondary | ICD-10-CM | POA: Diagnosis present

## 2022-10-28 DIAGNOSIS — I10 Essential (primary) hypertension: Secondary | ICD-10-CM

## 2022-10-28 DIAGNOSIS — G4733 Obstructive sleep apnea (adult) (pediatric): Secondary | ICD-10-CM

## 2022-10-28 DIAGNOSIS — R5383 Other fatigue: Secondary | ICD-10-CM | POA: Diagnosis present

## 2022-10-28 DIAGNOSIS — R0602 Shortness of breath: Secondary | ICD-10-CM | POA: Insufficient documentation

## 2022-10-28 DIAGNOSIS — R053 Chronic cough: Secondary | ICD-10-CM

## 2022-10-28 DIAGNOSIS — R002 Palpitations: Secondary | ICD-10-CM

## 2022-10-28 DIAGNOSIS — I5032 Chronic diastolic (congestive) heart failure: Secondary | ICD-10-CM

## 2022-10-28 LAB — LIPID PANEL
Chol/HDL Ratio: 3.8 ratio (ref 0.0–5.0)
Cholesterol, Total: 174 mg/dL (ref 100–199)
HDL: 46 mg/dL (ref >39)
LDL Chol Calc (NIH): 103 mg/dL — ABNORMAL HIGH (ref 0–99)
Triglycerides: 140 mg/dL (ref 0–149)
VLDL Cholesterol Cal: 25 mg/dL (ref 5–40)

## 2022-10-28 LAB — COMPREHENSIVE METABOLIC PANEL
ALT: 61 IU/L — ABNORMAL HIGH (ref 0–44)
AST: 47 IU/L — ABNORMAL HIGH (ref 0–40)
Albumin/Globulin Ratio: 2.1 (ref 1.2–2.2)
Albumin: 4.5 g/dL (ref 3.9–4.9)
Alkaline Phosphatase: 95 IU/L (ref 44–121)
BUN/Creatinine Ratio: 18 (ref 10–24)
BUN: 25 mg/dL (ref 8–27)
Bilirubin Total: 0.5 mg/dL (ref 0.0–1.2)
CO2: 22 mmol/L (ref 20–29)
Calcium: 10.1 mg/dL (ref 8.6–10.2)
Chloride: 98 mmol/L (ref 96–106)
Creatinine, Ser: 1.4 mg/dL — ABNORMAL HIGH (ref 0.76–1.27)
Globulin, Total: 2.1 g/dL (ref 1.5–4.5)
Glucose: 124 mg/dL — ABNORMAL HIGH (ref 70–99)
Potassium: 4.7 mmol/L (ref 3.5–5.2)
Sodium: 135 mmol/L (ref 134–144)
Total Protein: 6.6 g/dL (ref 6.0–8.5)
eGFR: 55 mL/min/{1.73_m2} — ABNORMAL LOW (ref >59)

## 2022-10-28 MED ORDER — VALSARTAN 320 MG PO TABS: 320.0000 mg | ORAL_TABLET | Freq: Every day | ORAL | 3 refills | Status: DC

## 2022-10-28 MED ORDER — METOPROLOL TARTRATE 100 MG PO TABS: ORAL_TABLET | ORAL | 0 refills | Status: DC

## 2022-10-28 NOTE — Patient Instructions (Signed)
Medication Instructions:   DISCONTINUE Lisinopril  START Valsartan one (1) tablet by mouth (320 mg) daily.   *If you need a refill on your cardiac medications before your next appointment, please call your pharmacy*   Lab Work:  TODAY LIPID/CMET  If you have labs (blood work) drawn today and your tests are completely normal, you will receive your results only by: [place] (if you have MyChart) OR A paper copy in the mail If you have any lab test that is abnormal or we need to change your treatment, we will call you to review the results.   Testing/Procedures:    Your cardiac CT will be scheduled at one of the below locations:   [facility] [address] [place],  [address] [phone]  If scheduled at [facility], please arrive at the [facility] and Children's Entrance (Entrance C2) of [facility] 30 minutes prior to test start time. You can use the FREE valet parking offered at entrance C (encouraged to control the heart rate for the test)  Proceed to the [facility] Radiology Department (first floor) to check-in and test prep.  All radiology patients and guests should use entrance C2 at [facility], accessed from [facility], even though the hospital's physical address listed is [address].    Please follow these instructions carefully (unless otherwise directed):  Hold all erectile dysfunction medications at least 3 days (72 hrs) prior to test. (Ie viagra, cialis, sildenafil, tadalafil, etc) We will administer nitroglycerin during this exam.   On the Night Before the Test: Be sure to Drink plenty of water. Do not consume any caffeinated/decaffeinated beverages or chocolate 12 hours prior to your test. Do not take any antihistamines 12 hours prior to your test.  On the Day of the Test: Drink plenty of water until 1 hour prior to the test. Do not eat any food 1 hour prior to test. You may take  your regular medications prior to the test.  Take metoprolol (Lopressor) one tablet by mouth (100 mg) two hours prior to test. If you take Hydrochlorothiazide  please HOLD on the morning of the test.        After the Test: Drink plenty of water. After receiving IV contrast, you may experience a mild flushed feeling. This is normal. On occasion, you may experience a mild rash up to 24 hours after the test. This is not dangerous. If this occurs, you can take Benadryl 25 mg and increase your fluid intake. If you experience trouble breathing, this can be serious. If it is severe call 911 IMMEDIATELY. If it is mild, please call our office.  We will call to schedule your test 2-4 weeks out understanding that some insurance companies will need an authorization prior to the service being performed.   For non-scheduling related questions, please contact the cardiac imaging nurse navigator should you have any questions/concerns: Marchia Bond, Cardiac Imaging Nurse Navigator Gordy Clement, Cardiac Imaging Nurse Navigator [facility] Direct Office Dial: [phone]   For scheduling needs, including cancellations and rescheduling, please call [place], [phone].    Follow-Up: At [facility], you and your health needs are our priority.  As part of our continuing mission to provide you with exceptional heart care, we have created designated Provider Care Teams.  These Care Teams include your primary Cardiologist (physician) and Advanced Practice Providers (APPs -  Physician Assistants and Nurse Practitioners) who all work together  to provide you with the care you need, when you need it.  We recommend signing up for the patient portal called "MyChart".  Sign up information is provided on this After Visit Summary.  MyChart is used to connect with patients for Virtual Visits (Telemedicine).  Patients are able to view lab/test results, encounter notes, upcoming  appointments, etc.  Non-urgent messages can be sent to your provider as well.   To learn more about what you can do with MyChart, go to [email or web address].    Your next appointment:   2 month(s)  Provider:   Christen Bame, NP

## 2022-10-29 ENCOUNTER — Other Ambulatory Visit: Payer: Self-pay | Admitting: *Deleted

## 2022-10-29 DIAGNOSIS — Z01818 Encounter for other preprocedural examination: Secondary | ICD-10-CM

## 2022-10-29 DIAGNOSIS — Z79899 Other long term (current) drug therapy: Secondary | ICD-10-CM

## 2022-10-29 DIAGNOSIS — G4733 Obstructive sleep apnea (adult) (pediatric): Secondary | ICD-10-CM

## 2022-10-29 DIAGNOSIS — R0602 Shortness of breath: Secondary | ICD-10-CM

## 2022-10-29 DIAGNOSIS — I10 Essential (primary) hypertension: Secondary | ICD-10-CM

## 2022-10-29 DIAGNOSIS — Z8679 Personal history of other diseases of the circulatory system: Secondary | ICD-10-CM

## 2022-10-31 ENCOUNTER — Ambulatory Visit (INDEPENDENT_AMBULATORY_CARE_PROVIDER_SITE_OTHER): Payer: Medicare Other | Admitting: Family Medicine

## 2022-10-31 ENCOUNTER — Encounter: Payer: Self-pay | Admitting: Family Medicine

## 2022-10-31 VITALS — BP 136/78 | HR 94 | Temp 97.7°F | Ht 71.0 in | Wt 324.8 lb

## 2022-10-31 DIAGNOSIS — I10 Essential (primary) hypertension: Secondary | ICD-10-CM

## 2022-10-31 DIAGNOSIS — E785 Hyperlipidemia, unspecified: Secondary | ICD-10-CM

## 2022-10-31 DIAGNOSIS — Z6841 Body Mass Index (BMI) 40.0 and over, adult: Secondary | ICD-10-CM

## 2022-10-31 NOTE — Assessment & Plan Note (Signed)
Currently, his rosuvastatin is on hold. CAC scan pending.

## 2022-10-31 NOTE — Assessment & Plan Note (Addendum)
Mr. Langston blood pressure is mildly high today. However, he only recently made the change to valsartan. I will have him continue valsartan 320 mg daily, HCTZ 25 mg daily, and amlodipine 5 mg daily.

## 2022-10-31 NOTE — Assessment & Plan Note (Signed)
I support the efforts that Bryan Brock is making to improve his health and achieve weight loss. We will continue to follow this.

## 2022-10-31 NOTE — Progress Notes (Signed)
[facility] [address] [place] [address] Dept: [phone] Dept Fax: [phone]  Chronic Care Office Visit  Subjective:    Patient ID: Bryan Brock, male    DOB: 1956-07-02, 67 y.o..   MRN: [id number]  Chief Complaint  Patient presents with   Medical Management of Chronic Issues    3 month f/u.     History of Present Illness:  Patient is in today for reassessment of chronic medical issues.  Bryan Brock has a history of hyperlipidemia. He is managed on rosuvastatin 10 mg daily and omega-3 fatty acid supplement. He recently saw cardiology. They are having him hold his statin for now. They plan to check a CAC scan and reassess need for the statin.   Bryan Brock has a history hypertension. He has been managed on lisinopril 40 mg daily, HCTZ 25 mg daily, and amlodipine 5 mg daily. Cardiology is concerned that Mr. [name] chronic cough may be secondary to the lisinopril. They have switch this to valsartan 320 mg daily. He feels he is tolerating this recent change.   Bryan Brock continues to feel an issue with dyspnea on exertion. He feels this may be more of a deconditioning issue and related to his central obesity. He does espouse plans for increasing physical activity and continue with recent improvements in his diet.  Past Medical History: Patient Active Problem List   Diagnosis Date Noted   OSA (obstructive sleep apnea) 12/19/2021   Essential hypertension 10/29/2021   Elevated transaminase level 10/18/2021   Prediabetes 10/17/2021   Morbid obesity with BMI of 40.0-44.9, adult ([place]) 10/17/2021   Bipolar 1 disorder, depressed ([place]) 05/11/2020   History of basal cell carcinoma 11/05/2007   Borderline hyperlipidemia 11/05/2007   Premature ventricular contractions 11/05/2007   History of mitral valve prolapse 11/05/2007   Past Surgical History:  Procedure Laterality Date   HYDROCELE EXCISION Bilateral 03/22/2022    Procedure: HYDROCELECTOMY ADULT;  Surgeon: Robley Fries, MD;  Location: [facility];  Service: Urology;  Laterality: Bilateral;  2 HRS   MOHS SURGERY  1990   STRABISMUS SURGERY Left    TONSILLECTOMY AND ADENOIDECTOMY     WISDOM TOOTH EXTRACTION     Family History  Problem Relation Age of Onset   Heart disease Mother    Diabetes Mother    Obesity Mother    Hyperlipidemia Mother    Cancer Father        skin cancer   Heart disease Father    Hyperlipidemia Father    Colon cancer Neg Hx    Esophageal cancer Neg Hx    Rectal cancer Neg Hx    Stomach cancer Neg Hx    Outpatient Medications Prior to Visit  Medication Sig Dispense Refill   albuterol (VENTOLIN HFA) 108 (90 Base) MCG/ACT inhaler SMARTSIG:1 Puff(s) By Mouth Every 6 Hours PRN     amLODipine (NORVASC) 5 MG tablet Take 1 tablet (5 mg total) by mouth daily. (Patient taking differently: Take 5 mg by mouth at bedtime.) 90 tablet 3   APPLE CIDER VINEGAR PO Take 15 mLs by mouth every Monday, Wednesday, and Friday.     Ascorbic Acid (VITAMIN C) 1000 MG tablet Take 3,000 mg by mouth daily.     calcium elemental as carbonate (TUMS ULTRA 1000) 400 MG chewable tablet Chew 2,000 mg by mouth 3 (three) times daily as needed for heartburn.     Cholecalciferol (DIALYVITE VITAMIN D 5000) 125 MCG (5000 UT) capsule  Take 20,000 Units by mouth daily.     Coenzyme Q10 (COQ-10) 100 MG CAPS Take 100 mg by mouth daily.     Flaxseed, Linseed, (FLAX SEED OIL) 1000 MG CAPS Take 1,000 mg by mouth daily.     folic acid (FOLVITE) Q000111Q MCG tablet Take 800 mcg by mouth daily.     hydrochlorothiazide (HYDRODIURIL) 25 MG tablet Take 1 tablet (25 mg total) by mouth daily. 90 tablet 2   lamoTRIgine (LAMICTAL) 100 MG tablet Take 100 mg by mouth daily.     Liniments (BLUE-EMU SUPER STRENGTH EX) Apply 1 Application topically daily as needed (pain).     magnesium oxide (MAG-OX) 400 MG tablet Take 400 mg by mouth daily.     metoprolol tartrate (LOPRESSOR) 100 MG tablet  Take one (1) tablet by mouth (100 mg) 2 hours prior to CT scan. 1 tablet 0   Multiple Vitamins-Minerals (MULTIVITAMIN WITH MINERALS) tablet Take 1 tablet by mouth daily.     naproxen sodium (ALEVE) 220 MG tablet Take 220 mg by mouth See admin instructions. Take 220 mg in the morning, may take a second 220 mg dose as needed for pain     Omega 3 1000 MG CAPS Take 1,000 mg by mouth daily.     OVER THE COUNTER MEDICATION Nano CBD OIL: Pt takes 20 drops in the morning and 20 drops in the evening.     sildenafil (VIAGRA) 50 MG tablet Take 50 mg by mouth daily as needed for erectile dysfunction.     valsartan (DIOVAN) 320 MG tablet Take 1 tablet (320 mg total) by mouth daily. 90 tablet 3   zinc gluconate 50 MG tablet Take 50 mg by mouth daily.     No facility-administered medications prior to visit.   Allergies  Allergen Reactions   Latex Dermatitis   Objective:   Today's Vitals   10/31/22 0944  BP: 136/78  Pulse: 94  Temp: 97.7 F (36.5 C)  TempSrc: Temporal  SpO2: 98%  Weight: (!) 324 lb 12.8 oz (147.3 kg)  Height: '5\' 11"'$  (1.803 m)   Body mass index is 45.3 kg/m.   General: Well developed, well nourished. No acute distress. Psych: Alert and oriented. Normal mood and affect.  Health Maintenance Due  Topic Date Due   Hepatitis C Screening  Never done   Zoster Vaccines- Shingrix (1 of 2) Never done     Assessment & Plan:   Problem List Items Addressed This Visit       Cardiovascular and Mediastinum   Essential hypertension - Primary    Bryan Brock blood pressure is mildly high today. However, he only recently made the change to valsartan. I will have him continue valsartan 320 mg daily, HCTZ 25 mg daily, and amlodipine 5 mg daily.        Other   Borderline hyperlipidemia    Currently, his rosuvastatin is on hold. CAC scan pending.      Morbid obesity with BMI of 40.0-44.9, adult ([name])    I support the efforts that Bryan Brock is making to improve his health and achieve  weight loss. We will continue to follow this.       Return in about 3 months (around 01/31/2023) for Reassessment.   Haydee Salter, MD

## 2022-11-06 ENCOUNTER — Telehealth (HOSPITAL_COMMUNITY): Payer: Self-pay | Admitting: *Deleted

## 2022-11-06 NOTE — Telephone Encounter (Signed)
Reaching out to patient to offer assistance regarding upcoming cardiac imaging study; pt verbalizes understanding of appt date/time, parking situation and where to check in, pre-test NPO status and medications ordered, and verified current allergies; name and call back number provided for further questions should they arise  Addalynne Golding RN Navigator Cardiac Imaging [facility] [phone] office [phone] cell  Patient to take 100mg metoprolol tartrate two hours prior to his cardiac CT scan. He is aware to arrive at 9:30am. 

## 2022-11-06 NOTE — Telephone Encounter (Signed)
Attempted to call patient regarding upcoming cardiac CT appointment. °Left message on voicemail with name and callback number ° °Britten Seyfried RN Navigator Cardiac Imaging °[facility] °[phone] Office °[phone] Cell ° °

## 2022-11-07 ENCOUNTER — Other Ambulatory Visit: Payer: Self-pay | Admitting: Cardiology

## 2022-11-07 ENCOUNTER — Ambulatory Visit (HOSPITAL_BASED_OUTPATIENT_CLINIC_OR_DEPARTMENT_OTHER)
Admission: RE | Admit: 2022-11-07 | Discharge: 2022-11-07 | Disposition: A | Discharge status: Home / Self Care | Payer: Medicare Other | Source: Ambulatory Visit | Attending: Cardiology | Admitting: Cardiology

## 2022-11-07 ENCOUNTER — Ambulatory Visit (HOSPITAL_COMMUNITY)
Admission: RE | Admit: 2022-11-07 | Discharge: 2022-11-07 | Disposition: A | Discharge status: Home / Self Care | Payer: Medicare Other | Source: Ambulatory Visit | Attending: Nurse Practitioner | Admitting: Nurse Practitioner

## 2022-11-07 DIAGNOSIS — I2510 Atherosclerotic heart disease of native coronary artery without angina pectoris: Secondary | ICD-10-CM

## 2022-11-07 DIAGNOSIS — R931 Abnormal findings on diagnostic imaging of heart and coronary circulation: Secondary | ICD-10-CM | POA: Insufficient documentation

## 2022-11-07 DIAGNOSIS — I10 Essential (primary) hypertension: Secondary | ICD-10-CM | POA: Insufficient documentation

## 2022-11-07 DIAGNOSIS — R0609 Other forms of dyspnea: Secondary | ICD-10-CM | POA: Diagnosis present

## 2022-11-07 DIAGNOSIS — I25810 Atherosclerosis of coronary artery bypass graft(s) without angina pectoris: Secondary | ICD-10-CM | POA: Diagnosis not present

## 2022-11-07 DIAGNOSIS — I5032 Chronic diastolic (congestive) heart failure: Secondary | ICD-10-CM | POA: Insufficient documentation

## 2022-11-07 DIAGNOSIS — R5383 Other fatigue: Secondary | ICD-10-CM | POA: Diagnosis present

## 2022-11-07 MED ORDER — NITROGLYCERIN 0.4 MG SL SUBL
0.8000 mg | SUBLINGUAL_TABLET | Freq: Once | SUBLINGUAL | Status: AC
  Administered 2022-11-07: 0.8 mg via SUBLINGUAL

## 2022-11-07 MED ORDER — NITROGLYCERIN 0.4 MG SL SUBL
SUBLINGUAL_TABLET | SUBLINGUAL | Status: AC
  Filled 2022-11-07: qty 2

## 2022-11-07 MED ORDER — IOHEXOL 350 MG/ML SOLN
100.0000 mL | Freq: Once | INTRAVENOUS | Status: AC | PRN
  Administered 2022-11-07: 100 mL via INTRAVENOUS

## 2022-11-07 NOTE — Progress Notes (Signed)
FFR Order 

## 2022-11-11 ENCOUNTER — Telehealth: Payer: Self-pay | Admitting: *Deleted

## 2022-11-11 DIAGNOSIS — Z79899 Other long term (current) drug therapy: Secondary | ICD-10-CM

## 2022-11-11 MED ORDER — ASPIRIN 81 MG PO TBEC: 81.0000 mg | DELAYED_RELEASE_TABLET | Freq: Every day | ORAL | 12 refills | Status: DC

## 2022-11-11 NOTE — Addendum Note (Signed)
Addended by: Emmaline Life on: 11/11/2022 01:27 PM   Modules accepted: Orders

## 2022-11-11 NOTE — Telephone Encounter (Signed)
Pt has been made aware that he has been scheduled for a heart cath.  Will print instructions for pt to pick up when he comes in for his labs 11/14/22.          Cardiac/Peripheral Catheterization   You are scheduled for a Cardiac Catheterization on Thursday, March 28 with Dr. Lenna Sciara.  1. Please arrive at the Main Entrance A at [facility]: [place], [place] [address] on March 28 at 8:30 AM (This time is two hours before your procedure to ensure your preparation). Free valet parking service is available. You will check in at ADMITTING. The support person will be asked to wait in the waiting room.  It is OK to have someone drop you off and come back when you are ready to be discharged.        Special note: Every effort is made to have your procedure done on time. Please understand that emergencies sometimes delay scheduled procedures.   . 2. Diet: Do not eat solid foods after midnight.  You may have clear liquids until 5 AM the day of the procedure.  3. Labs: You will need to have blood drawn on Thursday, March 21 at [facility]. [address]  Open: 7:30am - 5pm    Phone: [phone]. You do not need to be fasting.  4. Medication instructions in preparation for your procedure:   Contrast Allergy: No  YOU WILL NEED TO START TAKING A BABY ASPIRIN, 91 MG EVERYDAY  HOLD HYDROCHLOROTHIAZIDE THE MORNING OF YOUR PROCEDURE  On the morning of your procedure, take Aspirin 81 mg and any morning medicines NOT listed above.  You may use sips of water.  5. Plan to go home the same day, you will only stay overnight if medically necessary. 6. You MUST have a responsible adult to drive you home. 7. An adult MUST be with you the first 24 hours after you arrive home. 8. Bring a current list of your medications, and the last time and date medication taken. 9. Bring ID and current insurance cards. 10.Please wear clothes  that are easy to get on and off and wear slip-on shoes.  Thank you for allowing [place] to care for you!   -- [facility]

## 2022-11-12 ENCOUNTER — Ambulatory Visit: Payer: Medicare Other

## 2022-11-14 ENCOUNTER — Ambulatory Visit: Payer: Medicare Other | Attending: Nurse Practitioner

## 2022-11-14 DIAGNOSIS — Z79899 Other long term (current) drug therapy: Secondary | ICD-10-CM

## 2022-11-14 LAB — CBC
Hematocrit: 41.8 % (ref 37.5–51.0)
Hemoglobin: 14 g/dL (ref 13.0–17.7)
MCH: 31.1 pg (ref 26.6–33.0)
MCHC: 33.5 g/dL (ref 31.5–35.7)
MCV: 93 fL (ref 79–97)
Platelets: 176 10*3/uL (ref 150–450)
RBC: 4.5 x10E6/uL (ref 4.14–5.80)
RDW: 14.8 % (ref 11.6–15.4)
WBC: 5.1 10*3/uL (ref 3.4–10.8)

## 2022-11-14 LAB — COMPREHENSIVE METABOLIC PANEL
ALT: 58 IU/L — ABNORMAL HIGH (ref 0–44)
AST: 42 IU/L — ABNORMAL HIGH (ref 0–40)
Albumin/Globulin Ratio: 2.2 (ref 1.2–2.2)
Albumin: 4.3 g/dL (ref 3.9–4.9)
Alkaline Phosphatase: 97 IU/L (ref 44–121)
BUN/Creatinine Ratio: 21 (ref 10–24)
BUN: 32 mg/dL — ABNORMAL HIGH (ref 8–27)
Bilirubin Total: 0.5 mg/dL (ref 0.0–1.2)
CO2: 26 mmol/L (ref 20–29)
Calcium: 9.6 mg/dL (ref 8.6–10.2)
Chloride: 100 mmol/L (ref 96–106)
Creatinine, Ser: 1.52 mg/dL — ABNORMAL HIGH (ref 0.76–1.27)
Globulin, Total: 2 g/dL (ref 1.5–4.5)
Glucose: 134 mg/dL — ABNORMAL HIGH (ref 70–99)
Potassium: 4.4 mmol/L (ref 3.5–5.2)
Sodium: 137 mmol/L (ref 134–144)
Total Protein: 6.3 g/dL (ref 6.0–8.5)
eGFR: 50 mL/min/{1.73_m2} — ABNORMAL LOW (ref >59)

## 2022-11-19 ENCOUNTER — Telehealth: Payer: Self-pay | Admitting: *Deleted

## 2022-11-19 NOTE — Telephone Encounter (Addendum)
Cardiac Catheterization scheduled at [facility] for: Thursday November 21, 2022 10:30 AM Arrival time [place] Entrance A at: 8:30 AM  Nothing to eat after midnight prior to procedure, clear liquids until 5 AM day of procedure.  Medication instructions: -Hold:  Valsartan/HCTZ-day before and day of procedure-per protocol GFR-50 -Other usual morning medications can be taken with sips of water including aspirin 81 mg.  Confirmed patient has responsible adult to drive home post procedure and be with patient first 24 hours after arriving home.  Plan to go home the same day, you will only stay overnight if medically necessary.  Reviewed procedure instructions with patient.

## 2022-11-21 ENCOUNTER — Ambulatory Visit (HOSPITAL_BASED_OUTPATIENT_CLINIC_OR_DEPARTMENT_OTHER): Payer: Medicare Other

## 2022-11-21 ENCOUNTER — Other Ambulatory Visit: Payer: Self-pay

## 2022-11-21 ENCOUNTER — Ambulatory Visit (HOSPITAL_COMMUNITY)
Admission: RE | Admit: 2022-11-21 | Discharge: 2022-11-21 | Disposition: A | Discharge status: Home / Self Care | Payer: Medicare Other | Attending: Internal Medicine | Admitting: Internal Medicine

## 2022-11-21 ENCOUNTER — Encounter (HOSPITAL_COMMUNITY)
Admission: RE | Disposition: A | Discharge status: Home / Self Care | Payer: Self-pay | Source: Home / Self Care | Attending: Internal Medicine

## 2022-11-21 DIAGNOSIS — Z8679 Personal history of other diseases of the circulatory system: Secondary | ICD-10-CM | POA: Diagnosis not present

## 2022-11-21 DIAGNOSIS — Z6841 Body Mass Index (BMI) 40.0 and over, adult: Secondary | ICD-10-CM | POA: Diagnosis not present

## 2022-11-21 DIAGNOSIS — G4733 Obstructive sleep apnea (adult) (pediatric): Secondary | ICD-10-CM | POA: Diagnosis not present

## 2022-11-21 DIAGNOSIS — F17220 Nicotine dependence, chewing tobacco, uncomplicated: Secondary | ICD-10-CM | POA: Insufficient documentation

## 2022-11-21 DIAGNOSIS — I25118 Atherosclerotic heart disease of native coronary artery with other forms of angina pectoris: Secondary | ICD-10-CM | POA: Insufficient documentation

## 2022-11-21 DIAGNOSIS — I5030 Unspecified diastolic (congestive) heart failure: Secondary | ICD-10-CM | POA: Diagnosis not present

## 2022-11-21 DIAGNOSIS — E785 Hyperlipidemia, unspecified: Secondary | ICD-10-CM | POA: Diagnosis not present

## 2022-11-21 DIAGNOSIS — I110 Hypertensive heart disease with heart failure: Secondary | ICD-10-CM | POA: Insufficient documentation

## 2022-11-21 DIAGNOSIS — I25810 Atherosclerosis of coronary artery bypass graft(s) without angina pectoris: Secondary | ICD-10-CM

## 2022-11-21 DIAGNOSIS — R0609 Other forms of dyspnea: Secondary | ICD-10-CM | POA: Insufficient documentation

## 2022-11-21 DIAGNOSIS — Z8249 Family history of ischemic heart disease and other diseases of the circulatory system: Secondary | ICD-10-CM | POA: Diagnosis not present

## 2022-11-21 DIAGNOSIS — Z8616 Personal history of COVID-19: Secondary | ICD-10-CM | POA: Insufficient documentation

## 2022-11-21 DIAGNOSIS — I5032 Chronic diastolic (congestive) heart failure: Secondary | ICD-10-CM | POA: Insufficient documentation

## 2022-11-21 HISTORY — PX: LEFT HEART CATH AND CORONARY ANGIOGRAPHY: CATH118249

## 2022-11-21 HISTORY — PX: CORONARY PRESSURE/FFR STUDY: CATH118243

## 2022-11-21 LAB — ECHOCARDIOGRAM COMPLETE
AR max vel: 3.25 cm2
AV Area VTI: 3 cm2
AV Area mean vel: 3.23 cm2
AV Mean grad: 3 mmHg
AV Peak grad: 4.9 mmHg
Ao pk vel: 1.11 m/s
Area-P 1/2: 2.46 cm2
Height: 71 in
S' Lateral: 3.2 cm
Weight: 5200 oz

## 2022-11-21 LAB — POCT ACTIVATED CLOTTING TIME: Activated Clotting Time: 282 seconds

## 2022-11-21 SURGERY — LEFT HEART CATH AND CORONARY ANGIOGRAPHY
Anesthesia: LOCAL

## 2022-11-21 MED ORDER — LIDOCAINE HCL (PF) 1 % IJ SOLN
INTRAMUSCULAR | Status: AC
  Filled 2022-11-21: qty 30

## 2022-11-21 MED ORDER — ONDANSETRON HCL 4 MG/2ML IJ SOLN: 4.0000 mg | Freq: Four times a day (QID) | INTRAMUSCULAR | Status: DC | PRN

## 2022-11-21 MED ORDER — LIDOCAINE HCL (PF) 1 % IJ SOLN
INTRAMUSCULAR | Status: DC | PRN
  Administered 2022-11-21: 2 mL via INTRADERMAL

## 2022-11-21 MED ORDER — MIDAZOLAM HCL 2 MG/2ML IJ SOLN
INTRAMUSCULAR | Status: AC
  Filled 2022-11-21: qty 2

## 2022-11-21 MED ORDER — HEPARIN (PORCINE) IN NACL 1000-0.9 UT/500ML-% IV SOLN
INTRAVENOUS | Status: DC | PRN
  Administered 2022-11-21 (×2): 500 mL

## 2022-11-21 MED ORDER — HEPARIN SODIUM (PORCINE) 1000 UNIT/ML IJ SOLN
INTRAMUSCULAR | Status: AC
  Filled 2022-11-21: qty 10

## 2022-11-21 MED ORDER — SODIUM CHLORIDE 0.9 % WEIGHT BASED INFUSION
3.0000 mL/kg/h | INTRAVENOUS | Status: AC
  Administered 2022-11-21: 3 mL/kg/h via INTRAVENOUS

## 2022-11-21 MED ORDER — SODIUM CHLORIDE 0.9 % WEIGHT BASED INFUSION: 1.0000 mL/kg/h | INTRAVENOUS | Status: DC

## 2022-11-21 MED ORDER — MIDAZOLAM HCL 2 MG/2ML IJ SOLN
INTRAMUSCULAR | Status: DC | PRN
  Administered 2022-11-21 (×2): 1 mg via INTRAVENOUS

## 2022-11-21 MED ORDER — VERAPAMIL HCL 2.5 MG/ML IV SOLN
INTRAVENOUS | Status: AC
  Filled 2022-11-21: qty 2

## 2022-11-21 MED ORDER — FENTANYL CITRATE (PF) 100 MCG/2ML IJ SOLN
INTRAMUSCULAR | Status: AC
  Filled 2022-11-21: qty 2

## 2022-11-21 MED ORDER — SODIUM CHLORIDE 0.9% FLUSH: 3.0000 mL | INTRAVENOUS | Status: DC | PRN

## 2022-11-21 MED ORDER — HEPARIN SODIUM (PORCINE) 1000 UNIT/ML IJ SOLN
INTRAMUSCULAR | Status: DC | PRN
  Administered 2022-11-21: 10000 [IU] via INTRAVENOUS
  Administered 2022-11-21: 5000 [IU] via INTRAVENOUS

## 2022-11-21 MED ORDER — IOHEXOL 350 MG/ML SOLN
INTRAVENOUS | Status: DC | PRN
  Administered 2022-11-21: 70 mL

## 2022-11-21 MED ORDER — VERAPAMIL HCL 2.5 MG/ML IV SOLN
INTRAVENOUS | Status: DC | PRN
  Administered 2022-11-21: 15 mL via INTRA_ARTERIAL

## 2022-11-21 MED ORDER — HYDRALAZINE HCL 20 MG/ML IJ SOLN: 10.0000 mg | INTRAMUSCULAR | Status: DC | PRN

## 2022-11-21 MED ORDER — ASPIRIN 81 MG PO CHEW: 81.0000 mg | CHEWABLE_TABLET | ORAL | Status: DC

## 2022-11-21 MED ORDER — SODIUM CHLORIDE 0.9 % IV SOLN: 250.0000 mL | INTRAVENOUS | Status: DC | PRN

## 2022-11-21 MED ORDER — FENTANYL CITRATE (PF) 100 MCG/2ML IJ SOLN
INTRAMUSCULAR | Status: DC | PRN
  Administered 2022-11-21 (×2): 50 ug via INTRAVENOUS

## 2022-11-21 MED ORDER — SODIUM CHLORIDE 0.9 % IV SOLN: INTRAVENOUS | Status: DC

## 2022-11-21 MED ORDER — SODIUM CHLORIDE 0.9% FLUSH: 3.0000 mL | Freq: Two times a day (BID) | INTRAVENOUS | Status: DC

## 2022-11-21 MED ORDER — LABETALOL HCL 5 MG/ML IV SOLN: 10.0000 mg | INTRAVENOUS | Status: DC | PRN

## 2022-11-21 MED ORDER — ACETAMINOPHEN 325 MG PO TABS: 650.0000 mg | ORAL_TABLET | ORAL | Status: DC | PRN

## 2022-11-21 SURGICAL SUPPLY — 11 items
CATH INFINITI 6F ANG MULTIPACK (CATHETERS) IMPLANT
CATH LAUNCHER 6FR EBU3.5 (CATHETERS) IMPLANT
CATH OPTITORQUE TIG 4.0 6F (CATHETERS) IMPLANT
GLIDESHEATH SLEND SS 6F .021 (SHEATH) IMPLANT
GUIDEWIRE PRESSURE X 175 (WIRE) IMPLANT
KIT HEART LEFT (KITS) ×1 IMPLANT
PACK CARDIAC CATHETERIZATION (CUSTOM PROCEDURE TRAY) ×1 IMPLANT
SYR MEDRAD MARK 7 150ML (SYRINGE) ×1 IMPLANT
TRANSDUCER W/STOPCOCK (MISCELLANEOUS) ×1 IMPLANT
TUBING CIL FLEX 10 FLL-RA (TUBING) ×1 IMPLANT
WIRE EMERALD 3MM-J .035X260CM (WIRE) IMPLANT

## 2022-11-21 NOTE — Interval H&P Note (Signed)
History and Physical Interval Note:  11/21/2022 8:34 AM  Bryan Brock  has presented today for surgery, with the diagnosis of abnormal ct - cad.  The various methods of treatment have been discussed with the patient and family. After consideration of risks, benefits and other options for treatment, the patient has consented to  Procedure(s): LEFT HEART CATH AND CORONARY ANGIOGRAPHY (N/A) as a surgical intervention.  The patient's history has been reviewed, patient examined, no change in status, stable for surgery.  I have reviewed the patient's chart and labs.  Questions were answered to the patient's satisfaction.     [name]

## 2022-11-21 NOTE — Discharge Instructions (Signed)

## 2022-11-21 NOTE — Progress Notes (Signed)
Patient and wife was given discharge instructions. Both verbalized understanding. 

## 2022-11-22 ENCOUNTER — Encounter (HOSPITAL_COMMUNITY): Payer: Self-pay | Admitting: Internal Medicine

## 2022-11-27 ENCOUNTER — Telehealth: Payer: Self-pay | Admitting: Cardiology

## 2022-11-27 ENCOUNTER — Encounter: Payer: Self-pay | Admitting: *Deleted

## 2022-11-27 NOTE — Telephone Encounter (Signed)
Patient requesting letter stating safe to work on light duty (which employer has already reduced him to) at part time job at [employer] until day of open heart surgery - date to be determined after consult on 12/18/22.  He said he was not given restrictions at discharge from cath.  He is confident he can perform the light duty role.   Will email the letter once okay w Dr. Johney Frame or Dr. Ali [place].

## 2022-11-27 NOTE — Telephone Encounter (Signed)
Pt requesting a return to work letter with light duty up until the time of his open heart surgery. Pt states he had a Heart Cath done and his employer is request a note. Pt also requesting that note be emailed if possible due to the drive time to get to office. He would like a callback regarding this matter as soon as possible. Please advise

## 2022-11-27 NOTE — Telephone Encounter (Signed)
Bryan Bergeron, MD  You; [name], MD1 hour ago (11:39 AM)  HP That's okay with me!  _____________________________________________________________________  Letter sent to patient portal. Will also sent secure to his email per patient request

## 2022-11-27 NOTE — Telephone Encounter (Signed)
Patient states he needs a light duty letter for work until he has surgery. His job is requesting it as soon as possible. He would like it emailed to [email or web address]

## 2022-12-10 ENCOUNTER — Ambulatory Visit: Payer: Medicare Other | Admitting: Nurse Practitioner

## 2022-12-10 ENCOUNTER — Ambulatory Visit: Payer: Medicare Other | Attending: Interventional Cardiology | Admitting: Pharmacist

## 2022-12-10 DIAGNOSIS — E785 Hyperlipidemia, unspecified: Secondary | ICD-10-CM | POA: Diagnosis present

## 2022-12-10 MED ORDER — ROSUVASTATIN CALCIUM 10 MG PO TABS: 10.0000 mg | ORAL_TABLET | Freq: Every day | ORAL | 3 refills | Status: DC

## 2022-12-10 NOTE — Assessment & Plan Note (Signed)
Assessment: LDL-C is above goal of less than 55.  At last check it was 103 Upon review his AST and ALT were elevated prior to starting statin therapy.  Still very mild elevation. AST was 43 and ALT was 69 I think is mild LFT elevation is due to his fatty liver and or lamotrigine.  More likely fatty liver Okay to continue with statin therapy.  But given his LDL of 103 he will need more aggressive therapy. Patient cannot afford to go into the donut hole therefore branded medications like PCSK9 or Nexlizet would be a challenge for patient to afford.  However patient does have a Medicare supplement plan which would cover Leqvio fully.  We did discuss how the cardiovascular outcomes trial has not completed for this medication but we do suspect to see a reduction in MI and stroke.  Reviewed its LDL-C reduction and dosing schedule.  Patient has signed [facility] service center start form.  I will submit this and then refer patient to [facility] infusion center.  Plan Resume rosuvastatin 10 mg daily Start Leqvio injection at 0,3 months and then every 6 months thereafter.  Will complete start form and once benefits confirmed will refer to [facility] [employer] infusion center Continue to make improvements in diet Once CABG is complete patient should participate in cardiac rehab and begin to increase his exercise Will need follow-up labs in about 3 months

## 2022-12-10 NOTE — Progress Notes (Signed)
Patient ID: Bryan Brock                 DOB: Feb 07, 1956                    MRN: [id number]      HPI: Bryan Brock is a 67 y.o. male patient of Dr. Devin Going referred to lipid clinic by Eligha Bridegroom. PMH is significant for  MVP, symptomatic PVCs, hypertension, hyperlipidemia, family hx CAD, and morbid obesity. Paitent underwent coronary CTA which revealed stenosis in D2, LCx and RCA. He then underwent cardiac cath which showed multivessel disease. He was referred to cardiothoracic surgeon. Labs revealed mild LFT elevations on rosuvastatin. Rosuvastatin was held.   AST 47, ALT 61, AST 42, ALT 58 after holding. Prior to starting statin AST was 43 and ALT was 69  Patient presents to lipid clinic today.  He is presently still holding rosuvastatin.  He is extremely motivated to get his cholesterol and weight in order.  His girlfriend is a Investment banker, operational and cooks and a nutritious dinner.  She controls his portions.  He will take the next day.  He drinks a protein shake or either breakfast or lunch.  Exercise is limited knee pain and shortness of breath.  He still works part-time but is on a limited income.  He has a Medicare supplement and then a part D plan.  Cannot afford to go into the coverage gap.  Reviewed options for lowering LDL cholesterol, including ezetimibe, PCSK-9 inhibitors and inclisiran.  Discussed mechanisms of action, dosing, side effects and potential decreases in LDL cholesterol.  Also reviewed cost information and potential options for patient assistance.   Current Medications: Rosuvastatin 10 mg daily currently on hold Intolerances: None Risk Factors: Four-vessel CAD requiring CABG, prediabetes, hypertension, family history, CKD LDL-C goal: <55 ApoB goal: <70  Diet:  Breakfast:OWYN protein shake Lunch: Salad sometimes from [employer].  Leftovers from dinner Dinner:  Exercise: Very limited  Family History:  Family History  Problem Relation Age of Onset   Heart  disease Mother    Diabetes Mother    Obesity Mother    Hyperlipidemia Mother    Cancer Father        skin cancer   Heart disease Father    Hyperlipidemia Father    Colon cancer Neg Hx    Esophageal cancer Neg Hx    Rectal cancer Neg Hx    Stomach cancer Neg Hx      Social History:  Social History   Socioeconomic History   Marital status: Media planner    Spouse name: Not on file   Number of children: 2   Years of education: Not on file   Highest education level: Not on file  Occupational History   Occupation: Horticulturist, commercial    Comment: Part-Time  Tobacco Use   Smoking status: Never   Smokeless tobacco: Current    Types: Snuff  Vaping Use   Vaping Use: Never used  Substance and Sexual Activity   Alcohol use: Yes    Comment: occasionally   Drug use: Never   Sexual activity: Yes  Other Topics Concern   Not on file  Social History Narrative   Not on file   Social Determinants of Health   Financial Resource Strain: Low Risk  (03/05/2022)   Overall Financial Resource Strain (CARDIA)    Difficulty of Paying Living Expenses: Not hard at all  Food Insecurity: No Food Insecurity (03/05/2022)  Hunger Vital Sign    Worried About Running Out of Food in the Last Year: Never true    Ran Out of Food in the Last Year: Never true  Transportation Needs: No Transportation Needs (03/05/2022)   PRAPARE - Administrator, Civil Service (Medical): No    Lack of Transportation (Non-Medical): No  Physical Activity: Inactive (03/05/2022)   Exercise Vital Sign    Days of Exercise per Week: 0 days    Minutes of Exercise per Session: 0 min  Stress: No Stress Concern Present (03/05/2022)   [employer] of Occupational Health - Occupational Stress Questionnaire    Feeling of Stress : Not at all  Social Connections: Moderately Integrated (03/05/2022)   Social Connection and Isolation Panel [NHANES]    Frequency of Communication with Friends and Family: Three times a  week    Frequency of Social Gatherings with Friends and Family: Three times a week    Attends Religious Services: More than 4 times per year    Active Member of Clubs or Organizations: No    Attends Banker Meetings: Never    Marital Status: Living with partner  Intimate Partner Violence: Not At Risk (03/05/2022)   Humiliation, Afraid, Rape, and Kick questionnaire    Fear of Current or Ex-Partner: No    Emotionally Abused: No    Physically Abused: No    Sexually Abused: No     Labs: Lipid Panel     Component Value Date/Time   CHOL 174 10/28/2022 1020   TRIG 140 10/28/2022 1020   HDL 46 10/28/2022 1020   CHOLHDL 3.8 10/28/2022 1020   CHOLHDL 4 10/17/2021 1705   VLDL 22.2 10/17/2021 1705   LDLCALC 103 (H) 10/28/2022 1020   LABVLDL 25 10/28/2022 1020    Past Medical History:  Diagnosis Date   Acne    Arthritis    Asthma    Bipolar 1 disorder (HCC)    Cancer (HCC)    basal cell cancer removed from back   Cataract    Depression    Dysrhythmia    PVCs   GERD (gastroesophageal reflux disease)    Heart murmur    Hepatitis    remote hx Hepatitis A   Hyperlipidemia    Hypertension    Mitral valve prolapse    Panic attacks    PFO (patent foramen ovale)    ? small PFO per echo   Pre-diabetes    Sleep apnea     Current Outpatient Medications on File Prior to Visit  Medication Sig Dispense Refill   acetaminophen (TYLENOL) 500 MG tablet Take 500 mg by mouth every morning.     albuterol (VENTOLIN HFA) 108 (90 Base) MCG/ACT inhaler Inhale 1 puff into the lungs every 6 (six) hours as needed for wheezing or shortness of breath.     amLODipine (NORVASC) 5 MG tablet Take 1 tablet (5 mg total) by mouth daily. (Patient taking differently: Take 5 mg by mouth at bedtime.) 90 tablet 3   APPLE CIDER VINEGAR PO Take 15 mLs by mouth every Monday, Wednesday, and Friday.     Ascorbic Acid (VITAMIN C) 1000 MG tablet Take 3,000 mg by mouth daily.     aspirin EC 81 MG tablet  Take 1 tablet (81 mg total) by mouth daily. Swallow whole. 30 tablet 12   calcium elemental as carbonate (TUMS ULTRA 1000) 400 MG chewable tablet Chew 2,000 mg by mouth 3 (three) times daily as needed for heartburn.  Cholecalciferol (DIALYVITE VITAMIN D 5000) 125 MCG (5000 UT) capsule Take 20,000 Units by mouth daily.     Coenzyme Q10 (COQ-10) 100 MG CAPS Take 100 mg by mouth daily.     Flaxseed, Linseed, (FLAX SEED OIL) 1000 MG CAPS Take 1,000 mg by mouth daily.     folic acid (FOLVITE) 800 MCG tablet Take 800 mcg by mouth daily.     hydrochlorothiazide (HYDRODIURIL) 25 MG tablet Take 1 tablet (25 mg total) by mouth daily. 90 tablet 2   lamoTRIgine (LAMICTAL) 100 MG tablet Take 100 mg by mouth daily.     Liniments (BLUE-EMU SUPER STRENGTH EX) Apply 1 Application topically daily as needed (pain).     magnesium oxide (MAG-OX) 400 MG tablet Take 400 mg by mouth daily.     metoprolol tartrate (LOPRESSOR) 100 MG tablet Take one (1) tablet by mouth (100 mg) 2 hours prior to CT scan. (Patient not taking: Reported on 11/18/2022) 1 tablet 0   Multiple Vitamins-Minerals (MULTIVITAMIN WITH MINERALS) tablet Take 1 tablet by mouth daily.     Omega 3 1000 MG CAPS Take 1,000 mg by mouth daily.     OVER THE COUNTER MEDICATION Nano CBD OIL: Pt takes 20 drops in the morning and 20 drops in the evening.     sildenafil (VIAGRA) 50 MG tablet Take 50 mg by mouth daily as needed for erectile dysfunction.     valsartan (DIOVAN) 320 MG tablet Take 1 tablet (320 mg total) by mouth daily. 90 tablet 3   zinc gluconate 50 MG tablet Take 50 mg by mouth daily.     No current facility-administered medications on file prior to visit.    Allergies  Allergen Reactions   Latex Dermatitis    Assessment/Plan:  1. Hyperlipidemia -  Hyperlipidemia Assessment: LDL-C is above goal of less than 55.  At last check it was 103 Upon review his AST and ALT were elevated prior to starting statin therapy.  Still very mild  elevation. AST was 43 and ALT was 69 I think is mild LFT elevation is due to his fatty liver and or lamotrigine.  More likely fatty liver Okay to continue with statin therapy.  But given his LDL of 103 he will need more aggressive therapy. Patient cannot afford to go into the donut hole therefore branded medications like PCSK9 or Nexlizet would be a challenge for patient to afford.  However patient does have a Medicare supplement plan which would cover Leqvio fully.  We did discuss how the cardiovascular outcomes trial has not completed for this medication but we do suspect to see a reduction in MI and stroke.  Reviewed its LDL-C reduction and dosing schedule.  Patient has signed [facility] service center start form.  I will submit this and then refer patient to [facility] infusion center.  Plan Resume rosuvastatin 10 mg daily Start Leqvio injection at 0,3 months and then every 6 months thereafter.  Will complete start form and once benefits confirmed will refer to [facility] infusion center Continue to make improvements in diet Once CABG is complete patient should participate in cardiac rehab and begin to increase his exercise Will need follow-up labs in about 3 months   Thank you,  Olene Floss, Pharm.D, BCPS, CPP [facility] [address] [place], [place] [address]  Phone: [phone]; Fax: [phone]

## 2022-12-10 NOTE — Patient Instructions (Signed)
Resume rosuvastatin  daily I will submit a request a review of your benefits to confirm coverage of Leqvio I will call you once we hear back. Then you will be contacted by our [facility] infusion center will reach out to you for scheduling.  Please call me at [phone] with questions Continue your plans for weight loss

## 2022-12-17 ENCOUNTER — Other Ambulatory Visit: Payer: Self-pay | Admitting: Pharmacist

## 2022-12-17 ENCOUNTER — Telehealth: Payer: Self-pay | Admitting: Pharmacist

## 2022-12-17 NOTE — Telephone Encounter (Signed)
Leqvio covered 80% by medicare and 20% by [facility] supplement Referral sent to [facility] infusion center. Spoke to pt who is in agreement with plan.

## 2022-12-18 ENCOUNTER — Other Ambulatory Visit: Payer: Self-pay | Admitting: *Deleted

## 2022-12-18 ENCOUNTER — Institutional Professional Consult (permissible substitution) (INDEPENDENT_AMBULATORY_CARE_PROVIDER_SITE_OTHER): Payer: Medicare Other | Admitting: Surgery

## 2022-12-18 ENCOUNTER — Encounter: Payer: Self-pay | Admitting: Surgery

## 2022-12-18 ENCOUNTER — Telehealth: Payer: Self-pay | Admitting: Pharmacy Technician

## 2022-12-18 ENCOUNTER — Encounter: Payer: Self-pay | Admitting: *Deleted

## 2022-12-18 VITALS — BP 137/72 | HR 104 | Resp 20 | Ht 71.0 in | Wt 320.0 lb

## 2022-12-18 DIAGNOSIS — I2510 Atherosclerotic heart disease of native coronary artery without angina pectoris: Secondary | ICD-10-CM

## 2022-12-18 DIAGNOSIS — R7989 Other specified abnormal findings of blood chemistry: Secondary | ICD-10-CM

## 2022-12-18 DIAGNOSIS — R9439 Abnormal result of other cardiovascular function study: Secondary | ICD-10-CM

## 2022-12-18 DIAGNOSIS — Z5181 Encounter for therapeutic drug level monitoring: Secondary | ICD-10-CM

## 2022-12-18 NOTE — Telephone Encounter (Signed)
Bryan Brock note:  Patient will be scheduled as soon as possible.  Auth Submission: NO AUTH NEEDED Site of care: Site of care: [facility] Payer: Medicare a/b & BCBS supp Medication & CPT/J Code(s) submitted: Leqvio (Inclisiran) J1306 Route of submission (phone, fax, portal):  Phone # Fax # Auth type: Buy/Bill Units/visits requested: x2 Reference number: BIV: leqvio service center Approval from: 12/18/22 to 08/26/23

## 2022-12-18 NOTE — Progress Notes (Signed)
Cardiothoracic Surgery Consultation  PCP is Rudd, Bertram Millard, MD Referring Provider is Orbie Pyo, MD  Chief Complaint  Patient presents with   Coronary Artery Disease    Surgical consult, Cardiac Cath and ECHO 11/21/22    HPI:  The patient is a 67 year old gentleman with a history of hypertension, hyperlipidemia, prediabetes, obesity, sleep apnea, and mitral valve prolapse.  He has at least a 1 year history of of dyspnea on exertion which he thought may be due to his weight.  He has not had any chest discomfort.  He has noted onset of tiredness and exertional fatigue over the past several months.  He has had mild bilateral lower extremity swelling.  He continues to work part-time in a [employer] and is worn out at the end of his shift.  His most recent echo on 11/21/2022 showed a left ventricular ejection fraction of 55 to 60% with mild concentric LVH and grade 1 diastolic dysfunction.  There is no significant valvular abnormality.  Cardiac catheterization on the same day showed significant three-vessel coronary disease with serial 80% stenoses in the proximal and distal RCA which is a large dominant vessel.  The left circumflex had 80% proximal stenosis and 60% stenosis in the first marginal branch.  The LAD had 60% mid vessel stenosis at the takeoff of the second diagonal branch which also had about 60% proximal stenosis.  The RFR in the LAD was 0.81.  LVEDP was 14 mmHg.  He is here today with his significant other.  He is a non-smoker but does use smokeless tobacco products.  Past Medical History:  Diagnosis Date   Acne    Arthritis    Asthma    Bipolar 1 disorder    Cancer    basal cell cancer removed from back   Cataract    Depression    Dysrhythmia    PVCs   GERD (gastroesophageal reflux disease)    Heart murmur    Hepatitis    remote hx Hepatitis A   Hyperlipidemia    Hypertension    Mitral valve prolapse    Panic attacks    PFO (patent foramen ovale)    ?  small PFO per echo   Pre-diabetes    Sleep apnea     Past Surgical History:  Procedure Laterality Date   CORONARY PRESSURE/FFR STUDY N/A 11/21/2022   Procedure: INTRAVASCULAR PRESSURE WIRE/FFR STUDY;  Surgeon: Orbie Pyo, MD;  Location: [facility];  Service: Cardiovascular;  Laterality: N/A;   HYDROCELE EXCISION Bilateral 03/22/2022   Procedure: HYDROCELECTOMY ADULT;  Surgeon: Noel Christmas, MD;  Location: [facility];  Service: Urology;  Laterality: Bilateral;  2 HRS   LEFT HEART CATH AND CORONARY ANGIOGRAPHY N/A 11/21/2022   Procedure: LEFT HEART CATH AND CORONARY ANGIOGRAPHY;  Surgeon: Orbie Pyo, MD;  Location: [facility];  Service: Cardiovascular;  Laterality: N/A;   MOHS SURGERY  1990   STRABISMUS SURGERY Left    TONSILLECTOMY AND ADENOIDECTOMY     WISDOM TOOTH EXTRACTION      Family History  Problem Relation Age of Onset   Heart disease Mother    Diabetes Mother    Obesity Mother    Hyperlipidemia Mother    Cancer Father        skin cancer   Heart disease Father    Hyperlipidemia Father    Colon cancer Neg Hx    Esophageal cancer Neg Hx    Rectal cancer Neg  Hx    Stomach cancer Neg Hx     Social History Social History   Tobacco Use   Smoking status: Never   Smokeless tobacco: Current    Types: Snuff  Vaping Use   Vaping Use: Never used  Substance Use Topics   Alcohol use: Yes    Comment: occasionally   Drug use: Never    Current Outpatient Medications  Medication Sig Dispense Refill   acetaminophen (TYLENOL) 500 MG tablet Take 500 mg by mouth every morning.     albuterol (VENTOLIN HFA) 108 (90 Base) MCG/ACT inhaler Inhale 1 puff into the lungs every 6 (six) hours as needed for wheezing or shortness of breath.     amLODipine (NORVASC) 5 MG tablet Take 1 tablet (5 mg total) by mouth daily. (Patient taking differently: Take 5 mg by mouth at bedtime.) 90 tablet 3   APPLE CIDER VINEGAR PO Take 15 mLs by mouth every Monday, Wednesday,  and Friday.     Ascorbic Acid (VITAMIN C) 1000 MG tablet Take 3,000 mg by mouth daily.     aspirin EC 81 MG tablet Take 1 tablet (81 mg total) by mouth daily. Swallow whole. 30 tablet 12   calcium elemental as carbonate (TUMS ULTRA 1000) 400 MG chewable tablet Chew 2,000 mg by mouth 3 (three) times daily as needed for heartburn.     Cholecalciferol (DIALYVITE VITAMIN D 5000) 125 MCG (5000 UT) capsule Take 20,000 Units by mouth daily.     Coenzyme Q10 (COQ-10) 100 MG CAPS Take 100 mg by mouth daily.     Flaxseed, Linseed, (FLAX SEED OIL) 1000 MG CAPS Take 1,000 mg by mouth daily.     folic acid (FOLVITE) 800 MCG tablet Take 800 mcg by mouth daily.     hydrochlorothiazide (HYDRODIURIL) 25 MG tablet Take 1 tablet (25 mg total) by mouth daily. 90 tablet 2   lamoTRIgine (LAMICTAL) 100 MG tablet Take 100 mg by mouth daily.     Liniments (BLUE-EMU SUPER STRENGTH EX) Apply 1 Application topically daily as needed (pain).     magnesium oxide (MAG-OX) 400 MG tablet Take 400 mg by mouth daily.     Multiple Vitamins-Minerals (MULTIVITAMIN WITH MINERALS) tablet Take 1 tablet by mouth daily.     Omega 3 1000 MG CAPS Take 1,000 mg by mouth daily.     OVER THE COUNTER MEDICATION Nano CBD OIL: Pt takes 20 drops in the morning and 20 drops in the evening.     rosuvastatin (CRESTOR) 10 MG tablet Take 1 tablet (10 mg total) by mouth daily. 90 tablet 3   sildenafil (VIAGRA) 50 MG tablet Take 50 mg by mouth daily as needed for erectile dysfunction.     valsartan (DIOVAN) 320 MG tablet Take 1 tablet (320 mg total) by mouth daily. 90 tablet 3   zinc gluconate 50 MG tablet Take 50 mg by mouth daily.     No current facility-administered medications for this visit.    Allergies  Allergen Reactions   Latex Dermatitis    Review of Systems  Constitutional:  Positive for activity change and fatigue.  HENT:  Positive for hearing loss.   Eyes:        Floaters  Respiratory:  Positive for cough and shortness of breath.    Cardiovascular:  Positive for palpitations and leg swelling. Negative for chest pain.  Gastrointestinal:        Hiatal hernia and heartburn  Endocrine: Negative.   Genitourinary:  Positive for frequency.  Musculoskeletal:  Positive for arthralgias.  Skin: Negative.   Allergic/Immunologic: Negative.   Neurological:  Positive for dizziness. Negative for syncope.  Hematological: Negative.   Psychiatric/Behavioral: Negative.      BP 137/72 (BP Location: Left Arm, Patient Position: Sitting)   Pulse (!) 104   Resp 20   Ht 5\' 11"  (1.803 m)   Wt (!) 320 lb (145.2 kg)   SpO2 95% Comment: RA  BMI 44.63 kg/m  Physical Exam Constitutional:      Appearance: Normal appearance. He is obese.  HENT:     Head: Normocephalic and atraumatic.  Eyes:     Extraocular Movements: Extraocular movements intact.     Pupils: Pupils are equal, round, and reactive to light.  Neck:     Vascular: No carotid bruit.  Cardiovascular:     Rate and Rhythm: Normal rate and regular rhythm.     Pulses: Normal pulses.     Heart sounds: Normal heart sounds. No murmur heard. Pulmonary:     Effort: Pulmonary effort is normal.     Breath sounds: Normal breath sounds.  Abdominal:     Comments: Obese  Musculoskeletal:        General: Swelling present.  Skin:    General: Skin is warm and dry.  Neurological:     General: No focal deficit present.     Mental Status: He is alert and oriented to person, place, and time.  Psychiatric:        Mood and Affect: Mood normal.        Behavior: Behavior normal.      Diagnostic Tests:  Physicians  Panel Physicians Referring Physician Case Authorizing Physician  Orbie Pyo, MD (Primary)     Procedures  INTRAVASCULAR PRESSURE WIRE/FFR STUDY  LEFT HEART CATH AND CORONARY ANGIOGRAPHY   Conclusion      Prox Cx lesion is 80% stenosed.   1st Mrg lesion is 60% stenosed.   Mid LAD lesion is 60% stenosed.   2nd Diag lesion is 60% stenosed.   Prox RCA-1  lesion is 80% stenosed.   Prox RCA-2 lesion is 80% stenosed.   Dist RCA lesion is 80% stenosed.   1.  Multivessel disease consisting of serial right coronary artery lesions, high-grade left circumflex disease, RFR positive LAD disease, and moderate second diagonal disease. 2.  LVEDP of .   Recommendation: Outpatient cardiothoracic surgical evaluation.   Indications  Coronary artery disease involving native coronary artery of native heart with other form of angina pectoris (HCC) [I25.118 (ICD-10-CM)]   Procedural Details  Technical Details The patient is a 67 year old male with a history of chronic kidney disease, mitral valve prolapse, and a BMI of 45 who was seen in the outpatient setting due to dyspnea.  Lexiscan demonstrated ejection fraction of 45% with no overt ischemia.  He underwent a coronary CTA which suggested multivessel disease with significant lesions being in the right coronary artery, second diagonal, and left circumflex.  Due to ongoing symptoms despite 2 antianginal agents he was referred for an invasive assessment.  After obtaining consent the patient brought to the cardiac catheterization laboratory and prepped draped sterile fashion.  Xylocaine was used to anesthetize the right wrist and a 6 French femoral glide sheath placed there.  5000 units heparin and 5 mg of verapamil were administered through the sheath.  A 6 [place] JR 4 diagnostic catheter and 6 [place] JL 4 diagnostic catheter were used for selective coronary angiography.  The JR4 catheter was used for  left heart catheterization and LVEDP assessment.  After review of the angiogram we elected to pursue an RFR assessment of the LAD.  If this was significant this was suggest multivessel disease that would be best treated with surgery.  If not then PCI of the right coronary artery and distal left circumflex with medical treatment of the ostial second diagonal would be pursued.  After heparinizing to an ACT of  greater than 250.  A 6 French EBU 3.5 guiding catheter was used to cannulate the left main.  Pressure wire was advanced to the guide tip, the guide was vigorously flushed, and the pressure wire was advanced to the distal LAD.  An RFR was then assessed and found to be 0.81.  Therefore further interventions were deferred.  A TR band was placed.  There were no acute complications. Estimated blood loss <50 mL.   During this procedure medications were administered to achieve and maintain moderate conscious sedation while the patient's heart rate, blood pressure, and oxygen saturation were continuously monitored and I was present face-to-face 100% of this time.   Medications (Filter: Administrations occurring from 1029 to 1151 on 11/21/22) Heparin (Porcine) in NaCl 1000-0.9 UT/500ML-% SOLN (mL)  Total volume: 1,000 mL Date/Time Rate/Dose/Volume Action   11/21/22 1034 500 mL Given   1034 500 mL Given   midazolam (VERSED) injection (mg)  Total dose: 2 mg Date/Time Rate/Dose/Volume Action   11/21/22 1039 1 mg Given   1054 1 mg Given   fentaNYL (SUBLIMAZE) injection (mcg)  Total dose: 100 mcg Date/Time Rate/Dose/Volume Action   11/21/22 1039 50 mcg Given   1054 50 mcg Given   lidocaine (PF) (XYLOCAINE) 1 % injection (mL)  Total volume: 2 mL Date/Time Rate/Dose/Volume Action   11/21/22 1056 2 mL Given   Radial Cocktail (Verapamil 5 mg, NTG, Lidocaine) (mL)  Total volume: 15 mL Date/Time Rate/Dose/Volume Action   11/21/22 1056 15 mL Given   heparin sodium (porcine) injection (Units)  Total dose: 15,000 Units Date/Time Rate/Dose/Volume Action   11/21/22 1100 5,000 Units Given   1120 10,000 Units Given   iohexol (OMNIPAQUE) 350 MG/ML injection (mL)  Total volume: 70 mL Date/Time Rate/Dose/Volume Action   11/21/22 1139 70 mL Given    Sedation Time  Sedation Time Physician-1: 53 minutes 30 seconds Contrast     Administrations occurring from 1029 to 1151 on 11/21/22:  Medication Name  Total Dose  iohexol (OMNIPAQUE) 350 MG/ML injection 70 mL   Radiation/Fluoro  Fluoro time: 11.9 (min) DAP: 43807 (mGycm2) Cumulative Air Kerma: 840 (mGy) Complications  Complications documented before study signed (11/21/2022 11:51 AM)   No complications were associated with this study.  Documented by Meryl Crutch, EMT - 11/21/2022 10:36 AM     Coronary Findings  Diagnostic Dominance: Right Left Anterior Descending  Mid LAD lesion is 60% stenosed.    Second Diagonal Branch  2nd Diag lesion is 60% stenosed.    Left Circumflex  There is mild diffuse disease throughout the vessel.  Prox Cx lesion is 80% stenosed.    First Obtuse Marginal Branch  1st Mrg lesion is 60% stenosed.    Right Coronary Artery  There is mild diffuse disease throughout the vessel.  Prox RCA-1 lesion is 80% stenosed.  Prox RCA-2 lesion is 80% stenosed.  Dist RCA lesion is 80% stenosed.    Intervention   No interventions have been documented.   Coronary Diagrams  Diagnostic Dominance: Right  Intervention   Implants   No implant documentation for  this case.   Syngo Images   Show images for CARDIAC CATHETERIZATION Images on Long Term Storage   Show images for Dom, [place] to Procedure Log  Procedure Log    Hemo Data  Flowsheet Row Most Recent Value  AO Systolic Pressure 118 mmHg  AO Diastolic Pressure 68 mmHg  AO Mean 88 mmHg  LV Systolic Pressure 117 mmHg  LV Diastolic Pressure 6 mmHg  LV EDP 14 mmHg  AOp Systolic Pressure 113 mmHg  AOp Diastolic Pressure 72 mmHg  AOp Mean Pressure 90 mmHg  LVp Systolic Pressure 117 mmHg  LVp Diastolic Pressure 5 mmHg  LVp EDP Pressure 13 mmHg    ECHOCARDIOGRAM REPORT       Patient Name:   Bryan Brock Date of Exam: 11/21/2022  Medical Rec #:  [id number]     Height:       71.0 in  Accession #:    [id number]    Weight:       325.0 lb  Date of Birth:  1955-12-12     BSA:          2.592 m  Patient Age:    1 years       BP:           130/78 mmHg  Patient Gender: M             HR:           75 bpm.  Exam Location:  Inpatient   Procedure: 2D Echo, Cardiac Doppler and Color Doppler   Indications:    CAD    History:        Patient has prior history of Echocardiogram examinations,  most                 recent 12/14/2021. Risk Factors:Hypertension, Sleep Apnea,                  Dyslipidemia and Non-Smoker.    Sonographer:    Dondra Prader RVT RCS  Referring Phys: [id number] Corrin Parker     Sonographer Comments: No subcostal window, suboptimal parasternal window,  suboptimal apical window and Technically challenging study due to limited  acoustic windows. Supine.  IMPRESSIONS     1. Left ventricular ejection fraction, by estimation, is 55 to 60%. The  left ventricle has normal function. The left ventricle has no regional  wall motion abnormalities. There is mild concentric left ventricular  hypertrophy. Left ventricular diastolic  parameters are consistent with Grade I diastolic dysfunction (impaired  relaxation). Elevated left atrial pressure.   2. Right ventricular systolic function is normal. The right ventricular  size is normal.   3. Left atrial size was moderately dilated.   4. The mitral valve is normal in structure. No evidence of mitral valve  regurgitation.   5. The aortic valve is tricuspid. Aortic valve regurgitation is not  visualized. No aortic stenosis is present.   FINDINGS   Left Ventricle: Left ventricular ejection fraction, by estimation, is 55  to 60%. The left ventricle has normal function. The left ventricle has no  regional wall motion abnormalities. The left ventricular internal cavity  size was normal in size. There is   mild concentric left ventricular hypertrophy. Left ventricular diastolic  parameters are consistent with Grade I diastolic dysfunction (impaired  relaxation). Elevated left atrial pressure.   Right Ventricle: The right ventricular size is normal. No  increase in  right ventricular wall thickness. Right ventricular systolic function  is  normal.   Left Atrium: Left atrial size was moderately dilated.   Right Atrium: Right atrial size was normal in size.   Pericardium: There is no evidence of pericardial effusion.   Mitral Valve: The mitral valve is normal in structure. No evidence of  mitral valve regurgitation.   Tricuspid Valve: The tricuspid valve is normal in structure. Tricuspid  valve regurgitation is not demonstrated.   Aortic Valve: The aortic valve is tricuspid. Aortic valve regurgitation is  not visualized. No aortic stenosis is present. Aortic valve mean gradient  measures 3.0 mmHg. Aortic valve peak gradient measures 4.9 mmHg. Aortic  valve area, by VTI measures 3.00  cm.   Pulmonic Valve: The pulmonic valve was grossly normal. Pulmonic valve  regurgitation is not visualized.   Aorta: The aortic root is normal in size and structure.   IAS/Shunts: No atrial level shunt detected by color flow Doppler.     LEFT VENTRICLE  PLAX 2D  LVIDd:         5.10 cm   Diastology  LVIDs:         3.20 cm   LV e' medial:    9.20 cm/s  LV PW:         1.20 cm   LV E/e' medial:  6.6  LV IVS:        1.30 cm   LV e' lateral:   11.20 cm/s  LVOT diam:     2.20 cm   LV E/e' lateral: 5.4  LV SV:         62  LV SV Index:   24  LVOT Area:     3.80 cm     RIGHT VENTRICLE  RV Basal diam:  3.80 cm  RV S prime:     16.60 cm/s  TAPSE (M-mode): 2.4 cm   LEFT ATRIUM             Index        RIGHT ATRIUM           Index  LA diam:        4.60 cm 1.77 cm/m   RA Area:     13.60 cm  LA Vol (A2C):   55.0 ml 21.22 ml/m  RA Volume:   29.70 ml  11.46 ml/m  LA Vol (A4C):   61.4 ml 23.67 ml/m  LA Biplane Vol: 57.4 ml 22.15 ml/m   AORTIC VALVE                    PULMONIC VALVE  AV Area (Vmax):    3.25 cm     PV Vmax:       0.95 m/s  AV Area (Vmean):   3.23 cm     PV Peak grad:  3.6 mmHg  AV Area (VTI):     3.00 cm  AV Vmax:            111.00 cm/s  AV Vmean:          73.400 cm/s  AV VTI:            0.208 m  AV Peak Grad:      4.9 mmHg  AV Mean Grad:      3.0 mmHg  LVOT Vmax:         94.80 cm/s  LVOT Vmean:        62.400 cm/s  LVOT VTI:          0.164 m  LVOT/AV VTI  ratio: 0.79    AORTA  Ao Root diam: 3.30 cm  Ao Arch diam: 3.4 cm   MITRAL VALVE  MV Area (PHT): 2.46 cm    SHUNTS  MV Decel Time: 309 msec    Systemic VTI:  0.16 m  MV E velocity: 60.35 cm/s  Systemic Diam: 2.20 cm  MV A velocity: 76.75 cm/s  MV E/A ratio:  0.79   Mihai Croitoru MD  Electronically signed by Thurmon Fair MD  Signature Date/Time: 11/21/2022/3:32:43 PM        Final       Impression:  This 67 year old gentleman has severe three-vessel coronary disease with coronary CT FFR data consistent with hemodynamically significant stenosis in the RCA, left circumflex, and second diagonal branch and RFR positive LAD disease on catheterization.  Echocardiogram shows normal left ventricular systolic function with no significant valvular abnormality.  I agree that coronary bypass graft surgery is the best treatment for resolution of his exertional fatigue and shortness of breath and to preserve myocardium. I discussed the operative procedure with the patient and and his significant other including alternatives, benefits and risks; including but not limited to bleeding, blood transfusion, infection, stroke, myocardial infarction, graft failure, heart block requiring a permanent pacemaker, organ dysfunction, and death.  Bryan Brock understands and agrees to proceed.    Plan:  He will be scheduled for coronary bypass surgery on 01/02/2023.  I spent 60 minutes performing this consultation and > 50% of this time was spent face to face counseling and coordinating the care of this patient's severe multivessel coronary disease [name], MD [facility] [phone]

## 2022-12-20 NOTE — Telephone Encounter (Signed)
Call to schedule follow up lab. Patient requests call back on May 2 after his 1st injection.

## 2022-12-26 ENCOUNTER — Ambulatory Visit (INDEPENDENT_AMBULATORY_CARE_PROVIDER_SITE_OTHER): Payer: Medicare Other | Admitting: *Deleted

## 2022-12-26 VITALS — BP 168/84 | HR 84 | Temp 97.8°F | Resp 16 | Ht 71.0 in | Wt 319.8 lb

## 2022-12-26 DIAGNOSIS — E785 Hyperlipidemia, unspecified: Secondary | ICD-10-CM

## 2022-12-26 DIAGNOSIS — I10 Essential (primary) hypertension: Secondary | ICD-10-CM

## 2022-12-26 MED ORDER — INCLISIRAN SODIUM 284 MG/1.5ML ~~LOC~~ SOSY
284.0000 mg | PREFILLED_SYRINGE | Freq: Once | SUBCUTANEOUS | Status: AC
  Administered 2022-12-26: 284 mg via SUBCUTANEOUS
  Filled 2022-12-26: qty 1.5

## 2022-12-26 NOTE — Patient Instructions (Signed)
Inclisiran Injection What is this medication? INCLISIRAN (in kli SIR an) treats high cholesterol. It works by decreasing bad cholesterol (such as LDL) in your blood. Changes to diet and exercise are often combined with this medication. This medicine may be used for other purposes; ask your health care provider or pharmacist if you have questions. COMMON BRAND NAME(S): LEQVIO What should I tell my care team before I take this medication? They need to know if you have any of these conditions: An unusual or allergic reaction to inclisiran, other medications, foods, dyes, or preservatives Pregnant or trying to get pregnant Breast-feeding How should I use this medication? This medication is injected under the skin. It is given by your care team in a hospital or clinic setting. Talk to your care team about the use of this medication in children. Special care may be needed. Overdosage: If you think you have taken too much of this medicine contact a poison control center or emergency room at once. NOTE: This medicine is only for you. Do not share this medicine with others. What if I miss a dose? Keep appointments for follow-up doses. It is important not to miss your dose. Call your care team if you are unable to keep an appointment. What may interact with this medication? Interactions are not expected. This list may not describe all possible interactions. Give your health care provider a list of all the medicines, herbs, non-prescription drugs, or dietary supplements you use. Also tell them if you smoke, drink alcohol, or use illegal drugs. Some items may interact with your medicine. What should I watch for while using this medication? Visit your care team for regular checks on your progress. Tell your care team if your symptoms do not start to get better or if they get worse. You may need blood work while you are taking this medication. What side effects may I notice from receiving this  medication? Side effects that you should report to your care team as soon as possible: Allergic reactions--skin rash, itching, hives, swelling of the face, lips, tongue, or throat Side effects that usually do not require medical attention (report these to your care team if they continue or are bothersome): Joint pain Pain, redness, or irritation at injection site This list may not describe all possible side effects. Call your doctor for medical advice about side effects. You may report side effects to FDA at 1-800-FDA-1088. Where should I keep my medication? This medication is given in a hospital or clinic. It will not be stored at home. NOTE: This sheet is a summary. It may not cover all possible information. If you have questions about this medicine, talk to your doctor, pharmacist, or health care provider.  2023 Elsevier/Gold Standard (2020-08-30 00:00:00)  

## 2022-12-26 NOTE — Progress Notes (Signed)
Diagnosis: Hyperlipidemia  Provider:  Praveen Mannam MD  Procedure: Injection  Leqvio (inclisiran), Dose: 284 mg, Site: subcutaneous, Number of injections: 1  Post Care: Observation period completed  Discharge: Condition: Good, Destination: Home . AVS Provided  Performed by:  Johonna Binette A, RN       

## 2022-12-30 ENCOUNTER — Ambulatory Visit: Payer: Medicare Other | Admitting: Nurse Practitioner

## 2022-12-30 NOTE — Pre-Procedure Instructions (Signed)
Surgical Instructions    Your procedure is scheduled on Thursday, May 9th.  Report to [facility] Main Entrance "A" at 09:00 A.M., then check in with the Admitting office.  Call this number if you have problems the morning of surgery:  [phone]  If you have any questions prior to your surgery date call [phone]: Open Monday-Friday 8am-4pm If you experience any cold or flu symptoms such as cough, fever, chills, shortness of breath, etc. between now and your scheduled surgery, please notify us at the above number.     Remember:  Do not eat or drink after midnight the night before your surgery    Take these medicines the morning of surgery with A SIP OF WATER  lamoTRIgine (LAMICTAL)  rosuvastatin (CRESTOR)    If needed: albuterol (VENTOLIN HFA)- bring inhaler with you on day of surgery    Follow your surgeon's instructions on when to stop Aspirin.  If no instructions were given by your surgeon then you will need to call the office to get those instructions.     As of today, STOP taking any Aleve, Naproxen, Ibuprofen, Motrin, Advil, Goody's, BC's, all herbal medications, fish oil, diclofenac Sodium (VOLTAREN) gel and all vitamins.                     Do NOT Smoke (Tobacco/Vaping) for 24 hours prior to your procedure.  If you use a CPAP at night, you may bring your mask/headgear for your overnight stay.   Contacts, glasses, piercing's, hearing aid's, dentures or partials may not be worn into surgery, please bring cases for these belongings.    For patients admitted to the hospital, discharge time will be determined by your treatment team.   Patients discharged the day of surgery will not be allowed to drive home, and someone needs to stay with them for 24 hours.  SURGICAL WAITING ROOM VISITATION Patients having surgery or a procedure may have no more than 2 support people in the waiting area - these visitors may rotate.   Children under the age of 5 must have an adult  with them who is not the patient. If the patient needs to stay at the hospital during part of their recovery, the visitor guidelines for inpatient rooms apply. Pre-op nurse will coordinate an appropriate time for 1 support person to accompany patient in pre-op.  This support person may not rotate.   Please refer to the [facility] website for the visitor guidelines for Inpatients (after your surgery is over and you are in a regular room).    Special instructions:   - Preparing For Surgery  Before surgery, you can play an important role. Because skin is not sterile, your skin needs to be as free of germs as possible. You can reduce the number of germs on your skin by washing with CHG (chlorahexidine gluconate) Soap before surgery.  CHG is an antiseptic cleaner which kills germs and bonds with the skin to continue killing germs even after washing.    Oral Hygiene is also important to reduce your risk of infection.  Remember - BRUSH YOUR TEETH THE MORNING OF SURGERY WITH YOUR REGULAR TOOTHPASTE  Please do not use if you have an allergy to CHG or antibacterial soaps. If your skin becomes reddened/irritated stop using the CHG.  Do not shave (including legs and underarms) for at least 48 hours prior to first CHG shower. It is OK to shave your face.  Please follow these instructions carefully.  Shower the NIGHT BEFORE SURGERY and the MORNING OF SURGERY  If you chose to wash your hair, wash your hair first as usual with your normal shampoo.  After you shampoo, rinse your hair and body thoroughly to remove the shampoo.  Use CHG Soap as you would any other liquid soap. You can apply CHG directly to the skin and wash gently with a scrungie or a clean washcloth.   Apply the CHG Soap to your body ONLY FROM THE NECK DOWN.  Do not use on open wounds or open sores. Avoid contact with your eyes, ears, mouth and genitals (private parts). Wash Face and genitals (private parts)  with your normal  soap.   Wash thoroughly, paying special attention to the area where your surgery will be performed.  Thoroughly rinse your body with warm water from the neck down.  DO NOT shower/wash with your normal soap after using and rinsing off the CHG Soap.  Pat yourself dry with a CLEAN TOWEL.  Wear CLEAN PAJAMAS to bed the night before surgery  Place CLEAN SHEETS on your bed the night before your surgery  DO NOT SLEEP WITH PETS.   Day of Surgery: Take a shower with CHG soap. Do not wear jewelry  Do not wear lotions, powders, colognes, or deodorant. Men may shave face and neck. Do not bring valuables to the hospital. [facility] is not responsible for any belongings or valuables.  Wear Clean/Comfortable clothing the morning of surgery Remember to brush your teeth WITH YOUR REGULAR TOOTHPASTE.   Please read over the following fact sheets that you were given.    If you received a COVID test during your pre-op visit  it is requested that you wear a mask when out in public, stay away from anyone that may not be feeling well and notify your surgeon if you develop symptoms. If you have been in contact with anyone that has tested positive in the last 10 days please notify you surgeon.

## 2022-12-31 ENCOUNTER — Ambulatory Visit (HOSPITAL_COMMUNITY)
Admission: RE | Admit: 2022-12-31 | Discharge: 2022-12-31 | Disposition: A | Discharge status: Home / Self Care | Payer: Medicare Other | Source: Ambulatory Visit | Attending: Surgery | Admitting: Surgery

## 2022-12-31 ENCOUNTER — Ambulatory Visit (HOSPITAL_BASED_OUTPATIENT_CLINIC_OR_DEPARTMENT_OTHER)
Admission: RE | Admit: 2022-12-31 | Discharge: 2022-12-31 | Disposition: A | Discharge status: Home / Self Care | Payer: Medicare Other | Source: Ambulatory Visit | Attending: Surgery | Admitting: Surgery

## 2022-12-31 ENCOUNTER — Other Ambulatory Visit: Payer: Self-pay

## 2022-12-31 ENCOUNTER — Encounter (HOSPITAL_COMMUNITY)
Admission: RE | Admit: 2022-12-31 | Discharge: 2022-12-31 | Disposition: A | Discharge status: Home / Self Care | Payer: Medicare Other | Source: Ambulatory Visit | Attending: Surgery | Admitting: Surgery

## 2022-12-31 ENCOUNTER — Encounter (HOSPITAL_COMMUNITY): Payer: Self-pay

## 2022-12-31 VITALS — BP 154/89 | HR 93 | Temp 98.6°F | Resp 17 | Ht 71.0 in | Wt 314.5 lb

## 2022-12-31 DIAGNOSIS — I2510 Atherosclerotic heart disease of native coronary artery without angina pectoris: Secondary | ICD-10-CM

## 2022-12-31 DIAGNOSIS — Z01818 Encounter for other preprocedural examination: Secondary | ICD-10-CM | POA: Insufficient documentation

## 2022-12-31 DIAGNOSIS — I10 Essential (primary) hypertension: Secondary | ICD-10-CM | POA: Insufficient documentation

## 2022-12-31 DIAGNOSIS — Z5181 Encounter for therapeutic drug level monitoring: Secondary | ICD-10-CM

## 2022-12-31 DIAGNOSIS — R7989 Other specified abnormal findings of blood chemistry: Secondary | ICD-10-CM | POA: Insufficient documentation

## 2022-12-31 DIAGNOSIS — Z1152 Encounter for screening for COVID-19: Secondary | ICD-10-CM | POA: Insufficient documentation

## 2022-12-31 DIAGNOSIS — E785 Hyperlipidemia, unspecified: Secondary | ICD-10-CM | POA: Insufficient documentation

## 2022-12-31 HISTORY — DX: Peripheral vascular disease, unspecified: I73.9

## 2022-12-31 HISTORY — DX: Dyspnea, unspecified: R06.00

## 2022-12-31 HISTORY — DX: Personal history of other diseases of the digestive system: Z87.19

## 2022-12-31 HISTORY — DX: Nausea with vomiting, unspecified: Z98.890

## 2022-12-31 HISTORY — DX: Atherosclerotic heart disease of native coronary artery without angina pectoris: I25.10

## 2022-12-31 HISTORY — DX: Other specified postprocedural states: R11.2

## 2022-12-31 HISTORY — DX: Other specified postprocedural states: Z98.890

## 2022-12-31 HISTORY — DX: Attention-deficit hyperactivity disorder, unspecified type: F90.9

## 2022-12-31 LAB — PROTIME-INR
INR: 1 (ref 0.8–1.2)
Prothrombin Time: 13.3 seconds (ref 11.4–15.2)

## 2022-12-31 LAB — VAS US DOPPLER PRE CABG
Left ABI: 1.12
Right ABI: 1.22

## 2022-12-31 LAB — CBC
HCT: 42 % (ref 39.0–52.0)
Hemoglobin: 14.2 g/dL (ref 13.0–17.0)
MCH: 31.6 pg (ref 26.0–34.0)
MCHC: 33.8 g/dL (ref 30.0–36.0)
MCV: 93.5 fL (ref 80.0–100.0)
Platelets: 182 10*3/uL (ref 150–400)
RBC: 4.49 MIL/uL (ref 4.22–5.81)
RDW: 12.9 % (ref 11.5–15.5)
WBC: 5.7 10*3/uL (ref 4.0–10.5)
nRBC: 0 % (ref 0.0–0.2)

## 2022-12-31 LAB — BLOOD GAS, ARTERIAL
Acid-Base Excess: 0.1 mmol/L (ref 0.0–2.0)
Bicarbonate: 23.8 mmol/L (ref 20.0–28.0)
Drawn by: 58743
O2 Saturation: 99.1 %
Patient temperature: 37
pCO2 arterial: 35 mmHg (ref 32–48)
pH, Arterial: 7.44 (ref 7.35–7.45)
pO2, Arterial: 102 mmHg (ref 83–108)

## 2022-12-31 LAB — COMPREHENSIVE METABOLIC PANEL
ALT: 63 U/L — ABNORMAL HIGH (ref 0–44)
AST: 50 U/L — ABNORMAL HIGH (ref 15–41)
Albumin: 3.7 g/dL (ref 3.5–5.0)
Alkaline Phosphatase: 71 U/L (ref 38–126)
Anion gap: 11 (ref 5–15)
BUN: 18 mg/dL (ref 8–23)
CO2: 20 mmol/L — ABNORMAL LOW (ref 22–32)
Calcium: 9.2 mg/dL (ref 8.9–10.3)
Chloride: 100 mmol/L (ref 98–111)
Creatinine, Ser: 1.26 mg/dL — ABNORMAL HIGH (ref 0.61–1.24)
GFR, Estimated: >60 mL/min (ref >60)
Glucose, Bld: 128 mg/dL — ABNORMAL HIGH (ref 70–99)
Potassium: 4.2 mmol/L (ref 3.5–5.1)
Sodium: 131 mmol/L — ABNORMAL LOW (ref 135–145)
Total Bilirubin: 1 mg/dL (ref 0.3–1.2)
Total Protein: 6.4 g/dL — ABNORMAL LOW (ref 6.5–8.1)

## 2022-12-31 LAB — URINALYSIS, ROUTINE W REFLEX MICROSCOPIC
Bilirubin Urine: NEGATIVE
Glucose, UA: NEGATIVE mg/dL
Hgb urine dipstick: NEGATIVE
Ketones, ur: NEGATIVE mg/dL
Leukocytes,Ua: NEGATIVE
Nitrite: NEGATIVE
Protein, ur: NEGATIVE mg/dL
Specific Gravity, Urine: 1.018 (ref 1.005–1.030)
pH: 6 (ref 5.0–8.0)

## 2022-12-31 LAB — HEMOGLOBIN A1C
Hgb A1c MFr Bld: 6.6 % — ABNORMAL HIGH (ref 4.8–5.6)
Mean Plasma Glucose: 142.72 mg/dL

## 2022-12-31 LAB — SARS CORONAVIRUS 2 (TAT 6-24 HRS): SARS Coronavirus 2: NEGATIVE

## 2022-12-31 LAB — APTT: aPTT: 25 seconds (ref 24–36)

## 2022-12-31 LAB — SURGICAL PCR SCREEN
MRSA, PCR: NEGATIVE
Staphylococcus aureus: NEGATIVE

## 2022-12-31 NOTE — Progress Notes (Signed)
PCP - Dr. Herbie Drape Cardiologist - Dr. Laurance Flatten  PPM/ICD - denies   Chest x-ray - 12/31/22 EKG - 12/31/22 Stress Test - 12/14/21 ECHO - 11/21/22 Cardiac Cath - 11/21/22  Sleep Study - 12/21/21, Mild OSA. No CPAP recommended, per pt CPAP - denies  DM- denies  Blood Thinner Instructions: n/a Aspirin Instructions: Hold DOS  ERAS Protcol - no, NPO   COVID TEST- 12/31/22   Anesthesia review: yes, cardiac hx  Patient denies shortness of breath, fever, cough and chest pain at PAT appointment   All instructions explained to the patient, with a verbal understanding of the material. Patient agrees to go over the instructions while at home for a better understanding. Patient also instructed to wear a mask in public after being tested for COVID-19. The opportunity to ask questions was provided.

## 2023-01-01 MED ORDER — CEFAZOLIN SODIUM-DEXTROSE 2-4 GM/100ML-% IV SOLN
2.0000 g | INTRAVENOUS | Status: AC
  Administered 2023-01-02: 2 g via INTRAVENOUS
  Filled 2023-01-01: qty 100

## 2023-01-01 MED ORDER — TRANEXAMIC ACID (OHS) PUMP PRIME SOLUTION
2.0000 mg/kg | INTRAVENOUS | Status: DC
  Filled 2023-01-01: qty 2.85

## 2023-01-01 MED ORDER — HEPARIN 30,000 UNITS/1000 ML (OHS) CELLSAVER SOLUTION
Status: DC
  Filled 2023-01-01: qty 1000

## 2023-01-01 MED ORDER — EPINEPHRINE HCL 5 MG/250ML IV SOLN IN NS
0.0000 ug/min | INTRAVENOUS | Status: DC
  Filled 2023-01-01: qty 250

## 2023-01-01 MED ORDER — INSULIN REGULAR(HUMAN) IN NACL 100-0.9 UT/100ML-% IV SOLN
INTRAVENOUS | Status: AC
  Administered 2023-01-02: 1.3 [IU]/h via INTRAVENOUS
  Filled 2023-01-01: qty 100

## 2023-01-01 MED ORDER — PLASMA-LYTE A IV SOLN
INTRAVENOUS | Status: DC
  Filled 2023-01-01: qty 2.5

## 2023-01-01 MED ORDER — POTASSIUM CHLORIDE 2 MEQ/ML IV SOLN
80.0000 meq | INTRAVENOUS | Status: DC
  Filled 2023-01-01: qty 40

## 2023-01-01 MED ORDER — TRANEXAMIC ACID 1000 MG/10ML IV SOLN
1.5000 mg/kg/h | INTRAVENOUS | Status: AC
  Administered 2023-01-02: 1.5 mg/kg/h via INTRAVENOUS
  Filled 2023-01-01 (×2): qty 25

## 2023-01-01 MED ORDER — MILRINONE LACTATE IN DEXTROSE 20-5 MG/100ML-% IV SOLN
0.3000 ug/kg/min | INTRAVENOUS | Status: DC
  Filled 2023-01-01: qty 100

## 2023-01-01 MED ORDER — NOREPINEPHRINE 4 MG/250ML-% IV SOLN
0.0000 ug/min | INTRAVENOUS | Status: DC
  Filled 2023-01-01: qty 250

## 2023-01-01 MED ORDER — TRANEXAMIC ACID (OHS) BOLUS VIA INFUSION
15.0000 mg/kg | INTRAVENOUS | Status: AC
  Administered 2023-01-02: 2140.5 mg via INTRAVENOUS
  Filled 2023-01-01: qty 2141

## 2023-01-01 MED ORDER — PHENYLEPHRINE HCL-NACL 20-0.9 MG/250ML-% IV SOLN
30.0000 ug/min | INTRAVENOUS | Status: AC
  Administered 2023-01-02: 25 ug/min via INTRAVENOUS
  Filled 2023-01-01: qty 250

## 2023-01-01 MED ORDER — MAGNESIUM SULFATE 50 % IJ SOLN
40.0000 meq | INTRAMUSCULAR | Status: DC
  Filled 2023-01-01: qty 9.85

## 2023-01-01 MED ORDER — DEXMEDETOMIDINE HCL IN NACL 400 MCG/100ML IV SOLN
0.1000 ug/kg/h | INTRAVENOUS | Status: AC
  Administered 2023-01-02: .3 ug/kg/h via INTRAVENOUS
  Filled 2023-01-01 (×2): qty 100

## 2023-01-01 MED ORDER — VANCOMYCIN HCL 1500 MG/300ML IV SOLN
1500.0000 mg | INTRAVENOUS | Status: AC
  Administered 2023-01-02: 1500 mg via INTRAVENOUS
  Filled 2023-01-01: qty 300

## 2023-01-01 MED ORDER — NITROGLYCERIN IN D5W 200-5 MCG/ML-% IV SOLN
2.0000 ug/min | INTRAVENOUS | Status: DC
  Filled 2023-01-01: qty 250

## 2023-01-01 MED ORDER — CEFAZOLIN IN SODIUM CHLORIDE 3-0.9 GM/100ML-% IV SOLN
3.0000 g | INTRAVENOUS | Status: AC
  Administered 2023-01-02: 3 g via INTRAVENOUS
  Filled 2023-01-01: qty 100

## 2023-01-01 NOTE — H&P (Signed)
[address]       [place] [address]             [phone]      Cardiothoracic Surgery Admission History and Physical   PCP is Rudd, Bertram Millard, MD Referring Provider is Orbie Pyo, MD       Chief Complaint  Patient presents with   Coronary Artery Disease           HPI:   The patient is a 67 year old gentleman with a history of hypertension, hyperlipidemia, prediabetes, obesity, sleep apnea, and mitral valve prolapse.  He has at least a 1 year history of of dyspnea on exertion which he thought may be due to his weight.  He has not had any chest discomfort.  He has noted onset of tiredness and exertional fatigue over the past several months.  He has had mild bilateral lower extremity swelling.  He continues to work part-time in a [employer] and is worn out at the end of his shift.  His most recent echo on 11/21/2022 showed a left ventricular ejection fraction of 55 to 60% with mild concentric LVH and grade 1 diastolic dysfunction.  There is no significant valvular abnormality.  Cardiac catheterization on the same day showed significant three-vessel coronary disease with serial 80% stenoses in the proximal and distal RCA which is a large dominant vessel.  The left circumflex had 80% proximal stenosis and 60% stenosis in the first marginal branch.  The LAD had 60% mid vessel stenosis at the takeoff of the second diagonal branch which also had about 60% proximal stenosis.  The RFR in the LAD was 0.81.  LVEDP was 14 mmHg.   He is a non-smoker but does use smokeless tobacco products.       Past Medical History:  Diagnosis Date   Acne     Arthritis     Asthma     Bipolar 1 disorder     Cancer      basal cell cancer removed from back   Cataract     Depression     Dysrhythmia      PVCs   GERD (gastroesophageal reflux disease)     Heart murmur     Hepatitis      remote hx Hepatitis A   Hyperlipidemia     Hypertension     Mitral valve prolapse      Panic attacks     PFO (patent foramen ovale)      ? small PFO per echo   Pre-diabetes     Sleep apnea             Past Surgical History:  Procedure Laterality Date   CORONARY PRESSURE/FFR STUDY N/A 11/21/2022    Procedure: INTRAVASCULAR PRESSURE WIRE/FFR STUDY;  Surgeon: Orbie Pyo, MD;  Location: [facility];  Service: Cardiovascular;  Laterality: N/A;   HYDROCELE EXCISION Bilateral 03/22/2022    Procedure: HYDROCELECTOMY ADULT;  Surgeon: Noel Christmas, MD;  Location: [facility];  Service: Urology;  Laterality: Bilateral;  2 HRS   LEFT HEART CATH AND CORONARY ANGIOGRAPHY N/A 11/21/2022    Procedure: LEFT HEART CATH AND CORONARY ANGIOGRAPHY;  Surgeon: Orbie Pyo, MD;  Location: [facility];  Service: Cardiovascular;  Laterality: N/A;   MOHS SURGERY   1990   STRABISMUS SURGERY Left     TONSILLECTOMY AND ADENOIDECTOMY       WISDOM TOOTH EXTRACTION  Family History  Problem Relation Age of Onset   Heart disease Mother     Diabetes Mother     Obesity Mother     Hyperlipidemia Mother     Cancer Father          skin cancer   Heart disease Father     Hyperlipidemia Father     Colon cancer Neg Hx     Esophageal cancer Neg Hx     Rectal cancer Neg Hx     Stomach cancer Neg Hx        Social History Social History         Tobacco Use   Smoking status: Never   Smokeless tobacco: Current      Types: Snuff  Vaping Use   Vaping Use: Never used  Substance Use Topics   Alcohol use: Yes      Comment: occasionally   Drug use: Never            Current Outpatient Medications  Medication Sig Dispense Refill   acetaminophen (TYLENOL) 500 MG tablet Take 500 mg by mouth every morning.       albuterol (VENTOLIN HFA) 108 (90 Base) MCG/ACT inhaler Inhale 1 puff into the lungs every 6 (six) hours as needed for wheezing or shortness of breath.       amLODipine (NORVASC) 5 MG tablet Take 1 tablet (5 mg total) by mouth daily. (Patient taking differently:  Take 5 mg by mouth at bedtime.) 90 tablet 3   APPLE CIDER VINEGAR PO Take 15 mLs by mouth every Monday, Wednesday, and Friday.       Ascorbic Acid (VITAMIN C) 1000 MG tablet Take 3,000 mg by mouth daily.       aspirin EC 81 MG tablet Take 1 tablet (81 mg total) by mouth daily. Swallow whole. 30 tablet 12   calcium elemental as carbonate (TUMS ULTRA 1000) 400 MG chewable tablet Chew 2,000 mg by mouth 3 (three) times daily as needed for heartburn.       Cholecalciferol (DIALYVITE VITAMIN D 5000) 125 MCG (5000 UT) capsule Take 20,000 Units by mouth daily.       Coenzyme Q10 (COQ-10) 100 MG CAPS Take 100 mg by mouth daily.       Flaxseed, Linseed, (FLAX SEED OIL) 1000 MG CAPS Take 1,000 mg by mouth daily.       folic acid (FOLVITE) 800 MCG tablet Take 800 mcg by mouth daily.       hydrochlorothiazide (HYDRODIURIL) 25 MG tablet Take 1 tablet (25 mg total) by mouth daily. 90 tablet 2   lamoTRIgine (LAMICTAL) 100 MG tablet Take 100 mg by mouth daily.       Liniments (BLUE-EMU SUPER STRENGTH EX) Apply 1 Application topically daily as needed (pain).       magnesium oxide (MAG-OX) 400 MG tablet Take 400 mg by mouth daily.       Multiple Vitamins-Minerals (MULTIVITAMIN WITH MINERALS) tablet Take 1 tablet by mouth daily.       Omega 3 1000 MG CAPS Take 1,000 mg by mouth daily.       OVER THE COUNTER MEDICATION Nano CBD OIL: Pt takes 20 drops in the morning and 20 drops in the evening.       rosuvastatin (CRESTOR) 10 MG tablet Take 1 tablet (10 mg total) by mouth daily. 90 tablet 3   sildenafil (VIAGRA) 50 MG tablet Take 50 mg by mouth daily as needed for erectile dysfunction.  valsartan (DIOVAN) 320 MG tablet Take 1 tablet (320 mg total) by mouth daily. 90 tablet 3   zinc gluconate 50 MG tablet Take 50 mg by mouth daily.        No current facility-administered medications for this visit.          Allergies  Allergen Reactions   Latex Dermatitis      Review of Systems  Constitutional:   Positive for activity change and fatigue.  HENT:  Positive for hearing loss.   Eyes:        Floaters  Respiratory:  Positive for cough and shortness of breath.   Cardiovascular:  Positive for palpitations and leg swelling. Negative for chest pain.  Gastrointestinal:        Hiatal hernia and heartburn  Endocrine: Negative.   Genitourinary:  Positive for frequency.  Musculoskeletal:  Positive for arthralgias.  Skin: Negative.   Allergic/Immunologic: Negative.   Neurological:  Positive for dizziness. Negative for syncope.  Hematological: Negative.   Psychiatric/Behavioral: Negative.        BP 137/72 (BP Location: Left Arm, Patient Position: Sitting)   Pulse (!) 104   Resp 20   Ht 5\' 11"  (1.803 m)   Wt (!) 320 lb (145.2 kg)   SpO2 95% Comment: RA  BMI 44.63 kg/m  Physical Exam Constitutional:      Appearance: Normal appearance. He is obese.  HENT:     Head: Normocephalic and atraumatic.  Eyes:     Extraocular Movements: Extraocular movements intact.     Pupils: Pupils are equal, round, and reactive to light.  Neck:     Vascular: No carotid bruit.  Cardiovascular:     Rate and Rhythm: Normal rate and regular rhythm.     Pulses: Normal pulses.     Heart sounds: Normal heart sounds. No murmur heard. Pulmonary:     Effort: Pulmonary effort is normal.     Breath sounds: Normal breath sounds.  Abdominal:     Comments: Obese  Musculoskeletal:        General: Swelling present.  Skin:    General: Skin is warm and dry.  Neurological:     General: No focal deficit present.     Mental Status: He is alert and oriented to person, place, and time.  Psychiatric:        Mood and Affect: Mood normal.        Behavior: Behavior normal.          Diagnostic Tests:   Physicians   Panel Physicians Referring Physician Case Authorizing Physician  Orbie Pyo, MD (Primary)        Procedures   INTRAVASCULAR PRESSURE WIRE/FFR STUDY  LEFT HEART CATH AND CORONARY ANGIOGRAPHY     Conclusion       Prox Cx lesion is 80% stenosed.   1st Mrg lesion is 60% stenosed.   Mid LAD lesion is 60% stenosed.   2nd Diag lesion is 60% stenosed.   Prox RCA-1 lesion is 80% stenosed.   Prox RCA-2 lesion is 80% stenosed.   Dist RCA lesion is 80% stenosed.   1.  Multivessel disease consisting of serial right coronary artery lesions, high-grade left circumflex disease, RFR positive LAD disease, and moderate second diagonal disease. 2.  LVEDP of .   Recommendation: Outpatient cardiothoracic surgical evaluation.   Indications   Coronary artery disease involving native coronary artery of native heart with other form of angina pectoris (HCC) [Z61.096 (ICD-10-CM)]    Procedural Details  Technical Details The patient is a 67 year old male with a history of chronic kidney disease, mitral valve prolapse, and a BMI of 45 who was seen in the outpatient setting due to dyspnea.  Lexiscan demonstrated ejection fraction of 45% with no overt ischemia.  He underwent a coronary CTA which suggested multivessel disease with significant lesions being in the right coronary artery, second diagonal, and left circumflex.  Due to ongoing symptoms despite 2 antianginal agents he was referred for an invasive assessment.  After obtaining consent the patient brought to the cardiac catheterization laboratory and prepped draped sterile fashion.  Xylocaine was used to anesthetize the right wrist and a 6 French femoral glide sheath placed there.  5000 units heparin and 5 mg of verapamil were administered through the sheath.  A 6 [place] JR 4 diagnostic catheter and 6 [place] JL 4 diagnostic catheter were used for selective coronary angiography.  The JR4 catheter was used for left heart catheterization and LVEDP assessment.  After review of the angiogram we elected to pursue an RFR assessment of the LAD.  If this was significant this was suggest multivessel disease that would be best treated with surgery.  If  not then PCI of the right coronary artery and distal left circumflex with medical treatment of the ostial second diagonal would be pursued.  After heparinizing to an ACT of greater than 250.  A 6 French EBU 3.5 guiding catheter was used to cannulate the left main.  Pressure wire was advanced to the guide tip, the guide was vigorously flushed, and the pressure wire was advanced to the distal LAD.  An RFR was then assessed and found to be 0.81.  Therefore further interventions were deferred.  A TR band was placed.  There were no acute complications. Estimated blood loss <50 mL.   During this procedure medications were administered to achieve and maintain moderate conscious sedation while the patient's heart rate, blood pressure, and oxygen saturation were continuously monitored and I was present face-to-face 100% of this time.    Medications (Filter: Administrations occurring from 1029 to 1151 on 11/21/22) Heparin (Porcine) in NaCl 1000-0.9 UT/500ML-% SOLN (mL)  Total volume: 1,000 mL Date/Time Rate/Dose/Volume Action    11/21/22 1034 500 mL Given    1034 500 mL Given    midazolam (VERSED) injection (mg)  Total dose: 2 mg Date/Time Rate/Dose/Volume Action    11/21/22 1039 1 mg Given    1054 1 mg Given    fentaNYL (SUBLIMAZE) injection (mcg)  Total dose: 100 mcg Date/Time Rate/Dose/Volume Action    11/21/22 1039 50 mcg Given    1054 50 mcg Given    lidocaine (PF) (XYLOCAINE) 1 % injection (mL)  Total volume: 2 mL Date/Time Rate/Dose/Volume Action    11/21/22 1056 2 mL Given    Radial Cocktail (Verapamil 5 mg, NTG, Lidocaine) (mL)  Total volume: 15 mL Date/Time Rate/Dose/Volume Action    11/21/22 1056 15 mL Given    heparin sodium (porcine) injection (Units)  Total dose: 15,000 Units Date/Time Rate/Dose/Volume Action    11/21/22 1100 5,000 Units Given    1120 10,000 Units Given    iohexol (OMNIPAQUE) 350 MG/ML injection (mL)  Total volume: 70 mL Date/Time Rate/Dose/Volume Action     11/21/22 1139 70 mL Given      Sedation Time   Sedation Time Physician-1: 53 minutes 30 seconds Contrast        Administrations occurring from 1029 to 1151 on 11/21/22:  Medication Name Total Dose  iohexol (OMNIPAQUE)  350 MG/ML injection 70 mL    Radiation/Fluoro   Fluoro time: 11.9 (min) DAP: 43807 (mGycm2) Cumulative Air Kerma: 840 (mGy) Complications   Complications documented before study signed (11/21/2022 11:51 AM)    No complications were associated with this study.  Documented by Meryl Crutch, EMT - 11/21/2022 10:36 AM      Coronary Findings   Diagnostic Dominance: Right Left Anterior Descending  Mid LAD lesion is 60% stenosed.    Second Diagonal Branch  2nd Diag lesion is 60% stenosed.    Left Circumflex  There is mild diffuse disease throughout the vessel.  Prox Cx lesion is 80% stenosed.    First Obtuse Marginal Branch  1st Mrg lesion is 60% stenosed.    Right Coronary Artery  There is mild diffuse disease throughout the vessel.  Prox RCA-1 lesion is 80% stenosed.  Prox RCA-2 lesion is 80% stenosed.  Dist RCA lesion is 80% stenosed.    Intervention    No interventions have been documented.    Coronary Diagrams   Diagnostic Dominance: Right  Intervention    Implants    No implant documentation for this case.    [employer]ng[employer] Images    Show images for CARDIAC CATHETERIZATION Images on Long Term Storage    Show images for Maahir, [place] to Procedure Log   Procedure Log    Hemo Data   Flowsheet Row Most Recent Value  AO Systolic Pressure 118 mmHg  AO Diastolic Pressure 68 mmHg  AO Mean 88 mmHg  LV Systolic Pressure 117 mmHg  LV Diastolic Pressure 6 mmHg  LV EDP 14 mmHg  AOp Systolic Pressure 113 mmHg  AOp Diastolic Pressure 72 mmHg  AOp Mean Pressure 90 mmHg  LVp Systolic Pressure 117 mmHg  LVp Diastolic Pressure 5 mmHg  LVp EDP Pressure 13 mmHg      ECHOCARDIOGRAM REPORT       Patient Name:   Bryan Brock Date  of Exam: 11/21/2022  Medical Rec #:  [id number]     Height:       71.0 in  Accession #:    [id number]    Weight:       325.0 lb  Date of Birth:  1956/04/01     BSA:          2.592 m  Patient Age:    64 years      BP:           130/78 mmHg  Patient Gender: M             HR:           75 bpm.  Exam Location:  Inpatient   Procedure: 2D Echo, Cardiac Doppler and Color Doppler   Indications:    CAD    History:        Patient has prior history of Echocardiogram examinations,  most                 recent 12/14/2021. Risk Factors:Hypertension, Sleep Apnea,                  Dyslipidemia and Non-Smoker.    Sonographer:    Dondra Prader RVT RCS  Referring Phys: [id number] Corrin Parker     Sonographer Comments: No subcostal window, suboptimal parasternal window,  suboptimal apical window and Technically challenging study due to limited  acoustic windows. Supine.  IMPRESSIONS     1. Left ventricular ejection fraction, by estimation, is 55 to  60%. The  left ventricle has normal function. The left ventricle has no regional  wall motion abnormalities. There is mild concentric left ventricular  hypertrophy. Left ventricular diastolic  parameters are consistent with Grade I diastolic dysfunction (impaired  relaxation). Elevated left atrial pressure.   2. Right ventricular systolic function is normal. The right ventricular  size is normal.   3. Left atrial size was moderately dilated.   4. The mitral valve is normal in structure. No evidence of mitral valve  regurgitation.   5. The aortic valve is tricuspid. Aortic valve regurgitation is not  visualized. No aortic stenosis is present.   FINDINGS   Left Ventricle: Left ventricular ejection fraction, by estimation, is 55  to 60%. The left ventricle has normal function. The left ventricle has no  regional wall motion abnormalities. The left ventricular internal cavity  size was normal in size. There is   mild concentric left ventricular  hypertrophy. Left ventricular diastolic  parameters are consistent with Grade I diastolic dysfunction (impaired  relaxation). Elevated left atrial pressure.   Right Ventricle: The right ventricular size is normal. No increase in  right ventricular wall thickness. Right ventricular systolic function is  normal.   Left Atrium: Left atrial size was moderately dilated.   Right Atrium: Right atrial size was normal in size.   Pericardium: There is no evidence of pericardial effusion.   Mitral Valve: The mitral valve is normal in structure. No evidence of  mitral valve regurgitation.   Tricuspid Valve: The tricuspid valve is normal in structure. Tricuspid  valve regurgitation is not demonstrated.   Aortic Valve: The aortic valve is tricuspid. Aortic valve regurgitation is  not visualized. No aortic stenosis is present. Aortic valve mean gradient  measures 3.0 mmHg. Aortic valve peak gradient measures 4.9 mmHg. Aortic  valve area, by VTI measures 3.00  cm.   Pulmonic Valve: The pulmonic valve was grossly normal. Pulmonic valve  regurgitation is not visualized.   Aorta: The aortic root is normal in size and structure.   IAS/Shunts: No atrial level shunt detected by color flow Doppler.     LEFT VENTRICLE  PLAX 2D  LVIDd:         5.10 cm   Diastology  LVIDs:         3.20 cm   LV e' medial:    9.20 cm/s  LV PW:         1.20 cm   LV E/e' medial:  6.6  LV IVS:        1.30 cm   LV e' lateral:   11.20 cm/s  LVOT diam:     2.20 cm   LV E/e' lateral: 5.4  LV SV:         62  LV SV Index:   24  LVOT Area:     3.80 cm     RIGHT VENTRICLE  RV Basal diam:  3.80 cm  RV S prime:     16.60 cm/s  TAPSE (M-mode): 2.4 cm   LEFT ATRIUM             Index        RIGHT ATRIUM           Index  LA diam:        4.60 cm 1.77 cm/m   RA Area:     13.60 cm  LA Vol (A2C):   55.0 ml 21.22 ml/m  RA Volume:   29.70 ml  11.46 ml/m  LA Vol (A4C):   61.4 ml 23.67 ml/m  LA Biplane Vol: 57.4 ml 22.15  ml/m   AORTIC VALVE                    PULMONIC VALVE  AV Area (Vmax):    3.25 cm     PV Vmax:       0.95 m/s  AV Area (Vmean):   3.23 cm     PV Peak grad:  3.6 mmHg  AV Area (VTI):     3.00 cm  AV Vmax:           111.00 cm/s  AV Vmean:          73.400 cm/s  AV VTI:            0.208 m  AV Peak Grad:      4.9 mmHg  AV Mean Grad:      3.0 mmHg  LVOT Vmax:         94.80 cm/s  LVOT Vmean:        62.400 cm/s  LVOT VTI:          0.164 m  LVOT/AV VTI ratio: 0.79    AORTA  Ao Root diam: 3.30 cm  Ao Arch diam: 3.4 cm   MITRAL VALVE  MV Area (PHT): 2.46 cm    SHUNTS  MV Decel Time: 309 msec    Systemic VTI:  0.16 m  MV E velocity: 60.35 cm/s  Systemic Diam: 2.20 cm  MV A velocity: 76.75 cm/s  MV E/A ratio:  0.79   Mihai Croitoru MD  Electronically signed by Thurmon Fair MD  Signature Date/Time: 11/21/2022/3:32:43 PM        Final          Impression:   This 67 year old gentleman has severe three-vessel coronary disease with coronary CT FFR data consistent with hemodynamically significant stenosis in the RCA, left circumflex, and second diagonal branch and RFR positive LAD disease on catheterization.  Echocardiogram shows normal left ventricular systolic function with no significant valvular abnormality.  I agree that coronary bypass graft surgery is the best treatment for resolution of his exertional fatigue and shortness of breath and to preserve myocardium. I discussed the operative procedure with the patient and and his significant other including alternatives, benefits and risks; including but not limited to bleeding, blood transfusion, infection, stroke, myocardial infarction, graft failure, heart block requiring a permanent pacemaker, organ dysfunction, and death.  [name] understands and agrees to proceed.     Plan:   Coronary bypass surgery.    [name], MD [facility] [phone]

## 2023-01-02 ENCOUNTER — Inpatient Hospital Stay (HOSPITAL_COMMUNITY): Payer: Medicare Other

## 2023-01-02 ENCOUNTER — Encounter (HOSPITAL_COMMUNITY): Payer: Self-pay | Admitting: Surgery

## 2023-01-02 ENCOUNTER — Other Ambulatory Visit: Payer: Self-pay

## 2023-01-02 ENCOUNTER — Inpatient Hospital Stay (HOSPITAL_COMMUNITY)
Admission: RE | Disposition: A | Discharge status: Still In Hospital | Payer: Self-pay | Source: Ambulatory Visit | Attending: Surgery

## 2023-01-02 ENCOUNTER — Inpatient Hospital Stay (HOSPITAL_COMMUNITY): Payer: Medicare Other | Admitting: Physician Assistant

## 2023-01-02 ENCOUNTER — Inpatient Hospital Stay (HOSPITAL_COMMUNITY)
Admission: RE | Admit: 2023-01-02 | Discharge: 2023-02-04 | DRG: 235 | Disposition: A | Discharge status: Rehabilitation Facility | Payer: Medicare Other | Source: Ambulatory Visit | Attending: Surgery | Admitting: Surgery

## 2023-01-02 DIAGNOSIS — Z85828 Personal history of other malignant neoplasm of skin: Secondary | ICD-10-CM

## 2023-01-02 DIAGNOSIS — N179 Acute kidney failure, unspecified: Secondary | ICD-10-CM | POA: Diagnosis not present

## 2023-01-02 DIAGNOSIS — D75839 Thrombocytosis, unspecified: Secondary | ICD-10-CM | POA: Diagnosis not present

## 2023-01-02 DIAGNOSIS — Z8349 Family history of other endocrine, nutritional and metabolic diseases: Secondary | ICD-10-CM

## 2023-01-02 DIAGNOSIS — I2510 Atherosclerotic heart disease of native coronary artery without angina pectoris: Secondary | ICD-10-CM | POA: Diagnosis not present

## 2023-01-02 DIAGNOSIS — J45909 Unspecified asthma, uncomplicated: Secondary | ICD-10-CM | POA: Diagnosis present

## 2023-01-02 DIAGNOSIS — Z79899 Other long term (current) drug therapy: Secondary | ICD-10-CM

## 2023-01-02 DIAGNOSIS — Z951 Presence of aortocoronary bypass graft: Secondary | ICD-10-CM | POA: Diagnosis not present

## 2023-01-02 DIAGNOSIS — I739 Peripheral vascular disease, unspecified: Secondary | ICD-10-CM | POA: Diagnosis not present

## 2023-01-02 DIAGNOSIS — Z8249 Family history of ischemic heart disease and other diseases of the circulatory system: Secondary | ICD-10-CM

## 2023-01-02 DIAGNOSIS — I10 Essential (primary) hypertension: Secondary | ICD-10-CM | POA: Diagnosis present

## 2023-01-02 DIAGNOSIS — T8130XA Disruption of wound, unspecified, initial encounter: Secondary | ICD-10-CM | POA: Diagnosis not present

## 2023-01-02 DIAGNOSIS — J9601 Acute respiratory failure with hypoxia: Secondary | ICD-10-CM | POA: Diagnosis not present

## 2023-01-02 DIAGNOSIS — Z1152 Encounter for screening for COVID-19: Secondary | ICD-10-CM

## 2023-01-02 DIAGNOSIS — E875 Hyperkalemia: Secondary | ICD-10-CM | POA: Diagnosis not present

## 2023-01-02 DIAGNOSIS — R0789 Other chest pain: Secondary | ICD-10-CM | POA: Diagnosis not present

## 2023-01-02 DIAGNOSIS — S21109A Unspecified open wound of unspecified front wall of thorax without penetration into thoracic cavity, initial encounter: Secondary | ICD-10-CM | POA: Diagnosis not present

## 2023-01-02 DIAGNOSIS — J9811 Atelectasis: Secondary | ICD-10-CM

## 2023-01-02 DIAGNOSIS — I25810 Atherosclerosis of coronary artery bypass graft(s) without angina pectoris: Secondary | ICD-10-CM | POA: Diagnosis not present

## 2023-01-02 DIAGNOSIS — Z8619 Personal history of other infectious and parasitic diseases: Secondary | ICD-10-CM | POA: Diagnosis not present

## 2023-01-02 DIAGNOSIS — Z888 Allergy status to other drugs, medicaments and biological substances status: Secondary | ICD-10-CM

## 2023-01-02 DIAGNOSIS — F17290 Nicotine dependence, other tobacco product, uncomplicated: Secondary | ICD-10-CM | POA: Diagnosis present

## 2023-01-02 DIAGNOSIS — I9789 Other postprocedural complications and disorders of the circulatory system, not elsewhere classified: Secondary | ICD-10-CM | POA: Diagnosis not present

## 2023-01-02 DIAGNOSIS — R011 Cardiac murmur, unspecified: Secondary | ICD-10-CM | POA: Diagnosis present

## 2023-01-02 DIAGNOSIS — T8132XA Disruption of internal operation (surgical) wound, not elsewhere classified, initial encounter: Secondary | ICD-10-CM | POA: Diagnosis not present

## 2023-01-02 DIAGNOSIS — D696 Thrombocytopenia, unspecified: Secondary | ICD-10-CM | POA: Diagnosis not present

## 2023-01-02 DIAGNOSIS — J95811 Postprocedural pneumothorax: Secondary | ICD-10-CM

## 2023-01-02 DIAGNOSIS — F909 Attention-deficit hyperactivity disorder, unspecified type: Secondary | ICD-10-CM | POA: Diagnosis present

## 2023-01-02 DIAGNOSIS — E1151 Type 2 diabetes mellitus with diabetic peripheral angiopathy without gangrene: Secondary | ICD-10-CM | POA: Diagnosis present

## 2023-01-02 DIAGNOSIS — E1140 Type 2 diabetes mellitus with diabetic neuropathy, unspecified: Secondary | ICD-10-CM | POA: Diagnosis present

## 2023-01-02 DIAGNOSIS — T81328A Disruption or dehiscence of closure of other specified internal operation (surgical) wound, initial encounter: Secondary | ICD-10-CM | POA: Diagnosis present

## 2023-01-02 DIAGNOSIS — I25708 Atherosclerosis of coronary artery bypass graft(s), unspecified, with other forms of angina pectoris: Secondary | ICD-10-CM | POA: Diagnosis not present

## 2023-01-02 DIAGNOSIS — I4891 Unspecified atrial fibrillation: Secondary | ICD-10-CM | POA: Diagnosis not present

## 2023-01-02 DIAGNOSIS — K219 Gastro-esophageal reflux disease without esophagitis: Secondary | ICD-10-CM | POA: Diagnosis present

## 2023-01-02 DIAGNOSIS — I341 Nonrheumatic mitral (valve) prolapse: Secondary | ICD-10-CM | POA: Diagnosis present

## 2023-01-02 DIAGNOSIS — E1165 Type 2 diabetes mellitus with hyperglycemia: Secondary | ICD-10-CM | POA: Diagnosis present

## 2023-01-02 DIAGNOSIS — E785 Hyperlipidemia, unspecified: Secondary | ICD-10-CM | POA: Diagnosis present

## 2023-01-02 DIAGNOSIS — I119 Hypertensive heart disease without heart failure: Secondary | ICD-10-CM | POA: Diagnosis not present

## 2023-01-02 DIAGNOSIS — I97190 Other postprocedural cardiac functional disturbances following cardiac surgery: Secondary | ICD-10-CM | POA: Diagnosis not present

## 2023-01-02 DIAGNOSIS — F419 Anxiety disorder, unspecified: Secondary | ICD-10-CM | POA: Diagnosis present

## 2023-01-02 DIAGNOSIS — Y832 Surgical operation with anastomosis, bypass or graft as the cause of abnormal reaction of the patient, or of later complication, without mention of misadventure at the time of the procedure: Secondary | ICD-10-CM | POA: Diagnosis not present

## 2023-01-02 DIAGNOSIS — F418 Other specified anxiety disorders: Secondary | ICD-10-CM | POA: Diagnosis not present

## 2023-01-02 DIAGNOSIS — Z833 Family history of diabetes mellitus: Secondary | ICD-10-CM

## 2023-01-02 DIAGNOSIS — M1990 Unspecified osteoarthritis, unspecified site: Secondary | ICD-10-CM | POA: Diagnosis present

## 2023-01-02 DIAGNOSIS — E871 Hypo-osmolality and hyponatremia: Secondary | ICD-10-CM | POA: Diagnosis present

## 2023-01-02 DIAGNOSIS — F410 Panic disorder [episodic paroxysmal anxiety] without agoraphobia: Secondary | ICD-10-CM | POA: Diagnosis present

## 2023-01-02 DIAGNOSIS — Z6841 Body Mass Index (BMI) 40.0 and over, adult: Secondary | ICD-10-CM | POA: Diagnosis not present

## 2023-01-02 DIAGNOSIS — T8131XA Disruption of external operation (surgical) wound, not elsewhere classified, initial encounter: Secondary | ICD-10-CM | POA: Diagnosis not present

## 2023-01-02 DIAGNOSIS — D62 Acute posthemorrhagic anemia: Secondary | ICD-10-CM | POA: Diagnosis present

## 2023-01-02 DIAGNOSIS — F319 Bipolar disorder, unspecified: Secondary | ICD-10-CM | POA: Diagnosis not present

## 2023-01-02 DIAGNOSIS — R0600 Dyspnea, unspecified: Secondary | ICD-10-CM | POA: Diagnosis present

## 2023-01-02 DIAGNOSIS — G4733 Obstructive sleep apnea (adult) (pediatric): Secondary | ICD-10-CM | POA: Diagnosis present

## 2023-01-02 DIAGNOSIS — E876 Hypokalemia: Secondary | ICD-10-CM | POA: Diagnosis not present

## 2023-01-02 DIAGNOSIS — R9439 Abnormal result of other cardiovascular function study: Secondary | ICD-10-CM

## 2023-01-02 DIAGNOSIS — T8149XA Infection following a procedure, other surgical site, initial encounter: Secondary | ICD-10-CM | POA: Diagnosis not present

## 2023-01-02 DIAGNOSIS — Z794 Long term (current) use of insulin: Secondary | ICD-10-CM | POA: Diagnosis not present

## 2023-01-02 DIAGNOSIS — G4730 Sleep apnea, unspecified: Secondary | ICD-10-CM | POA: Diagnosis present

## 2023-01-02 DIAGNOSIS — R5381 Other malaise: Secondary | ICD-10-CM | POA: Diagnosis present

## 2023-01-02 DIAGNOSIS — K5900 Constipation, unspecified: Secondary | ICD-10-CM | POA: Diagnosis not present

## 2023-01-02 DIAGNOSIS — J90 Pleural effusion, not elsewhere classified: Secondary | ICD-10-CM

## 2023-01-02 DIAGNOSIS — E8770 Fluid overload, unspecified: Secondary | ICD-10-CM | POA: Diagnosis not present

## 2023-01-02 DIAGNOSIS — T84418A Breakdown (mechanical) of other internal orthopedic devices, implants and grafts, initial encounter: Secondary | ICD-10-CM | POA: Diagnosis not present

## 2023-01-02 DIAGNOSIS — K5903 Drug induced constipation: Secondary | ICD-10-CM | POA: Diagnosis not present

## 2023-01-02 DIAGNOSIS — Z91040 Latex allergy status: Secondary | ICD-10-CM

## 2023-01-02 DIAGNOSIS — D649 Anemia, unspecified: Secondary | ICD-10-CM | POA: Diagnosis not present

## 2023-01-02 DIAGNOSIS — Z7982 Long term (current) use of aspirin: Secondary | ICD-10-CM

## 2023-01-02 DIAGNOSIS — E119 Type 2 diabetes mellitus without complications: Secondary | ICD-10-CM | POA: Diagnosis not present

## 2023-01-02 DIAGNOSIS — Z808 Family history of malignant neoplasm of other organs or systems: Secondary | ICD-10-CM

## 2023-01-02 DIAGNOSIS — Q2112 Patent foramen ovale: Secondary | ICD-10-CM | POA: Diagnosis not present

## 2023-01-02 DIAGNOSIS — T502X5A Adverse effect of carbonic-anhydrase inhibitors, benzothiadiazides and other diuretics, initial encounter: Secondary | ICD-10-CM | POA: Diagnosis not present

## 2023-01-02 HISTORY — PX: TEE WITHOUT CARDIOVERSION: SHX5443

## 2023-01-02 HISTORY — PX: CORONARY ARTERY BYPASS GRAFT: SHX141

## 2023-01-02 LAB — POCT I-STAT 7, (LYTES, BLD GAS, ICA,H+H)
Acid-base deficit: 3 mmol/L — ABNORMAL HIGH (ref 0.0–2.0)
Acid-base deficit: 2 mmol/L (ref 0.0–2.0)
Acid-base deficit: 3 mmol/L — ABNORMAL HIGH (ref 0.0–2.0)
Acid-base deficit: 3 mmol/L — ABNORMAL HIGH (ref 0.0–2.0)
Acid-base deficit: 3 mmol/L — ABNORMAL HIGH (ref 0.0–2.0)
Acid-base deficit: 3 mmol/L — ABNORMAL HIGH (ref 0.0–2.0)
Bicarbonate: 24.2 mmol/L (ref 20.0–28.0)
Bicarbonate: 24.6 mmol/L (ref 20.0–28.0)
Bicarbonate: 22.3 mmol/L (ref 20.0–28.0)
Bicarbonate: 23 mmol/L (ref 20.0–28.0)
Bicarbonate: 24 mmol/L (ref 20.0–28.0)
Bicarbonate: 23.4 mmol/L (ref 20.0–28.0)
Calcium, Ion: 1.48 mmol/L — ABNORMAL HIGH (ref 1.15–1.40)
Calcium, Ion: 1.24 mmol/L (ref 1.15–1.40)
Calcium, Ion: 1.24 mmol/L (ref 1.15–1.40)
Calcium, Ion: 1.26 mmol/L (ref 1.15–1.40)
Calcium, Ion: 1.31 mmol/L (ref 1.15–1.40)
Calcium, Ion: 1.28 mmol/L (ref 1.15–1.40)
HCT: 40 % (ref 39.0–52.0)
HCT: 31 % — ABNORMAL LOW (ref 39.0–52.0)
HCT: 32 % — ABNORMAL LOW (ref 39.0–52.0)
HCT: 34 % — ABNORMAL LOW (ref 39.0–52.0)
HCT: 37 % — ABNORMAL LOW (ref 39.0–52.0)
HCT: 35 % — ABNORMAL LOW (ref 39.0–52.0)
Hemoglobin: 13.6 g/dL (ref 13.0–17.0)
Hemoglobin: 10.5 g/dL — ABNORMAL LOW (ref 13.0–17.0)
Hemoglobin: 10.9 g/dL — ABNORMAL LOW (ref 13.0–17.0)
Hemoglobin: 11.6 g/dL — ABNORMAL LOW (ref 13.0–17.0)
Hemoglobin: 12.6 g/dL — ABNORMAL LOW (ref 13.0–17.0)
Hemoglobin: 11.9 g/dL — ABNORMAL LOW (ref 13.0–17.0)
O2 Saturation: 98 %
O2 Saturation: 100 %
O2 Saturation: 100 %
O2 Saturation: 92 %
O2 Saturation: 95 %
O2 Saturation: 94 %
Patient temperature: 37.2
Patient temperature: 37.3
Potassium: 4.3 mmol/L (ref 3.5–5.1)
Potassium: 5.8 mmol/L — ABNORMAL HIGH (ref 3.5–5.1)
Potassium: 5.7 mmol/L — ABNORMAL HIGH (ref 3.5–5.1)
Potassium: 5 mmol/L (ref 3.5–5.1)
Potassium: 4.5 mmol/L (ref 3.5–5.1)
Potassium: 4.7 mmol/L (ref 3.5–5.1)
Sodium: 137 mmol/L (ref 135–145)
Sodium: 134 mmol/L — ABNORMAL LOW (ref 135–145)
Sodium: 134 mmol/L — ABNORMAL LOW (ref 135–145)
Sodium: 136 mmol/L (ref 135–145)
Sodium: 137 mmol/L (ref 135–145)
Sodium: 138 mmol/L (ref 135–145)
TCO2: 26 mmol/L (ref 22–32)
TCO2: 26 mmol/L (ref 22–32)
TCO2: 23 mmol/L (ref 22–32)
TCO2: 24 mmol/L (ref 22–32)
TCO2: 26 mmol/L (ref 22–32)
TCO2: 25 mmol/L (ref 22–32)
pCO2 arterial: 49.4 mmHg — ABNORMAL HIGH (ref 32–48)
pCO2 arterial: 50.7 mmHg — ABNORMAL HIGH (ref 32–48)
pCO2 arterial: 39.9 mmHg (ref 32–48)
pCO2 arterial: 43.3 mmHg (ref 32–48)
pCO2 arterial: 52.1 mmHg — ABNORMAL HIGH (ref 32–48)
pCO2 arterial: 45.4 mmHg (ref 32–48)
pH, Arterial: 7.298 — ABNORMAL LOW (ref 7.35–7.45)
pH, Arterial: 7.294 — ABNORMAL LOW (ref 7.35–7.45)
pH, Arterial: 7.355 (ref 7.35–7.45)
pH, Arterial: 7.334 — ABNORMAL LOW (ref 7.35–7.45)
pH, Arterial: 7.272 — ABNORMAL LOW (ref 7.35–7.45)
pH, Arterial: 7.321 — ABNORMAL LOW (ref 7.35–7.45)
pO2, Arterial: 117 mmHg — ABNORMAL HIGH (ref 83–108)
pO2, Arterial: 281 mmHg — ABNORMAL HIGH (ref 83–108)
pO2, Arterial: 332 mmHg — ABNORMAL HIGH (ref 83–108)
pO2, Arterial: 67 mmHg — ABNORMAL LOW (ref 83–108)
pO2, Arterial: 89 mmHg (ref 83–108)
pO2, Arterial: 77 mmHg — ABNORMAL LOW (ref 83–108)

## 2023-01-02 LAB — POCT I-STAT EG7
Acid-base deficit: 2 mmol/L (ref 0.0–2.0)
Bicarbonate: 25 mmol/L (ref 20.0–28.0)
Calcium, Ion: 1.28 mmol/L (ref 1.15–1.40)
HCT: 33 % — ABNORMAL LOW (ref 39.0–52.0)
Hemoglobin: 11.2 g/dL — ABNORMAL LOW (ref 13.0–17.0)
O2 Saturation: 82 %
Potassium: 5.3 mmol/L — ABNORMAL HIGH (ref 3.5–5.1)
Sodium: 135 mmol/L (ref 135–145)
TCO2: 27 mmol/L (ref 22–32)
pCO2, Ven: 53 mmHg (ref 44–60)
pH, Ven: 7.282 (ref 7.25–7.43)
pO2, Ven: 53 mmHg — ABNORMAL HIGH (ref 32–45)

## 2023-01-02 LAB — ABO/RH: ABO/RH(D): O POS

## 2023-01-02 LAB — POCT I-STAT, CHEM 8
BUN: 21 mg/dL (ref 8–23)
BUN: 23 mg/dL (ref 8–23)
BUN: 22 mg/dL (ref 8–23)
BUN: 21 mg/dL (ref 8–23)
BUN: 22 mg/dL (ref 8–23)
BUN: 23 mg/dL (ref 8–23)
Calcium, Ion: 1.4 mmol/L (ref 1.15–1.40)
Calcium, Ion: 1.46 mmol/L — ABNORMAL HIGH (ref 1.15–1.40)
Calcium, Ion: 1.42 mmol/L — ABNORMAL HIGH (ref 1.15–1.40)
Calcium, Ion: 1.22 mmol/L (ref 1.15–1.40)
Calcium, Ion: 1.2 mmol/L (ref 1.15–1.40)
Calcium, Ion: 1.23 mmol/L (ref 1.15–1.40)
Chloride: 103 mmol/L (ref 98–111)
Chloride: 102 mmol/L (ref 98–111)
Chloride: 103 mmol/L (ref 98–111)
Chloride: 102 mmol/L (ref 98–111)
Chloride: 102 mmol/L (ref 98–111)
Chloride: 102 mmol/L (ref 98–111)
Creatinine, Ser: 1.2 mg/dL (ref 0.61–1.24)
Creatinine, Ser: 1.2 mg/dL (ref 0.61–1.24)
Creatinine, Ser: 1.2 mg/dL (ref 0.61–1.24)
Creatinine, Ser: 1.2 mg/dL (ref 0.61–1.24)
Creatinine, Ser: 1.1 mg/dL (ref 0.61–1.24)
Creatinine, Ser: 1.1 mg/dL (ref 0.61–1.24)
Glucose, Bld: 133 mg/dL — ABNORMAL HIGH (ref 70–99)
Glucose, Bld: 121 mg/dL — ABNORMAL HIGH (ref 70–99)
Glucose, Bld: 128 mg/dL — ABNORMAL HIGH (ref 70–99)
Glucose, Bld: 141 mg/dL — ABNORMAL HIGH (ref 70–99)
Glucose, Bld: 153 mg/dL — ABNORMAL HIGH (ref 70–99)
Glucose, Bld: 153 mg/dL — ABNORMAL HIGH (ref 70–99)
HCT: 41 % (ref 39.0–52.0)
HCT: 38 % — ABNORMAL LOW (ref 39.0–52.0)
HCT: 37 % — ABNORMAL LOW (ref 39.0–52.0)
HCT: 31 % — ABNORMAL LOW (ref 39.0–52.0)
HCT: 31 % — ABNORMAL LOW (ref 39.0–52.0)
HCT: 33 % — ABNORMAL LOW (ref 39.0–52.0)
Hemoglobin: 13.9 g/dL (ref 13.0–17.0)
Hemoglobin: 12.9 g/dL — ABNORMAL LOW (ref 13.0–17.0)
Hemoglobin: 12.6 g/dL — ABNORMAL LOW (ref 13.0–17.0)
Hemoglobin: 10.5 g/dL — ABNORMAL LOW (ref 13.0–17.0)
Hemoglobin: 10.5 g/dL — ABNORMAL LOW (ref 13.0–17.0)
Hemoglobin: 11.2 g/dL — ABNORMAL LOW (ref 13.0–17.0)
Potassium: 4.3 mmol/L (ref 3.5–5.1)
Potassium: 4.6 mmol/L (ref 3.5–5.1)
Potassium: 4.9 mmol/L (ref 3.5–5.1)
Potassium: 5.6 mmol/L — ABNORMAL HIGH (ref 3.5–5.1)
Potassium: 5 mmol/L (ref 3.5–5.1)
Potassium: 5.8 mmol/L — ABNORMAL HIGH (ref 3.5–5.1)
Sodium: 138 mmol/L (ref 135–145)
Sodium: 136 mmol/L (ref 135–145)
Sodium: 137 mmol/L (ref 135–145)
Sodium: 134 mmol/L — ABNORMAL LOW (ref 135–145)
Sodium: 135 mmol/L (ref 135–145)
Sodium: 137 mmol/L (ref 135–145)
TCO2: 23 mmol/L (ref 22–32)
TCO2: 26 mmol/L (ref 22–32)
TCO2: 25 mmol/L (ref 22–32)
TCO2: 25 mmol/L (ref 22–32)
TCO2: 22 mmol/L (ref 22–32)
TCO2: 25 mmol/L (ref 22–32)

## 2023-01-02 LAB — CBC
HCT: 37.4 % — ABNORMAL LOW (ref 39.0–52.0)
Hemoglobin: 12.1 g/dL — ABNORMAL LOW (ref 13.0–17.0)
MCH: 31.2 pg (ref 26.0–34.0)
MCHC: 32.4 g/dL (ref 30.0–36.0)
MCV: 96.4 fL (ref 80.0–100.0)
Platelets: 154 10*3/uL (ref 150–400)
RBC: 3.88 MIL/uL — ABNORMAL LOW (ref 4.22–5.81)
RDW: 13.1 % (ref 11.5–15.5)
WBC: 11.9 10*3/uL — ABNORMAL HIGH (ref 4.0–10.5)
nRBC: 0 % (ref 0.0–0.2)

## 2023-01-02 LAB — PROTIME-INR
INR: 1.3 — ABNORMAL HIGH (ref 0.8–1.2)
Prothrombin Time: 16.5 seconds — ABNORMAL HIGH (ref 11.4–15.2)

## 2023-01-02 LAB — HEMOGLOBIN AND HEMATOCRIT, BLOOD
HCT: 31.9 % — ABNORMAL LOW (ref 39.0–52.0)
Hemoglobin: 10.9 g/dL — ABNORMAL LOW (ref 13.0–17.0)

## 2023-01-02 LAB — ECHO INTRAOPERATIVE TEE
Height: 71 in
Weight: 5024 oz

## 2023-01-02 LAB — GLUCOSE, CAPILLARY
Glucose-Capillary: 127 mg/dL — ABNORMAL HIGH (ref 70–99)
Glucose-Capillary: 127 mg/dL — ABNORMAL HIGH (ref 70–99)
Glucose-Capillary: 135 mg/dL — ABNORMAL HIGH (ref 70–99)
Glucose-Capillary: 133 mg/dL — ABNORMAL HIGH (ref 70–99)
Glucose-Capillary: 130 mg/dL — ABNORMAL HIGH (ref 70–99)
Glucose-Capillary: 150 mg/dL — ABNORMAL HIGH (ref 70–99)
Glucose-Capillary: 143 mg/dL — ABNORMAL HIGH (ref 70–99)

## 2023-01-02 LAB — PLATELET COUNT: Platelets: 141 10*3/uL — ABNORMAL LOW (ref 150–400)

## 2023-01-02 LAB — APTT: aPTT: 32 seconds (ref 24–36)

## 2023-01-02 SURGERY — CORONARY ARTERY BYPASS GRAFTING (CABG)
Anesthesia: General | Site: Chest

## 2023-01-02 MED ORDER — MIDAZOLAM HCL 2 MG/2ML IJ SOLN
INTRAMUSCULAR | Status: AC
  Filled 2023-01-02: qty 2

## 2023-01-02 MED ORDER — ONDANSETRON HCL 4 MG/2ML IJ SOLN: 4.0000 mg | Freq: Four times a day (QID) | INTRAMUSCULAR | Status: DC | PRN

## 2023-01-02 MED ORDER — HEPARIN SODIUM (PORCINE) 1000 UNIT/ML IJ SOLN
INTRAMUSCULAR | Status: DC | PRN
  Administered 2023-01-02: 50000 [IU] via INTRAVENOUS

## 2023-01-02 MED ORDER — PROPOFOL 10 MG/ML IV BOLUS
INTRAVENOUS | Status: AC
  Filled 2023-01-02: qty 20

## 2023-01-02 MED ORDER — SODIUM CHLORIDE 0.9% FLUSH
3.0000 mL | Freq: Two times a day (BID) | INTRAVENOUS | Status: DC
  Administered 2023-01-03 – 2023-01-04 (×3): 3 mL via INTRAVENOUS

## 2023-01-02 MED ORDER — CHLORHEXIDINE GLUCONATE CLOTH 2 % EX PADS
6.0000 | MEDICATED_PAD | Freq: Every day | CUTANEOUS | Status: DC
  Administered 2023-01-02 – 2023-01-04 (×3): 6 via TOPICAL

## 2023-01-02 MED ORDER — FENTANYL CITRATE (PF) 250 MCG/5ML IJ SOLN
INTRAMUSCULAR | Status: DC | PRN
  Administered 2023-01-02: 50 ug via INTRAVENOUS
  Administered 2023-01-02: 125 ug via INTRAVENOUS
  Administered 2023-01-02: 150 ug via INTRAVENOUS
  Administered 2023-01-02: 250 ug via INTRAVENOUS
  Administered 2023-01-02: 150 ug via INTRAVENOUS
  Administered 2023-01-02: 100 ug via INTRAVENOUS
  Administered 2023-01-02: 50 ug via INTRAVENOUS
  Administered 2023-01-02: 125 ug via INTRAVENOUS
  Administered 2023-01-02: 250 ug via INTRAVENOUS
  Administered 2023-01-02: 50 ug via INTRAVENOUS
  Administered 2023-01-02 (×2): 100 ug via INTRAVENOUS

## 2023-01-02 MED ORDER — SODIUM CHLORIDE 0.9 % IV SOLN: INTRAVENOUS | Status: DC | PRN

## 2023-01-02 MED ORDER — BISACODYL 5 MG PO TBEC
10.0000 mg | DELAYED_RELEASE_TABLET | Freq: Every day | ORAL | Status: DC
  Administered 2023-01-03 – 2023-01-04 (×2): 10 mg via ORAL
  Filled 2023-01-02 (×2): qty 2

## 2023-01-02 MED ORDER — ROCURONIUM BROMIDE 10 MG/ML (PF) SYRINGE
PREFILLED_SYRINGE | INTRAVENOUS | Status: AC
  Filled 2023-01-02: qty 10

## 2023-01-02 MED ORDER — METOPROLOL TARTRATE 12.5 MG HALF TABLET
12.5000 mg | ORAL_TABLET | Freq: Two times a day (BID) | ORAL | Status: DC
  Administered 2023-01-03 – 2023-01-04 (×3): 12.5 mg via ORAL
  Filled 2023-01-02 (×3): qty 1

## 2023-01-02 MED ORDER — METOPROLOL TARTRATE 25 MG/10 ML ORAL SUSPENSION: 12.5000 mg | Freq: Two times a day (BID) | ORAL | Status: DC

## 2023-01-02 MED ORDER — SODIUM CHLORIDE 0.45 % IV SOLN: INTRAVENOUS | Status: DC | PRN

## 2023-01-02 MED ORDER — ROCURONIUM BROMIDE 10 MG/ML (PF) SYRINGE
PREFILLED_SYRINGE | INTRAVENOUS | Status: AC
  Filled 2023-01-02: qty 20

## 2023-01-02 MED ORDER — FENTANYL CITRATE (PF) 250 MCG/5ML IJ SOLN
INTRAMUSCULAR | Status: AC
  Filled 2023-01-02: qty 5

## 2023-01-02 MED ORDER — CEFAZOLIN SODIUM-DEXTROSE 2-4 GM/100ML-% IV SOLN
2.0000 g | Freq: Three times a day (TID) | INTRAVENOUS | Status: AC
  Administered 2023-01-02 – 2023-01-04 (×6): 2 g via INTRAVENOUS
  Filled 2023-01-02 (×6): qty 100

## 2023-01-02 MED ORDER — ACETAMINOPHEN 500 MG PO TABS
1000.0000 mg | ORAL_TABLET | Freq: Four times a day (QID) | ORAL | Status: DC
  Administered 2023-01-03 – 2023-01-07 (×18): 1000 mg via ORAL
  Filled 2023-01-02 (×19): qty 2

## 2023-01-02 MED ORDER — CHLORHEXIDINE GLUCONATE 0.12 % MT SOLN
15.0000 mL | Freq: Once | OROMUCOSAL | Status: DC
  Filled 2023-01-02: qty 15

## 2023-01-02 MED ORDER — LACTATED RINGERS IV SOLN: INTRAVENOUS | Status: DC | PRN

## 2023-01-02 MED ORDER — ASPIRIN 325 MG PO TBEC
325.0000 mg | DELAYED_RELEASE_TABLET | Freq: Every day | ORAL | Status: DC
  Administered 2023-01-03 – 2023-01-04 (×2): 325 mg via ORAL
  Filled 2023-01-02 (×2): qty 1

## 2023-01-02 MED ORDER — METOPROLOL TARTRATE 12.5 MG HALF TABLET
12.5000 mg | ORAL_TABLET | Freq: Once | ORAL | Status: AC
  Administered 2023-01-02: 12.5 mg via ORAL
  Filled 2023-01-02: qty 1

## 2023-01-02 MED ORDER — LACTATED RINGERS IV SOLN: 500.0000 mL | Freq: Once | INTRAVENOUS | Status: DC | PRN

## 2023-01-02 MED ORDER — PHENYLEPHRINE 80 MCG/ML (10ML) SYRINGE FOR IV PUSH (FOR BLOOD PRESSURE SUPPORT)
PREFILLED_SYRINGE | INTRAVENOUS | Status: AC
  Filled 2023-01-02: qty 10

## 2023-01-02 MED ORDER — MAGNESIUM SULFATE 4 GM/100ML IV SOLN
4.0000 g | Freq: Once | INTRAVENOUS | Status: AC
  Administered 2023-01-02: 4 g via INTRAVENOUS
  Filled 2023-01-02: qty 100

## 2023-01-02 MED ORDER — HEPARIN SODIUM (PORCINE) 1000 UNIT/ML IJ SOLN
INTRAMUSCULAR | Status: AC
  Filled 2023-01-02: qty 1

## 2023-01-02 MED ORDER — ROCURONIUM BROMIDE 10 MG/ML (PF) SYRINGE
PREFILLED_SYRINGE | INTRAVENOUS | Status: DC | PRN
  Administered 2023-01-02 (×4): 50 mg via INTRAVENOUS
  Administered 2023-01-02: 30 mg via INTRAVENOUS
  Administered 2023-01-02: 100 mg via INTRAVENOUS

## 2023-01-02 MED ORDER — LIDOCAINE 2% (20 MG/ML) 5 ML SYRINGE
INTRAMUSCULAR | Status: AC
  Filled 2023-01-02: qty 5

## 2023-01-02 MED ORDER — ACETAMINOPHEN 160 MG/5ML PO SOLN: 1000.0000 mg | Freq: Four times a day (QID) | ORAL | Status: DC

## 2023-01-02 MED ORDER — CHLORHEXIDINE GLUCONATE 4 % EX SOLN: 30.0000 mL | CUTANEOUS | Status: DC

## 2023-01-02 MED ORDER — ONDANSETRON HCL 4 MG/2ML IJ SOLN
INTRAMUSCULAR | Status: AC
  Filled 2023-01-02: qty 2

## 2023-01-02 MED ORDER — VANCOMYCIN HCL IN DEXTROSE 1-5 GM/200ML-% IV SOLN
1000.0000 mg | Freq: Once | INTRAVENOUS | Status: AC
  Administered 2023-01-02: 1000 mg via INTRAVENOUS
  Filled 2023-01-02: qty 200

## 2023-01-02 MED ORDER — PHENYLEPHRINE HCL-NACL 20-0.9 MG/250ML-% IV SOLN
0.0000 ug/min | INTRAVENOUS | Status: DC
  Administered 2023-01-03: 20 ug/min via INTRAVENOUS
  Filled 2023-01-02: qty 250

## 2023-01-02 MED ORDER — PHENYLEPHRINE 80 MCG/ML (10ML) SYRINGE FOR IV PUSH (FOR BLOOD PRESSURE SUPPORT)
PREFILLED_SYRINGE | INTRAVENOUS | Status: DC | PRN
  Administered 2023-01-02: 80 ug via INTRAVENOUS
  Administered 2023-01-02: 40 ug via INTRAVENOUS
  Administered 2023-01-02: 160 ug via INTRAVENOUS
  Administered 2023-01-02: 40 ug via INTRAVENOUS
  Administered 2023-01-02: 80 ug via INTRAVENOUS
  Administered 2023-01-02: 40 ug via INTRAVENOUS
  Administered 2023-01-02: 160 ug via INTRAVENOUS
  Administered 2023-01-02: 80 ug via INTRAVENOUS

## 2023-01-02 MED ORDER — PROPOFOL 10 MG/ML IV BOLUS
INTRAVENOUS | Status: DC | PRN
  Administered 2023-01-02: 20 mg via INTRAVENOUS
  Administered 2023-01-02: 140 mg via INTRAVENOUS
  Administered 2023-01-02 (×2): 20 mg via INTRAVENOUS

## 2023-01-02 MED ORDER — DEXMEDETOMIDINE HCL IN NACL 400 MCG/100ML IV SOLN: 0.0000 ug/kg/h | INTRAVENOUS | Status: DC

## 2023-01-02 MED ORDER — METOPROLOL TARTRATE 5 MG/5ML IV SOLN: 2.5000 mg | INTRAVENOUS | Status: DC | PRN

## 2023-01-02 MED ORDER — INSULIN REGULAR(HUMAN) IN NACL 100-0.9 UT/100ML-% IV SOLN: INTRAVENOUS | Status: DC

## 2023-01-02 MED ORDER — FAMOTIDINE IN NACL 20-0.9 MG/50ML-% IV SOLN
20.0000 mg | Freq: Two times a day (BID) | INTRAVENOUS | Status: AC
  Administered 2023-01-02 (×2): 20 mg via INTRAVENOUS
  Filled 2023-01-02 (×2): qty 50

## 2023-01-02 MED ORDER — ONDANSETRON HCL 4 MG/2ML IJ SOLN
INTRAMUSCULAR | Status: DC | PRN
  Administered 2023-01-02: 4 mg via INTRAVENOUS

## 2023-01-02 MED ORDER — SODIUM BICARBONATE 8.4 % IV SOLN
50.0000 meq | Freq: Once | INTRAVENOUS | Status: AC
  Administered 2023-01-02: 50 meq via INTRAVENOUS

## 2023-01-02 MED ORDER — ACETAMINOPHEN 160 MG/5ML PO SOLN: 650.0000 mg | Freq: Once | ORAL | Status: AC

## 2023-01-02 MED ORDER — NITROGLYCERIN IN D5W 200-5 MCG/ML-% IV SOLN: 0.0000 ug/min | INTRAVENOUS | Status: DC

## 2023-01-02 MED ORDER — OXYCODONE HCL 5 MG PO TABS
5.0000 mg | ORAL_TABLET | ORAL | Status: DC | PRN
  Administered 2023-01-02 – 2023-01-04 (×7): 10 mg via ORAL
  Administered 2023-01-05: 5 mg via ORAL
  Administered 2023-01-06 (×4): 10 mg via ORAL
  Filled 2023-01-02 (×4): qty 2
  Filled 2023-01-02: qty 1
  Filled 2023-01-02 (×8): qty 2

## 2023-01-02 MED ORDER — HEPARIN SODIUM (PORCINE) 1000 UNIT/ML IJ SOLN
INTRAMUSCULAR | Status: AC
  Filled 2023-01-02: qty 20

## 2023-01-02 MED ORDER — DEXTROSE 50 % IV SOLN: 0.0000 mL | INTRAVENOUS | Status: DC | PRN

## 2023-01-02 MED ORDER — SODIUM CHLORIDE 0.9 % IV SOLN: INTRAVENOUS | Status: DC

## 2023-01-02 MED ORDER — LACTATED RINGERS IV SOLN: INTRAVENOUS | Status: DC

## 2023-01-02 MED ORDER — BISACODYL 10 MG RE SUPP: 10.0000 mg | Freq: Every day | RECTAL | Status: DC

## 2023-01-02 MED ORDER — 0.9 % SODIUM CHLORIDE (POUR BTL) OPTIME
TOPICAL | Status: DC | PRN
  Administered 2023-01-02: 5000 mL

## 2023-01-02 MED ORDER — SODIUM CHLORIDE 0.9% FLUSH: 3.0000 mL | INTRAVENOUS | Status: DC | PRN

## 2023-01-02 MED ORDER — CHLORHEXIDINE GLUCONATE 0.12 % MT SOLN
15.0000 mL | OROMUCOSAL | Status: AC
  Administered 2023-01-02: 15 mL via OROMUCOSAL
  Filled 2023-01-02: qty 15

## 2023-01-02 MED ORDER — ASPIRIN 81 MG PO CHEW: 324.0000 mg | CHEWABLE_TABLET | Freq: Every day | ORAL | Status: DC

## 2023-01-02 MED ORDER — ALBUMIN HUMAN 5 % IV SOLN: INTRAVENOUS | Status: DC | PRN

## 2023-01-02 MED ORDER — PROTAMINE SULFATE 10 MG/ML IV SOLN
INTRAVENOUS | Status: DC | PRN
  Administered 2023-01-02: 25 mg via INTRAVENOUS
  Administered 2023-01-02: 75 mg via INTRAVENOUS
  Administered 2023-01-02: 50 mg via INTRAVENOUS
  Administered 2023-01-02: 150 mg via INTRAVENOUS

## 2023-01-02 MED ORDER — THROMBIN (RECOMBINANT) 20000 UNITS EX SOLR
CUTANEOUS | Status: AC
  Filled 2023-01-02: qty 20000

## 2023-01-02 MED ORDER — ALBUMIN HUMAN 5 % IV SOLN
250.0000 mL | INTRAVENOUS | Status: DC | PRN
  Administered 2023-01-02 (×3): 12.5 g via INTRAVENOUS
  Filled 2023-01-02: qty 250

## 2023-01-02 MED ORDER — DOCUSATE SODIUM 100 MG PO CAPS
200.0000 mg | ORAL_CAPSULE | Freq: Every day | ORAL | Status: DC
  Administered 2023-01-03 – 2023-01-04 (×2): 200 mg via ORAL
  Filled 2023-01-02 (×2): qty 2

## 2023-01-02 MED ORDER — HEMOSTATIC AGENTS (NO CHARGE) OPTIME
TOPICAL | Status: DC | PRN
  Administered 2023-01-02 (×5): 1 via TOPICAL

## 2023-01-02 MED ORDER — ACETAMINOPHEN 650 MG RE SUPP
650.0000 mg | Freq: Once | RECTAL | Status: AC
  Administered 2023-01-02: 650 mg via RECTAL
  Filled 2023-01-02: qty 1

## 2023-01-02 MED ORDER — THROMBIN 20000 UNITS EX SOLR
CUTANEOUS | Status: DC | PRN
  Administered 2023-01-02: 20000 [IU] via TOPICAL

## 2023-01-02 MED ORDER — LAMOTRIGINE 100 MG PO TABS
100.0000 mg | ORAL_TABLET | Freq: Every day | ORAL | Status: DC
  Administered 2023-01-03 – 2023-01-08 (×6): 100 mg
  Filled 2023-01-02 (×6): qty 1

## 2023-01-02 MED ORDER — MIDAZOLAM HCL 2 MG/2ML IJ SOLN
2.0000 mg | INTRAMUSCULAR | Status: DC | PRN
  Administered 2023-01-02: 2 mg via INTRAVENOUS
  Filled 2023-01-02: qty 2

## 2023-01-02 MED ORDER — ROSUVASTATIN CALCIUM 5 MG PO TABS
10.0000 mg | ORAL_TABLET | Freq: Every day | ORAL | Status: DC
  Administered 2023-01-03 – 2023-02-04 (×32): 10 mg via ORAL
  Filled 2023-01-02 (×32): qty 2

## 2023-01-02 MED ORDER — PANTOPRAZOLE SODIUM 40 MG PO TBEC
40.0000 mg | DELAYED_RELEASE_TABLET | Freq: Every day | ORAL | Status: DC
  Administered 2023-01-04 – 2023-01-06 (×3): 40 mg via ORAL
  Filled 2023-01-02 (×3): qty 1

## 2023-01-02 MED ORDER — PLASMA-LYTE A IV SOLN
INTRAVENOUS | Status: DC | PRN
  Administered 2023-01-02: 500 mL

## 2023-01-02 MED ORDER — PROTAMINE SULFATE 10 MG/ML IV SOLN
INTRAVENOUS | Status: AC
  Filled 2023-01-02: qty 50

## 2023-01-02 MED ORDER — LACTATED RINGERS IV SOLN
INTRAVENOUS | Status: DC
  Administered 2023-01-02: 10 mL/h via INTRAVENOUS

## 2023-01-02 MED ORDER — POTASSIUM CHLORIDE 10 MEQ/50ML IV SOLN: 10.0000 meq | INTRAVENOUS | Status: AC

## 2023-01-02 MED ORDER — ~~LOC~~ CARDIAC SURGERY, PATIENT & FAMILY EDUCATION
Freq: Once | Status: DC
  Filled 2023-01-02: qty 1

## 2023-01-02 MED ORDER — MIDAZOLAM HCL (PF) 5 MG/ML IJ SOLN
INTRAMUSCULAR | Status: DC | PRN
  Administered 2023-01-02: 1 mg via INTRAVENOUS
  Administered 2023-01-02 (×2): 2 mg via INTRAVENOUS
  Administered 2023-01-02: 1 mg via INTRAVENOUS
  Administered 2023-01-02 (×4): 2 mg via INTRAVENOUS

## 2023-01-02 MED ORDER — MIDAZOLAM HCL (PF) 10 MG/2ML IJ SOLN
INTRAMUSCULAR | Status: AC
  Filled 2023-01-02: qty 2

## 2023-01-02 MED ORDER — TRAMADOL HCL 50 MG PO TABS
50.0000 mg | ORAL_TABLET | ORAL | Status: DC | PRN
  Administered 2023-01-02 – 2023-01-07 (×12): 100 mg via ORAL
  Filled 2023-01-02 (×12): qty 2

## 2023-01-02 MED ORDER — MORPHINE SULFATE (PF) 2 MG/ML IV SOLN
1.0000 mg | INTRAVENOUS | Status: DC | PRN
  Administered 2023-01-02 (×2): 4 mg via INTRAVENOUS
  Administered 2023-01-03: 2 mg via INTRAVENOUS
  Administered 2023-01-03: 4 mg via INTRAVENOUS
  Administered 2023-01-03: 2 mg via INTRAVENOUS
  Filled 2023-01-02: qty 1
  Filled 2023-01-02 (×2): qty 2
  Filled 2023-01-02: qty 1
  Filled 2023-01-02: qty 2

## 2023-01-02 MED ORDER — SODIUM CHLORIDE 0.9 % IV SOLN: 250.0000 mL | INTRAVENOUS | Status: DC

## 2023-01-02 SURGICAL SUPPLY — 123 items
ADH SKN CLS APL DERMABOND .7 (GAUZE/BANDAGES/DRESSINGS) ×4
BAG DECANTER FOR FLEXI CONT (MISCELLANEOUS) ×2 IMPLANT
BLADE CLIPPER SURG (BLADE) ×2 IMPLANT
BLADE STERNUM SYSTEM 6 (BLADE) ×2 IMPLANT
BNDG CMPR MED 10X6 ELC LF (GAUZE/BANDAGES/DRESSINGS) ×4
BNDG CMPR STD VLCR NS LF 5.8X4 (GAUZE/BANDAGES/DRESSINGS) ×4
BNDG ELASTIC 4X5.8 VLCR NS LF (GAUZE/BANDAGES/DRESSINGS) IMPLANT
BNDG ELASTIC 4X5.8 VLCR STR LF (GAUZE/BANDAGES/DRESSINGS) ×2 IMPLANT
BNDG ELASTIC 6X10 VLCR STRL LF (GAUZE/BANDAGES/DRESSINGS) IMPLANT
BNDG ELASTIC 6X5.8 VLCR STR LF (GAUZE/BANDAGES/DRESSINGS) ×2 IMPLANT
BNDG GAUZE DERMACEA FLUFF 4 (GAUZE/BANDAGES/DRESSINGS) ×2 IMPLANT
BNDG GZE DERMACEA 4 6PLY (GAUZE/BANDAGES/DRESSINGS) ×4
CANISTER SUCT 3000ML PPV (MISCELLANEOUS) ×2 IMPLANT
CANNULA AORTIC ROOT 9FR (CANNULA) IMPLANT
CANNULA ARTERIAL NVNT 3/8 22FR (MISCELLANEOUS) IMPLANT
CANNULA MC2 2 STG 36/46 CONN (CANNULA) IMPLANT
CANNULA VESSEL 3MM BLUNT TIP (CANNULA) IMPLANT
CATH ROBINSON RED A/P 18FR (CATHETERS) ×4 IMPLANT
CATH THORACIC 28FR (CATHETERS) ×2 IMPLANT
CATH THORACIC 36FR (CATHETERS) ×2 IMPLANT
CATH THORACIC 36FR RT ANG (CATHETERS) ×2 IMPLANT
CLIP TI MEDIUM 24 (CLIP) IMPLANT
CLIP TI WIDE RED SMALL 24 (CLIP) IMPLANT
CONTAINER PROTECT SURGISLUSH (MISCELLANEOUS) ×4 IMPLANT
DERMABOND ADVANCED .7 DNX12 (GAUZE/BANDAGES/DRESSINGS) IMPLANT
DRAPE CARDIOVASCULAR INCISE (DRAPES) ×2
DRAPE INCISE IOBAN 66X45 STRL (DRAPES) IMPLANT
DRAPE SLUSH MACHINE 52X66 (DRAPES) IMPLANT
DRAPE SRG 135X102X78XABS (DRAPES) ×2 IMPLANT
DRSG COVADERM 4X14 (GAUZE/BANDAGES/DRESSINGS) ×2 IMPLANT
ELECT BLADE 4.0 EZ CLEAN MEGAD (MISCELLANEOUS) ×2
ELECT CAUTERY BLADE 6.4 (BLADE) ×2 IMPLANT
ELECT REM PT RETURN 9FT ADLT (ELECTROSURGICAL) ×4
ELECTRODE BLDE 4.0 EZ CLN MEGD (MISCELLANEOUS) IMPLANT
ELECTRODE REM PT RTRN 9FT ADLT (ELECTROSURGICAL) ×4 IMPLANT
FELT TEFLON 1X6 (MISCELLANEOUS) ×2 IMPLANT
GAUZE 4X4 16PLY ~~LOC~~+RFID DBL (SPONGE) IMPLANT
GAUZE SPONGE 4X4 12PLY STRL (GAUZE/BANDAGES/DRESSINGS) ×4 IMPLANT
GLOVE BIO SURGEON STRL SZ 6 (GLOVE) IMPLANT
GLOVE BIO SURGEON STRL SZ 6.5 (GLOVE) IMPLANT
GLOVE BIO SURGEON STRL SZ7 (GLOVE) IMPLANT
GLOVE BIO SURGEON STRL SZ7.5 (GLOVE) IMPLANT
GLOVE BIOGEL PI IND STRL 6 (GLOVE) IMPLANT
GLOVE BIOGEL PI IND STRL 6.5 (GLOVE) IMPLANT
GLOVE BIOGEL PI IND STRL 7.0 (GLOVE) IMPLANT
GLOVE BIOGEL PI IND STRL 7.5 (GLOVE) IMPLANT
GLOVE ECLIPSE 6.5 STRL STRAW (GLOVE) IMPLANT
GLOVE ECLIPSE 7.0 STRL STRAW (GLOVE) ×4 IMPLANT
GLOVE ORTHO TXT STRL SZ7.5 (GLOVE) IMPLANT
GLOVE SS BIOGEL STRL SZ 6 (GLOVE) IMPLANT
GLOVE SS BIOGEL STRL SZ 7.5 (GLOVE) IMPLANT
GOWN STRL REUS W/ TWL LRG LVL3 (GOWN DISPOSABLE) ×8 IMPLANT
GOWN STRL REUS W/ TWL XL LVL3 (GOWN DISPOSABLE) ×2 IMPLANT
GOWN STRL REUS W/TWL LRG LVL3 (GOWN DISPOSABLE) ×24
GOWN STRL REUS W/TWL XL LVL3 (GOWN DISPOSABLE) ×8
HEMOSTAT POWDER SURGIFOAM 1G (HEMOSTASIS) ×6 IMPLANT
HEMOSTAT SURGICEL 2X14 (HEMOSTASIS) ×2 IMPLANT
INSERT FOGARTY 61MM (MISCELLANEOUS) IMPLANT
INSERT FOGARTY XLG (MISCELLANEOUS) IMPLANT
KIT BASIN OR (CUSTOM PROCEDURE TRAY) ×2 IMPLANT
KIT SUCTION CATH 14FR (SUCTIONS) ×2 IMPLANT
KIT TURNOVER KIT B (KITS) ×2 IMPLANT
KIT VASOVIEW HEMOPRO 2 VH 4000 (KITS) ×2 IMPLANT
KNIFE MICRO-UNI 3.5 30 DEG (BLADE) ×2 IMPLANT
NS IRRIG 1000ML POUR BTL (IV SOLUTION) ×10 IMPLANT
PACK E OPEN HEART (SUTURE) ×2 IMPLANT
PACK OPEN HEART (CUSTOM PROCEDURE TRAY) ×2 IMPLANT
PAD ARMBOARD 7.5X6 YLW CONV (MISCELLANEOUS) ×4 IMPLANT
PAD ELECT DEFIB RADIOL ZOLL (MISCELLANEOUS) ×2 IMPLANT
PENCIL BUTTON HOLSTER BLD 10FT (ELECTRODE) ×2 IMPLANT
POSITIONER HEAD DONUT 9IN (MISCELLANEOUS) ×2 IMPLANT
PUNCH AORTIC ROTATE 4.0MM (MISCELLANEOUS) IMPLANT
PUNCH AORTIC ROTATE 4.5MM 8IN (MISCELLANEOUS) ×2 IMPLANT
PUNCH AORTIC ROTATE 5MM 8IN (MISCELLANEOUS) IMPLANT
SEALANT SURG COSEAL 8ML (VASCULAR PRODUCTS) IMPLANT
SET MPS 3-ND DEL (MISCELLANEOUS) IMPLANT
SOL PREP POV-IOD 4OZ 10% (MISCELLANEOUS) IMPLANT
SOL SCRUB PVP POV-IOD 4OZ 7.5% (MISCELLANEOUS) ×4
SOLUTION SCRB POV-IOD 4OZ 7.5% (MISCELLANEOUS) IMPLANT
SPONGE INTESTINAL PEANUT (DISPOSABLE) IMPLANT
SPONGE T-LAP 18X18 ~~LOC~~+RFID (SPONGE) IMPLANT
SPONGE T-LAP 4X18 ~~LOC~~+RFID (SPONGE) IMPLANT
SUPPORT HEART JANKE-BARRON (MISCELLANEOUS) ×2 IMPLANT
SUT BONE WAX W31G (SUTURE) ×2 IMPLANT
SUT MNCRL AB 4-0 PS2 18 (SUTURE) IMPLANT
SUT PROLENE 3 0 SH DA (SUTURE) IMPLANT
SUT PROLENE 3 0 SH1 36 (SUTURE) ×2 IMPLANT
SUT PROLENE 4 0 RB 1 (SUTURE)
SUT PROLENE 4 0 SH DA (SUTURE) IMPLANT
SUT PROLENE 4-0 RB1 .5 CRCL 36 (SUTURE) IMPLANT
SUT PROLENE 5 0 C 1 36 (SUTURE) IMPLANT
SUT PROLENE 6 0 C 1 30 (SUTURE) IMPLANT
SUT PROLENE 7 0 BV 1 (SUTURE) IMPLANT
SUT PROLENE 7 0 BV1 MDA (SUTURE) ×2 IMPLANT
SUT PROLENE 8 0 BV175 6 (SUTURE) IMPLANT
SUT SILK  1 MH (SUTURE)
SUT SILK 1 MH (SUTURE) IMPLANT
SUT SILK 2 0 SH (SUTURE) IMPLANT
SUT SILK 2 0 SH CR/8 (SUTURE) IMPLANT
SUT STEEL STERNAL CCS#1 18IN (SUTURE) IMPLANT
SUT STEEL SZ 6 DBL 3X14 BALL (SUTURE) IMPLANT
SUT TEM PAC WIRE 2 0 SH (SUTURE) IMPLANT
SUT VIC AB 1 CTX 36 (SUTURE) ×6
SUT VIC AB 1 CTX36XBRD ANBCTR (SUTURE) ×4 IMPLANT
SUT VIC AB 2-0 CT1 27 (SUTURE) ×4
SUT VIC AB 2-0 CT1 TAPERPNT 27 (SUTURE) IMPLANT
SUT VIC AB 2-0 CTX 27 (SUTURE) IMPLANT
SUT VIC AB 3-0 SH 27 (SUTURE)
SUT VIC AB 3-0 SH 27X BRD (SUTURE) IMPLANT
SUT VIC AB 3-0 X1 27 (SUTURE) IMPLANT
SUT VICRYL 4-0 PS2 18IN ABS (SUTURE) IMPLANT
SYSTEM SAHARA CHEST DRAIN ATS (WOUND CARE) ×2 IMPLANT
TAPE CLOTH SURG 4X10 WHT LF (GAUZE/BANDAGES/DRESSINGS) IMPLANT
TAPE PAPER 2X10 WHT MICROPORE (GAUZE/BANDAGES/DRESSINGS) IMPLANT
TOWEL GREEN STERILE (TOWEL DISPOSABLE) ×2 IMPLANT
TOWEL GREEN STERILE FF (TOWEL DISPOSABLE) ×2 IMPLANT
TRAY FOLEY SLVR 16FR LF STAT (SET/KITS/TRAYS/PACK) IMPLANT
TRAY FOLEY SLVR 16FR TEMP STAT (SET/KITS/TRAYS/PACK) ×2 IMPLANT
TUBE SUCT INTRACARD DLP 20F (MISCELLANEOUS) IMPLANT
TUBE SUCTION CARDIAC 10FR (CANNULA) IMPLANT
TUBING LAP HI FLOW INSUFFLATIO (TUBING) ×2 IMPLANT
UNDERPAD 30X36 HEAVY ABSORB (UNDERPADS AND DIAPERS) ×2 IMPLANT
WATER STERILE IRR 1000ML POUR (IV SOLUTION) ×4 IMPLANT

## 2023-01-02 NOTE — Hospital Course (Addendum)
PCP is Rudd, Bertram Millard, MD Referring Provider is Orbie Pyo, MD   HPI:   The patient is a 67 year old gentleman with a history of hypertension, hyperlipidemia, prediabetes, obesity, sleep apnea, and mitral valve prolapse.  He has at least a 1 year history of of dyspnea on exertion which he thought may be due to his weight.  He has not had any chest discomfort.  He has noted onset of tiredness and exertional fatigue over the past several months.  He has had mild bilateral lower extremity swelling.  He continues to work part-time in a [employer] and is worn out at the end of his shift.  His most recent echo on 11/21/2022 showed a left ventricular ejection fraction of 55 to 60% with mild concentric LVH and grade 1 diastolic dysfunction.  There is no significant valvular abnormality.  Cardiac catheterization on the same day showed significant three-vessel coronary disease with serial 80% stenoses in the proximal and distal RCA which is a large dominant vessel.  The left circumflex had 80% proximal stenosis and 60% stenosis in the first marginal branch.  The LAD had 60% mid vessel stenosis at the takeoff of the second diagonal branch which also had about 60% proximal stenosis.  The RFR in the LAD was 0.81.  LVEDP was 14 mmHg.   He is a non-smoker but does use smokeless tobacco products.  Following full review of the patient and relevant studies it was Dr. Sharee Pimple opinion that CABG would be his best revascularization option. He reviewed the treatment options and risks and benefits with the patient. Bryan Brock was agreeable to proceed with surgery.  Hospital course:   The patient was admitted to [facility] and taken to the operating room on 01/02/2023 at which time he underwent CABG x 4 utilizing LIMA to LAD, SVG to Diagonal 2, SVG to OM and SVG to PDA as well as endoscopic harvest of bilateral greater saphenous veins. He tolerated the procedure well and was taken to the surgical intensive care  unit in stable condition.  Postoperative hospital course:  The patient was extubated without difficulty using standard cardiac surgical protocols.  He he remained hemodynamically stable but requiring some neosynephrine initially which was weaned as hemodynamics tolerated.  He had some postoperative expected volume overload but postop day 1 creatinine was 1.33 so diuretics were initially held. Swan ganz catheter, arterial line and chest tubes were removed without complication. He had mild expected postoperative acute blood loss anemia which was monitored clinically. He was felt surgically stable for transfer from the ICU to 4E for further convalescence on 05/11. He went into atrial fibrillation on 05/12 and was started on an Amiodarone drip. Lopressor was increased to 25 mg bid. He converted to NSR, PO Amiodarone was started and Amiodarone drip was discontinued. Creatinine improved, he was volume overload and diuresed with Lasix. He has a history of pre diabetes and his pre op HGA1C was 6.6. PCP follow up as an outpatient was arranged and nutrition information has been provided with his discharge paperwork. He began having an increased amount of sternal pain and there was some sternal movement. Chest CT on 05/13 showed incomplete approximation at the inferior portion of the sternum due to the lower two wires pulling through as well as a nondisplaced fracture of the left 1st rib. Dr. Laneta Simmers reviewed the results with the patient and determined he would need to be taken back to the operating room for rewiring to avoid further disruption or  infection. He had mild hyponatremia likely due to diuresis which remained stable. He converted to rate controlled atrial fibrillation, he was given a dose of IV Amiodarone and converted to NSR. He had not had a bowel movement on POD5 and was experiencing some nausea. He was started on lactulose and scheduled Reglan. He began moving his bowels without difficulty.  He was brought  to the operating room on 01/07/23 and underwent sternal rewiring without complication. He tolerated the procedure well and was transferred to the SICU in stable condition. He remained intubated overnight to allow for sternal healing, the critical care team was consulted for extubation management and he was extubated on 05/15. He converted to atrial fibrillation with RVR, he was transitioned to IV Amiodarone.  He continued to have some intermittent brief runs of A-fib. The amiodarone was transitioned to oral and he converted to sinus rhythm. Chest tubes remained in place due to moderate drainage. He had significant edema and was aggressively diuresed. Chest tubes were removed without complication on 01/14/23. CXR showed a small right pneumothorax and left lower lobe pleural effusion/atelectasis. He was saturating well on 1L [place], this was weaned as tolerated. There was some serosanguinous drainage around the inferior portion of his sternal incision and he had some instability at the left chest wall due to dislocation of costal cartilage from his ribs. CT was obtained on 01/15/23 and showed displacement of the left 3rd, 4th and 5th ribs with similar air and fluid within the sternotomy wound and significantly increased fluid within the anterior mediastinum and new air and fluid within the left pectoralis minor musculature as well as a moderate right pneumothorax, trace right pleural effusion and small left pleural effusion with associated atelectasis. Thoracentesis was discussed with Dr. Maren Beach and it was felt pleural effusion was too small to drain at this time. Diuresis was continued and pulmonary hygiene was encouraged. Dressing changes were continued twice per day and he was started on empiric Keflex. The incision remained clear of signs of cellulitis or infection. Nutrition was consulted to optimize his diet and he was started on low dose zinc for wound healing. Leukocytosis resolved. Right pneumothorax improved  with time. He was saturating well on 1L [place]. He began to have complaints of "shortness of breath,  chest x-ray obtained on 01/18/2023 showed persistent left-sided airspace disease.  Right-sided pneumothorax could not be visualized on the portable chest x-ray.  Despite feeling short of breath, he continued to maintain excellent oxygen saturations in the high 90s on 1 L of oxygen by nasal cannula. He had mild serous drainage from the lower end of the incision with a saturated 4 x 4 in the interval between changes. CT chest on 01/19/2023 showed increased sternal dehiscence at the inferior portion and increased fluid buildup to the left of the sternum. Pulmonary/critical care medicine was consulted and he underwent a diagnostic left thoracentesis on 05/26 which drained 50cc of amber fluid which showed no organism growth and no WBC on gram stain. Diuresis, nebulizers, IS and flutter valve were continued. There was concern for possible infection with the enlarging fluid collection. Dr. Donata Clay, Dr. Leafy Ro and Critical care felt he would benefit from operative intervention.  Dr. Maren Beach reviewed the treatment options as well as the risks and benefits of surgery with the patient and Bryan Brock was agreeable to proceed. He was started on IV Cefepime and IV Vancomycin. He was taken to the operating room on 05/27 and underwent sternal wound drainage and culture as well  as wound vac placement. He tolerated the procedure well, was extubated without complication and was transferred to the SICU in stable condition. Wound gram stain and cultures were negative for growth. He was tolerating the wound vac well and continued to saturate well on 1L [place]. He was felt stable for transfer to 4E. He became hypokalemic and AKI worsened likely due to diuresis, Lasix was held and he was aggressively supplemented with potassium. TED hose were placed for edema.   Plastic surgery was consulted and he was brought back to the operating room on  01/23/23 where he underwent wound excision with placement of Myriad powder and application of wound vac. He tolerated the procedure well was extubated and transferred back to 4E in stable condition. There was no sign of infection in the wound and there was no growth on wound cultures, IV antibiotics were discontinued and he was transitioned to empiric PO Keflex for 7 days. Hypokalemia worsened as he did not receive full potassium dose due to surgery, aggressive potassium supplementation continued. His potassium level improved. Wound VAC became occluded and removed and a new dressing was placed. He was not going to have full time help at home at time of discharge. The patient is unable to get out of bed without assistance. CIR consult was placed and he was a candidate at the time but was not medically ready for CIR, they would continue to follow him. Plastic surgery changed the wound VAC on 01/27/23 and planned to take him back to the OR on 01/29/23. The wound VAC became blocked and was not suctioning appropriately, plastic surgery changed this at the bedside.   Plastic surgery brought him to the operating room on 06/05 where he underwent sternal debridement, Myriad placement and placement of wound VAC. He tolerated the procedure well and was transferred back to 4E in stable condition. Due to the size of the wound plastic surgery determined he would benefit from right pectoralis muscle flap closure.   Plastic surgery brought him to the OR on 06/07 and he underwent right pectoralis muscle flap closure. He tolerated the procedure well and was transferred to 4E in stable condition. His JPs had a fair amount of output post op day one. H and H was monitored and JP output gradually decreased. The patient was approved by CIR but the plastic surgery team wanted to continue following him in the hospital to monitor JP output.

## 2023-01-02 NOTE — Transfer of Care (Signed)
Immediate Anesthesia Transfer of Care Note  Patient: SANTEE VERAS  Procedure(s) Performed: CORONARY ARTERY BYPASS GRAFTING (CABG) TIMES FOUR USING THE LEFT INTERNAL MAMMARY ARTERY (LIMA) AND BILATERAL GREATER SAPHENOUS VEIN ARTERIES (Chest) TRANSESOPHAGEAL ECHOCARDIOGRAM  Patient Location: ICU  Anesthesia Type:General  Level of Consciousness: Patient remains intubated per anesthesia plan  Airway & Oxygen Therapy: Patient remains intubated per anesthesia plan and Patient placed on Ventilator (see vital sign flow sheet for setting)  Post-op Assessment: Report given to RN  Post vital signs: Reviewed and stable  Last Vitals:  Vitals Value Taken Time  BP 122/71   Temp    Pulse 80 01/02/23 1413  Resp 15 01/02/23 1413  SpO2 100 % 01/02/23 1413  Vitals shown include unvalidated device data.  Last Pain:  Vitals:   01/02/23 0642  TempSrc:   PainSc: 0-No pain         Complications: No notable events documented.

## 2023-01-02 NOTE — Anesthesia Postprocedure Evaluation (Signed)
Anesthesia Post Note  Patient: Bryan Brock  Procedure(s) Performed: CORONARY ARTERY BYPASS GRAFTING (CABG) TIMES FOUR USING THE LEFT INTERNAL MAMMARY ARTERY (LIMA) AND BILATERAL GREATER SAPHENOUS VEIN ARTERIES (Chest) TRANSESOPHAGEAL ECHOCARDIOGRAM     Patient location during evaluation: PACU Anesthesia Type: General Level of consciousness: awake and alert Pain management: pain level controlled Vital Signs Assessment: post-procedure vital signs reviewed and stable Respiratory status: spontaneous breathing, nonlabored ventilation, respiratory function stable and patient connected to nasal cannula oxygen Cardiovascular status: blood pressure returned to baseline and stable Postop Assessment: no apparent nausea or vomiting Anesthetic complications: no  No notable events documented.  Last Vitals:  Vitals:   01/02/23 1500 01/02/23 1520  BP:    Pulse: 81 71  Resp: (!) 23 (!) 23  Temp: 37.1 C (!) 34.2 C  SpO2: 98% 99%    Last Pain:  Vitals:   01/02/23 0642  TempSrc:   PainSc: 0-No pain                 Shelton Silvas

## 2023-01-02 NOTE — Progress Notes (Signed)
Dr Laneta Simmers at bedside. Order received to d/c swan.

## 2023-01-02 NOTE — Anesthesia Procedure Notes (Signed)
Procedure Name: Intubation Date/Time: 01/02/2023 7:56 AM  Performed by: De Nurse, CRNAPre-anesthesia Checklist: Patient identified, Emergency Drugs available, Suction available and Patient being monitored Patient Re-evaluated:Patient Re-evaluated prior to induction Oxygen Delivery Method: Circle system utilized Preoxygenation: Pre-oxygenation with 100% oxygen Induction Type: IV induction Ventilation: Mask ventilation without difficulty Laryngoscope Size: Mac and 4 Grade View: Grade I Tube type: Oral Tube size: 8.0 mm Number of attempts: 1 Airway Equipment and Method: Stylet and Oral airway Placement Confirmation: ETT inserted through vocal cords under direct vision, positive ETCO2 and breath sounds checked- equal and bilateral Secured at: 23 cm Tube secured with: Tape Dental Injury: Teeth and Oropharynx as per pre-operative assessment  Comments: Inserted by Merry Proud under CRNA and MDA supervision

## 2023-01-02 NOTE — Op Note (Signed)
CARDIOVASCULAR SURGERY OPERATIVE NOTE  01/02/2023  Surgeon:  [name], MD  First Assistant: Gershon Crane, PA-C:   An experienced assistant was required given the complexity of this surgery and the standard of surgical care. The assistant was needed for endoscopic vein harvest, exposure, dissection, suctioning, retraction of delicate tissues and sutures, instrument exchange and for overall help during this procedure.   Preoperative Diagnosis:  Severe multi-vessel coronary artery disease   Postoperative Diagnosis:  Same   Procedure:  Median Sternotomy Extracorporeal circulation 3.   Coronary artery bypass grafting x 4  Left internal mammary artery graft to the LAD SVG to diagonal 2 SVG to OM SVG to PDA 4.   Endoscopic vein harvest from the right and left leg   Anesthesia:  General Endotracheal   Clinical History/Surgical Indication:  This 67 year old gentleman has severe three-vessel coronary disease with coronary CT FFR data consistent with hemodynamically significant stenosis in the RCA, left circumflex, and second diagonal branch and RFR positive LAD disease on catheterization. Echocardiogram shows normal left ventricular systolic function with no significant valvular abnormality. I agree that coronary bypass graft surgery is the best treatment for resolution of his exertional fatigue and shortness of breath and to preserve myocardium. I discussed the operative procedure with the patient and and his significant other including alternatives, benefits and risks; including but not limited to bleeding, blood transfusion, infection, stroke, myocardial infarction, graft failure, heart block requiring a permanent pacemaker, organ dysfunction, and death. Bryan Brock understands and agrees to proceed.   Preparation:  The patient was seen in the preoperative holding area and the correct  patient, correct operation were confirmed with the patient after reviewing the medical record and catheterization. The consent was signed by me. Preoperative antibiotics were given. A pulmonary arterial line and radial arterial line were placed by the anesthesia team. The patient was taken back to the operating room and positioned supine on the operating room table. After being placed under general endotracheal anesthesia by the anesthesia team a foley catheter was placed. The neck, chest, abdomen, and both legs were prepped with betadine soap and solution and draped in the usual sterile manner. A surgical time-out was taken and the correct patient and operative procedure were confirmed with the nursing and anesthesia staff.   Cardiopulmonary Bypass:  A median sternotomy was performed. The pericardium was opened in the midline. Right ventricular function appeared normal. The ascending aorta was of normal size and had no palpable plaque. There were no contraindications to aortic cannulation or cross-clamping. The patient was fully systemically heparinized and the ACT was maintained > 400 sec. The proximal aortic arch was cannulated with a 22 F aortic cannula for arterial inflow. Venous cannulation was performed via the right atrial appendage using a two-staged venous cannula. An antegrade cardioplegia/vent cannula was inserted into the mid-ascending aorta. Aortic occlusion was performed with a single cross-clamp. Systemic cooling to 32 degrees Centigrade and topical cooling of the heart with iced saline were used. Hyperkalemic antegrade cold blood cardioplegia was used to induce diastolic arrest and was then given at about 20 minute intervals throughout the period of arrest to maintain myocardial temperature at or below 10 degrees centigrade. A temperature probe was inserted into the interventricular septum and an insulating pad was placed in the pericardium.   Left internal mammary artery harvest:  The  left side of the sternum was retracted using the Rultract retractor. The left internal mammary artery was harvested as a pedicle graft. All side branches  were clipped. It was a medium-sized vessel of good quality with excellent blood flow. It was ligated distally and divided. It was sprayed with topical papaverine solution to prevent vasospasm.   Endoscopic vein harvest:  The right greater saphenous vein was harvested endoscopically through a 2 cm incision medial to the right knee. It was harvested from the upper thigh to below the knee. It was a medium-sized vein of good quality but we could not get enough length for all of the bypasses and therefore a second segment was harvested from the left thing endoscopically. It was a medium sized vein of good quality. The side branches were all ligated with 4-0 silk ties.    Coronary arteries:  The coronary arteries were examined.  LAD:  diffusely diseased. Diagonal was small but graftable LCX:  OM diffusely diseased  RCA:  the main vessel was severely diseased with calcific plaque. The PDA was graftable but diffusely diseased.    Grafts:  LIMA to the LAD: 1.6 mm. It was sewn end to side using 8-0 prolene continuous suture. SVG to diagonal 2:  1.5 mm. It was sewn end to side using 7-0 prolene continuous suture. SVG to OM1:  1.6 mm. It was sewn end to side using 7-0 prolene continuous suture. SVG to PDA:  1.75 mm. It was sewn end to side using 7-0 prolene continuous suture.  The proximal vein graft anastomoses were performed to the mid-ascending aorta using continuous 6-0 prolene suture. Graft markers were placed around the proximal anastomoses.   Completion:  The patient was rewarmed to 37 degrees Centigrade. The clamp was removed from the LIMA pedicle and there was rapid warming of the septum and return of ventricular fibrillation. The crossclamp was removed with a time of 102 minutes. There was spontaneous return of sinus rhythm. The distal  and proximal anastomoses were checked for hemostasis. The position of the grafts was satisfactory. Two temporary epicardial pacing wires were placed on the right atrium and two on the right ventricle. The patient was weaned from CPB without difficulty on no inotropes. CPB time was 122 minutes. Cardiac output was 5 LPM. TEE showed good LV systolic function. Heparin was fully reversed with protamine and the aortic and venous cannulas removed. Hemostasis was achieved. Mediastinal and left pleural drainage tubes were placed. The sternum was closed with double #6 stainless steel wires. The fascia was closed with continuous # 1 vicryl suture. The subcutaneous tissue was closed with 2-0 vicryl continuous suture. The skin was closed with 3-0 vicryl subcuticular suture. All sponge, needle, and instrument counts were reported correct at the end of the case. Dry sterile dressings were placed over the incisions and around the chest tubes which were connected to pleurevac suction. The patient was then transported to the surgical intensive care unit in stable condition.

## 2023-01-02 NOTE — Anesthesia Procedure Notes (Signed)
Central Venous Catheter Insertion Performed by: Shelton Silvas, MD, anesthesiologist Start/End5/04/2023 7:10 AM, 01/02/2023 7:15 AM Patient location: Pre-op. Preanesthetic checklist: patient identified, IV checked, site marked, risks and benefits discussed, surgical consent, monitors and equipment checked, pre-op evaluation, timeout performed and anesthesia consent Hand hygiene performed  and maximum sterile barriers used  PA cath was placed.Swan type:thermodilution Procedure performed without using ultrasound guided technique. Attempts: 1 Patient tolerated the procedure well with no immediate complications.

## 2023-01-02 NOTE — Interval H&P Note (Signed)
History and Physical Interval Note:  01/02/2023 6:34 AM  Bryan Brock  has presented today for surgery, with the diagnosis of CAD.  The various methods of treatment have been discussed with the patient and family. After consideration of risks, benefits and other options for treatment, the patient has consented to  Procedure(s): CORONARY ARTERY BYPASS GRAFTING (CABG) (N/A) TRANSESOPHAGEAL ECHOCARDIOGRAM (N/A) as a surgical intervention.  The patient's history has been reviewed, patient examined, no change in status, stable for surgery.  I have reviewed the patient's chart and labs.  Questions were answered to the patient's satisfaction.     [name]

## 2023-01-02 NOTE — Progress Notes (Signed)
  Echocardiogram Echocardiogram Transesophageal has been performed.  Jazyah Butsch R 01/02/2023, 8:40 AM 

## 2023-01-02 NOTE — Anesthesia Procedure Notes (Signed)
Arterial Line Insertion Start/End5/04/2023 6:45 AM Performed by: Shelton Silvas, MD, De Nurse, CRNA, CRNA  Patient location: Pre-op. Preanesthetic checklist: patient identified, IV checked, site marked, risks and benefits discussed, surgical consent, monitors and equipment checked, pre-op evaluation and timeout performed Lidocaine 1% used for infiltration and patient sedated Left, radial was placed Catheter size: 20 G Hand hygiene performed , maximum sterile barriers used  and Seldinger technique used Allen's test indicative of satisfactory collateral circulation Attempts: 2 Procedure performed without using ultrasound guided technique. Following insertion, dressing applied and Biopatch. Post procedure assessment: normal and unchanged  Patient tolerated the procedure well with no immediate complications.

## 2023-01-02 NOTE — Brief Op Note (Signed)
01/02/2023  2:06 PM  PATIENT:  Bryan Brock  67 y.o. male  PRE-OPERATIVE DIAGNOSIS:  Coronary artery disease  POST-OPERATIVE DIAGNOSIS:  Coronary artery disease  PROCEDURE:  Procedure(s) with comments: CORONARY ARTERY BYPASS GRAFTING (CABG) TIMES FOUR USING THE LEFT INTERNAL MAMMARY ARTERY (LIMA) AND BILATERAL GREATER SAPHENOUS VEIN ARTERIES (N/A) - Open median sternotomy TRANSESOPHAGEAL ECHOCARDIOGRAM (N/A) Vein harvest time: Vein prep time:  SURGEON:  Surgeon(s) and Role:    * Bartle, Payton Doughty, MD - Primary  PHYSICIAN ASSISTANT: Esten Dollar PA-C  ASSISTANTS: RNFA STAFF  ANESTHESIA:   general  EBL:  915 mL   BLOOD ADMINISTERED:none  DRAINS:  LEFT PLEURAL AND MEDIASTINAL CHEST TUBES    LOCAL MEDICATIONS USED:  NONE  SPECIMEN:  No Specimen  DISPOSITION OF SPECIMEN:  N/A  COUNTS:  YES  TOURNIQUET:  * No tourniquets in log *  DICTATION: .Dragon Dictation  PLAN OF CARE: Admit to inpatient   PATIENT DISPOSITION:  ICU - intubated and hemodynamically stable.   Delay start of Pharmacological VTE agent (>24hrs) due to surgical blood loss or risk of bleeding: yes  COMPLICATIONS: NO KNOWN

## 2023-01-02 NOTE — Procedures (Signed)
Extubation Procedure Note  Patient Details:   Name: Bryan Brock DOB: [birth year] MRN: [id number]   Airway Documentation:    Vent end date: 01/02/23 Vent end time: 1819   Evaluation  O2 sats: stable throughout Complications: No apparent complications Patient did tolerate procedure well. Bilateral Breath Sounds: Clear, Diminished   Yes  Patient extubated per rapid heart wean protocol. Positive cuff leak. No stridor noted. NIF-25, VC . Vitals are stable on 4L [place]. RN at bedside.  Harmon Dun Sritha Chauncey 01/02/2023, 6:19 PM

## 2023-01-02 NOTE — Anesthesia Preprocedure Evaluation (Addendum)
Anesthesia Evaluation  Patient identified by MRN, date of birth, ID band Patient awake    Reviewed: Allergy & Precautions, NPO status , Patient's Chart, lab work & pertinent test results  History of Anesthesia Complications (+) PONV and history of anesthetic complications  Airway Mallampati: III  TM Distance: >3 FB Neck ROM: Full    Dental  (+) Teeth Intact, Dental Advisory Given   Pulmonary asthma , sleep apnea    breath sounds clear to auscultation       Cardiovascular hypertension, + CAD and + Peripheral Vascular Disease  + dysrhythmias + Valvular Problems/Murmurs MVP  Rhythm:Regular Rate:Normal  Echo: 1. Left ventricular ejection fraction, by estimation, is 55 to 60%. The  left ventricle has normal function. The left ventricle has no regional  wall motion abnormalities. There is mild concentric left ventricular  hypertrophy. Left ventricular diastolic  parameters are consistent with Grade I diastolic dysfunction (impaired  relaxation). Elevated left atrial pressure.   2. Right ventricular systolic function is normal. The right ventricular  size is normal.   3. Left atrial size was moderately dilated.   4. The mitral valve is normal in structure. No evidence of mitral valve  regurgitation.   5. The aortic valve is tricuspid. Aortic valve regurgitation is not  visualized. No aortic stenosis is present.     Neuro/Psych  PSYCHIATRIC DISORDERS Anxiety Depression Bipolar Disorder      GI/Hepatic hiatal hernia,GERD  ,,(+) Hepatitis -  Endo/Other  negative endocrine ROS    Renal/GU negative Renal ROS     Musculoskeletal  (+) Arthritis ,    Abdominal   Peds  Hematology negative hematology ROS (+)   Anesthesia Other Findings   Reproductive/Obstetrics                             Anesthesia Physical Anesthesia Plan  ASA: 4  Anesthesia Plan: General   Post-op Pain Management:     Induction: Intravenous  PONV Risk Score and Plan: 3 and Ondansetron, Midazolam and Treatment may vary due to age or medical condition  Airway Management Planned: Oral ETT  Additional Equipment: Arterial line, PA Cath, CVP, TEE and Ultrasound Guidance Line Placement  Intra-op Plan:   Post-operative Plan: Post-operative intubation/ventilation  Informed Consent: I have reviewed the patients History and Physical, chart, labs and discussed the procedure including the risks, benefits and alternatives for the proposed anesthesia with the patient or authorized representative who has indicated his/her understanding and acceptance.     Dental advisory given  Plan Discussed with: CRNA  Anesthesia Plan Comments:        Anesthesia Quick Evaluation

## 2023-01-02 NOTE — Anesthesia Procedure Notes (Signed)
Central Venous Catheter Insertion Performed by: Shelton Silvas, MD, anesthesiologist Start/End5/04/2023 7:00 AM, 01/02/2023 7:10 AM Patient location: Pre-op. Preanesthetic checklist: patient identified, IV checked, site marked, risks and benefits discussed, surgical consent, monitors and equipment checked, pre-op evaluation, timeout performed and anesthesia consent Position: Trendelenburg Lidocaine 1% used for infiltration and patient sedated Hand hygiene performed , maximum sterile barriers used  and Seldinger technique used Catheter size: 8.5 Fr Total catheter length 10. Central line was placed.Sheath introducer Swan type:thermodilution PA Cath depth:50 Procedure performed using ultrasound guided technique. Ultrasound Notes:anatomy identified, needle tip was noted to be adjacent to the nerve/plexus identified, no ultrasound evidence of intravascular and/or intraneural injection and image(s) printed for medical record Attempts: 1 Following insertion, line sutured, dressing applied and Biopatch. Post procedure assessment: blood return through all ports, free fluid flow and no air  Patient tolerated the procedure well with no immediate complications.

## 2023-01-02 NOTE — Progress Notes (Signed)
EVENING ROUNDS NOTE :     [address]       [place] [id number]             [phone]                 Day of Surgery Procedure(s) (LRB): CORONARY ARTERY BYPASS GRAFTING (CABG) TIMES FOUR USING THE LEFT INTERNAL MAMMARY ARTERY (LIMA) AND BILATERAL GREATER SAPHENOUS VEIN ARTERIES (N/A) TRANSESOPHAGEAL ECHOCARDIOGRAM (N/A)   Total Length of Stay:  LOS: 0 days  Events:   Minimal CT output Good hemodynamics    BP (!) 166/90   Pulse 71   Temp (!) 93.6 F (34.2 C)   Resp (!) 23   Ht 5\' 11"  (1.803 m)   Wt (!) 142.4 kg   SpO2 99%   BMI 43.79 kg/m   PAP: (16-48)/(-23-17) 26/17  Vent Mode: SIMV;PSV;PRVC FiO2 (%):  [50 %] 50 % Set Rate:  [16 bmp-20 bmp] 20 bmp Vt Set:  [600 mL] 600 mL PEEP:  [5 cmH20] 5 cmH20 Pressure Support:  [10 cmH20] 10 cmH20 Plateau Pressure:  [24 cmH20] 24 cmH20   sodium chloride     [START ON 01/03/2023] sodium chloride     sodium chloride     albumin human      ceFAZolin (ANCEF) IV 2 g (01/02/23 1504)   dexmedetomidine (PRECEDEX) IV infusion 0.7 mcg/kg/hr (01/02/23 1500)   famotidine (PEPCID) IV 100 mL/hr at 01/02/23 1500   insulin 1.3 Units/hr (01/02/23 1546)   lactated ringers     lactated ringers 10 mL/hr at 01/02/23 1500   lactated ringers 20 mL/hr at 01/02/23 1500   magnesium sulfate 4 g (01/02/23 1514)   nitroGLYCERIN     phenylephrine (NEO-SYNEPHRINE) Adult infusion 10 mcg/min (01/02/23 1500)   potassium chloride     vancomycin      No intake/output data recorded.      Latest Ref Rng & Units 01/02/2023    2:27 PM 01/02/2023   12:59 PM 01/02/2023   12:55 PM  CBC  WBC 4.0 - 10.5 K/uL 11.9     Hemoglobin 13.0 - 17.0 g/dL 91.4  78.2  95.6   Hematocrit 39.0 - 52.0 % 37.4  33.0  34.0   Platelets 150 - 400 K/uL 154          Latest Ref Rng & Units 01/02/2023   12:59 PM 01/02/2023   12:55 PM 01/02/2023   12:30 PM  BMP  Glucose 70 - 99 mg/dL 213   086   BUN 8 - 23 mg/dL 23   22   Creatinine 5.78 - 1.24 mg/dL 4.69    6.29   Sodium 528 - 145 mmol/L 137  136  135   Potassium 3.5 - 5.1 mmol/L 5.0  5.0  5.8   Chloride 98 - 111 mmol/L 102   102     ABG    Component Value Date/Time   PHART 7.334 (L) 01/02/2023 1255   PCO2ART 43.3 01/02/2023 1255   PO2ART 67 (L) 01/02/2023 1255   HCO3 23.0 01/02/2023 1255   TCO2 22 01/02/2023 1259   ACIDBASEDEF 3.0 (H) 01/02/2023 1255   O2SAT 92 01/02/2023 1255       Brynda Greathouse, MD 01/02/2023 4:41 PM

## 2023-01-03 ENCOUNTER — Encounter (HOSPITAL_COMMUNITY): Payer: Self-pay | Admitting: Surgery

## 2023-01-03 ENCOUNTER — Inpatient Hospital Stay (HOSPITAL_COMMUNITY): Payer: Medicare Other

## 2023-01-03 LAB — CBC
HCT: 32.7 % — ABNORMAL LOW (ref 39.0–52.0)
HCT: 34.7 % — ABNORMAL LOW (ref 39.0–52.0)
Hemoglobin: 11.1 g/dL — ABNORMAL LOW (ref 13.0–17.0)
Hemoglobin: 11.1 g/dL — ABNORMAL LOW (ref 13.0–17.0)
MCH: 31.8 pg (ref 26.0–34.0)
MCH: 31.2 pg (ref 26.0–34.0)
MCHC: 33.9 g/dL (ref 30.0–36.0)
MCHC: 32 g/dL (ref 30.0–36.0)
MCV: 93.7 fL (ref 80.0–100.0)
MCV: 97.5 fL (ref 80.0–100.0)
Platelets: 136 10*3/uL — ABNORMAL LOW (ref 150–400)
Platelets: 120 10*3/uL — ABNORMAL LOW (ref 150–400)
RBC: 3.49 MIL/uL — ABNORMAL LOW (ref 4.22–5.81)
RBC: 3.56 MIL/uL — ABNORMAL LOW (ref 4.22–5.81)
RDW: 13.2 % (ref 11.5–15.5)
RDW: 13.3 % (ref 11.5–15.5)
WBC: 6.9 10*3/uL (ref 4.0–10.5)
WBC: 8.8 10*3/uL (ref 4.0–10.5)
nRBC: 0 % (ref 0.0–0.2)
nRBC: 0 % (ref 0.0–0.2)

## 2023-01-03 LAB — BASIC METABOLIC PANEL
Anion gap: 7 (ref 5–15)
Anion gap: 10 (ref 5–15)
BUN: 19 mg/dL (ref 8–23)
BUN: 21 mg/dL (ref 8–23)
CO2: 24 mmol/L (ref 22–32)
CO2: 24 mmol/L (ref 22–32)
Calcium: 8.2 mg/dL — ABNORMAL LOW (ref 8.9–10.3)
Calcium: 8.2 mg/dL — ABNORMAL LOW (ref 8.9–10.3)
Chloride: 102 mmol/L (ref 98–111)
Chloride: 96 mmol/L — ABNORMAL LOW (ref 98–111)
Creatinine, Ser: 1.33 mg/dL — ABNORMAL HIGH (ref 0.61–1.24)
Creatinine, Ser: 1.37 mg/dL — ABNORMAL HIGH (ref 0.61–1.24)
GFR, Estimated: 59 mL/min — ABNORMAL LOW (ref >60)
GFR, Estimated: 57 mL/min — ABNORMAL LOW (ref >60)
Glucose, Bld: 131 mg/dL — ABNORMAL HIGH (ref 70–99)
Glucose, Bld: 142 mg/dL — ABNORMAL HIGH (ref 70–99)
Potassium: 4.1 mmol/L (ref 3.5–5.1)
Potassium: 4 mmol/L (ref 3.5–5.1)
Sodium: 133 mmol/L — ABNORMAL LOW (ref 135–145)
Sodium: 130 mmol/L — ABNORMAL LOW (ref 135–145)

## 2023-01-03 LAB — GLUCOSE, CAPILLARY
Glucose-Capillary: 107 mg/dL — ABNORMAL HIGH (ref 70–99)
Glucose-Capillary: 123 mg/dL — ABNORMAL HIGH (ref 70–99)
Glucose-Capillary: 124 mg/dL — ABNORMAL HIGH (ref 70–99)
Glucose-Capillary: 132 mg/dL — ABNORMAL HIGH (ref 70–99)
Glucose-Capillary: 138 mg/dL — ABNORMAL HIGH (ref 70–99)
Glucose-Capillary: 140 mg/dL — ABNORMAL HIGH (ref 70–99)
Glucose-Capillary: 141 mg/dL — ABNORMAL HIGH (ref 70–99)
Glucose-Capillary: 143 mg/dL — ABNORMAL HIGH (ref 70–99)
Glucose-Capillary: 143 mg/dL — ABNORMAL HIGH (ref 70–99)
Glucose-Capillary: 143 mg/dL — ABNORMAL HIGH (ref 70–99)
Glucose-Capillary: 148 mg/dL — ABNORMAL HIGH (ref 70–99)
Glucose-Capillary: 152 mg/dL — ABNORMAL HIGH (ref 70–99)
Glucose-Capillary: 157 mg/dL — ABNORMAL HIGH (ref 70–99)

## 2023-01-03 LAB — MAGNESIUM
Magnesium: 2.1 mg/dL (ref 1.7–2.4)
Magnesium: 2.3 mg/dL (ref 1.7–2.4)

## 2023-01-03 MED ORDER — INSULIN DETEMIR 100 UNIT/ML ~~LOC~~ SOLN
20.0000 [IU] | Freq: Every day | SUBCUTANEOUS | Status: DC
  Administered 2023-01-03 – 2023-01-06 (×4): 20 [IU] via SUBCUTANEOUS
  Filled 2023-01-03 (×5): qty 0.2

## 2023-01-03 MED ORDER — ENOXAPARIN SODIUM 40 MG/0.4ML IJ SOSY
40.0000 mg | PREFILLED_SYRINGE | Freq: Every day | INTRAMUSCULAR | Status: DC
  Administered 2023-01-03 – 2023-01-06 (×4): 40 mg via SUBCUTANEOUS
  Filled 2023-01-03 (×4): qty 0.4

## 2023-01-03 MED ORDER — INSULIN ASPART 100 UNIT/ML IJ SOLN
0.0000 [IU] | INTRAMUSCULAR | Status: DC
  Administered 2023-01-03 – 2023-01-04 (×4): 2 [IU] via SUBCUTANEOUS

## 2023-01-03 MED ORDER — NICOTINE 21 MG/24HR TD PT24
21.0000 mg | MEDICATED_PATCH | Freq: Every day | TRANSDERMAL | Status: DC
  Administered 2023-01-03 – 2023-01-23 (×21): 21 mg via TRANSDERMAL
  Filled 2023-01-03 (×21): qty 1

## 2023-01-03 MED FILL — Heparin Sodium (Porcine) Inj 1000 Unit/ML: Qty: 1000 | Status: AC

## 2023-01-03 MED FILL — Potassium Chloride Inj 2 mEq/ML: INTRAVENOUS | Qty: 40 | Status: AC

## 2023-01-03 MED FILL — Magnesium Sulfate Inj 50%: INTRAMUSCULAR | Qty: 10 | Status: AC

## 2023-01-03 NOTE — Discharge Summary (Addendum)
[address]       [place] [address]             [phone]    Physician Discharge Summary  Patient ID: Bryan Brock MRN: [id number] DOB/AGE: 1956-07-26 67 y.o.  Admit date: 01/02/2023 Discharge date: 01/30/2023  Admission Diagnoses: Patient Active Problem List   Diagnosis Date Noted   OSA (obstructive sleep apnea) 12/19/2021   Essential hypertension 10/29/2021   Elevated transaminase level 10/18/2021   Prediabetes 10/17/2021   Morbid obesity with BMI of 40.0-44.9, adult (HCC) 10/17/2021   Bipolar 1 disorder, depressed (HCC) 05/11/2020   History of basal cell carcinoma 11/05/2007   Hyperlipidemia 11/05/2007   Premature ventricular contractions 11/05/2007   History of mitral valve prolapse 11/05/2007   Discharge Diagnoses:  Patient Active Problem List   Diagnosis Date Noted   Postprocedural pneumothorax 01/19/2023   Loculated pleural effusion 01/19/2023   Acute respiratory failure with hypoxia (HCC) 01/19/2023   Atelectasis 01/13/2023   Sternal wound dehiscence 01/07/2023   S/P CABG x 4 01/02/2023   OSA (obstructive sleep apnea) 12/19/2021   Essential hypertension 10/29/2021   Elevated transaminase level 10/18/2021   Prediabetes 10/17/2021   Morbid obesity with BMI of 40.0-44.9, adult (HCC) 10/17/2021   Bipolar 1 disorder, depressed (HCC) 05/11/2020   History of basal cell carcinoma 11/05/2007   Hyperlipidemia 11/05/2007   Premature ventricular contractions 11/05/2007   History of mitral valve prolapse 11/05/2007   Discharged Condition: {condition:18240}  PCP is Rudd, Bertram Millard, MD Referring Provider is Orbie Pyo, MD   HPI:   The patient is a 67 year old gentleman with a history of hypertension, hyperlipidemia, prediabetes, obesity, sleep apnea, and mitral valve prolapse.  He has at least a 1 year history of of dyspnea on exertion which he thought may be due to his weight.  He has not had any chest discomfort.  He has noted onset of  tiredness and exertional fatigue over the past several months.  He has had mild bilateral lower extremity swelling.  He continues to work part-time in a [employer] and is worn out at the end of his shift.  His most recent echo on 11/21/2022 showed a left ventricular ejection fraction of 55 to 60% with mild concentric LVH and grade 1 diastolic dysfunction.  There is no significant valvular abnormality.  Cardiac catheterization on the same day showed significant three-vessel coronary disease with serial 80% stenoses in the proximal and distal RCA which is a large dominant vessel.  The left circumflex had 80% proximal stenosis and 60% stenosis in the first marginal branch.  The LAD had 60% mid vessel stenosis at the takeoff of the second diagonal branch which also had about 60% proximal stenosis.  The RFR in the LAD was 0.81.  LVEDP was 14 mmHg.   He is a non-smoker but does use smokeless tobacco products.  Following full review of the patient and relevant studies it was Dr. Sharee Pimple opinion that CABG would be his best revascularization option. He reviewed the treatment options and risks and benefits with the patient. Bryan Brock was agreeable to proceed with surgery.  Hospital course:   The patient was admitted to [facility] and taken to the operating room on 01/02/2023 at which time he underwent CABG x 4 utilizing LIMA to LAD, SVG to Diagonal 2, SVG to OM and SVG to PDA as well as endoscopic harvest of bilateral greater saphenous veins. He tolerated the procedure well and  was taken to the surgical intensive care unit in stable condition.  Postoperative hospital course:  The patient was extubated without difficulty using standard cardiac surgical protocols.  He he remained hemodynamically stable but requiring some neosynephrine initially which was weaned as hemodynamics tolerated.  He had some postoperative expected volume overload but postop day 1 creatinine was 1.33 so diuretics were initially  held. Swan ganz catheter, arterial line and chest tubes were removed without complication. He had mild expected postoperative acute blood loss anemia which was monitored clinically. He was felt surgically stable for transfer from the ICU to 4E for further convalescence on 05/11. He went into atrial fibrillation on 05/12 and was started on an Amiodarone drip. Lopressor was increased to 25 mg bid. He converted to NSR, PO Amiodarone was started and Amiodarone drip was discontinued. Creatinine improved, he was volume overload and diuresed with Lasix. He has a history of pre diabetes and his pre op HGA1C was 6.6. PCP follow up as an outpatient was arranged and nutrition information has been provided with his discharge paperwork. He began having an increased amount of sternal pain and there was some sternal movement. Chest CT on 05/13 showed incomplete approximation at the inferior portion of the sternum due to the lower two wires pulling through as well as a nondisplaced fracture of the left 1st rib. Dr. Laneta Simmers reviewed the results with the patient and determined he would need to be taken back to the operating room for rewiring to avoid further disruption or infection. He had mild hyponatremia likely due to diuresis which remained stable. He converted to rate controlled atrial fibrillation, he was given a dose of IV Amiodarone and converted to NSR. He had not had a bowel movement on POD5 and was experiencing some nausea. He was started on lactulose and scheduled Reglan. He began moving his bowels without difficulty.  He was brought to the operating room on 01/07/23 and underwent sternal rewiring without complication. He tolerated the procedure well and was transferred to the SICU in stable condition. He remained intubated overnight to allow for sternal healing, the critical care team was consulted for extubation management and he was extubated on 05/15. He converted to atrial fibrillation with RVR, he was transitioned  to IV Amiodarone.  He continued to have some intermittent brief runs of A-fib. The amiodarone was transitioned to oral and he converted to sinus rhythm. Chest tubes remained in place due to moderate drainage. He had significant edema and was aggressively diuresed. Chest tubes were removed without complication on 01/14/23. CXR showed a small right pneumothorax and left lower lobe pleural effusion/atelectasis. He was saturating well on 1L [place], this was weaned as tolerated. There was some serosanguinous drainage around the inferior portion of his sternal incision and he had some instability at the left chest wall due to dislocation of costal cartilage from his ribs. CT was obtained on 01/15/23 and showed displacement of the left 3rd, 4th and 5th ribs with similar air and fluid within the sternotomy wound and significantly increased fluid within the anterior mediastinum and new air and fluid within the left pectoralis minor musculature as well as a moderate right pneumothorax, trace right pleural effusion and small left pleural effusion with associated atelectasis. Thoracentesis was discussed with Dr. Maren Beach and it was felt pleural effusion was too small to drain at this time. Diuresis was continued and pulmonary hygiene was encouraged. Dressing changes were continued twice per day and he was started on empiric Keflex. The incision remained clear  of signs of cellulitis or infection. Nutrition was consulted to optimize his diet and he was started on low dose zinc for wound healing. Leukocytosis resolved. Right pneumothorax improved with time. He was saturating well on 1L [place]. He began to have complaints of "shortness of breath,  chest x-ray obtained on 01/18/2023 showed persistent left-sided airspace disease.  Right-sided pneumothorax could not be visualized on the portable chest x-ray.  Despite feeling short of breath, he continued to maintain excellent oxygen saturations in the high 90s on 1 L of oxygen by nasal  cannula. He had mild serous drainage from the lower end of the incision with a saturated 4 x 4 in the interval between changes. CT chest on 01/19/2023 showed increased sternal dehiscence at the inferior portion and increased fluid buildup to the left of the sternum. Pulmonary/critical care medicine was consulted and he underwent a diagnostic left thoracentesis on 05/26 which drained 50cc of amber fluid which showed no organism growth and no WBC on gram stain. Diuresis, nebulizers, IS and flutter valve were continued. There was concern for possible infection with the enlarging fluid collection. Dr. Donata Clay, Dr. Leafy Ro and Critical care felt he would benefit from operative intervention.  Dr. Maren Beach reviewed the treatment options as well as the risks and benefits of surgery with the patient and Bryan Brock was agreeable to proceed. He was started on IV Cefepime and IV Vancomycin. He was taken to the operating room on 05/27 and underwent sternal wound drainage and culture as well as wound vac placement. He tolerated the procedure well, was extubated without complication and was transferred to the SICU in stable condition. Wound gram stain and cultures were negative for growth. He was tolerating the wound vac well and continued to saturate well on 1L [place]. He was felt stable for transfer to 4E. He became hypokalemic and AKI worsened likely due to diuresis, Lasix was held and he was aggressively supplemented with potassium. TED hose were placed for edema.   Plastic surgery was consulted and he was brought back to the operating room on 01/23/23 where he underwent wound excision with placement of Myriad powder and application of wound vac. He tolerated the procedure well was extubated and transferred back to 4E in stable condition. There was no sign of infection in the wound and there was no growth on wound cultures, IV antibiotics were discontinued and he was transitioned to empiric PO Keflex for 7 days. Hypokalemia  worsened as he did not receive full potassium dose due to surgery, aggressive potassium supplementation continued. His potassium level improved. Wound VAC became occluded and removed and a new dressing was placed. He was not going to have full time help at home at time of discharge. The patient is unable to get out of bed without assistance. CIR consult was placed and he was a candidate at the time but was not medically ready for CIR, they would continue to follow him. Plastic surgery changed the wound VAC on 01/27/23 and planned to take him back to the OR on 01/29/23. The wound VAC became blocked and was not suctioning appropriately, plastic surgery changed this at the bedside.   Plastic surgery brought him to the operating room on 06/05 where he underwent sternal debridement, Myriad placement and placement of wound VAC. He tolerated the procedure well and was transferred back to 4E in stable condition. Wound VAC was sealed and working well. Plastic surgery determined they would ***. He was brought back to the operating room on ***.  Consults:  Plastic surgery  Significant Diagnostic Studies:  LEFT HEART CATH AND CORONARY ANGIOGRAPHY   Conclusion   Prox Cx lesion is 80% stenosed.   1st Mrg lesion is 60% stenosed.   Mid LAD lesion is 60% stenosed.   2nd Diag lesion is 60% stenosed.   Prox RCA-1 lesion is 80% stenosed.   Prox RCA-2 lesion is 80% stenosed.   Dist RCA lesion is 80% stenosed.   ECHOCARDIOGRAM REPORT   Patient Name:   SLATON COURTER Date of Exam: 11/21/2022  Medical Rec #:  [id number]     Height:       71.0 in  Accession #:    [id number]    Weight:       325.0 lb  Date of Birth:  July 06, 1956     BSA:          2.592 m  Patient Age:    85 years      BP:           130/78 mmHg  Patient Gender: M             HR:           75 bpm.  Exam Location:  Inpatient   Procedure: 2D Echo, Cardiac Doppler and Color Doppler   Indications:    CAD    History:        Patient has prior history of  Echocardiogram examinations,  most                 recent 12/14/2021. Risk Factors:Hypertension, Sleep Apnea,                  Dyslipidemia and Non-Smoker.    Sonographer:    Dondra Prader RVT RCS  Referring Phys: [id number] Corrin Parker   Sonographer Comments: No subcostal window, suboptimal parasternal window,  suboptimal apical window and Technically challenging study due to limited  acoustic windows. Supine.   IMPRESSIONS   1. Left ventricular ejection fraction, by estimation, is 55 to 60%. The  left ventricle has normal function. The left ventricle has no regional  wall motion abnormalities. There is mild concentric left ventricular  hypertrophy. Left ventricular diastolic  parameters are consistent with Grade I diastolic dysfunction (impaired  relaxation). Elevated left atrial pressure.   2. Right ventricular systolic function is normal. The right ventricular  size is normal.   3. Left atrial size was moderately dilated.   4. The mitral valve is normal in structure. No evidence of mitral valve  regurgitation.   5. The aortic valve is tricuspid. Aortic valve regurgitation is not  visualized. No aortic stenosis is present.   FINDINGS   Left Ventricle: Left ventricular ejection fraction, by estimation, is 55  to 60%. The left ventricle has normal function. The left ventricle has no  regional wall motion abnormalities. The left ventricular internal cavity  size was normal in size. There is   mild concentric left ventricular hypertrophy. Left ventricular diastolic  parameters are consistent with Grade I diastolic dysfunction (impaired  relaxation). Elevated left atrial pressure.   Right Ventricle: The right ventricular size is normal. No increase in  right ventricular wall thickness. Right ventricular systolic function is  normal.   Left Atrium: Left atrial size was moderately dilated.   Right Atrium: Right atrial size was normal in size.   Pericardium: There is no  evidence of pericardial effusion.   Mitral Valve: The mitral valve is normal in structure. No evidence of  mitral valve regurgitation.   Tricuspid Valve: The tricuspid valve is normal in structure. Tricuspid  valve regurgitation is not demonstrated.   Aortic Valve: The aortic valve is tricuspid. Aortic valve regurgitation is  not visualized. No aortic stenosis is present. Aortic valve mean gradient  measures 3.0 mmHg. Aortic valve peak gradient measures 4.9 mmHg. Aortic  valve area, by VTI measures 3.00  cm.   Pulmonic Valve: The pulmonic valve was grossly normal. Pulmonic valve  regurgitation is not visualized.   Aorta: The aortic root is normal in size and structure.   IAS/Shunts: No atrial level shunt detected by color flow Doppler.   LEFT VENTRICLE  PLAX 2D  LVIDd:         5.10 cm   Diastology  LVIDs:         3.20 cm   LV e' medial:    9.20 cm/s  LV PW:         1.20 cm   LV E/e' medial:  6.6  LV IVS:        1.30 cm   LV e' lateral:   11.20 cm/s  LVOT diam:     2.20 cm   LV E/e' lateral: 5.4  LV SV:         62  LV SV Index:   24  LVOT Area:     3.80 cm   RIGHT VENTRICLE  RV Basal diam:  3.80 cm  RV S prime:     16.60 cm/s  TAPSE (M-mode): 2.4 cm   LEFT ATRIUM             Index        RIGHT ATRIUM           Index  LA diam:        4.60 cm 1.77 cm/m   RA Area:     13.60 cm  LA Vol (A2C):   55.0 ml 21.22 ml/m  RA Volume:   29.70 ml  11.46 ml/m  LA Vol (A4C):   61.4 ml 23.67 ml/m  LA Biplane Vol: 57.4 ml 22.15 ml/m   AORTIC VALVE                    PULMONIC VALVE  AV Area (Vmax):    3.25 cm     PV Vmax:       0.95 m/s  AV Area (Vmean):   3.23 cm     PV Peak grad:  3.6 mmHg  AV Area (VTI):     3.00 cm  AV Vmax:           111.00 cm/s  AV Vmean:          73.400 cm/s  AV VTI:            0.208 m  AV Peak Grad:      4.9 mmHg  AV Mean Grad:      3.0 mmHg  LVOT Vmax:         94.80 cm/s  LVOT Vmean:        62.400 cm/s  LVOT VTI:          0.164 m  LVOT/AV VTI  ratio: 0.79    AORTA  Ao Root diam: 3.30 cm  Ao Arch diam: 3.4 cm   MITRAL VALVE  MV Area (PHT): 2.46 cm    SHUNTS  MV Decel Time: 309 msec    Systemic VTI:  0.16 m  MV E velocity: 60.35 cm/s  Systemic Diam: 2.20 cm  MV A velocity: 76.75 cm/s  MV E/A ratio:  0.79   Mihai Croitoru MD  Electronically signed by Thurmon Fair MD  Signature Date/Time: 11/21/2022/3:32:43 PM     Final     Treatments: surgery:  01/02/2023 Surgeon:  [name], MD First Assistant: Gershon Crane, PA-C:    Preoperative Diagnosis:  Severe multi-vessel coronary artery disease Postoperative Diagnosis:  Same Procedure: Median Sternotomy Extracorporeal circulation 3.   Coronary artery bypass grafting x 4 Left internal mammary artery graft to the LAD SVG to diagonal 2 SVG to OM SVG to PDA 4.   Endoscopic vein harvest from the right and left leg  CARDIOVASCULAR SURGERY OPERATIVE NOTE 01/08/2023 Surgeon:  [name], MD First Assistant: RNFA Preoperative Diagnosis: Sternal dehiscence status post coronary bypass graft surgery Postoperative Diagnosis:  Same Procedure: Sternal rewiring     Bryan Brock, Bryan Brock MEDICAL RECORD NO: [id number] ACCOUNT NO: [id number] DATE OF BIRTH: [birth year] FACILITY: [facility] LOCATION: [facility] PHYSICIAN: Kerin Perna III, MD Operative Report  DATE OF PROCEDURE: 01/20/2023 OPERATION: Incision and drainage of peristernal fluid collection extending into the left chest wall with placement of wound VAC. 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 SURGEON:  Kerin Perna, MD  DATE OF OPERATION:  01/23/2023 LOCATION: [facility] Inpatient PREOPERATIVE DIAGNOSIS: Sternal wound after CABG POSTOPERATIVE DIAGNOSIS: Same PROCEDURE: Excision of sternal wound skin, soft tissue 5 x 15 x 3.5 cm 5 cm of intermediate closure of the wound Placement of Myriad 10 x 10 cm sheet and 2 gm powder Placement of VAC  SURGEON: Foster Simpson, DO DATE OF OPERATION: 01/29/2023 LOCATION: [facility] PREOPERATIVE DIAGNOSIS: Sternal wound POSTOPERATIVE DIAGNOSIS: Same PROCEDURE:  Excision of sternal wound 5  x 14 x 3.5 cm skin and soft tissue (2 x 2 cm) Placement of Myriad 1 gm powder Placement of VAC SURGEON: Claire Sanger Dillingham, DO  Discharge Exam: Blood pressure 107/62, pulse 69, temperature 98.2 F (36.8 C), temperature source Oral, resp. rate 20, height 5' 10.98" (1.803 m), weight (!) 139.7 kg, SpO2 97 %. {physical [id number]   Discharge Medications:  The patient has been discharged on:   1.Beta Blocker:  Yes [ X  ]                              No   [   ]                              If No, reason:  2.Ace Inhibitor/ARB: Yes [   ]                                     No  [  X  ]                                     If No, reason: Hypotension  3.Statin:   Yes [ X  ]                  No  [   ]                  If No, reason:  4.Ecasa:  Yes  [  X ]                  No   [   ]                  If No, reason:  Patient had ACS upon admission: No  Plavix/P2Y12 inhibitor: Yes [   ]                                      No  [  X ]     Discharge Instructions     Amb Referral to Cardiac Rehabilitation   Complete by: As directed    Diagnosis: CABG   CABG X ___: 4   After initial evaluation and assessments completed: Virtual Based Care may be provided alone or in conjunction with Phase 2 Cardiac Rehab based on patient barriers.: Yes   Intensive Cardiac Rehabilitation (ICR) [facility] location only OR Traditional Cardiac Rehabilitation (TCR) *If  criteria for ICR are not met will enroll in TCR [facility] only): Yes      Allergies as of 01/30/2023       Reactions   Latex Dermatitis   Lisinopril Cough     Med Rec must be completed prior to using this [facility]***        Durable Medical Equipment  (From admission, onward)           Start     Ordered   01/25/23 0829  For home use only DME Walker wide  Once       Comments: Pt needs bariatric rolling walker (2 wheels) Weight 305 pounds, Height 5'11"  Question Answer Comment  Patient needs a walker to treat with the following condition S/P CABG x 4   Patient needs a walker to treat with the following condition Physical deconditioning      01/25/23 0829   01/24/23 1515  For home use only DME Vac  Once        01/24/23 1514            Follow-up Information     [name], MD Follow up on 02/19/2023.   Specialty: Cardiothoracic Surgery Why: Follow up appointment is at 4:30PM Contact information: [address] [employer] [address] [place] [address] [phone]         [facility] Follow up on 02/19/2023.   Why: To get chest xray at 3:30PM Contact information: [address] [place] [address]        Loyola Mast, MD Follow up on 02/18/2023.   Specialty: Family Medicine Why: Appointment is at 11:00AM for follow up. Preop HGA1C 6.6, history of pre diabetes. Contact information: [address] [place] [address] [phone]         Sharlene Dory, PA-C Follow up on 02/11/2023.   Specialty: Cardiology Why: Cardiology follow up is at 10:55AM Contact information: [address] [place] [address] [phone]         Peggye Form, DO. Call today.   Specialty: Plastic Surgery Why:  to make a 1 week appointment with plastic surgery Contact information: [address] [place] [address] [phone]                 Signed:  Jenny Reichmann,  PA-C  01/30/2023, 1:38 PM

## 2023-01-03 NOTE — Addendum Note (Signed)
Addendum  created 01/03/23 1115 by Shelton Silvas, MD   Intraprocedure Staff edited

## 2023-01-03 NOTE — Progress Notes (Signed)
1 Day Post-Op Procedure(s) (LRB): CORONARY ARTERY BYPASS GRAFTING (CABG) TIMES FOUR USING THE LEFT INTERNAL MAMMARY ARTERY (LIMA) AND BILATERAL GREATER SAPHENOUS VEIN ARTERIES (N/A) TRANSESOPHAGEAL ECHOCARDIOGRAM (N/A) Subjective: Sore but otherwise feels ok  Objective: Vital signs in last 24 hours: Temp:  [93.6 F (34.2 C)-99 F (37.2 C)] 98 F (36.7 C) (05/10 0311) Pulse Rate:  [67-96] 86 (05/10 0515) Cardiac Rhythm: Normal sinus rhythm (05/10 0400) Resp:  [13-37] 24 (05/10 0515) SpO2:  [91 %-99 %] 94 % (05/10 0515) Arterial Line BP: (86-143)/(39-74) 109/48 (05/10 0515) FiO2 (%):  [40 %-50 %] 40 % (05/09 1746)  Hemodynamic parameters for last 24 hours: PAP: (16-48)/(-23-26) 30/5  Intake/Output from previous day: 05/09 0701 - 05/10 0700 In: 4942.1 [I.V.:3114.2; Blood:450; IV Piggyback:1377.9] Out: 3425 [Urine:2060; Blood:915; Chest Tube:450] Intake/Output this shift: Total I/O In: 766.2 [I.V.:391.4; IV Piggyback:374.9] Out: 770 [Urine:500; Chest Tube:270]  General appearance: alert and cooperative Neurologic: intact Heart: regular rate and rhythm, S1, S2 normal, no murmur Lungs: clear to auscultation bilaterally Extremities: edema mild Wound: dressing dry  Lab Results: Recent Labs    01/02/23 1427 01/02/23 1428 01/02/23 1813 01/03/23 0341  WBC 11.9*  --   --  6.9  HGB 12.1*   < > 11.9* 11.1*  HCT 37.4*   < > 35.0* 32.7*  PLT 154  --   --  136*   < > = values in this interval not displayed.   BMET:  Recent Labs    12/31/22 1142 01/02/23 0817 01/02/23 1259 01/02/23 1428 01/02/23 1813 01/03/23 0341  NA 131*   < > 137   < > 138 133*  K 4.2   < > 5.0   < > 4.7 4.1  CL 100   < > 102  --   --  102  CO2 20*  --   --   --   --  24  GLUCOSE 128*   < > 153*  --   --  131*  BUN 18   < > 23  --   --  19  CREATININE 1.26*   < > 1.10  --   --  1.33*  CALCIUM 9.2  --   --   --   --  8.2*   < > = values in this interval not displayed.    PT/INR:  Recent Labs     01/02/23 1427  LABPROT 16.5*  INR 1.3*   ABG    Component Value Date/Time   PHART 7.321 (L) 01/02/2023 1813   HCO3 23.4 01/02/2023 1813   TCO2 25 01/02/2023 1813   ACIDBASEDEF 3.0 (H) 01/02/2023 1813   O2SAT 94 01/02/2023 1813   CBG (last 3)  Recent Labs    01/03/23 0201 01/03/23 0343 01/03/23 0614  GLUCAP 138* 140* 141*   CXR: clear  ECG: pending  Assessment/Plan: S/P Procedure(s) (LRB): CORONARY ARTERY BYPASS GRAFTING (CABG) TIMES FOUR USING THE LEFT INTERNAL MAMMARY ARTERY (LIMA) AND BILATERAL GREATER SAPHENOUS VEIN ARTERIES (N/A) TRANSESOPHAGEAL ECHOCARDIOGRAM (N/A)  POD 1 Hemodynamically stable in sinus rhythm. Continue Lopressor.  Volume excess: wt is pending this am. Will hold on diuresis since just got off neo and creat 1.33.  DM: on no meds at home. Hgb A1c 6.6 preop. Transition to SSI and Levemir.  Dangle and DC chest tubes.  DC arterial line  IS, OOB.   LOS: 1 day    [name] 01/03/2023

## 2023-01-03 NOTE — Progress Notes (Signed)
Patient ID: Bryan Brock, male   DOB: 04/30/1956, 67 y.o.   MRN: [id number] TCTS Afternoon rounds:  Hemodynamically stable today off neo. Tubes out.  UO ok  Sitting up in chair and feels ok.  Afternoon labs pending.

## 2023-01-03 NOTE — TOC Initial Note (Signed)
Transition of Care [facility]) - Initial/Assessment Note    Patient Details  Name: Bryan Brock MRN: [id number] Date of Birth: Dec 31, 1955  Transition of Care [facility]) CM/SW Contact:    Gala Lewandowsky, RN Phone Number: 01/03/2023, 11:28 AM  Clinical Narrative: Patient presented for chest pain- POD-1 CABG. PTA patient was independent from home with significant other. Case Manager will continue to follow for transition of care needs as the patient progresses.                 Expected Discharge Plan: Home/Self Care Barriers to Discharge: Continued Medical Work up   Patient Goals and CMS Choice Patient states their goals for this hospitalization and ongoing recovery are:: to return home.    Expected Discharge Plan and Services In-house Referral: NA Discharge Planning Services: CM Consult   Living arrangements for the past 2 months: Single Family Home    HH Arranged: NA   Prior Living Arrangements/Services Living arrangements for the past 2 months: Single Family Home Lives with:: Significant Other Patient language and need for interpreter reviewed:: Yes Do you feel safe going back to the place where you live?: Yes      Need for Family Participation in Patient Care: Yes (Comment) Care giver support system in place?: Yes (comment)   Criminal Activity/Legal Involvement Pertinent to Current Situation/Hospitalization: No - Comment as needed  Activities of Daily Living      Permission Sought/Granted Permission sought to share information with : Family Supports, Case Manager    Emotional Assessment Appearance:: Appears stated age Attitude/Demeanor/Rapport: Engaged Affect (typically observed): Appropriate Orientation: : Oriented to Self, Oriented to Place, Oriented to  Time, Oriented to Situation Alcohol / Substance Use: Not Applicable Psych Involvement: No (comment)  Admission diagnosis:  S/P CABG x 4 [Z95.1] Patient Active Problem List   Diagnosis Date Noted   S/P  CABG x 4 01/02/2023   OSA (obstructive sleep apnea) 12/19/2021   Essential hypertension 10/29/2021   Elevated transaminase level 10/18/2021   Prediabetes 10/17/2021   Morbid obesity with BMI of 40.0-44.9, adult (HCC) 10/17/2021   Bipolar 1 disorder, depressed (HCC) 05/11/2020   History of basal cell carcinoma 11/05/2007   Hyperlipidemia 11/05/2007   Premature ventricular contractions 11/05/2007   History of mitral valve prolapse 11/05/2007   PCP:  Loyola Mast, MD Pharmacy:   [facility] [phone] - [place],  - [address] AT [facility] OF [facility] [address]  [facility] [place] [address] Phone: [phone] Fax: [phone]   Social Determinants of Health (SDOH) Social History: SDOH Screenings   Food Insecurity: No Food Insecurity (03/05/2022)  Housing: Low Risk  (03/05/2022)  Transportation Needs: No Transportation Needs (03/05/2022)  Alcohol Screen: Low Risk  (03/05/2022)  Depression (PHQ2-9): Low Risk  (10/31/2022)  Financial Resource Strain: Low Risk  (03/05/2022)  Physical Activity: Inactive (03/05/2022)  Social Connections: Moderately Integrated (03/05/2022)  Stress: No Stress Concern Present (03/05/2022)  Tobacco Use: High Risk (01/02/2023)   Readmission Risk Interventions     No data to display

## 2023-01-04 ENCOUNTER — Inpatient Hospital Stay (HOSPITAL_COMMUNITY): Payer: Medicare Other

## 2023-01-04 LAB — BASIC METABOLIC PANEL
Anion gap: 8 (ref 5–15)
BUN: 16 mg/dL (ref 8–23)
CO2: 25 mmol/L (ref 22–32)
Calcium: 8 mg/dL — ABNORMAL LOW (ref 8.9–10.3)
Chloride: 95 mmol/L — ABNORMAL LOW (ref 98–111)
Creatinine, Ser: 1.19 mg/dL (ref 0.61–1.24)
GFR, Estimated: >60 mL/min (ref >60)
Glucose, Bld: 139 mg/dL — ABNORMAL HIGH (ref 70–99)
Potassium: 3.4 mmol/L — ABNORMAL LOW (ref 3.5–5.1)
Sodium: 128 mmol/L — ABNORMAL LOW (ref 135–145)

## 2023-01-04 LAB — CBC
HCT: 31.3 % — ABNORMAL LOW (ref 39.0–52.0)
Hemoglobin: 10 g/dL — ABNORMAL LOW (ref 13.0–17.0)
MCH: 30.5 pg (ref 26.0–34.0)
MCHC: 31.9 g/dL (ref 30.0–36.0)
MCV: 95.4 fL (ref 80.0–100.0)
Platelets: 102 10*3/uL — ABNORMAL LOW (ref 150–400)
RBC: 3.28 MIL/uL — ABNORMAL LOW (ref 4.22–5.81)
RDW: 13.2 % (ref 11.5–15.5)
WBC: 7.6 10*3/uL (ref 4.0–10.5)
nRBC: 0 % (ref 0.0–0.2)

## 2023-01-04 LAB — GLUCOSE, CAPILLARY
Glucose-Capillary: 129 mg/dL — ABNORMAL HIGH (ref 70–99)
Glucose-Capillary: 179 mg/dL — ABNORMAL HIGH (ref 70–99)
Glucose-Capillary: 112 mg/dL — ABNORMAL HIGH (ref 70–99)
Glucose-Capillary: 121 mg/dL — ABNORMAL HIGH (ref 70–99)
Glucose-Capillary: 128 mg/dL — ABNORMAL HIGH (ref 70–99)

## 2023-01-04 MED ORDER — SODIUM CHLORIDE 0.9 % IV SOLN: 250.0000 mL | INTRAVENOUS | Status: DC | PRN

## 2023-01-04 MED ORDER — BISACODYL 5 MG PO TBEC
10.0000 mg | DELAYED_RELEASE_TABLET | Freq: Every day | ORAL | Status: DC | PRN
  Administered 2023-01-06: 10 mg via ORAL
  Filled 2023-01-04: qty 2

## 2023-01-04 MED ORDER — BISACODYL 10 MG RE SUPP: 10.0000 mg | Freq: Every day | RECTAL | Status: DC | PRN

## 2023-01-04 MED ORDER — DOCUSATE SODIUM 100 MG PO CAPS
200.0000 mg | ORAL_CAPSULE | Freq: Every day | ORAL | Status: DC
  Administered 2023-01-04 – 2023-01-06 (×3): 200 mg via ORAL
  Filled 2023-01-04 (×4): qty 2

## 2023-01-04 MED ORDER — SODIUM CHLORIDE 0.9% FLUSH
3.0000 mL | Freq: Two times a day (BID) | INTRAVENOUS | Status: DC
  Administered 2023-01-04 – 2023-01-06 (×5): 3 mL via INTRAVENOUS

## 2023-01-04 MED ORDER — POTASSIUM CHLORIDE CRYS ER 20 MEQ PO TBCR: 20.0000 meq | EXTENDED_RELEASE_TABLET | Freq: Two times a day (BID) | ORAL | Status: DC

## 2023-01-04 MED ORDER — SODIUM CHLORIDE 0.9% FLUSH: 3.0000 mL | INTRAVENOUS | Status: DC | PRN

## 2023-01-04 MED ORDER — INSULIN ASPART 100 UNIT/ML IJ SOLN
0.0000 [IU] | Freq: Three times a day (TID) | INTRAMUSCULAR | Status: DC
  Administered 2023-01-04: 4 [IU] via SUBCUTANEOUS

## 2023-01-04 MED ORDER — ~~LOC~~ CARDIAC SURGERY, PATIENT & FAMILY EDUCATION: Freq: Once | Status: AC

## 2023-01-04 MED ORDER — ASPIRIN 325 MG PO TBEC
325.0000 mg | DELAYED_RELEASE_TABLET | Freq: Every day | ORAL | Status: DC
  Administered 2023-01-04 – 2023-01-06 (×3): 325 mg via ORAL
  Filled 2023-01-04 (×4): qty 1

## 2023-01-04 MED ORDER — FUROSEMIDE 10 MG/ML IJ SOLN
40.0000 mg | Freq: Two times a day (BID) | INTRAMUSCULAR | Status: AC
  Administered 2023-01-04 (×2): 40 mg via INTRAVENOUS
  Filled 2023-01-04 (×2): qty 4

## 2023-01-04 MED ORDER — POTASSIUM CHLORIDE CRYS ER 20 MEQ PO TBCR
40.0000 meq | EXTENDED_RELEASE_TABLET | Freq: Once | ORAL | Status: AC
  Administered 2023-01-04: 40 meq via ORAL
  Filled 2023-01-04: qty 2

## 2023-01-04 MED ORDER — FUROSEMIDE 40 MG PO TABS
40.0000 mg | ORAL_TABLET | Freq: Every day | ORAL | Status: DC
  Administered 2023-01-05 – 2023-01-06 (×2): 40 mg via ORAL
  Filled 2023-01-04 (×2): qty 1

## 2023-01-04 MED ORDER — POTASSIUM CHLORIDE CRYS ER 20 MEQ PO TBCR
20.0000 meq | EXTENDED_RELEASE_TABLET | ORAL | Status: AC
  Administered 2023-01-04 (×3): 20 meq via ORAL
  Filled 2023-01-04 (×3): qty 1

## 2023-01-04 MED ORDER — METOPROLOL TARTRATE 12.5 MG HALF TABLET
12.5000 mg | ORAL_TABLET | Freq: Two times a day (BID) | ORAL | Status: DC
  Administered 2023-01-04: 12.5 mg via ORAL
  Filled 2023-01-04: qty 1

## 2023-01-04 NOTE — Progress Notes (Signed)
2 Days Post-Op Procedure(s) (LRB): CORONARY ARTERY BYPASS GRAFTING (CABG) TIMES FOUR USING THE LEFT INTERNAL MAMMARY ARTERY (LIMA) AND BILATERAL GREATER SAPHENOUS VEIN ARTERIES (N/A) TRANSESOPHAGEAL ECHOCARDIOGRAM (N/A) Subjective: No complaints. Pain is better. Ambulated some this am.  Objective: Vital signs in last 24 hours: Temp:  [97.8 F (36.6 C)-99.2 F (37.3 C)] 99.2 F (37.3 C) (05/11 0309) Pulse Rate:  [75-114] 92 (05/11 0715) Cardiac Rhythm: Normal sinus rhythm (05/11 0400) Resp:  [15-36] 25 (05/11 0715) BP: (96-130)/(53-116) 130/116 (05/11 0700) SpO2:  [79 %-100 %] 93 % (05/11 0715) Arterial Line BP: (107-117)/(45-70) 117/54 (05/10 1000) Weight:  [150.3 kg] 150.3 kg (05/11 0500)  Hemodynamic parameters for last 24 hours:    Intake/Output from previous day: 05/10 0701 - 05/11 0700 In: 1428 [P.O.:840; I.V.:288; IV Piggyback:300] Out: 1255 [Urine:1215; Chest Tube:40] Intake/Output this shift: No intake/output data recorded.  General appearance: alert and cooperative Neurologic: intact Heart: regular rate and rhythm, S1, S2 normal, no murmur Lungs: clear to auscultation bilaterally Extremities: edema mild Wound: dressing dry  Lab Results: Recent Labs    01/03/23 1643 01/04/23 0413  WBC 8.8 7.6  HGB 11.1* 10.0*  HCT 34.7* 31.3*  PLT 120* 102*   BMET:  Recent Labs    01/03/23 1643 01/04/23 0413  NA 130* 128*  K 4.0 3.4*  CL 96* 95*  CO2 24 25  GLUCOSE 142* 139*  BUN 21 16  CREATININE 1.37* 1.19  CALCIUM 8.2* 8.0*    PT/INR:  Recent Labs    01/02/23 1427  LABPROT 16.5*  INR 1.3*   ABG    Component Value Date/Time   PHART 7.321 (L) 01/02/2023 1813   HCO3 23.4 01/02/2023 1813   TCO2 25 01/02/2023 1813   ACIDBASEDEF 3.0 (H) 01/02/2023 1813   O2SAT 94 01/02/2023 1813   CBG (last 3)  Recent Labs    01/03/23 1935 01/03/23 2329 01/04/23 0307  GLUCAP 152* 107* 129*   CXR: ok  Assessment/Plan: S/P Procedure(s) (LRB): CORONARY ARTERY  BYPASS GRAFTING (CABG) TIMES FOUR USING THE LEFT INTERNAL MAMMARY ARTERY (LIMA) AND BILATERAL GREATER SAPHENOUS VEIN ARTERIES (N/A) TRANSESOPHAGEAL ECHOCARDIOGRAM (N/A)  POD2  Hemodynamically stable in sinus rhythm. Continue Lopressor. Will remove pacing wires today.  Volume excess: did not diurese him yesterday since still on neo in the morning and slight bump in creat. He is about 17 lbs over preop if accurate. Start diuresis and replace K+  DC sleeve and foley.  DM: glucose under adequate control. Continue Levemir and SSI.  Transfer to 4E and continue IS, ambulation.   LOS: 2 days    [name] 01/04/2023

## 2023-01-05 LAB — BASIC METABOLIC PANEL
Anion gap: 10 (ref 5–15)
BUN: 17 mg/dL (ref 8–23)
CO2: 26 mmol/L (ref 22–32)
Calcium: 8.2 mg/dL — ABNORMAL LOW (ref 8.9–10.3)
Chloride: 94 mmol/L — ABNORMAL LOW (ref 98–111)
Creatinine, Ser: 1.26 mg/dL — ABNORMAL HIGH (ref 0.61–1.24)
GFR, Estimated: >60 mL/min (ref >60)
Glucose, Bld: 150 mg/dL — ABNORMAL HIGH (ref 70–99)
Potassium: 3.6 mmol/L (ref 3.5–5.1)
Sodium: 130 mmol/L — ABNORMAL LOW (ref 135–145)

## 2023-01-05 LAB — CBC
HCT: 31.8 % — ABNORMAL LOW (ref 39.0–52.0)
Hemoglobin: 10.4 g/dL — ABNORMAL LOW (ref 13.0–17.0)
MCH: 30.9 pg (ref 26.0–34.0)
MCHC: 32.7 g/dL (ref 30.0–36.0)
MCV: 94.4 fL (ref 80.0–100.0)
Platelets: 121 10*3/uL — ABNORMAL LOW (ref 150–400)
RBC: 3.37 MIL/uL — ABNORMAL LOW (ref 4.22–5.81)
RDW: 13 % (ref 11.5–15.5)
WBC: 7.9 10*3/uL (ref 4.0–10.5)
nRBC: 0 % (ref 0.0–0.2)

## 2023-01-05 LAB — GLUCOSE, CAPILLARY
Glucose-Capillary: 152 mg/dL — ABNORMAL HIGH (ref 70–99)
Glucose-Capillary: 145 mg/dL — ABNORMAL HIGH (ref 70–99)
Glucose-Capillary: 112 mg/dL — ABNORMAL HIGH (ref 70–99)

## 2023-01-05 MED ORDER — AMIODARONE HCL IN DEXTROSE 360-4.14 MG/200ML-% IV SOLN
30.0000 mg/h | INTRAVENOUS | Status: AC
  Administered 2023-01-05 (×2): 30 mg/h via INTRAVENOUS
  Filled 2023-01-05 (×2): qty 200

## 2023-01-05 MED ORDER — AMIODARONE HCL IN DEXTROSE 360-4.14 MG/200ML-% IV SOLN
60.0000 mg/h | INTRAVENOUS | Status: AC
  Administered 2023-01-05 (×2): 60 mg/h via INTRAVENOUS
  Filled 2023-01-05 (×2): qty 200

## 2023-01-05 MED ORDER — POTASSIUM CHLORIDE CRYS ER 20 MEQ PO TBCR
30.0000 meq | EXTENDED_RELEASE_TABLET | Freq: Two times a day (BID) | ORAL | Status: AC
  Administered 2023-01-05 (×2): 30 meq via ORAL
  Filled 2023-01-05 (×2): qty 1

## 2023-01-05 MED ORDER — SORBITOL 70 % SOLN
30.0000 mL | Freq: Once | Status: AC
  Administered 2023-01-05: 30 mL via ORAL
  Filled 2023-01-05: qty 30

## 2023-01-05 MED ORDER — AMIODARONE LOAD VIA INFUSION
150.0000 mg | Freq: Once | INTRAVENOUS | Status: AC
  Administered 2023-01-05: 150 mg via INTRAVENOUS
  Filled 2023-01-05: qty 83.34

## 2023-01-05 MED ORDER — ALPRAZOLAM 0.25 MG PO TABS
0.2500 mg | ORAL_TABLET | Freq: Two times a day (BID) | ORAL | Status: DC | PRN
  Administered 2023-01-05: 0.25 mg via ORAL
  Filled 2023-01-05: qty 1

## 2023-01-05 MED ORDER — POTASSIUM CHLORIDE CRYS ER 20 MEQ PO TBCR: 20.0000 meq | EXTENDED_RELEASE_TABLET | Freq: Every day | ORAL | Status: DC

## 2023-01-05 MED ORDER — METOPROLOL TARTRATE 25 MG PO TABS
25.0000 mg | ORAL_TABLET | Freq: Two times a day (BID) | ORAL | Status: DC
  Administered 2023-01-05 – 2023-01-07 (×5): 25 mg via ORAL
  Filled 2023-01-05 (×4): qty 1

## 2023-01-05 NOTE — Progress Notes (Addendum)
      [address]       [place] [address]             [phone]        3 Days Post-Op Procedure(s) (LRB): CORONARY ARTERY BYPASS GRAFTING (CABG) TIMES FOUR USING THE LEFT INTERNAL MAMMARY ARTERY (LIMA) AND BILATERAL GREATER SAPHENOUS VEIN ARTERIES (N/A) TRANSESOPHAGEAL ECHOCARDIOGRAM (N/A)  Subjective: Patient tried to have a bowel movement this am but unsuccessful. He is asking for a laxative  Objective: Vital signs in last 24 hours: Temp:  [98.2 F (36.8 C)-98.9 F (37.2 C)] 98.2 F (36.8 C) (05/12 0431) Pulse Rate:  [60-110] 110 (05/12 0431) Cardiac Rhythm: Normal sinus rhythm (05/11 1939) Resp:  [18-32] 20 (05/12 0431) BP: (109-143)/(57-80) 115/70 (05/12 0431) SpO2:  [90 %-97 %] 95 % (05/12 0431) Weight:  [149.6 kg] 149.6 kg (05/12 0407)  Pre op weight  142.4 kg Current Weight  01/05/23 (!) 149.6 kg      Intake/Output from previous day: 05/11 0701 - 05/12 0700 In: 478.2 [P.O.:360; I.V.:118.2] Out: 1850 [Urine:1850]   Physical Exam:  Cardiovascular: IRRR IRRR Pulmonary: Diminished bibasilar breath sounds Abdomen: Soft, obese, non tender, bowel sounds present. Extremities: Bilateral lower extremity edema. Wounds: Sternal dressing removed and wound is clean and dry.  No erythema or signs of infection. Bilateral LE wounds are clean and dry  Lab Results: CBC: Recent Labs    01/04/23 0413 01/05/23 0136  WBC 7.6 7.9  HGB 10.0* 10.4*  HCT 31.3* 31.8*  PLT 102* 121*   BMET:  Recent Labs    01/04/23 0413 01/05/23 0136  NA 128* 130*  K 3.4* 3.6  CL 95* 94*  CO2 25 26  GLUCOSE 139* 150*  BUN 16 17  CREATININE 1.19 1.26*  CALCIUM 8.0* 8.2*    PT/INR:  Lab Results  Component Value Date   INR 1.3 (H) 01/02/2023   INR 1.0 12/31/2022   ABG:  INR: Will add last result for INR, ABG once components are confirmed Will add last 4 CBG results once components are confirmed  Assessment/Plan:  1. CV - He went into a fib early  05/12. Is still in a fib with HR in the 110's this am. On Amiodarone drip and Lopressor 12.5 mg bid. Will increase Lopressor to 25 mg bid. Will transition to oral Amiodarone once rate better controlled or converts to SR. 2.  Pulmonary - On 2 liters of oxygen via . Wean as able. Encourage incentive spirometer 3. Volume Overload - On Lasix 40 mg daily 4.  Expected post op acute blood loss anemia - H and H this am stable at 10.4 and 31.8 5. DM-CBGs 121/128/152. Per op HGA1C 6.6. His HGA1C 10 months ago was 6.4. He will need to follow up with medical doctor after discharge. Will provide nutritional information with discharge paperwork. 6. Hyponatremia-sodium 130 this am. Likely related to diuresis 7. Creatinine slightly increased from 1.19 to 1.26. Likely related to diuresis. Re check in am 8. Mild thrombocytopenia-platelets this am up to 121,000 9. Supplement potassium 10. LOC constipation  Donielle M ZimmermanPA-C 7:56 AM   Chart reviewed, patient examined, agree with above. Postop atrial fib with RVR. Continue IV amio today. Lopressor increased.

## 2023-01-05 NOTE — Discharge Instructions (Addendum)
Discharge Instructions:  1. You may shower, please wash incisions daily with soap and water and keep dry.  If you wish to cover wounds with dressing you may do so but please keep clean and change daily.  No tub baths or swimming until incisions have completely healed.  If your incisions become red or develop any drainage please call our office at [phone]  2. No Driving until cleared by Dr. Sharee Pimple office and you are no longer using narcotic pain medications  3. Monitor your weight daily.. Please use the same scale and weigh at same time... If you gain 5-10 lbs in 48 hours with associated lower extremity swelling, please contact our office at [phone]  4. Fever of 101.5 for at least 24 hours with no source, please contact our office at [phone]  5. Activity- up as tolerated, please walk at least 3 times per day.  Avoid strenuous activity, no lifting, pushing, or pulling with your arms over 8-10 lbs for a minimum of 6 weeks  6. If any questions or concerns arise, please do not hesitate to contact our office at [phone]  Prediabetes Eating Plan Prediabetes is a condition that causes blood sugar (glucose) levels to be higher than normal. This increases the risk for developing type 2 diabetes (type 2 diabetes mellitus). Working with a health care provider or nutrition specialist (dietitian) to make diet and lifestyle changes can help prevent the onset of diabetes. These changes may help you: Control your blood glucose levels. Improve your cholesterol levels. Manage your blood pressure. What are tips for following this plan? Reading food labels Read food labels to check the amount of fat, salt (sodium), and sugar in prepackaged foods. Avoid foods that have: Saturated fats. Trans fats. Added sugars. Avoid foods that have more than 300 milligrams (mg) of sodium per serving. Limit your sodium intake to less than 2,300 mg each day. Shopping Avoid buying pre-made and processed  foods. Avoid buying drinks with added sugar. Cooking Cook with olive oil. Do not use butter, lard, or ghee. Bake, broil, grill, steam, or boil foods. Avoid frying. Meal planning  Work with your dietitian to create an eating plan that is right for you. This may include tracking how many calories you take in each day. Use a food diary, notebook, or mobile application to track what you eat at each meal. Consider following a Mediterranean diet. This includes: Eating several servings of fresh fruits and vegetables each day. Eating fish at least twice a week. Eating one serving each day of whole grains, beans, nuts, and seeds. Using olive oil instead of other fats. Limiting alcohol. Limiting red meat. Using nonfat or low-fat dairy products. Consider following a plant-based diet. This includes dietary choices that focus on eating mostly vegetables and fruit, grains, beans, nuts, and seeds. If you have high blood pressure, you may need to limit your sodium intake or follow a diet such as the DASH (Dietary Approaches to Stop Hypertension) eating plan. The DASH diet aims to lower high blood pressure. Lifestyle Set weight loss goals with help from your health care team. It is recommended that most people with prediabetes lose 7% of their body weight. Exercise for at least 30 minutes 5 or more days a week. Attend a support group or seek support from a mental health counselor. Take over-the-counter and prescription medicines only as told by your health care provider. What foods are recommended? Fruits Berries. Bananas. Apples. Oranges. Grapes. Papaya. Mango. Pomegranate. Kiwi. Grapefruit. Cherries. Vegetables Lettuce. Spinach.  Peas. Beets. Cauliflower. Cabbage. Broccoli. Carrots. Tomatoes. Squash. Eggplant. Herbs. Peppers. Onions. Cucumbers. Brussels sprouts. Grains Whole grains, such as whole-wheat or whole-grain breads, crackers, cereals, and pasta. Unsweetened oatmeal. Bulgur. Barley. Quinoa.  Brown rice. Corn or whole-wheat flour tortillas or taco shells. Meats and other proteins Seafood. Poultry without skin. Lean cuts of pork and beef. Tofu. Eggs. Nuts. Beans. Dairy Low-fat or fat-free dairy products, such as yogurt, cottage cheese, and cheese. Beverages Water. Tea. Coffee. Sugar-free or diet soda. Seltzer water. Low-fat or nonfat milk. Milk alternatives, such as soy or almond milk. Fats and oils Olive oil. Canola oil. Sunflower oil. Grapeseed oil. Avocado. Walnuts. Sweets and desserts Sugar-free or low-fat pudding. Sugar-free or low-fat ice cream and other frozen treats. Seasonings and condiments Herbs. Sodium-free spices. Mustard. Relish. Low-salt, low-sugar ketchup. Low-salt, low-sugar barbecue sauce. Low-fat or fat-free mayonnaise. The items listed above may not be a complete list of recommended foods and beverages. Contact a dietitian for more information. What foods are not recommended? Fruits Fruits canned with syrup. Vegetables Canned vegetables. Frozen vegetables with butter or cream sauce. Grains Refined white flour and flour products, such as bread, pasta, snack foods, and cereals. Meats and other proteins Fatty cuts of meat. Poultry with skin. Breaded or fried meat. Processed meats. Dairy Full-fat yogurt, cheese, or milk. Beverages Sweetened drinks, such as iced tea and soda. Fats and oils Butter. Lard. Ghee. Sweets and desserts Baked goods, such as cake, cupcakes, pastries, cookies, and cheesecake. Seasonings and condiments Spice mixes with added salt. Ketchup. Barbecue sauce. Mayonnaise. The items listed above may not be a complete list of foods and beverages that are not recommended. Contact a dietitian for more information. Where to find more information American Diabetes Association: www.diabetes.org Summary You may need to make diet and lifestyle changes to help prevent the onset of diabetes. These changes can help you control blood sugar, improve  cholesterol levels, and manage blood pressure. Set weight loss goals with help from your health care team. It is recommended that most people with prediabetes lose 7% of their body weight. Consider following a Mediterranean diet. This includes eating plenty of fresh fruits and vegetables, whole grains, beans, nuts, seeds, fish, and low-fat dairy, and using olive oil instead of other fats. This information is not intended to replace advice given to you by your health care provider. Make sure you discuss any questions you have with your health care provider. Document Revised: 11/11/2019 Document Reviewed: 11/11/2019 Elsevier Patient Education  2023 Elsevier Inc.   Wound Care   Guide to Wound Care  Proper wound care may reduce the risk of infection, improve healing rates, and limit scarring.  This is a general guide to help care for and manage wounds treated with product wound matrix.   Dressing Changes The frequency of dressing changes can vary based on which product was applied, the size of the wound, or the amount of wound drainage. Dressing inspections are recommended, at least weekly.   If you have a Wound VAC it will be changed in one week after the first time it is applied.  Then it will be changed once or twice a week.    Dressing Types Primary Dressing:  Non-adherent dressing goes directly over wounds being treated with the powder or sheet.  Secondary Dressing:  Secures the primary dressing in place and provides extra protection, compression, and absorption.  1. Wash Hands - To help decrease the risk of infection, caregivers should wash their hands for a minimum of 20  seconds and may use medical gloves.   2. Remove the Dressings - Avoid removing product from the wound by carefully removing the applicable dressing(s) at the time points recommended above, or as recommended by the treating physician.  Expected Color and Odor:  It is entirely normal for the wound to have an unpleasant odor  and to form a caramel-colored gel as the product absorbs into the wound. It is important to leave this gel on the wound site.  3. Clean the Wound - Use clean water or saline to gently rinse around the wound surface and remove any excess discharge that may be present on the wound. Do not wipe off any of the caramel-colored gel on the wound.   What to look out for:  Large or increased amount of drainage   Surrounding skin has worsening redness or hot to touch   Increased pain in or around the wound   Flu-like symptoms, fatigue, decreased appetite, fever   Hard, crusty wound surface with black or brown coloring  4. Apply New Dressings - Dressings should cover the entire wound and be suitable for maintaining a moist wound environment.  The non-adherent mesh dressing should be left in place.  New dressing should consist of [place] Jelly to keep the wound moist and soft gauze secured with a wrap or tape.   Maintain a Hydrated Wound Area It is important to keep the wound area moist throughout the healing process. If the wound appears to be dry during dressing changes, select a dressing that will hydrate the wound and maintain that ideal moist environment. If you are unsure what to do, ask the treating physician.  Remodeling Process Every patient heals differently, and no two cases are the same. The size and location of the wound, product type and layering configurations, and general patient health all contribute to how quickly a wound will heal.  While many factors can influence the rate at which the product absorbs, the following can be used as a general guide.   THINGS TO DO: Refrain from smoking High protein diet with plenty of vegetables and some fruit  Limit simple processed carbohydrates and sugar Protect the wound from trauma Protect the dressing  powder       Sheet            Sorbact dressing

## 2023-01-06 ENCOUNTER — Inpatient Hospital Stay (HOSPITAL_COMMUNITY): Payer: Medicare Other

## 2023-01-06 LAB — BASIC METABOLIC PANEL
Anion gap: 4 — ABNORMAL LOW (ref 5–15)
BUN: 15 mg/dL (ref 8–23)
CO2: 28 mmol/L (ref 22–32)
Calcium: 8.2 mg/dL — ABNORMAL LOW (ref 8.9–10.3)
Chloride: 99 mmol/L (ref 98–111)
Creatinine, Ser: 1.16 mg/dL (ref 0.61–1.24)
GFR, Estimated: >60 mL/min (ref >60)
Glucose, Bld: 128 mg/dL — ABNORMAL HIGH (ref 70–99)
Potassium: 4.1 mmol/L (ref 3.5–5.1)
Sodium: 131 mmol/L — ABNORMAL LOW (ref 135–145)

## 2023-01-06 LAB — PREPARE RBC (CROSSMATCH)

## 2023-01-06 LAB — TYPE AND SCREEN

## 2023-01-06 LAB — BPAM RBC

## 2023-01-06 LAB — GLUCOSE, CAPILLARY
Glucose-Capillary: 131 mg/dL — ABNORMAL HIGH (ref 70–99)
Glucose-Capillary: 120 mg/dL — ABNORMAL HIGH (ref 70–99)
Glucose-Capillary: 95 mg/dL (ref 70–99)
Glucose-Capillary: 114 mg/dL — ABNORMAL HIGH (ref 70–99)

## 2023-01-06 MED ORDER — METOLAZONE 5 MG PO TABS
5.0000 mg | ORAL_TABLET | Freq: Once | ORAL | Status: AC
  Administered 2023-01-06: 5 mg via ORAL
  Filled 2023-01-06: qty 1

## 2023-01-06 MED ORDER — POTASSIUM CHLORIDE CRYS ER 20 MEQ PO TBCR
20.0000 meq | EXTENDED_RELEASE_TABLET | Freq: Three times a day (TID) | ORAL | Status: DC
  Administered 2023-01-06 – 2023-01-07 (×4): 20 meq via ORAL
  Filled 2023-01-06 (×4): qty 1

## 2023-01-06 MED ORDER — SODIUM CHLORIDE 0.9% IV SOLUTION: Freq: Once | INTRAVENOUS | Status: DC

## 2023-01-06 MED ORDER — LACTULOSE 10 GM/15ML PO SOLN
20.0000 g | Freq: Every day | ORAL | Status: DC
  Administered 2023-01-06: 20 g via ORAL
  Filled 2023-01-06: qty 30

## 2023-01-06 MED ORDER — CEFAZOLIN IN SODIUM CHLORIDE 3-0.9 GM/100ML-% IV SOLN
3.0000 g | INTRAVENOUS | Status: AC
  Administered 2023-01-07: 3 g via INTRAVENOUS
  Filled 2023-01-06 (×2): qty 100

## 2023-01-06 MED ORDER — HYDROCOD POLI-CHLORPHE POLI ER 10-8 MG/5ML PO SUER
5.0000 mL | Freq: Two times a day (BID) | ORAL | Status: DC
  Administered 2023-01-06 (×2): 5 mL via ORAL
  Filled 2023-01-06 (×2): qty 5

## 2023-01-06 MED ORDER — ALPRAZOLAM 0.5 MG PO TABS
0.5000 mg | ORAL_TABLET | Freq: Two times a day (BID) | ORAL | Status: DC | PRN
  Administered 2023-01-08 – 2023-02-01 (×8): 0.5 mg via ORAL
  Filled 2023-01-06 (×11): qty 1

## 2023-01-06 MED ORDER — OXYCODONE HCL 5 MG PO TABS
10.0000 mg | ORAL_TABLET | ORAL | Status: DC | PRN
  Administered 2023-01-06 – 2023-02-03 (×45): 10 mg via ORAL
  Filled 2023-01-06 (×47): qty 2

## 2023-01-06 MED ORDER — LIDOCAINE 5 % EX PTCH
2.0000 | MEDICATED_PATCH | CUTANEOUS | Status: DC
  Administered 2023-01-06 – 2023-01-23 (×18): 2 via TRANSDERMAL
  Filled 2023-01-06 (×18): qty 2

## 2023-01-06 MED ORDER — AMIODARONE HCL 200 MG PO TABS
400.0000 mg | ORAL_TABLET | Freq: Two times a day (BID) | ORAL | Status: DC
  Administered 2023-01-06 – 2023-01-07 (×4): 400 mg via ORAL
  Filled 2023-01-06 (×4): qty 2

## 2023-01-06 NOTE — Progress Notes (Signed)
Patient ID: Bryan Brock, male   DOB: [birth year], 67 y.o.   MRN: [id number]  CT chest shows that the lower two sternal wires have pulled through the sternum from coughing. There is some separation of the sternum at this point. The remainder of the sternum is intact. This will require rewiring in the OR tomorrow to prevent further disruption and infection. I discussed the procedure with the patient and his significant other and he understands and agrees to proceed. We discussed alternatives, benefits and risks; including but not limited to bleeding, blood transfusion, infection, injury to the heart, recurrent sternal dehiscence and non-union.   Bryan Brock understands and agrees to proceed.  We will schedule surgery for  tomorrow afternoon.

## 2023-01-06 NOTE — H&P (View-Only) (Signed)
Patient ID: Bryan Brock, male   DOB: 10/07/1955, 66 y.o.   MRN: [id number]  CT chest shows that the lower two sternal wires have pulled through the sternum from coughing. There is some separation of the sternum at this point. The remainder of the sternum is intact. This will require rewiring in the OR tomorrow to prevent further disruption and infection. I discussed the procedure with the patient and his significant other and he understands and agrees to proceed. We discussed alternatives, benefits and risks; including but not limited to bleeding, blood transfusion, infection, injury to the heart, recurrent sternal dehiscence and non-union.   Bryan Brock understands and agrees to proceed.  We will schedule surgery for  tomorrow afternoon. 

## 2023-01-06 NOTE — Progress Notes (Addendum)
[address]       [place] [address]             [phone]      4 Days Post-Op Procedure(s) (LRB): CORONARY ARTERY BYPASS GRAFTING (CABG) TIMES FOUR USING THE LEFT INTERNAL MAMMARY ARTERY (LIMA) AND BILATERAL GREATER SAPHENOUS VEIN ARTERIES (N/A) TRANSESOPHAGEAL ECHOCARDIOGRAM (N/A) Subjective: The patient states he has been in a lot of pain and states he feels he has cracked a rib or broken a wire.   Objective: Vital signs in last 24 hours: Temp:  [97.9 F (36.6 C)-98.5 F (36.9 C)] 97.9 F (36.6 C) (05/13 0340) Pulse Rate:  [80-105] 81 (05/13 0400) Cardiac Rhythm: Normal sinus rhythm;Bundle branch block (05/12 2003) Resp:  [20] 20 (05/13 0340) BP: (116-136)/(63-85) 116/65 (05/13 0400) SpO2:  [92 %-99 %] 95 % (05/13 0400) Weight:  [147 kg] 147 kg (05/13 0500)  Hemodynamic parameters for last 24 hours:    Intake/Output from previous day: 05/12 0701 - 05/13 0700 In: 1108.9 [P.O.:750; I.V.:358.9] Out: 650 [Urine:650] Intake/Output this shift: No intake/output data recorded.  General appearance: alert, cooperative, and no distress Neurologic: intact Heart: regular rate and rhythm, S1, S2 normal, no murmur, click, rub or gallop Lungs: Diminished bibasilar breath sounds Abdomen: Protuberant abdomen, +BS, no tenderness Extremities: edema 2+ Wound: Clean and dry, no erythema or sign of infection  Lab Results: Recent Labs    01/04/23 0413 01/05/23 0136  WBC 7.6 7.9  HGB 10.0* 10.4*  HCT 31.3* 31.8*  PLT 102* 121*   BMET:  Recent Labs    01/05/23 0136 01/06/23 0127  NA 130* 131*  K 3.6 4.1  CL 94* 99  CO2 26 28  GLUCOSE 150* 128*  BUN 17 15  CREATININE 1.26* 1.16  CALCIUM 8.2* 8.2*    PT/INR: No results for input(s): "LABPROT", "INR" in the last 72 hours. ABG    Component Value Date/Time   PHART 7.321 (L) 01/02/2023 1813   HCO3 23.4 01/02/2023 1813   TCO2 25 01/02/2023 1813   ACIDBASEDEF 3.0 (H) 01/02/2023 1813   O2SAT 94  01/02/2023 1813   CBG (last 3)  Recent Labs    01/05/23 0556 01/05/23 1229 01/05/23 2129  GLUCAP 152* 145* 112*    Assessment/Plan: S/P Procedure(s) (LRB): CORONARY ARTERY BYPASS GRAFTING (CABG) TIMES FOUR USING THE LEFT INTERNAL MAMMARY ARTERY (LIMA) AND BILATERAL GREATER SAPHENOUS VEIN ARTERIES (N/A) TRANSESOPHAGEAL ECHOCARDIOGRAM (N/A)  Neuro: Patient with worse pain, not able to deep breath. Receiving Tramadol, Oxycodone and Tylenol. Try lidocaine patches?   CV: Hx of afib with RVR. NSR this AM, HR 80s. Will transition to PO Amiodarone today. SBP 116. On Lopressor 25mg .   Pulm: Saturating 90s on RA this AM. CXR shows bilateral pleural effusions and atelectasis. Did not see broken wire, pending final read. Continue IS, ambulation and diuresis.   GI: -BM. Will give Lactulose  Endo: Ok control, CBGs 152/145/112  Renal: Cr improved at 1.16 this AM. UO 650cc/24hrs recorded, -5lbs from yesterday. +10lbs from preop weight. Continue Lasix and Metolazone.   Hyponatremia: Na 131 this AM, likely due to diuresis. Will continue to monitor.   Dispo: Transition to PO Amiodarone. Work on volume overload and bowel movement as well as pain control.    LOS: 4 days    Jenny Reichmann, PA-C 01/06/2023   Chart reviewed, patient examined, agree with above. He has been coughing hard this weekend without using a pillow to stabilize chest. I have been  in the room when he has done this and I have repeatedly warned him since risk of sternal dehiscence is higher with morbid obesity. CXR looks ok but there does appear to be some motion in his sternum with breathing and I have ordered a CT chest to rule out sternal dehiscence.

## 2023-01-06 NOTE — Progress Notes (Addendum)
CARDIAC REHAB PHASE I   PRE:  Rate/Rhythm: 80 SR    BP: sitting 135/83    SpO2: 97 RA  MODE:  Ambulation: 170 ft   POST:  Rate/Rhythm: 96 SR    BP: sitting 148/81     SpO2: 91 RA  Pt in bed, hasn't been up much. Gave instructions on how to get up however pt needed mod-max assist to roll to EOB following sternal precautions. He instinctively wants to move his hips with his arms. He stood well, no assist, however needed multiple reminders once up to stand tall, use his legs, and not lean forward on his arms. He has a bad right knee at baseline. Pt with fatigue and SOB with ambulation requiring multiple standing rest stops. SpO2 difficult to register, might be in high 80s with exertion. To recliner. I can hear his chest popping. Could benefit from flutter valve. Also would benefit from bariatric RW here in hospital.  Practiced IS (1250 ml). Encouraged recliner, eating, walking x3, pillow use. Gave Move in the Tube guidelines and d/c video to view. S.o. present and supportive. They have OHS booklet. Pt instructed not to get up on his own. Recommend PT c/s to help with mobility and d/c planning. Pt has 5 steps without a railing to get in to his house. Also asked mobility team to see him.  [phone]  Ethelda Chick BS, ACSM-CEP 01/06/2023 9:41 AM

## 2023-01-06 NOTE — Progress Notes (Addendum)
   01/05/23 2036  Provider Notification  Provider Name/Title Dr, Laneta Simmers  Date Provider Notified 01/05/23  Time Provider Notified 2036  Method of Notification Page;Call  Notification Reason Pt had complaints of severe pain on mid sternal. Pt had a concern of fracture rib due to strong coughing and he heard crackles or crepitus on his mid sternal] every time when he is trying to cough or taking deep breath. Requested by patient for antianxiety medicine and more stronger pain medicine. Pt has been  stable hemodynamically. NSR on the monitor. No acute distress.   Provider response See new orders for Xanax 0.25 mg BID PRN and continue to alternate oral pain med with PRN Tramadol] q 4 hrs, Tylenol 1,000 mg q 6 hrs and Oxycodone q 3 hrs.  Date of Provider Response 01/05/23  Time of Provider Response 2038   Filiberto Pinks, RN

## 2023-01-06 NOTE — Care Management Important Message (Signed)
Important Message  Patient Details  Name: MELESIO ETUE MRN: [id number] Date of Birth: 1956/03/20   Medicare Important Message Given:  Yes     Rahim, Caccese 01/06/2023, 11:12 AM

## 2023-01-07 ENCOUNTER — Encounter (HOSPITAL_COMMUNITY)
Admission: RE | Disposition: A | Discharge status: Still In Hospital | Payer: Self-pay | Source: Ambulatory Visit | Attending: Surgery

## 2023-01-07 ENCOUNTER — Inpatient Hospital Stay (HOSPITAL_COMMUNITY): Payer: Medicare Other | Admitting: General Practice

## 2023-01-07 ENCOUNTER — Other Ambulatory Visit: Payer: Self-pay

## 2023-01-07 ENCOUNTER — Inpatient Hospital Stay (HOSPITAL_COMMUNITY): Payer: Medicare Other

## 2023-01-07 ENCOUNTER — Encounter (HOSPITAL_COMMUNITY): Payer: Self-pay | Admitting: Surgery

## 2023-01-07 DIAGNOSIS — T8132XA Disruption of internal operation (surgical) wound, not elsewhere classified, initial encounter: Secondary | ICD-10-CM | POA: Diagnosis present

## 2023-01-07 DIAGNOSIS — I2510 Atherosclerotic heart disease of native coronary artery without angina pectoris: Secondary | ICD-10-CM | POA: Diagnosis not present

## 2023-01-07 DIAGNOSIS — T81328A Disruption or dehiscence of closure of other specified internal operation (surgical) wound, initial encounter: Secondary | ICD-10-CM | POA: Diagnosis present

## 2023-01-07 DIAGNOSIS — T84418A Breakdown (mechanical) of other internal orthopedic devices, implants and grafts, initial encounter: Secondary | ICD-10-CM | POA: Diagnosis not present

## 2023-01-07 DIAGNOSIS — J9601 Acute respiratory failure with hypoxia: Secondary | ICD-10-CM

## 2023-01-07 DIAGNOSIS — D649 Anemia, unspecified: Secondary | ICD-10-CM | POA: Diagnosis not present

## 2023-01-07 DIAGNOSIS — I10 Essential (primary) hypertension: Secondary | ICD-10-CM

## 2023-01-07 DIAGNOSIS — I9789 Other postprocedural complications and disorders of the circulatory system, not elsewhere classified: Secondary | ICD-10-CM | POA: Diagnosis not present

## 2023-01-07 HISTORY — PX: STERNAL INCISION RECLOSURE: SHX2442

## 2023-01-07 LAB — BASIC METABOLIC PANEL
Anion gap: 12 (ref 5–15)
Anion gap: 12 (ref 5–15)
BUN: 20 mg/dL (ref 8–23)
BUN: 33 mg/dL — ABNORMAL HIGH (ref 8–23)
CO2: 27 mmol/L (ref 22–32)
CO2: 23 mmol/L (ref 22–32)
Calcium: 8.6 mg/dL — ABNORMAL LOW (ref 8.9–10.3)
Calcium: 8.4 mg/dL — ABNORMAL LOW (ref 8.9–10.3)
Chloride: 94 mmol/L — ABNORMAL LOW (ref 98–111)
Chloride: 95 mmol/L — ABNORMAL LOW (ref 98–111)
Creatinine, Ser: 1.42 mg/dL — ABNORMAL HIGH (ref 0.61–1.24)
Creatinine, Ser: 1.67 mg/dL — ABNORMAL HIGH (ref 0.61–1.24)
GFR, Estimated: 54 mL/min — ABNORMAL LOW (ref >60)
GFR, Estimated: 45 mL/min — ABNORMAL LOW (ref >60)
Glucose, Bld: 142 mg/dL — ABNORMAL HIGH (ref 70–99)
Glucose, Bld: 201 mg/dL — ABNORMAL HIGH (ref 70–99)
Potassium: 4.2 mmol/L (ref 3.5–5.1)
Potassium: 4.7 mmol/L (ref 3.5–5.1)
Sodium: 133 mmol/L — ABNORMAL LOW (ref 135–145)
Sodium: 130 mmol/L — ABNORMAL LOW (ref 135–145)

## 2023-01-07 LAB — CBC
HCT: 30 % — ABNORMAL LOW (ref 39.0–52.0)
Hemoglobin: 9.5 g/dL — ABNORMAL LOW (ref 13.0–17.0)
MCH: 30.4 pg (ref 26.0–34.0)
MCHC: 31.7 g/dL (ref 30.0–36.0)
MCV: 96.2 fL (ref 80.0–100.0)
Platelets: 221 10*3/uL (ref 150–400)
RBC: 3.12 MIL/uL — ABNORMAL LOW (ref 4.22–5.81)
RDW: 13.6 % (ref 11.5–15.5)
WBC: 10.1 10*3/uL (ref 4.0–10.5)
nRBC: 0.3 % — ABNORMAL HIGH (ref 0.0–0.2)

## 2023-01-07 LAB — POCT I-STAT 7, (LYTES, BLD GAS, ICA,H+H)
Acid-base deficit: 2 mmol/L (ref 0.0–2.0)
Acid-base deficit: 1 mmol/L (ref 0.0–2.0)
Bicarbonate: 25.4 mmol/L (ref 20.0–28.0)
Bicarbonate: 25.1 mmol/L (ref 20.0–28.0)
Calcium, Ion: 1.18 mmol/L (ref 1.15–1.40)
Calcium, Ion: 1.14 mmol/L — ABNORMAL LOW (ref 1.15–1.40)
HCT: 29 % — ABNORMAL LOW (ref 39.0–52.0)
HCT: 29 % — ABNORMAL LOW (ref 39.0–52.0)
Hemoglobin: 9.9 g/dL — ABNORMAL LOW (ref 13.0–17.0)
Hemoglobin: 9.9 g/dL — ABNORMAL LOW (ref 13.0–17.0)
O2 Saturation: 98 %
O2 Saturation: 100 %
Patient temperature: 37.8
Patient temperature: 37.6
Potassium: 4.8 mmol/L (ref 3.5–5.1)
Potassium: 5.1 mmol/L (ref 3.5–5.1)
Sodium: 130 mmol/L — ABNORMAL LOW (ref 135–145)
Sodium: 129 mmol/L — ABNORMAL LOW (ref 135–145)
TCO2: 27 mmol/L (ref 22–32)
TCO2: 27 mmol/L (ref 22–32)
pCO2 arterial: 56.5 mmHg — ABNORMAL HIGH (ref 32–48)
pCO2 arterial: 46.7 mmHg (ref 32–48)
pH, Arterial: 7.265 — ABNORMAL LOW (ref 7.35–7.45)
pH, Arterial: 7.342 — ABNORMAL LOW (ref 7.35–7.45)
pO2, Arterial: 123 mmHg — ABNORMAL HIGH (ref 83–108)
pO2, Arterial: 201 mmHg — ABNORMAL HIGH (ref 83–108)

## 2023-01-07 LAB — GLUCOSE, CAPILLARY
Glucose-Capillary: 123 mg/dL — ABNORMAL HIGH (ref 70–99)
Glucose-Capillary: 105 mg/dL — ABNORMAL HIGH (ref 70–99)
Glucose-Capillary: 163 mg/dL — ABNORMAL HIGH (ref 70–99)
Glucose-Capillary: 172 mg/dL — ABNORMAL HIGH (ref 70–99)
Glucose-Capillary: 174 mg/dL — ABNORMAL HIGH (ref 70–99)

## 2023-01-07 LAB — MAGNESIUM: Magnesium: 2.3 mg/dL (ref 1.7–2.4)

## 2023-01-07 SURGERY — REWIRING, STERNUM
Anesthesia: General

## 2023-01-07 MED ORDER — FAMOTIDINE IN NACL 20-0.9 MG/50ML-% IV SOLN: 20.0000 mg | Freq: Two times a day (BID) | INTRAVENOUS | Status: DC

## 2023-01-07 MED ORDER — LACTATED RINGERS IV SOLN: INTRAVENOUS | Status: DC

## 2023-01-07 MED ORDER — AMIODARONE IV BOLUS ONLY 150 MG/100ML
150.0000 mg | Freq: Once | INTRAVENOUS | Status: AC
  Administered 2023-01-07: 150 mg via INTRAVENOUS
  Filled 2023-01-07: qty 100

## 2023-01-07 MED ORDER — PROPOFOL 10 MG/ML IV BOLUS
INTRAVENOUS | Status: DC | PRN
  Administered 2023-01-07: 50 mg via INTRAVENOUS

## 2023-01-07 MED ORDER — PROPOFOL 500 MG/50ML IV EMUL
INTRAVENOUS | Status: DC | PRN
  Administered 2023-01-07: 30 ug/kg/min via INTRAVENOUS

## 2023-01-07 MED ORDER — DEXMEDETOMIDINE HCL IN NACL 400 MCG/100ML IV SOLN
0.0000 ug/kg/h | INTRAVENOUS | Status: DC
  Administered 2023-01-07: 1.2 ug/kg/h via INTRAVENOUS
  Administered 2023-01-07 (×2): 0.7 ug/kg/h via INTRAVENOUS
  Administered 2023-01-08 (×3): 1.2 ug/kg/h via INTRAVENOUS
  Filled 2023-01-07: qty 100
  Filled 2023-01-07: qty 200
  Filled 2023-01-07: qty 100
  Filled 2023-01-07: qty 200

## 2023-01-07 MED ORDER — PANTOPRAZOLE SODIUM 40 MG PO TBEC: 40.0000 mg | DELAYED_RELEASE_TABLET | Freq: Every day | ORAL | Status: DC

## 2023-01-07 MED ORDER — SODIUM CHLORIDE 0.9% FLUSH
3.0000 mL | Freq: Two times a day (BID) | INTRAVENOUS | Status: DC
  Administered 2023-01-08 – 2023-01-13 (×11): 3 mL via INTRAVENOUS

## 2023-01-07 MED ORDER — AMISULPRIDE (ANTIEMETIC) 5 MG/2ML IV SOLN
10.0000 mg | Freq: Once | INTRAVENOUS | Status: DC | PRN
Start: 2023-01-07 — End: 2023-01-07

## 2023-01-07 MED ORDER — IPRATROPIUM-ALBUTEROL 0.5-2.5 (3) MG/3ML IN SOLN
RESPIRATORY_TRACT | Status: AC
  Filled 2023-01-07: qty 3

## 2023-01-07 MED ORDER — SODIUM CHLORIDE 0.45 % IV SOLN: INTRAVENOUS | Status: DC | PRN

## 2023-01-07 MED ORDER — FENTANYL 2500MCG IN NS 250ML (10MCG/ML) PREMIX INFUSION
25.0000 ug/h | INTRAVENOUS | Status: DC
  Administered 2023-01-07: 75 ug/h via INTRAVENOUS
  Filled 2023-01-07: qty 250

## 2023-01-07 MED ORDER — PHENYLEPHRINE HCL-NACL 20-0.9 MG/250ML-% IV SOLN: 0.0000 ug/min | INTRAVENOUS | Status: DC

## 2023-01-07 MED ORDER — MIDAZOLAM HCL 2 MG/2ML IJ SOLN
INTRAMUSCULAR | Status: DC | PRN
  Administered 2023-01-07: 2 mg via INTRAVENOUS

## 2023-01-07 MED ORDER — ORAL CARE MOUTH RINSE: 15.0000 mL | OROMUCOSAL | Status: DC | PRN

## 2023-01-07 MED ORDER — MORPHINE SULFATE (PF) 2 MG/ML IV SOLN: 1.0000 mg | INTRAVENOUS | Status: DC | PRN

## 2023-01-07 MED ORDER — SODIUM CHLORIDE 0.9 % IV SOLN: 250.0000 mL | INTRAVENOUS | Status: DC

## 2023-01-07 MED ORDER — ACETAMINOPHEN 500 MG PO TABS
1000.0000 mg | ORAL_TABLET | Freq: Four times a day (QID) | ORAL | Status: AC
  Administered 2023-01-08 – 2023-01-12 (×18): 1000 mg via ORAL
  Filled 2023-01-07 (×18): qty 2

## 2023-01-07 MED ORDER — VASOPRESSIN 20 UNIT/ML IV SOLN
INTRAVENOUS | Status: DC | PRN
  Administered 2023-01-07 (×3): 2 [IU] via INTRAVENOUS
  Administered 2023-01-07 (×2): 1 [IU] via INTRAVENOUS
  Administered 2023-01-07 (×2): 2 [IU] via INTRAVENOUS
  Administered 2023-01-07 (×2): 1 [IU] via INTRAVENOUS
  Administered 2023-01-07 (×3): 2 [IU] via INTRAVENOUS

## 2023-01-07 MED ORDER — NOREPINEPHRINE 4 MG/250ML-% IV SOLN
0.0000 ug/min | INTRAVENOUS | Status: DC
  Filled 2023-01-07: qty 250

## 2023-01-07 MED ORDER — METOCLOPRAMIDE HCL 5 MG/5ML PO SOLN
10.0000 mg | Freq: Three times a day (TID) | ORAL | Status: DC
  Filled 2023-01-07 (×3): qty 10

## 2023-01-07 MED ORDER — NOREPINEPHRINE 4 MG/250ML-% IV SOLN
INTRAVENOUS | Status: DC | PRN
  Administered 2023-01-07: 5 ug/min via INTRAVENOUS

## 2023-01-07 MED ORDER — PHENYLEPHRINE HCL-NACL 20-0.9 MG/250ML-% IV SOLN
INTRAVENOUS | Status: DC | PRN
  Administered 2023-01-07: 40 ug/min via INTRAVENOUS

## 2023-01-07 MED ORDER — CHLORHEXIDINE GLUCONATE 0.12 % MT SOLN
15.0000 mL | OROMUCOSAL | Status: AC
  Administered 2023-01-07: 15 mL via OROMUCOSAL

## 2023-01-07 MED ORDER — METOPROLOL TARTRATE 5 MG/5ML IV SOLN: 2.5000 mg | INTRAVENOUS | Status: DC | PRN

## 2023-01-07 MED ORDER — BISACODYL 5 MG PO TBEC
10.0000 mg | DELAYED_RELEASE_TABLET | Freq: Every day | ORAL | Status: DC
  Administered 2023-01-08 – 2023-01-13 (×6): 10 mg via ORAL
  Filled 2023-01-07 (×6): qty 2

## 2023-01-07 MED ORDER — MAGNESIUM SULFATE 4 GM/100ML IV SOLN: 4.0000 g | Freq: Once | INTRAVENOUS | Status: DC

## 2023-01-07 MED ORDER — POLYETHYLENE GLYCOL 3350 17 G PO PACK: 17.0000 g | PACK | Freq: Every day | ORAL | Status: DC

## 2023-01-07 MED ORDER — LACTATED RINGERS IV SOLN: 500.0000 mL | Freq: Once | INTRAVENOUS | Status: DC | PRN

## 2023-01-07 MED ORDER — INSULIN REGULAR(HUMAN) IN NACL 100-0.9 UT/100ML-% IV SOLN: INTRAVENOUS | Status: DC

## 2023-01-07 MED ORDER — ONDANSETRON HCL 4 MG/2ML IJ SOLN: 4.0000 mg | Freq: Four times a day (QID) | INTRAMUSCULAR | Status: DC | PRN

## 2023-01-07 MED ORDER — SODIUM CHLORIDE 0.9 % IV SOLN: INTRAVENOUS | Status: DC

## 2023-01-07 MED ORDER — FENTANYL CITRATE (PF) 250 MCG/5ML IJ SOLN
INTRAMUSCULAR | Status: AC
  Filled 2023-01-07: qty 5

## 2023-01-07 MED ORDER — DEXMEDETOMIDINE HCL IN NACL 400 MCG/100ML IV SOLN
INTRAVENOUS | Status: DC | PRN
  Administered 2023-01-07: .7 ug/kg/h via INTRAVENOUS

## 2023-01-07 MED ORDER — CEFAZOLIN SODIUM-DEXTROSE 2-4 GM/100ML-% IV SOLN
2.0000 g | Freq: Three times a day (TID) | INTRAVENOUS | Status: AC
  Administered 2023-01-07 – 2023-01-09 (×6): 2 g via INTRAVENOUS
  Filled 2023-01-07 (×6): qty 100

## 2023-01-07 MED ORDER — 0.9 % SODIUM CHLORIDE (POUR BTL) OPTIME
TOPICAL | Status: DC | PRN
  Administered 2023-01-07: 2000 mL

## 2023-01-07 MED ORDER — ROCURONIUM BROMIDE 10 MG/ML (PF) SYRINGE
PREFILLED_SYRINGE | INTRAVENOUS | Status: DC | PRN
  Administered 2023-01-07: 80 mg via INTRAVENOUS
  Administered 2023-01-07 (×2): 50 mg via INTRAVENOUS

## 2023-01-07 MED ORDER — LIDOCAINE 2% (20 MG/ML) 5 ML SYRINGE
INTRAMUSCULAR | Status: DC | PRN
  Administered 2023-01-07: 60 mg via INTRAVENOUS

## 2023-01-07 MED ORDER — ACETAMINOPHEN 10 MG/ML IV SOLN
1000.0000 mg | Freq: Once | INTRAVENOUS | Status: DC | PRN
Start: 2023-01-07 — End: 2023-01-07

## 2023-01-07 MED ORDER — ASPIRIN 325 MG PO TBEC
325.0000 mg | DELAYED_RELEASE_TABLET | Freq: Every day | ORAL | Status: DC
  Administered 2023-01-08 – 2023-02-04 (×27): 325 mg via ORAL
  Filled 2023-01-07 (×27): qty 1

## 2023-01-07 MED ORDER — ORAL CARE MOUTH RINSE
15.0000 mL | OROMUCOSAL | Status: DC
  Administered 2023-01-07 – 2023-01-08 (×9): 15 mL via OROMUCOSAL

## 2023-01-07 MED ORDER — MIDAZOLAM HCL 2 MG/2ML IJ SOLN: 2.0000 mg | INTRAMUSCULAR | Status: DC | PRN

## 2023-01-07 MED ORDER — IPRATROPIUM-ALBUTEROL 0.5-2.5 (3) MG/3ML IN SOLN
3.0000 mL | RESPIRATORY_TRACT | Status: DC | PRN
  Administered 2023-01-07 – 2023-01-18 (×8): 3 mL via RESPIRATORY_TRACT
  Filled 2023-01-07 (×8): qty 3

## 2023-01-07 MED ORDER — SUCCINYLCHOLINE CHLORIDE 200 MG/10ML IV SOSY
PREFILLED_SYRINGE | INTRAVENOUS | Status: DC | PRN
  Administered 2023-01-07: 140 mg via INTRAVENOUS

## 2023-01-07 MED ORDER — FENTANYL BOLUS VIA INFUSION
25.0000 ug | INTRAVENOUS | Status: DC | PRN
  Administered 2023-01-07 – 2023-01-08 (×5): 50 ug via INTRAVENOUS

## 2023-01-07 MED ORDER — CHLORHEXIDINE GLUCONATE CLOTH 2 % EX PADS
6.0000 | MEDICATED_PAD | Freq: Every day | CUTANEOUS | Status: DC
  Administered 2023-01-07 – 2023-01-13 (×7): 6 via TOPICAL

## 2023-01-07 MED ORDER — ACETAMINOPHEN 160 MG/5ML PO SOLN: 650.0000 mg | Freq: Once | ORAL | Status: DC

## 2023-01-07 MED ORDER — FENTANYL CITRATE PF 50 MCG/ML IJ SOSY
25.0000 ug | PREFILLED_SYRINGE | Freq: Once | INTRAMUSCULAR | Status: AC
  Administered 2023-01-07: 25 ug via INTRAVENOUS

## 2023-01-07 MED ORDER — VASOPRESSIN 20 UNIT/ML IV SOLN
INTRAVENOUS | Status: AC
  Filled 2023-01-07: qty 1

## 2023-01-07 MED ORDER — DEXTROSE 50 % IV SOLN: 0.0000 mL | INTRAVENOUS | Status: DC | PRN

## 2023-01-07 MED ORDER — DOCUSATE SODIUM 100 MG PO CAPS
200.0000 mg | ORAL_CAPSULE | Freq: Every day | ORAL | Status: DC
  Administered 2023-01-08 – 2023-01-13 (×6): 200 mg via ORAL
  Filled 2023-01-07 (×6): qty 2

## 2023-01-07 MED ORDER — ASPIRIN 81 MG PO CHEW: 324.0000 mg | CHEWABLE_TABLET | Freq: Every day | ORAL | Status: DC

## 2023-01-07 MED ORDER — DEXAMETHASONE SODIUM PHOSPHATE 10 MG/ML IJ SOLN
INTRAMUSCULAR | Status: DC | PRN
  Administered 2023-01-07: 10 mg via INTRAVENOUS

## 2023-01-07 MED ORDER — PROPOFOL 10 MG/ML IV BOLUS
INTRAVENOUS | Status: AC
  Filled 2023-01-07: qty 20

## 2023-01-07 MED ORDER — ONDANSETRON HCL 4 MG/2ML IJ SOLN
4.0000 mg | Freq: Once | INTRAMUSCULAR | Status: DC | PRN
Start: 2023-01-07 — End: 2023-01-07

## 2023-01-07 MED ORDER — POLYETHYLENE GLYCOL 3350 17 G PO PACK
17.0000 g | PACK | Freq: Two times a day (BID) | ORAL | Status: DC
  Administered 2023-01-07 – 2023-01-09 (×4): 17 g
  Filled 2023-01-07 (×4): qty 1

## 2023-01-07 MED ORDER — FAMOTIDINE 20 MG PO TABS
20.0000 mg | ORAL_TABLET | Freq: Two times a day (BID) | ORAL | Status: DC
  Administered 2023-01-07 – 2023-01-08 (×2): 20 mg
  Filled 2023-01-07: qty 1

## 2023-01-07 MED ORDER — TRAMADOL HCL 50 MG PO TABS
50.0000 mg | ORAL_TABLET | ORAL | Status: DC | PRN
  Administered 2023-01-08 – 2023-01-10 (×4): 100 mg via ORAL
  Administered 2023-01-10: 50 mg via ORAL
  Administered 2023-01-11: 100 mg via ORAL
  Administered 2023-01-12: 50 mg via ORAL
  Administered 2023-01-12 – 2023-02-04 (×10): 100 mg via ORAL
  Filled 2023-01-07 (×12): qty 2
  Filled 2023-01-07: qty 1
  Filled 2023-01-07 (×3): qty 2
  Filled 2023-01-07: qty 1

## 2023-01-07 MED ORDER — ALBUMIN HUMAN 5 % IV SOLN: 250.0000 mL | INTRAVENOUS | Status: DC | PRN

## 2023-01-07 MED ORDER — ORAL CARE MOUTH RINSE: 15.0000 mL | Freq: Once | OROMUCOSAL | Status: AC

## 2023-01-07 MED ORDER — ALBUTEROL SULFATE HFA 108 (90 BASE) MCG/ACT IN AERS
INHALATION_SPRAY | RESPIRATORY_TRACT | Status: DC | PRN
  Administered 2023-01-07: 4 via RESPIRATORY_TRACT

## 2023-01-07 MED ORDER — INSULIN ASPART 100 UNIT/ML IJ SOLN
2.0000 [IU] | INTRAMUSCULAR | Status: DC
  Administered 2023-01-07 (×2): 4 [IU] via SUBCUTANEOUS
  Administered 2023-01-08 (×2): 2 [IU] via SUBCUTANEOUS
  Administered 2023-01-08: 6 [IU] via SUBCUTANEOUS
  Administered 2023-01-08: 2 [IU] via SUBCUTANEOUS
  Administered 2023-01-08: 4 [IU] via SUBCUTANEOUS
  Administered 2023-01-09: 2 [IU] via SUBCUTANEOUS

## 2023-01-07 MED ORDER — POTASSIUM CHLORIDE 10 MEQ/50ML IV SOLN: 10.0000 meq | INTRAVENOUS | Status: DC

## 2023-01-07 MED ORDER — SODIUM CHLORIDE 0.9% FLUSH: 3.0000 mL | INTRAVENOUS | Status: DC | PRN

## 2023-01-07 MED ORDER — DEXMEDETOMIDINE HCL IN NACL 400 MCG/100ML IV SOLN: 0.0000 ug/kg/h | INTRAVENOUS | Status: DC

## 2023-01-07 MED ORDER — NITROGLYCERIN IN D5W 200-5 MCG/ML-% IV SOLN: 0.0000 ug/min | INTRAVENOUS | Status: DC

## 2023-01-07 MED ORDER — ACETAMINOPHEN 160 MG/5ML PO SOLN
1000.0000 mg | Freq: Four times a day (QID) | ORAL | Status: DC
  Administered 2023-01-07 – 2023-01-08 (×2): 1000 mg
  Filled 2023-01-07 (×2): qty 40.6

## 2023-01-07 MED ORDER — FENTANYL CITRATE (PF) 250 MCG/5ML IJ SOLN
INTRAMUSCULAR | Status: DC | PRN
  Administered 2023-01-07: 100 ug via INTRAVENOUS
  Administered 2023-01-07: 50 ug via INTRAVENOUS

## 2023-01-07 MED ORDER — ALBUMIN HUMAN 5 % IV SOLN: INTRAVENOUS | Status: DC | PRN

## 2023-01-07 MED ORDER — FENTANYL CITRATE (PF) 100 MCG/2ML IJ SOLN
25.0000 ug | INTRAMUSCULAR | Status: DC | PRN
Start: 2023-01-07 — End: 2023-01-07

## 2023-01-07 MED ORDER — ACETAMINOPHEN 650 MG RE SUPP: 650.0000 mg | Freq: Once | RECTAL | Status: DC

## 2023-01-07 MED ORDER — CALCIUM CHLORIDE 10 % IV SOLN
INTRAVENOUS | Status: DC | PRN
  Administered 2023-01-07: 200 mg via INTRAVENOUS
  Administered 2023-01-07: 500 mg via INTRAVENOUS
  Administered 2023-01-07: 100 mg via INTRAVENOUS

## 2023-01-07 MED ORDER — MIDAZOLAM HCL 2 MG/2ML IJ SOLN
INTRAMUSCULAR | Status: AC
  Filled 2023-01-07: qty 2

## 2023-01-07 MED ORDER — CHLORHEXIDINE GLUCONATE 0.12 % MT SOLN
15.0000 mL | Freq: Once | OROMUCOSAL | Status: AC
  Administered 2023-01-07: 15 mL via OROMUCOSAL
  Filled 2023-01-07: qty 15

## 2023-01-07 MED ORDER — PHENYLEPHRINE 80 MCG/ML (10ML) SYRINGE FOR IV PUSH (FOR BLOOD PRESSURE SUPPORT)
PREFILLED_SYRINGE | INTRAVENOUS | Status: DC | PRN
  Administered 2023-01-07: 80 ug via INTRAVENOUS
  Administered 2023-01-07 (×2): 120 ug via INTRAVENOUS

## 2023-01-07 MED ORDER — METOCLOPRAMIDE HCL 5 MG/ML IJ SOLN
10.0000 mg | Freq: Four times a day (QID) | INTRAMUSCULAR | Status: AC
  Administered 2023-01-07 – 2023-01-12 (×20): 10 mg via INTRAVENOUS
  Filled 2023-01-07 (×20): qty 2

## 2023-01-07 MED ORDER — BISACODYL 10 MG RE SUPP: 10.0000 mg | Freq: Every day | RECTAL | Status: DC

## 2023-01-07 SURGICAL SUPPLY — 102 items
ADH SKN CLS APL DERMABOND .7 (GAUZE/BANDAGES/DRESSINGS)
APL SKNCLS STERI-STRIP NONHPOA (GAUZE/BANDAGES/DRESSINGS)
ATTRACTOMAT 16X20 MAGNETIC DRP (DRAPES) ×1 IMPLANT
BAG DECANTER FOR FLEXI CONT (MISCELLANEOUS) ×1 IMPLANT
BAND INSRT 18 STRL LF DISP RB (MISCELLANEOUS)
BAND RUBBER #18 3X1/16 STRL (MISCELLANEOUS) IMPLANT
BATTERY PACK STR FOR DRIVER (MISCELLANEOUS) IMPLANT
BENZOIN TINCTURE PRP APPL 2/3 (GAUZE/BANDAGES/DRESSINGS) IMPLANT
BLADE CLIPPER SURG (BLADE) ×1 IMPLANT
BLADE SURG 10 STRL SS (BLADE) ×2 IMPLANT
BNDG GAUZE DERMACEA FLUFF 4 (GAUZE/BANDAGES/DRESSINGS) IMPLANT
BNDG GZE DERMACEA 4 6PLY (GAUZE/BANDAGES/DRESSINGS)
BRUSH SCRUB EZ PLAIN DRY (MISCELLANEOUS) IMPLANT
CANISTER SUCT 3000ML PPV (MISCELLANEOUS) ×1 IMPLANT
CATH THORACIC 28FR (CATHETERS) IMPLANT
CATH THORACIC 28FR RT ANG (CATHETERS) IMPLANT
CATH THORACIC 36FR (CATHETERS) IMPLANT
CATH THORACIC 36FR RT ANG (CATHETERS) IMPLANT
CLIP TI MEDIUM 24 (CLIP) IMPLANT
CLIP TI MEDIUM 6 (CLIP) ×1 IMPLANT
CLIP TI WIDE RED SMALL 24 (CLIP) IMPLANT
CNTNR URN SCR LID CUP LEK RST (MISCELLANEOUS) IMPLANT
CONN Y 3/8X3/8X3/8  BEN (MISCELLANEOUS) ×2
CONN Y 3/8X3/8X3/8 BEN (MISCELLANEOUS) IMPLANT
CONNECTOR STRAIGHT 3/8 (MISCELLANEOUS) IMPLANT
CONT SPEC 4OZ STRL OR WHT (MISCELLANEOUS)
COVER SURGICAL LIGHT HANDLE (MISCELLANEOUS) ×2 IMPLANT
DERMABOND ADVANCED .7 DNX12 (GAUZE/BANDAGES/DRESSINGS) IMPLANT
DRAPE CHEST BREAST 15X10 FENES (DRAPES) IMPLANT
DRAPE LAPAROSCOPIC ABDOMINAL (DRAPES) ×1 IMPLANT
DRAPE WARM FLUID 44X44 (DRAPES) ×1 IMPLANT
DRSG COVADERM 4X10 (GAUZE/BANDAGES/DRESSINGS) ×1 IMPLANT
DRSG COVADERM 4X14 (GAUZE/BANDAGES/DRESSINGS) IMPLANT
ELECT BLADE 4.0 EZ CLEAN MEGAD (MISCELLANEOUS) ×1
ELECT REM PT RETURN 9FT ADLT (ELECTROSURGICAL) ×1
ELECT SOLID GEL RDN PRO-PADZ (MISCELLANEOUS) ×1
ELECTRODE BLDE 4.0 EZ CLN MEGD (MISCELLANEOUS) IMPLANT
ELECTRODE REM PT RTRN 9FT ADLT (ELECTROSURGICAL) ×1 IMPLANT
ELECTRODE SOLI GEL RDN PROPADZ (MISCELLANEOUS) IMPLANT
FELT TEFLON 1X6 (MISCELLANEOUS) IMPLANT
GAUZE 4X4 16PLY ~~LOC~~+RFID DBL (SPONGE) ×1 IMPLANT
GAUZE SPONGE 4X4 12PLY STRL (GAUZE/BANDAGES/DRESSINGS) ×1 IMPLANT
GAUZE XEROFORM 5X9 LF (GAUZE/BANDAGES/DRESSINGS) IMPLANT
GLOVE BIO SURGEON STRL SZ 6 (GLOVE) IMPLANT
GLOVE BIO SURGEON STRL SZ 6.5 (GLOVE) IMPLANT
GLOVE BIOGEL PI IND STRL 6 (GLOVE) IMPLANT
GLOVE BIOGEL PI IND STRL 7.0 (GLOVE) IMPLANT
GLOVE BIOGEL PI IND STRL 7.5 (GLOVE) IMPLANT
GLOVE SURG MICRO LTX SZ7 (GLOVE) ×2 IMPLANT
GLOVE SURG SIGNA 7.5 PF LTX (GLOVE) ×2 IMPLANT
GLOVE SURG SS PI 6.5 STRL IVOR (GLOVE) IMPLANT
GOWN STRL REUS W/ TWL LRG LVL3 (GOWN DISPOSABLE) ×4 IMPLANT
GOWN STRL REUS W/ TWL XL LVL3 (GOWN DISPOSABLE) ×1 IMPLANT
GOWN STRL REUS W/TWL LRG LVL3 (GOWN DISPOSABLE) ×1
GOWN STRL REUS W/TWL XL LVL3 (GOWN DISPOSABLE) ×3
HANDPIECE INTERPULSE COAX TIP (DISPOSABLE)
HEMOSTAT POWDER SURGIFOAM 1G (HEMOSTASIS) ×2 IMPLANT
HEMOSTAT SURGICEL 2X14 (HEMOSTASIS) IMPLANT
KIT BASIN OR (CUSTOM PROCEDURE TRAY) ×1 IMPLANT
KIT SUCTION CATH 14FR (SUCTIONS) ×1 IMPLANT
KIT TURNOVER KIT B (KITS) ×1 IMPLANT
NS IRRIG 1000ML POUR BTL (IV SOLUTION) ×2 IMPLANT
PACK CHEST (CUSTOM PROCEDURE TRAY) ×1 IMPLANT
PAD ARMBOARD 7.5X6 YLW CONV (MISCELLANEOUS) ×2 IMPLANT
PIN SAFETY STERILE (MISCELLANEOUS) IMPLANT
SET HNDPC FAN SPRY TIP SCT (DISPOSABLE) IMPLANT
SOL PREP POV-IOD 4OZ 10% (MISCELLANEOUS) IMPLANT
SPIKE FLUID TRANSFER (MISCELLANEOUS) ×1 IMPLANT
SPONGE T-LAP 18X18 ~~LOC~~+RFID (SPONGE) ×5 IMPLANT
SPONGE T-LAP 4X18 ~~LOC~~+RFID (SPONGE) ×1 IMPLANT
SUT ETHIBOND 2 0 SH (SUTURE) ×1
SUT ETHIBOND 2 0 SH 36X2 (SUTURE) IMPLANT
SUT FIBERWIRE #2 38 T-5 BLUE (SUTURE)
SUT FIBERWIRE #5 38 CONV NDL (SUTURE)
SUT SILK  1 MH (SUTURE) ×3
SUT SILK 0 FSL (SUTURE) IMPLANT
SUT SILK 1 MH (SUTURE) IMPLANT
SUT STEEL 6MS V (SUTURE) IMPLANT
SUT STEEL STERNAL CCS#1 18IN (SUTURE) IMPLANT
SUT STEEL SZ 6 DBL 3X14 BALL (SUTURE) IMPLANT
SUT VIC AB 1 CTX 18 (SUTURE) ×1 IMPLANT
SUT VIC AB 1 CTX 36 (SUTURE) ×2
SUT VIC AB 1 CTX36XBRD ANBCTR (SUTURE) IMPLANT
SUT VIC AB 2-0 CT1 27 (SUTURE) ×1
SUT VIC AB 2-0 CT1 TAPERPNT 27 (SUTURE) IMPLANT
SUT VIC AB 2-0 CTX 36 (SUTURE) ×1 IMPLANT
SUT VIC AB 3-0 X1 27 (SUTURE) ×1 IMPLANT
SUT VICRYL 2 TP 1 (SUTURE) IMPLANT
SUTURE FIBERWR #2 38 T-5 BLUE (SUTURE) IMPLANT
SUTURE FIBERWR #5 38 CONV NDL (SUTURE) IMPLANT
SWAB COLLECTION DEVICE MRSA (MISCELLANEOUS) IMPLANT
SWAB CULTURE ESWAB REG 1ML (MISCELLANEOUS) IMPLANT
SYR 5ML LL (SYRINGE) IMPLANT
SYSTEM SAHARA CHEST DRAIN ATS (WOUND CARE) ×1 IMPLANT
TAPE CLOTH 4X10 WHT NS (GAUZE/BANDAGES/DRESSINGS) ×1 IMPLANT
TAPE CLOTH SURG 4X10 WHT LF (GAUZE/BANDAGES/DRESSINGS) IMPLANT
TOWEL GREEN STERILE (TOWEL DISPOSABLE) ×1 IMPLANT
TOWEL GREEN STERILE FF (TOWEL DISPOSABLE) ×1 IMPLANT
TRAY FOLEY MTR SLVR 14FR STAT (SET/KITS/TRAYS/PACK) IMPLANT
TRAY FOLEY SLVR 14FR TEMP STAT (SET/KITS/TRAYS/PACK) ×1 IMPLANT
TUNNELER SHEATH ON-Q 11GX8 DSP (PAIN MANAGEMENT) IMPLANT
WATER STERILE IRR 1000ML POUR (IV SOLUTION) ×2 IMPLANT

## 2023-01-07 NOTE — Progress Notes (Signed)
CARDIAC REHAB PHASE I    Pt to go to OR today for sternal wire repairs. Reviewed importance of sternal precautions and use of pillow with coughing and mobility. Pt is eager to learn proper techniques to prevent further issues with healing. Will continue to follow.   [id number]  Woodroe Chen, RN BSN 01/07/2023 9:12 AM

## 2023-01-07 NOTE — Anesthesia Procedure Notes (Signed)
Arterial Line Insertion Start/End5/14/2024 4:45 PM, 01/07/2023 4:55 PM Performed by: Ulla Potash, RN, CRNA  Patient location: Pre-op. Preanesthetic checklist: patient identified, IV checked, site marked, risks and benefits discussed, surgical consent, monitors and equipment checked, pre-op evaluation, timeout performed and anesthesia consent Lidocaine 1% used for infiltration Right, radial was placed Catheter size: 20 G Hand hygiene performed  and maximum sterile barriers used   Attempts: 2 Procedure performed without using ultrasound guided technique. Following insertion, dressing applied and Biopatch. Post procedure assessment: normal and unchanged  Additional procedure comments: Attempted by SRNA x 1, placed by Ulla Potash, CRNA.

## 2023-01-07 NOTE — Interval H&P Note (Signed)
History and Physical Interval Note:  01/07/2023 1:28 PM  Bryan Brock  has presented today for surgery, with the diagnosis of BROKEN STERNAL WIRES.  The various methods of treatment have been discussed with the patient and family. After consideration of risks, benefits and other options for treatment, the patient has consented to  Procedure(s): STERNAL REWIRING (N/A) possible STERNAL PLATING (N/A) as a surgical intervention.  The patient's history has been reviewed, patient examined, no change in status, stable for surgery.  I have reviewed the patient's chart and labs.  Questions were answered to the patient's satisfaction.     [name]

## 2023-01-07 NOTE — Progress Notes (Signed)
Pt has rested better last night. Pain is well controlled after alternating Oxycodone and Tramadol PRN for his sternal pain. He has SOB with short ambulation to bedside commode. O2 NCL 3 LPM given. He remains afebrile, stable hemodynamically, NSR with frequent PACs to Sinus arrhythmia on the monitor. HR is  80s-90s.  Mid sternal and right leg EVH incision is dry and clean, negative for drainage.   Filiberto Pinks, RN

## 2023-01-07 NOTE — Anesthesia Procedure Notes (Signed)
Procedure Name: Intubation Date/Time: 01/07/2023 2:08 PM  Performed by: Maxine Glenn, CRNAPre-anesthesia Checklist: Patient identified, Emergency Drugs available, Suction available and Patient being monitored Patient Re-evaluated:Patient Re-evaluated prior to induction Oxygen Delivery Method: Circle System Utilized Preoxygenation: Pre-oxygenation with 100% oxygen Induction Type: IV induction Ventilation: Mask ventilation without difficulty and Oral airway inserted - appropriate to patient size Laryngoscope Size: Mac and 4 Grade View: Grade I Tube type: Oral Tube size: 8.0 mm Number of attempts: 1 Airway Equipment and Method: Stylet and Oral airway Placement Confirmation: ETT inserted through vocal cords under direct vision, positive ETCO2 and breath sounds checked- equal and bilateral Secured at: 23 cm Tube secured with: Tape Dental Injury: Teeth and Oropharynx as per pre-operative assessment

## 2023-01-07 NOTE — Progress Notes (Signed)
PT Cancellation Note  Patient Details Name: Bryan Brock MRN: [id number] DOB: Feb 19, 1956   Cancelled Treatment:    Reason Eval/Treat Not Completed: Medical issues which prohibited therapy  Per chart, CT chest showed inferior two wires have pulled through sternum. Pt with plan for OR today. Will hold PT evaluation at this time. Will return as time allows and pt is appropriate.   Mikeria Valin B. Beverely Risen PT, DPT Acute Rehabilitation Services Please use secure chat or  Call Office [phone]  [name] Fleet 01/07/2023, 8:30 AM

## 2023-01-07 NOTE — Transfer of Care (Signed)
Immediate Anesthesia Transfer of Care Note  Patient: Bryan Brock  Procedure(s) Performed: STERNAL REWIRING  Patient Location: PACU  Anesthesia Type:General  Level of Consciousness: Patient remains intubated per anesthesia plan  Airway & Oxygen Therapy: Patient remains intubated per anesthesia plan and Patient placed on Ventilator (see vital sign flow sheet for setting)  Post-op Assessment: Report given to RN and Post -op Vital signs reviewed and stable  Post vital signs: Reviewed and stable  Last Vitals:  Vitals Value Taken Time  BP 150/65   Temp    Pulse 75   Resp 18   SpO2 97     Last Pain:  Vitals:   01/07/23 1143  TempSrc:   PainSc: 8       Patients Stated Pain Goal: 0 (01/04/23 2253)  Complications: No notable events documented.

## 2023-01-07 NOTE — Progress Notes (Signed)
OT Cancellation Note  Patient Details Name: PENROSE ROTTA MRN: [id number] DOB: 12-03-1955   Cancelled Treatment:    Reason Eval/Treat Not Completed: Patient not medically ready Per chart, CT chest showed inferior two wires have pulled through sternum. Pt with plan for OR today. Will hold OT evaluation at this time. Will return as time allows and pt is appropriate.   [name] Alert 01/07/2023, 8:03 AM

## 2023-01-07 NOTE — Anesthesia Preprocedure Evaluation (Addendum)
Anesthesia Evaluation  Patient identified by MRN, date of birth, ID band Patient awake    Reviewed: Allergy & Precautions, NPO status , Patient's Chart, lab work & pertinent test results  Airway Mallampati: III       Dental  (+) Chipped   Pulmonary shortness of breath, asthma , sleep apnea    Pulmonary exam normal        Cardiovascular hypertension, Pt. on medications + CAD, + CABG and + Peripheral Vascular Disease  Normal cardiovascular exam     Neuro/Psych  PSYCHIATRIC DISORDERS Anxiety Depression Bipolar Disorder   negative neurological ROS     GI/Hepatic Neg liver ROS, hiatal hernia,,,  Endo/Other    Morbid obesity  Renal/GU Renal disease     Musculoskeletal  (+) Arthritis ,    Abdominal  (+) + obese  Peds  (+) ADHD Hematology  (+) Blood dyscrasia, anemia   Anesthesia Other Findings  BROKEN STERNAL WIRES  Reproductive/Obstetrics                             Anesthesia Physical Anesthesia Plan  ASA: 4  Anesthesia Plan: General   Post-op Pain Management:    Induction: Intravenous  PONV Risk Score and Plan: 2 and Ondansetron, Dexamethasone and Treatment may vary due to age or medical condition  Airway Management Planned: Oral ETT  Additional Equipment:   Intra-op Plan:   Post-operative Plan: Possible Post-op intubation/ventilation  Informed Consent: I have reviewed the patients History and Physical, chart, labs and discussed the procedure including the risks, benefits and alternatives for the proposed anesthesia with the patient or authorized representative who has indicated his/her understanding and acceptance.     Dental advisory given  Plan Discussed with: CRNA  Anesthesia Plan Comments:        Anesthesia Quick Evaluation

## 2023-01-07 NOTE — Brief Op Note (Signed)
01/07/2023  5:21 PM  PATIENT:  Bryan Brock  67 y.o. male  PRE-OPERATIVE DIAGNOSIS:  Sternal Dehiscence  POST-OPERATIVE DIAGNOSIS:  same  PROCEDURE:  Procedure(s): STERNAL REWIRING (N/A)  SURGEON:  Surgeon(s) and Role:    * Karliah Kowalchuk, Payton Doughty, MD - Primary  PHYSICIAN ASSISTANT: none  ASSISTANTS: RNFA   ANESTHESIA:   general   BLOOD ADMINISTERED:none  DRAINS: bilateral 5F pleural tubes and one 44F tube in the anterior mediastinum    LOCAL MEDICATIONS USED:  NONE  SPECIMEN:  No Specimen  DISPOSITION OF SPECIMEN:  N/A  COUNTS:  YES  TOURNIQUET:  * No tourniquets in log *  DICTATION: .Note written in EPIC  PLAN OF CARE: Admit to inpatient   PATIENT DISPOSITION:  ICU - intubated and hemodynamically stable.   Delay start of Pharmacological VTE agent (>24hrs) due to surgical blood loss or risk of bleeding: no

## 2023-01-07 NOTE — Consult Note (Signed)
NAME:  Bryan Brock, MRN:  [id number], DOB:  Jan 12, 1956, LOS: 5 ADMISSION DATE:  01/02/2023, CONSULTATION DATE:  01/07/2023 REFERRING MD:  Dr. Laneta Simmers, CHIEF COMPLAINT:  SOB   History of Present Illness:   67 year old male with PMH as below significant for smokeless tobacco products, obesity, HTN, OSA, afib with RVR, mitral valve prolapse, HLD, GERD, ? Asthma, pre-diabetes who was admitted and underwent CABG x 3 on 5/9 with Dr. Laneta Simmers.  Patient with one year of progressive dyspnea, exertional fatigue, and lower extremity swelling found to significant three vessel disease CAD.  Patient found not to be using his pillow while coughing who developed worsening chest pain found to have sternal dehiscence on CT with two inferior wires pulled through sternum.  Went to OR 5/14 for sternal rewiring.  Noted some desaturations with induction, easy intubation and desaturates easily.  To rest overnight on mechanical ventilation, PCCM consulted to assist in vent and medical management while in ICU.   Pertinent  Medical History   Past Medical History:  Diagnosis Date   Acne    ADHD (attention deficit hyperactivity disorder)    Arthritis    Asthma    Bipolar 1 disorder (HCC)    Cancer (HCC)    basal cell cancer removed from back   Cataract    Coronary artery disease    Depression    Dyspnea    Dysrhythmia    PVCs   GERD (gastroesophageal reflux disease)    Heart murmur    Hepatitis    remote hx Hepatitis A (caused by food contaminant in childhood)   History of hiatal hernia    Hyperlipidemia    Hypertension    Mitral valve prolapse    Panic attacks    Peripheral vascular disease (HCC)    PFO (patent foramen ovale)    ? small PFO per echo   PONV (postoperative nausea and vomiting)    Pre-diabetes    Sleep apnea    Significant Hospital Events: Including procedures, antibiotic start and stop dates in addition to other pertinent events     Interim History / Subjective:   Objective   Blood  pressure (!) 122/49, pulse 91, temperature 98.4 F (36.9 C), temperature source Oral, resp. rate 20, height 5\' 11"  (1.803 m), weight (!) 147.8 kg, SpO2 93 %.    Vent Mode: PRVC FiO2 (%):  [100 %] 100 % Set Rate:  [18 bmp] 18 bmp Vt Set:  [600 mL] 600 mL PEEP:  [8 cmH20] 8 cmH20   Intake/Output Summary (Last 24 hours) at 01/07/2023 1747 Last data filed at 01/07/2023 1731 Gross per 24 hour  Intake 2150 ml  Output 1100 ml  Net 1050 ml   Filed Weights   01/07/23 0355 01/07/23 0405 01/07/23 1140  Weight: (!) 147.7 kg (!) 147.8 kg (!) 147.8 kg    Examination: General:  critically ill morbidly obese male sedated on MV in NAD HEENT: MM pink/moist, ETT/ OGT, pupils 3/r, anicteric Neuro: sedated CV: rr, NSR, no murmur, sternal incision cdi, CT in place, no airleak PULM:  MV supported, coarse, faint wheeze GI: protuberant, hypobs, soft/reducible umbilical hernia, foley- cyu Extremities: warm/dry, +1 LE edema, bilateral leg incisions clean Skin: no rashes   Resolved Hospital Problem list    Assessment & Plan:   Severe CAD s/p CABG x 4 on 01/02/23 Sternal dehiscence s/p sternal rewiring  Afib with RVR HTN HLD - per TCTS - pleural drains per TCTS - NE prn MAP goal >  65, likely related to sedation, repeat H/H stable - complete post-op antibiotics per TCTS - diurese as tolerated - tele monitoring - cont amio gtt  - monitor electrolytes, replete PRN - hold pta HTN meds while on pressors - ASA/ crestor resume tomorrow - multimodal pain management  - ?unclear last BM> aggressive bowel regimen> on reglan, consider KUB   Acute hypoxic respiratory failure  OSA Pleural effusions ?hx asthma  - does not appear to see pulmonologist, no prior PFTs P:  - to rest on MV overnight,  4-8cc/kg IBW with goal Pplat <30 and DP<15.  Will need use chest pillow with coughing/ pulm hygiene, avoid excessive coughing - prn duonebs  - pending CXR/ ABG - VAP prevention protocol/ PPI - PAD protocol  for sedation> cont precedex, wean off propofol, add low dose fentanyl gtt for pain control, w/ bowel regimen - wean FiO2/ peep as able for SpO2 >92% - daily SAT & SBT> hopeful in am  - diurese as BP/ renally tolerated    AKI Hyponatremia, hypervolemic  - felt secondary to diureses, s/p lasix/ metolazone given volume status.  Weight is up 5.4kg from admit - cont foley - supportive care  - trend renal indices, avoid nephrotoxins, renal dose meds   Expected post op ABLA, thrombocytopenia - trend CBC - transfuse per TCTS   Pre-diabetes - A1C 6.6 - SSI - goal CBG 140-180  Bipolar Anxiety - cont lamictal  Best Practice (right click and "Reselect all SmartList Selections" daily)   Diet/type: NPO DVT prophylaxis: SCD GI prophylaxis: H2B Lines: Central line Foley:  Yes, and it is still needed Code Status:  full code Last date of multidisciplinary goals of care discussion [per primary]  Labs   CBC: Recent Labs  Lab 01/02/23 1427 01/02/23 1428 01/02/23 1813 01/03/23 0341 01/03/23 1643 01/04/23 0413 01/05/23 0136  WBC 11.9*  --   --  6.9 8.8 7.6 7.9  HGB 12.1*   < > 11.9* 11.1* 11.1* 10.0* 10.4*  HCT 37.4*   < > 35.0* 32.7* 34.7* 31.3* 31.8*  MCV 96.4  --   --  93.7 97.5 95.4 94.4  PLT 154  --   --  136* 120* 102* 121*   < > = values in this interval not displayed.    Basic Metabolic Panel: Recent Labs  Lab 01/03/23 0341 01/03/23 1643 01/04/23 0413 01/05/23 0136 01/06/23 0127 01/07/23 0136  NA 133* 130* 128* 130* 131* 133*  K 4.1 4.0 3.4* 3.6 4.1 4.2  CL 102 96* 95* 94* 99 94*  CO2 24 24 25 26 28 27   GLUCOSE 131* 142* 139* 150* 128* 142*  BUN 19 21 16 17 15 20   CREATININE 1.33* 1.37* 1.19 1.26* 1.16 1.42*  CALCIUM 8.2* 8.2* 8.0* 8.2* 8.2* 8.6*  MG 2.1 2.3  --   --   --   --    GFR: Estimated Creatinine Clearance: 74.5 mL/min (A) (by C-G formula based on SCr of 1.42 mg/dL (H)). Recent Labs  Lab 01/03/23 0341 01/03/23 1643 01/04/23 0413  01/05/23 0136  WBC 6.9 8.8 7.6 7.9    Liver Function Tests: No results for input(s): "AST", "ALT", "ALKPHOS", "BILITOT", "PROT", "ALBUMIN" in the last 168 hours. No results for input(s): "LIPASE", "AMYLASE" in the last 168 hours. No results for input(s): "AMMONIA" in the last 168 hours.  ABG    Component Value Date/Time   PHART 7.321 (L) 01/02/2023 1813   PCO2ART 45.4 01/02/2023 1813   PO2ART 77 (L) 01/02/2023 1813  HCO3 23.4 01/02/2023 1813   TCO2 25 01/02/2023 1813   ACIDBASEDEF 3.0 (H) 01/02/2023 1813   O2SAT 94 01/02/2023 1813     Coagulation Profile: Recent Labs  Lab 01/02/23 1427  INR 1.3*    Cardiac Enzymes: No results for input(s): "CKTOTAL", "CKMB", "CKMBINDEX", "TROPONINI" in the last 168 hours.  HbA1C: Hgb A1c MFr Bld  Date/Time Value Ref Range Status  12/31/2022 11:42 AM 6.6 (H) 4.8 - 5.6 % Final    Comment:    (NOTE) Pre diabetes:          5.7%-6.4%  Diabetes:              >6.4%  Glycemic control for   <7.0% adults with diabetes   03/08/2022 01:30 PM 6.4 (H) 4.8 - 5.6 % Final    Comment:    (NOTE) Pre diabetes:          5.7%-6.4%  Diabetes:              >6.4%  Glycemic control for   <7.0% adults with diabetes     CBG: Recent Labs  Lab 01/06/23 1158 01/06/23 1810 01/06/23 2110 01/07/23 0602 01/07/23 1113  GLUCAP 120* 95 114* 123* 105*    Review of Systems:   Unable   Past Medical History:  He,  has a past medical history of Acne, ADHD (attention deficit hyperactivity disorder), Arthritis, Asthma, Bipolar 1 disorder (HCC), Cancer (HCC), Cataract, Coronary artery disease, Depression, Dyspnea, Dysrhythmia, GERD (gastroesophageal reflux disease), Heart murmur, Hepatitis, History of hiatal hernia, Hyperlipidemia, Hypertension, Mitral valve prolapse, Panic attacks, Peripheral vascular disease (HCC), PFO (patent foramen ovale), PONV (postoperative nausea and vomiting), Pre-diabetes, and Sleep apnea.   Surgical History:   Past Surgical  History:  Procedure Laterality Date   CORONARY ARTERY BYPASS GRAFT N/A 01/02/2023   Procedure: CORONARY ARTERY BYPASS GRAFTING (CABG) TIMES FOUR USING THE LEFT INTERNAL MAMMARY ARTERY (LIMA) AND BILATERAL GREATER SAPHENOUS VEIN ARTERIES;  Surgeon: [name], MD;  Location: [facility] OR;  Service: Open Heart Surgery;  Laterality: N/A;  Open median sternotomy   CORONARY PRESSURE/FFR STUDY N/A 11/21/2022   Procedure: INTRAVASCULAR PRESSURE WIRE/FFR STUDY;  Surgeon: Orbie Pyo, MD;  Location: [facility] INVASIVE CV LAB;  Service: Cardiovascular;  Laterality: N/A;   HYDROCELE EXCISION Bilateral 03/22/2022   Procedure: HYDROCELECTOMY ADULT;  Surgeon: Noel Christmas, MD;  Location: [facility] ORS;  Service: Urology;  Laterality: Bilateral;  2 HRS   LEFT HEART CATH AND CORONARY ANGIOGRAPHY N/A 11/21/2022   Procedure: LEFT HEART CATH AND CORONARY ANGIOGRAPHY;  Surgeon: Orbie Pyo, MD;  Location: [facility] INVASIVE CV LAB;  Service: Cardiovascular;  Laterality: N/A;   MOHS SURGERY  1990   STRABISMUS SURGERY Left    TEE WITHOUT CARDIOVERSION N/A 01/02/2023   Procedure: TRANSESOPHAGEAL ECHOCARDIOGRAM;  Surgeon: [name], MD;  [facility];  Service: Open Heart Surgery;  Laterality: N/A;   TONSILLECTOMY AND ADENOIDECTOMY     WISDOM TOOTH EXTRACTION       Social History:   reports that he has never smoked. His smokeless tobacco use includes snuff. He reports current alcohol use of about 1.0 - 2.0 standard drink of alcohol per week. He reports that he does not use drugs.   Family History:  His family history includes Cancer in his father; Diabetes in his mother; Heart disease in his father and mother; Hyperlipidemia in his father and mother; Obesity in his mother. There is no history of Colon cancer, Esophageal cancer, Rectal cancer, or  Stomach cancer.   Allergies Allergies  Allergen Reactions   Latex Dermatitis   Lisinopril Cough     Home Medications  Prior to Admission medications   Medication Sig  Start Date End Date Taking? Authorizing Provider  albuterol (VENTOLIN HFA) 108 (90 Base) MCG/ACT inhaler Inhale 1 puff into the lungs every 6 (six) hours as needed for wheezing or shortness of breath. 07/17/22  Yes [provider]  amLODipine (NORVASC) 5 MG tablet Take 1 tablet (5 mg total) by mouth daily. Patient taking differently: Take 5 mg by mouth at bedtime. 10/21/22  Yes Loyola Mast, MD  APPLE CIDER VINEGAR PO Take 15 mLs by mouth every Monday, Wednesday, and Friday.   Yes [provider]  Ascorbic Acid (VITAMIN C) 1000 MG tablet Take 3,000 mg by mouth daily.   Yes [provider]  aspirin EC 81 MG tablet Take 1 tablet (81 mg total) by mouth daily. Swallow whole. 11/11/22  Yes Swinyer, Zachary George, NP  calcium elemental as carbonate (TUMS ULTRA 1000) 400 MG chewable tablet Chew 2,000 mg by mouth 3 (three) times daily as needed for heartburn.   Yes [provider]  Cholecalciferol (DIALYVITE VITAMIN D 5000) 125 MCG (5000 UT) capsule Take 20,000 Units by mouth daily.   Yes [provider]  Coenzyme Q10 (COQ-10) 100 MG CAPS Take 100 mg by mouth daily.   Yes [provider]  Flaxseed, Linseed, (FLAX SEED OIL) 1000 MG CAPS Take 1,000 mg by mouth daily.   Yes [provider]  folic acid (FOLVITE) 800 MCG tablet Take 800 mcg by mouth daily.   Yes [provider]  hydrochlorothiazide (HYDRODIURIL) 25 MG tablet Take 1 tablet (25 mg total) by mouth daily. 07/29/22  Yes Meriam Sprague, MD  ibuprofen (ADVIL) 200 MG tablet Take 200 mg by mouth daily.   Yes [provider]  lamoTRIgine (LAMICTAL) 100 MG tablet Take 100 mg by mouth daily. 04/25/20  Yes [provider]  Liniments (BLUE-EMU SUPER STRENGTH EX) Apply 1 Application topically daily as needed (pain).   Yes [provider]  magnesium oxide (MAG-OX) 400 MG tablet Take 400 mg by mouth daily.   Yes [provider]  Multiple  Vitamins-Minerals (MULTIVITAMIN WITH MINERALS) tablet Take 1 tablet by mouth daily.   Yes [provider]  OVER THE COUNTER MEDICATION Nano CBD OIL: Pt takes 20 drops in the morning and 20 drops in the evening.   Yes [provider]  rosuvastatin (CRESTOR) 10 MG tablet Take 1 tablet (10 mg total) by mouth daily. 12/10/22  Yes Meriam Sprague, MD  sildenafil (VIAGRA) 100 MG tablet Take 100 mg by mouth daily as needed for erectile dysfunction.   Yes [provider]  zinc gluconate 50 MG tablet Take 50 mg by mouth daily.   Yes [provider]  diclofenac Sodium (VOLTAREN) 1 % GEL Apply 1 Application topically daily as needed (pain).    [provider]  Omega 3 1000 MG CAPS Take 1,000 mg by mouth daily.    [provider]  valsartan (DIOVAN) 320 MG tablet Take 1 tablet (320 mg total) by mouth daily. 10/28/22 10/28/23  Swinyer, Zachary George, NP     Critical care time: 40 mins     Posey Boyer, MSN, AG-ACNP-BC [facility] 01/07/2023, 6:38 PM  See Amion for pager If no response to pager, please call PCCM consult pager After 7:00 pm call Elink

## 2023-01-07 NOTE — Progress Notes (Signed)
      [address]       [place] [address]             [phone]      5 Days Post-Op Procedure(s) (LRB): CORONARY ARTERY BYPASS GRAFTING (CABG) TIMES FOUR USING THE LEFT INTERNAL MAMMARY ARTERY (LIMA) AND BILATERAL GREATER SAPHENOUS VEIN ARTERIES (N/A) TRANSESOPHAGEAL ECHOCARDIOGRAM (N/A) Subjective: Patient has no new complaints this AM.   Objective: Vital signs in last 24 hours: Temp:  [98 F (36.7 C)-98.7 F (37.1 C)] 98 F (36.7 C) (05/14 0359) Pulse Rate:  [84-100] 96 (05/14 0405) Cardiac Rhythm: Atrial fibrillation (05/14 0404) Resp:  [20] 20 (05/14 0405) BP: (91-134)/(39-75) 109/39 (05/14 0405) SpO2:  [84 %-100 %] 100 % (05/14 0405) Weight:  [147.7 kg-147.8 kg] 147.8 kg (05/14 0405)  Hemodynamic parameters for last 24 hours:    Intake/Output from previous day: 05/13 0701 - 05/14 0700 In: 250 [P.O.:250] Out: 600 [Urine:600] Intake/Output this shift: No intake/output data recorded.  General appearance: alert, cooperative, and no distress Neurologic: intact Heart: irregularly irregular rhythm and no murmur Lungs: Diminished bibasilar Abdomen: Protuberant abdomen, no tenderness, active bowel sounds Extremities: edema 2+ Wound: Clean and dry, no erythema or sign of infection. Ecchymosis surrounding LE incisions  Lab Results: Recent Labs    01/05/23 0136  WBC 7.9  HGB 10.4*  HCT 31.8*  PLT 121*   BMET:  Recent Labs    01/06/23 0127 01/07/23 0136  NA 131* 133*  K 4.1 4.2  CL 99 94*  CO2 28 27  GLUCOSE 128* 142*  BUN 15 20  CREATININE 1.16 1.42*  CALCIUM 8.2* 8.6*    PT/INR: No results for input(s): "LABPROT", "INR" in the last 72 hours. ABG    Component Value Date/Time   PHART 7.321 (L) 01/02/2023 1813   HCO3 23.4 01/02/2023 1813   TCO2 25 01/02/2023 1813   ACIDBASEDEF 3.0 (H) 01/02/2023 1813   O2SAT 94 01/02/2023 1813   CBG (last 3)  Recent Labs    01/06/23 1810 01/06/23 2110 01/07/23 0602  GLUCAP 95 114* 123*     Assessment/Plan: S/P Procedure(s) (LRB): CORONARY ARTERY BYPASS GRAFTING (CABG) TIMES FOUR USING THE LEFT INTERNAL MAMMARY ARTERY (LIMA) AND BILATERAL GREATER SAPHENOUS VEIN ARTERIES (N/A) TRANSESOPHAGEAL ECHOCARDIOGRAM (N/A)  Sternal dehiscence: CT chest showed inferior two wires have pulled through sternum. To OR today for sternal rewiring.    CV: Hx of afib with RVR. Afib this AM, HR 80s. SBP 109. On PO Amiodarone and Lopressor 25mg  BID. Will give bolus of Amiodarone. May need anticoagulation.    Pulm: Hx of OSA. Saturating well on RA yesterday, required 3L [place] overnight. Small to moderate left greater than right pleural effusions. Continued diuresis vs thoracentesis on left. Encourage IS and ambulation.    GI: -BM, some nausea with medications. Ok appetite. Multiple doses of lactulose have been given. Will start Reglan and continue Lactulose, may require enema.    Endo: Controlled, CBGs 95/114/123. On Levemir. Hx of prediabetes, preop Hgb A1C 6.6. Outpatient PCP follow up arranged.   Renal: Cr 1.42 this AM. Likely due to diuresis. Received Lasix and Metolazone yesterday. Weight +1lb from yesterday. UO 600cc/24hrs recorded. Continue Lasix?   Hyponatremia: Improving, Na 133 this AM. Will continue to monitor.    Dispo: To OR today for sternal rewiring.      LOS: 5 days    Jenny Reichmann, PA-C 01/07/2023

## 2023-01-07 NOTE — Progress Notes (Signed)
OT Cancellation Note  Patient Details Name: LENG QUISPE MRN: [id number] DOB: [birth year]   Cancelled Treatment:    Reason Eval/Treat Not Completed: Patient at procedure or test/ unavailable (Pt off floor, likely at surgery to repair CABG wires) OT will f/u with patient as able.  01/07/2023  AB, OTR/L  Acute Rehabilitation Services  Office: [phone]   Tristan Schroeder 01/07/2023, 11:51 AM

## 2023-01-07 NOTE — Anesthesia Procedure Notes (Signed)
Central Venous Catheter Insertion Performed by: Collene Schlichter, MD, anesthesiologist Start/End5/14/2024 4:46 PM, 01/07/2023 4:56 PM Patient location: OR. Preanesthetic checklist: patient identified, IV checked, site marked, risks and benefits discussed, surgical consent, monitors and equipment checked, pre-op evaluation, timeout performed and anesthesia consent Position: Trendelenburg Lidocaine 1% used for infiltration and patient sedated Hand hygiene performed , maximum sterile barriers used  and Seldinger technique used Catheter size: 8 Fr Total catheter length 16. Central line was placed.Double lumen Procedure performed using ultrasound guided technique. Ultrasound Notes:anatomy identified, needle tip was noted to be adjacent to the nerve/plexus identified, no ultrasound evidence of intravascular and/or intraneural injection and image(s) printed for medical record Attempts: 1 Following insertion, line sutured, dressing applied and Biopatch. Post procedure assessment: blood return through all ports, free fluid flow and no air  Patient tolerated the procedure well with no immediate complications.

## 2023-01-08 ENCOUNTER — Telehealth (HOSPITAL_COMMUNITY): Payer: Self-pay | Admitting: Pharmacy Technician

## 2023-01-08 ENCOUNTER — Inpatient Hospital Stay (HOSPITAL_COMMUNITY): Payer: Medicare Other

## 2023-01-08 ENCOUNTER — Other Ambulatory Visit (HOSPITAL_COMMUNITY): Payer: Self-pay

## 2023-01-08 ENCOUNTER — Encounter: Payer: Self-pay | Admitting: Cardiology

## 2023-01-08 ENCOUNTER — Encounter (HOSPITAL_COMMUNITY): Payer: Self-pay | Admitting: Surgery

## 2023-01-08 DIAGNOSIS — Z951 Presence of aortocoronary bypass graft: Secondary | ICD-10-CM

## 2023-01-08 LAB — CBC
HCT: 27.8 % — ABNORMAL LOW (ref 39.0–52.0)
HCT: 28.5 % — ABNORMAL LOW (ref 39.0–52.0)
Hemoglobin: 9.1 g/dL — ABNORMAL LOW (ref 13.0–17.0)
Hemoglobin: 9.3 g/dL — ABNORMAL LOW (ref 13.0–17.0)
MCH: 31.2 pg (ref 26.0–34.0)
MCH: 31 pg (ref 26.0–34.0)
MCHC: 32.7 g/dL (ref 30.0–36.0)
MCHC: 32.6 g/dL (ref 30.0–36.0)
MCV: 95.2 fL (ref 80.0–100.0)
MCV: 95 fL (ref 80.0–100.0)
Platelets: 201 10*3/uL (ref 150–400)
Platelets: 223 10*3/uL (ref 150–400)
RBC: 2.92 MIL/uL — ABNORMAL LOW (ref 4.22–5.81)
RBC: 3 MIL/uL — ABNORMAL LOW (ref 4.22–5.81)
RDW: 13.5 % (ref 11.5–15.5)
RDW: 13.6 % (ref 11.5–15.5)
WBC: 7.9 10*3/uL (ref 4.0–10.5)
WBC: 9.8 10*3/uL (ref 4.0–10.5)
nRBC: 0 % (ref 0.0–0.2)
nRBC: 0 % (ref 0.0–0.2)

## 2023-01-08 LAB — BASIC METABOLIC PANEL
Anion gap: 11 (ref 5–15)
Anion gap: 13 (ref 5–15)
BUN: 32 mg/dL — ABNORMAL HIGH (ref 8–23)
BUN: 31 mg/dL — ABNORMAL HIGH (ref 8–23)
CO2: 25 mmol/L (ref 22–32)
CO2: 25 mmol/L (ref 22–32)
Calcium: 8.2 mg/dL — ABNORMAL LOW (ref 8.9–10.3)
Calcium: 8.1 mg/dL — ABNORMAL LOW (ref 8.9–10.3)
Chloride: 94 mmol/L — ABNORMAL LOW (ref 98–111)
Chloride: 94 mmol/L — ABNORMAL LOW (ref 98–111)
Creatinine, Ser: 1.44 mg/dL — ABNORMAL HIGH (ref 0.61–1.24)
Creatinine, Ser: 1.32 mg/dL — ABNORMAL HIGH (ref 0.61–1.24)
GFR, Estimated: 53 mL/min — ABNORMAL LOW (ref >60)
GFR, Estimated: 59 mL/min — ABNORMAL LOW (ref >60)
Glucose, Bld: 186 mg/dL — ABNORMAL HIGH (ref 70–99)
Glucose, Bld: 165 mg/dL — ABNORMAL HIGH (ref 70–99)
Potassium: 4.6 mmol/L (ref 3.5–5.1)
Potassium: 3.6 mmol/L (ref 3.5–5.1)
Sodium: 130 mmol/L — ABNORMAL LOW (ref 135–145)
Sodium: 132 mmol/L — ABNORMAL LOW (ref 135–145)

## 2023-01-08 LAB — GLUCOSE, CAPILLARY
Glucose-Capillary: 189 mg/dL — ABNORMAL HIGH (ref 70–99)
Glucose-Capillary: 201 mg/dL — ABNORMAL HIGH (ref 70–99)
Glucose-Capillary: 170 mg/dL — ABNORMAL HIGH (ref 70–99)
Glucose-Capillary: 154 mg/dL — ABNORMAL HIGH (ref 70–99)
Glucose-Capillary: 149 mg/dL — ABNORMAL HIGH (ref 70–99)
Glucose-Capillary: 143 mg/dL — ABNORMAL HIGH (ref 70–99)

## 2023-01-08 LAB — POCT I-STAT 7, (LYTES, BLD GAS, ICA,H+H)
Acid-Base Excess: 0 mmol/L (ref 0.0–2.0)
Acid-Base Excess: 3 mmol/L — ABNORMAL HIGH (ref 0.0–2.0)
Acid-base deficit: 2 mmol/L (ref 0.0–2.0)
Bicarbonate: 25.1 mmol/L (ref 20.0–28.0)
Bicarbonate: 25.5 mmol/L (ref 20.0–28.0)
Bicarbonate: 28.2 mmol/L — ABNORMAL HIGH (ref 20.0–28.0)
Calcium, Ion: 1.12 mmol/L — ABNORMAL LOW (ref 1.15–1.40)
Calcium, Ion: 1.13 mmol/L — ABNORMAL LOW (ref 1.15–1.40)
Calcium, Ion: 1.2 mmol/L (ref 1.15–1.40)
HCT: 28 % — ABNORMAL LOW (ref 39.0–52.0)
HCT: 28 % — ABNORMAL LOW (ref 39.0–52.0)
HCT: 31 % — ABNORMAL LOW (ref 39.0–52.0)
Hemoglobin: 10.5 g/dL — ABNORMAL LOW (ref 13.0–17.0)
Hemoglobin: 9.5 g/dL — ABNORMAL LOW (ref 13.0–17.0)
Hemoglobin: 9.5 g/dL — ABNORMAL LOW (ref 13.0–17.0)
O2 Saturation: 96 %
O2 Saturation: 97 %
O2 Saturation: 99 %
Patient temperature: 37.4
Patient temperature: 37.6
Patient temperature: 37.9
Potassium: 4.6 mmol/L (ref 3.5–5.1)
Potassium: 4.7 mmol/L (ref 3.5–5.1)
Potassium: 4.9 mmol/L (ref 3.5–5.1)
Sodium: 130 mmol/L — ABNORMAL LOW (ref 135–145)
Sodium: 131 mmol/L — ABNORMAL LOW (ref 135–145)
Sodium: 132 mmol/L — ABNORMAL LOW (ref 135–145)
TCO2: 26 mmol/L (ref 22–32)
TCO2: 27 mmol/L (ref 22–32)
TCO2: 30 mmol/L (ref 22–32)
pCO2 arterial: 43.1 mmHg (ref 32–48)
pCO2 arterial: 45.9 mmHg (ref 32–48)
pCO2 arterial: 55.8 mmHg — ABNORMAL HIGH (ref 32–48)
pH, Arterial: 7.272 — ABNORMAL LOW (ref 7.35–7.45)
pH, Arterial: 7.375 (ref 7.35–7.45)
pH, Arterial: 7.399 (ref 7.35–7.45)
pO2, Arterial: 145 mmHg — ABNORMAL HIGH (ref 83–108)
pO2, Arterial: 83 mmHg (ref 83–108)
pO2, Arterial: 94 mmHg (ref 83–108)

## 2023-01-08 LAB — MAGNESIUM
Magnesium: 2.4 mg/dL (ref 1.7–2.4)
Magnesium: 2.4 mg/dL (ref 1.7–2.4)

## 2023-01-08 MED ORDER — AMIODARONE HCL IN DEXTROSE 360-4.14 MG/200ML-% IV SOLN
30.0000 mg/h | INTRAVENOUS | Status: AC
  Administered 2023-01-09 – 2023-01-11 (×6): 30 mg/h via INTRAVENOUS
  Filled 2023-01-08 (×10): qty 200

## 2023-01-08 MED ORDER — GABAPENTIN 100 MG PO CAPS
200.0000 mg | ORAL_CAPSULE | Freq: Two times a day (BID) | ORAL | Status: DC
  Administered 2023-01-08 – 2023-01-22 (×30): 200 mg via ORAL
  Filled 2023-01-08 (×30): qty 2

## 2023-01-08 MED ORDER — METOPROLOL TARTRATE 25 MG PO TABS
25.0000 mg | ORAL_TABLET | Freq: Two times a day (BID) | ORAL | Status: DC
  Administered 2023-01-08 – 2023-01-10 (×5): 25 mg via ORAL
  Filled 2023-01-08 (×5): qty 1

## 2023-01-08 MED ORDER — AMIODARONE IV BOLUS ONLY 150 MG/100ML
150.0000 mg | Freq: Once | INTRAVENOUS | Status: AC
  Administered 2023-01-08: 150 mg via INTRAVENOUS

## 2023-01-08 MED ORDER — AMIODARONE HCL IN DEXTROSE 360-4.14 MG/200ML-% IV SOLN
60.0000 mg/h | INTRAVENOUS | Status: AC
  Administered 2023-01-08 (×2): 60 mg/h via INTRAVENOUS
  Filled 2023-01-08 (×2): qty 200

## 2023-01-08 MED ORDER — FAMOTIDINE 20 MG PO TABS
20.0000 mg | ORAL_TABLET | Freq: Two times a day (BID) | ORAL | Status: DC
  Administered 2023-01-08 – 2023-01-09 (×2): 20 mg via ORAL
  Filled 2023-01-08 (×2): qty 1

## 2023-01-08 MED ORDER — HYDROCOD POLI-CHLORPHE POLI ER 10-8 MG/5ML PO SUER
5.0000 mL | Freq: Two times a day (BID) | ORAL | Status: DC
  Administered 2023-01-08 – 2023-02-04 (×54): 5 mL via ORAL
  Filled 2023-01-08 (×54): qty 5

## 2023-01-08 MED ORDER — FUROSEMIDE 10 MG/ML IJ SOLN
40.0000 mg | Freq: Once | INTRAMUSCULAR | Status: AC
  Administered 2023-01-08: 40 mg via INTRAVENOUS
  Filled 2023-01-08: qty 4

## 2023-01-08 MED ORDER — POTASSIUM CHLORIDE 10 MEQ/50ML IV SOLN
10.0000 meq | INTRAVENOUS | Status: AC
  Administered 2023-01-08 (×4): 10 meq via INTRAVENOUS
  Filled 2023-01-08 (×4): qty 50

## 2023-01-08 MED ORDER — LAMOTRIGINE 100 MG PO TABS
100.0000 mg | ORAL_TABLET | Freq: Every day | ORAL | Status: DC
  Administered 2023-01-09 – 2023-02-04 (×27): 100 mg via ORAL
  Filled 2023-01-08 (×28): qty 1

## 2023-01-08 NOTE — Anesthesia Postprocedure Evaluation (Addendum)
Anesthesia Post Note  Patient: Bryan Brock  Procedure(s) Performed: STERNAL REWIRING     Patient location during evaluation: SICU Anesthesia Type: General Level of consciousness: sedated Pain management: pain level controlled Vital Signs Assessment: post-procedure vital signs reviewed and stable Respiratory status: patient remains intubated per anesthesia plan Cardiovascular status: stable Postop Assessment: no apparent nausea or vomiting Anesthetic complications: no   No notable events documented.  Last Vitals:  Vitals:   01/08/23 1000 01/08/23 1100  BP: 96/71 103/62  Pulse: 81 82  Resp: 17 (!) 23  Temp: 37.2 C 37.1 C  SpO2: 95% 95%    Last Pain:  Vitals:   01/08/23 1130  TempSrc:   PainSc: Asleep                 Collene Schlichter

## 2023-01-08 NOTE — Progress Notes (Signed)
PT Cancellation Note  Patient Details Name: Bryan Brock MRN: [id number] DOB: Sep 24, 1955   Cancelled Treatment:    Reason Eval/Treat Not Completed: (P) Fatigue/lethargy limiting ability to participate Pt extubated this morning and dangled EOB with RN but reports he unable to do more today. PT will follow back for Evaluation tomorrow.  Kaylem Gidney B. Beverely Risen PT, DPT Acute Rehabilitation Services Please use secure chat or  Call Office [phone]    [name] [facility] 01/08/2023, 11:57 AM

## 2023-01-08 NOTE — TOC Benefit Eligibility Note (Signed)
Patient Product/process development scientist completed.    The patient is currently admitted and upon discharge could be taking Xarelto 20 mg.  The current 30 day co-pay is $535.18 due to a deductible.   The patient is currently admitted and upon discharge could be taking Eliquis 5 mg.  The current 30 day co-pay is $548.38 due to a deductible.   The patient is insured through [employer]   This test claim was processed through [facility]- copay amounts may vary at other pharmacies due to pharmacy/plan contracts, or as the patient moves through the different stages of their insurance plan.  Roland Earl, CPHT Pharmacy Patient Advocate Specialist [facility] Patient Advocate Team Direct Number: [phone]  Fax: [phone]

## 2023-01-08 NOTE — Progress Notes (Signed)
PT Cancellation Note  Patient Details Name: Bryan Brock MRN: [id number] DOB: 1955/11/02   Cancelled Treatment:    Reason Eval/Treat Not Completed: (P) Medical issues which prohibited therapy. Pt remains intubated after surgery yesterday with soft BP. RN requests hold of Evaluation. PT will follow back this afternoon to check on pt appropriateness as able.   Paco Cislo B. Beverely Risen PT, DPT Acute Rehabilitation Services Please use secure chat or  Call Office [phone]    [name] [facility] 01/08/2023, 8:39 AM

## 2023-01-08 NOTE — Progress Notes (Signed)
Patient ID: Bryan Brock, male   DOB: 11-28-1955, 67 y.o.   MRN: [id number] TCTS Evening Rounds:  Hemodynamically stable Rhythm is atrial fib with RVR. On IV amio and have given 150 mg bolus this afternoon and will repeat that now.  Extubated this am and doing well so far.  BMET    Component Value Date/Time   NA 132 (L) 01/08/2023 1601   NA 137 11/14/2022 1051   K 3.6 01/08/2023 1601   CL 94 (L) 01/08/2023 1601   CO2 25 01/08/2023 1601   GLUCOSE 165 (H) 01/08/2023 1601   BUN 31 (H) 01/08/2023 1601   BUN 32 (H) 11/14/2022 1051   CREATININE 1.32 (H) 01/08/2023 1601   CALCIUM 8.1 (L) 01/08/2023 1601   EGFR 50 (L) 11/14/2022 1051   GFRNONAA 59 (L) 01/08/2023 1601   Will give some K+ and Lasix this pm.

## 2023-01-08 NOTE — Progress Notes (Signed)
1 Day Post-Op Procedure(s) (LRB): STERNAL REWIRING (N/A) Subjective: Has been stable on vent overnight. Currently on 60% FiO2 and 8 PEEP.  Objective: Vital signs in last 24 hours: Temp:  [98.4 F (36.9 C)-100 F (37.8 C)] 99.7 F (37.6 C) (05/15 0803) Pulse Rate:  [62-98] 79 (05/15 0803) Cardiac Rhythm: Atrial fibrillation (05/15 0800) Resp:  [14-26] 22 (05/15 0803) BP: (88-131)/(49-80) 120/59 (05/15 0803) SpO2:  [80 %-100 %] 100 % (05/15 0803) Arterial Line BP: (58-141)/(34-73) 116/59 (05/15 0800) FiO2 (%):  [50 %-100 %] 50 % (05/15 0803) Weight:  [147.8 kg-149.1 kg] 149.1 kg (05/15 0500)  Hemodynamic parameters for last 24 hours:    Intake/Output from previous day: 05/14 0701 - 05/15 0700 In: 2931.3 [I.V.:2231.3; IV Piggyback:700] Out: 2070 [Urine:1520; Chest Tube:550] Intake/Output this shift: Total I/O In: 59.9 [I.V.:59.9] Out: 220 [Urine:150; Chest Tube:70]  General appearance: intubated on vent. Awakens and follows commands Neurologic: intact Heart: irregularly irregular rhythm Lungs: clear to auscultation bilaterally Abdomen: soft, non-tender; obese. Hypoactive BS. Extremities: edema moderate  Lab Results: Recent Labs    01/07/23 2127 01/08/23 0019 01/08/23 0356  WBC 10.1  --  7.9  HGB 9.5* 9.5* 9.1*  HCT 30.0* 28.0* 27.8*  PLT 221  --  201   BMET:  Recent Labs    01/07/23 2127 01/08/23 0019 01/08/23 0356  NA 130* 131* 130*  K 4.7 4.6 4.6  CL 95*  --  94*  CO2 23  --  25  GLUCOSE 201*  --  186*  BUN 33*  --  32*  CREATININE 1.67*  --  1.44*  CALCIUM 8.4*  --  8.2*    PT/INR: No results for input(s): "LABPROT", "INR" in the last 72 hours. ABG    Component Value Date/Time   PHART 7.375 01/08/2023 0019   HCO3 25.1 01/08/2023 0019   TCO2 26 01/08/2023 0019   ACIDBASEDEF 1.0 01/07/2023 1936   O2SAT 96 01/08/2023 0019   CBG (last 3)  Recent Labs    01/07/23 2013 01/07/23 2311 01/08/23 0323  GLUCAP 172* 174* 201*   CXR: LLL  atelectasis or air space disease. Improved from immediately postop   Assessment/Plan: S/P Procedure(s) (LRB): STERNAL REWIRING (N/A)  POD 6 CABG POD 1 Sternal rewiring  Hemodynamics stable on minimal NE.  Rhythm remains atrial fib with controlled rate 80's. Will switch amio to IV for now.  CCM following to help with extubation and management.   Volume excess: he is about 15 lbs over preop wt. Probably needs continued diuresis.   LOS: 6 days    Bryan Brock 01/08/2023

## 2023-01-08 NOTE — Progress Notes (Signed)
NAME:  Bryan Brock, MRN:  [id number], DOB:  1955-10-09, LOS: 6 ADMISSION DATE:  01/02/2023, CONSULTATION DATE:  01/07/2023 REFERRING MD:  Dr. Laneta Simmers, CHIEF COMPLAINT:  SOB   History of Present Illness:   67 year old male with PMH as below significant for smokeless tobacco products, obesity, HTN, OSA, afib with RVR, mitral valve prolapse, HLD, GERD, ? Asthma, pre-diabetes who was admitted and underwent CABG x 3 on 5/9 with Dr. Laneta Simmers.  Patient with one year of progressive dyspnea, exertional fatigue, and lower extremity swelling found to significant three vessel disease CAD.  Patient found not to be using his pillow while coughing who developed worsening chest pain found to have sternal dehiscence on CT with two inferior wires pulled through sternum.  Went to OR 5/14 for sternal rewiring.  Noted some desaturations with induction, easy intubation and desaturates easily.  To rest overnight on mechanical ventilation, PCCM consulted to assist in vent and medical management while in ICU.   Pertinent  Medical History   Past Medical History:  Diagnosis Date   Acne    ADHD (attention deficit hyperactivity disorder)    Arthritis    Asthma    Bipolar 1 disorder (HCC)    Cancer (HCC)    basal cell cancer removed from back   Cataract    Coronary artery disease    Depression    Dyspnea    Dysrhythmia    PVCs   GERD (gastroesophageal reflux disease)    Heart murmur    Hepatitis    remote hx Hepatitis A (caused by food contaminant in childhood)   History of hiatal hernia    Hyperlipidemia    Hypertension    Mitral valve prolapse    Panic attacks    Peripheral vascular disease (HCC)    PFO (patent foramen ovale)    ? small PFO per echo   PONV (postoperative nausea and vomiting)    Pre-diabetes    Sleep apnea    Significant Hospital Events: Including procedures, antibiotic start and stop dates in addition to other pertinent events   To OR 5/14 for sternal re-wiring   Interim History /  Subjective:  No distress  Objective   Blood pressure (Abnormal) 120/59, pulse 79, temperature 99.7 F (37.6 C), resp. rate (Abnormal) 22, height 5\' 11"  (1.803 m), weight (Abnormal) 149.1 kg, SpO2 100 %.    Vent Mode: PRVC FiO2 (%):  [50 %-100 %] 50 % Set Rate:  [18 bmp-22 bmp] 22 bmp Vt Set:  [600 mL] 600 mL PEEP:  [8 cmH20] 8 cmH20 Plateau Pressure:  [21 cmH20-29 cmH20] 21 cmH20   Intake/Output Summary (Last 24 hours) at 01/08/2023 0809 Last data filed at 01/08/2023 0800 Gross per 24 hour  Intake 2991.19 ml  Output 2290 ml  Net 701.19 ml   Filed Weights   01/07/23 0405 01/07/23 1140 01/08/23 0500  Weight: (Abnormal) 147.8 kg (Abnormal) 147.8 kg (Abnormal) 149.1 kg    Examination: General this is a 67 year old male. Currently on full vent support HENT orally intubated. No JVD Pulm, cl. Dec bases. VTs 800s on PEEP 5/PS 5Pcxr: ett, line good position. Sternal wires noted. L>R airspace disease.  Card rrr, dressing intact. Mediastinal tubes good position he occasionally vagals down w/ coughing on vent  Abd soft Ext warm  Neuro intact  Gu gl yellow  Resolved Hospital Problem list    Assessment & Plan:   Severe CAD s/p CABG x 4 on 01/02/23 Sternal dehiscence s/p sternal  rewiring  Afib with RVR HTN HLD Plan pleural drains per TCTS Resume asa and crestor today  NE prn MAP goal > 65, likely related to sedation, repeat H/H stable complete post-op antibiotics per TCTS tele monitoring amio gtt  hold pta HTN meds while on pressors multimodal pain management  aggressive bowel regimen> on reglan, consider KUB Re-assess post extubation for diuresis   Acute hypoxic respiratory failure  OSA Pleural effusions ?hx asthma  - does not appear to see pulmonologist, no prior PFTs Plan SBT w/ plan to extubate today Pillow splint w/ coughing  IS Mobilize Pulse ox    AKI Hyponatremia, hypervolemic  - felt secondary to diureses, s/p lasix/ metolazone given volume status.  Weight  is up 5.4kg from admit. Na stable. Scr better w/ holding lasix + 900 fluid balance  plan Keep foley  Ensure euvolemia  Re-assess for diuresis daily    Expected post op ABLA, thrombocytopenia -hgb stable over night Plan Monitor   Pre-diabetes w/ hyperglycemia  - A1C 6.6;  Plan Adjust ssi   Bipolar Anxiety Plan lamictal  Best Practice (right click and "Reselect all SmartList Selections" daily)   Diet/type: NPO DVT prophylaxis: SCD GI prophylaxis: H2B Lines: Central line Foley:  Yes, and it is still needed Code Status:  full code Last date of multidisciplinary goals of care discussion [per primary]

## 2023-01-08 NOTE — Op Note (Signed)
CARDIOVASCULAR SURGERY OPERATIVE NOTE  01/08/2023  Surgeon:  [name], MD  First Assistant: RNFA   Preoperative Diagnosis: Sternal dehiscence status post coronary bypass graft surgery  Postoperative Diagnosis:  Same   Procedure:  Sternal rewiring   Anesthesia:  General Endotracheal   Clinical History/Surgical Indication:  The patient is a 67 year old gentleman with morbid obesity who is 5 days status post coronary bypass graft surgery.  He has been coughing very hard without cushioning his chest and developed severe pain in the lower sternum and left chest with some sternal instability.  CT scan of the chest showed that the lower 2 sternal wires had pulled through the sternum with partial disruption of the sternal closure.  I felt the best option would be to take the patient to the operating room for sternal rewiring to avoid further disruption of the sternum and the risk of infection.  The operative procedure was discussed with the patient including alternatives, benefits, and risk including bleeding, blood transfusion, infection, sternal nonunion or further dehiscence possibly requiring further surgery for reconstruction.  He understood and agreed to proceed.  Preparation:  The patient was seen in the preoperative holding area and the correct patient, correct operation were confirmed with the patient after reviewing the medical record and catheterization. The consent was signed by me. Preoperative antibiotics were given. The patient was taken back to the operating room and positioned supine on the operating room table. After being placed under general endotracheal anesthesia by the anesthesia team a foley catheter was placed. The neck, chest, and abdomen were prepped with betadine soap and solution and draped in the usual sterile manner. A surgical time-out was taken and the correct  patient and operative procedure were confirmed with the nursing and anesthesia staff.   Rewiring of sternum:  The incision was open and the suture material removed.  There was obvious disruption of the lower end of the sternum with mid lower to sternal wires having pulled through the left side of the sternum.  All the sternal wires were removed.  The left lower part of the sternum where the wires had pulled through was broken into multiple pieces.  In addition he had disrupted 2 of the junctions of the left costal cartilages to the associated rib anteriorly within the left chest.  There is no sign of infection.  I thought the best option to try to achieve closure was to perform a Robicsek weave around the left lower sternal segments to bring things back together again.  2 #6 sternal wires were woven around the fractured segments of the left sternum in opposite directions.  When these wires were tightened it brought the fractured ribs and costal cartilages back in apposition.  Then a #36 French chest tube was placed in the anterior mediastinum.  A 28 French chest tube was placed in each pleural space to evacuate the pleural effusions.  The sternum was then reapproximated with double #6 stainless steel wires.  This took a total of 8 wires.  The lower wires were placed lateral to the sternal weave.  This was successful in bringing the sternum completely back together.  Then the midline fascia below the sternum was reapproximated with interrupted 0 Vicryl sutures.  The subcutaneous tissue was closed with continuous 2-0 Vicryl suture and the skin with continuous 3-0 Vicryl subcuticular closure.  This resulted in a nice tension-free closure.   All sponge, needle, and instrument counts were reported correct at the end of the case. Dry sterile  dressings were placed over the incisions and around the chest tubes which were connected to pleurevac suction. The patient was then transported to the surgical intensive care  unit in stable condition.

## 2023-01-08 NOTE — Telephone Encounter (Signed)
Pharmacy Patient Advocate Encounter  Insurance verification completed.    The patient is insured through [employer] Medicare Part D   The patient is currently admitted and ran test claims for the following: Eliquis, Xarelto.  Copays and coinsurance results were relayed to Inpatient clinical team.  

## 2023-01-08 NOTE — Addendum Note (Signed)
Addendum  created 01/08/23 1223 by Collene Schlichter, MD   Clinical Note Signed

## 2023-01-08 NOTE — Progress Notes (Addendum)
OT Cancellation Note  Patient Details Name: Bryan Brock MRN: [id number] DOB: 02-20-1956   Cancelled Treatment:    Reason Eval/Treat Not Completed: Medical issues which prohibited therapy (pt with soft BP and per RN possible extubation later today, will follow up for OT evaluation as appropriate.)  1159: Pt extubated this AM, was able to dangle, per RN states he is unable to do more today. Will follow up next date for OT evaluation as appropriate/schedule permitting.  Carver Fila, OTD, OTR/L [facility] [phone]   Dalphine Handing 01/08/2023, 8:25 AM

## 2023-01-08 NOTE — Procedures (Signed)
Extubation Procedure Note  Patient Details:   Name: Bryan Brock DOB: [birth year] MRN: [id number]   Airway Documentation:    Vent end date: 01/08/23 Vent end time: 0905   Evaluation  O2 sats: stable throughout Complications: No apparent complications Patient did tolerate procedure well. Bilateral Breath Sounds: Diminished, Clear   Yes  Positive cuff leak present prior to extubation. Pt extubated to 3L [place].  Lajean Manes 01/08/2023, 9:07 AM

## 2023-01-09 ENCOUNTER — Inpatient Hospital Stay (HOSPITAL_COMMUNITY): Payer: Medicare Other

## 2023-01-09 DIAGNOSIS — J9601 Acute respiratory failure with hypoxia: Secondary | ICD-10-CM | POA: Diagnosis not present

## 2023-01-09 LAB — BASIC METABOLIC PANEL
Anion gap: 11 (ref 5–15)
BUN: 35 mg/dL — ABNORMAL HIGH (ref 8–23)
CO2: 27 mmol/L (ref 22–32)
Calcium: 7.8 mg/dL — ABNORMAL LOW (ref 8.9–10.3)
Chloride: 92 mmol/L — ABNORMAL LOW (ref 98–111)
Creatinine, Ser: 1.45 mg/dL — ABNORMAL HIGH (ref 0.61–1.24)
GFR, Estimated: 53 mL/min — ABNORMAL LOW (ref >60)
Glucose, Bld: 112 mg/dL — ABNORMAL HIGH (ref 70–99)
Potassium: 3.8 mmol/L (ref 3.5–5.1)
Sodium: 130 mmol/L — ABNORMAL LOW (ref 135–145)

## 2023-01-09 LAB — GLUCOSE, CAPILLARY
Glucose-Capillary: 110 mg/dL — ABNORMAL HIGH (ref 70–99)
Glucose-Capillary: 113 mg/dL — ABNORMAL HIGH (ref 70–99)
Glucose-Capillary: 116 mg/dL — ABNORMAL HIGH (ref 70–99)
Glucose-Capillary: 124 mg/dL — ABNORMAL HIGH (ref 70–99)
Glucose-Capillary: 125 mg/dL — ABNORMAL HIGH (ref 70–99)
Glucose-Capillary: 125 mg/dL — ABNORMAL HIGH (ref 70–99)

## 2023-01-09 LAB — CBC
HCT: 29.1 % — ABNORMAL LOW (ref 39.0–52.0)
Hemoglobin: 9.2 g/dL — ABNORMAL LOW (ref 13.0–17.0)
MCH: 30.8 pg (ref 26.0–34.0)
MCHC: 31.6 g/dL (ref 30.0–36.0)
MCV: 97.3 fL (ref 80.0–100.0)
Platelets: 248 10*3/uL (ref 150–400)
RBC: 2.99 MIL/uL — ABNORMAL LOW (ref 4.22–5.81)
RDW: 13.8 % (ref 11.5–15.5)
WBC: 9 10*3/uL (ref 4.0–10.5)
nRBC: 0.2 % (ref 0.0–0.2)

## 2023-01-09 MED ORDER — MUSCLE RUB 10-15 % EX CREA
TOPICAL_CREAM | CUTANEOUS | Status: DC | PRN
  Filled 2023-01-09 (×2): qty 85

## 2023-01-09 MED ORDER — POTASSIUM CHLORIDE CRYS ER 20 MEQ PO TBCR
20.0000 meq | EXTENDED_RELEASE_TABLET | ORAL | Status: AC
  Administered 2023-01-09 (×3): 20 meq via ORAL
  Filled 2023-01-09 (×3): qty 1

## 2023-01-09 MED ORDER — INSULIN ASPART 100 UNIT/ML IJ SOLN
2.0000 [IU] | Freq: Three times a day (TID) | INTRAMUSCULAR | Status: DC
  Administered 2023-01-09 (×2): 2 [IU] via SUBCUTANEOUS
  Administered 2023-01-10: 4 [IU] via SUBCUTANEOUS
  Administered 2023-01-10: 2 [IU] via SUBCUTANEOUS
  Administered 2023-01-11 – 2023-01-12 (×2): 4 [IU] via SUBCUTANEOUS
  Administered 2023-01-13 (×2): 2 [IU] via SUBCUTANEOUS

## 2023-01-09 NOTE — Progress Notes (Signed)
2 Days Post-Op Procedure(s) (LRB): STERNAL REWIRING (N/A) Subjective:  He had a stable night but had some atrial fib with RVR and received amio bolus.   Up in chair this am with 4 person assist but stood ok.  Said he coughed last night and felt a pop in lower chest and some weird noises but chest feels ok this am.  Objective: Vital signs in last 24 hours: Temp:  [97.9 F (36.6 C)-99.7 F (37.6 C)] 98 F (36.7 C) (05/16 0300) Pulse Rate:  [68-133] 72 (05/16 0530) Cardiac Rhythm: Normal sinus rhythm (05/16 0400) Resp:  [15-31] 17 (05/16 0530) BP: (81-120)/(59-71) 103/62 (05/15 1100) SpO2:  [68 %-100 %] 92 % (05/16 0530) Arterial Line BP: (57-153)/(44-78) 122/55 (05/16 0530) FiO2 (%):  [50 %-60 %] 50 % (05/15 0803) Weight:  [144 kg] 144 kg (05/16 0500)  Hemodynamic parameters for last 24 hours:    Intake/Output from previous day: 05/15 0701 - 05/16 0700 In: 1049 [I.V.:699.2; IV Piggyback:349.8] Out: 3070 [Urine:2190; Chest Tube:880] Intake/Output this shift: No intake/output data recorded.  General appearance: alert and cooperative Neurologic: intact Heart: regular rate and rhythm, S1, S2 normal, no murmur Lungs: clear to auscultation bilaterally Abdomen: obese, soft, non-tender; bowel sounds normal Extremities: mild edema Wound: chest dressing dry Chest tube output serosanguinous.  Lab Results: Recent Labs    01/08/23 1601 01/09/23 0335  WBC 9.8 9.0  HGB 9.3* 9.2*  HCT 28.5* 29.1*  PLT 223 248   BMET:  Recent Labs    01/08/23 1601 01/09/23 0335  NA 132* 130*  K 3.6 3.8  CL 94* 92*  CO2 25 27  GLUCOSE 165* 112*  BUN 31* 35*  CREATININE 1.32* 1.45*  CALCIUM 8.1* 7.8*    PT/INR: No results for input(s): "LABPROT", "INR" in the last 72 hours. ABG    Component Value Date/Time   PHART 7.399 01/08/2023 0830   HCO3 28.2 (H) 01/08/2023 0830   TCO2 30 01/08/2023 0830   ACIDBASEDEF 1.0 01/07/2023 1936   O2SAT 97 01/08/2023 0830   CBG (last 3)  Recent  Labs    01/08/23 2018 01/09/23 0044 01/09/23 0341  GLUCAP 143* 125* 116*    Assessment/Plan:  POD 7 CABG POD 2 Sternal rewiring   Stable hemodynamics on Lopressor 25 bid.  Postop atrial fib with RVR. Continue IV amio today.  Volume excess: -2L yesterday. Wt is down 11 lbs from yesterday which I doubt. Current weight is only 3.5 lbs over preop. Will hold off on diuresis today since BUN and creat bumped a little.  Keep chest tubes in.  DC arterial line.  Will keep foley in today since he is not very mobile yet.  IS, OOB.  He is using pillow to stabilize chest when he coughs.  LOS: 7 days    [name] 01/09/2023

## 2023-01-09 NOTE — Evaluation (Signed)
Physical Therapy Evaluation Patient Details Name: Bryan Brock MRN: [id number] DOB: Jun 19, 1956 Today's Date: 01/09/2023  History of Present Illness  67 yo male admitted 5/9 for CABG x 4. 5/14 sternal rewiring due to dehiscence coughing. PMhx: HTN, HLD, bipolar d/o, obesity, sleep apnea, mitral valve prolapse, GERD  Clinical Impression  Pt pleasant and reports increased pain since rewiring. 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 Pt aware of precautions with excellent use of heart pillow with coughing and moving. Pt educated for all restrictions and technique with transfers and gait limited by fatigue and pain. Pt with decreased strength, transfers and mobility who will benefit from acute therapy to maximize mobility and safety. Pt also with Rt piriformis tightness and pain with manual pressure/therapy applied and tennis ball to roll on recommended if family can bring, pt unable to figure 4 for stretching.     HR 80-90 SPo2 94% on 6L with gait 97% on 5L end of session      Recommendations for follow up therapy are one component of a multi-disciplinary discharge planning process, led by the attending physician.  Recommendations may be updated based on patient status, additional functional criteria and insurance authorization.  Follow Up Recommendations       Assistance Recommended at Discharge Intermittent Supervision/Assistance  Patient can return home with the following  A little help with walking and/or transfers;A little help with bathing/dressing/bathroom;Assistance with cooking/housework;Assist for transportation;Help with stairs or ramp for entrance    Equipment Recommendations Rolling walker (2 wheels)  Recommendations for Other Services       Functional Status Assessment Patient has had a recent decline in their functional status and demonstrates the ability to make significant improvements in function in a reasonable and  predictable amount of time.     Precautions / Restrictions Precautions Precautions: Sternal Precaution Booklet Issued: No Precaution Comments: chest tube      Mobility  Bed Mobility Overal bed mobility: Needs Assistance Bed Mobility: Sit to Supine       Sit to supine: Mod assist   General bed mobility comments: physical assist to lift legs to surface with cues for sequence and precautions    Transfers Overall transfer level: Needs assistance   Transfers: Sit to/from Stand Sit to Stand: Min assist           General transfer comment: min assist to rise from chair x 2 trials with cues for hand placement and safety    Ambulation/Gait Ambulation/Gait assistance: Min assist, +2 safety/equipment Gait Distance (Feet): 40 Feet Assistive device: Rolling walker (2 wheels) Gait Pattern/deviations: Step-through pattern, Decreased stride length   Gait velocity interpretation: <1.8 ft/sec, indicate of risk for recurrent falls   General Gait Details: cues for posture and proximity to RW, close chair follow, limited by fatigue  Stairs            Wheelchair Mobility    Modified Rankin (Stroke Patients Only)       Balance Overall balance assessment: Needs assistance Sitting-balance support: No upper extremity supported, Feet supported Sitting balance-Leahy Scale: Good     Standing balance support: Bilateral upper extremity supported Standing balance-Leahy Scale: Poor Standing balance comment: able to static stand briefly without support, RW for gait                             Pertinent Vitals/Pain Pain Assessment Pain Assessment: 0-10 Pain Score:  8  Pain Location: sternal incision Pain Descriptors / Indicators: Aching, Constant Pain Intervention(s): Limited activity within patient's tolerance, Monitored during session, Repositioned    Home Living Family/patient expects to be discharged to:: Private residence Living Arrangements:  Spouse/significant other Available Help at Discharge: Family;Available 24 hours/day Type of Home: House Home Access: Stairs to enter   [employer] of Steps: 5   Home Layout: Two level;Able to live on main level with bedroom/bathroom Home Equipment: Shower seat Additional Comments: significant other was ordering RW and shower seat    Prior Function Prior Level of Function : Independent/Modified Independent                     Hand Dominance        Extremity/Trunk Assessment   Upper Extremity Assessment Upper Extremity Assessment: Generalized weakness    Lower Extremity Assessment Lower Extremity Assessment: Generalized weakness    Cervical / Trunk Assessment Cervical / Trunk Assessment: Kyphotic  Communication   Communication: No difficulties  Cognition Arousal/Alertness: Awake/alert Behavior During Therapy: WFL for tasks assessed/performed Overall Cognitive Status: Within Functional Limits for tasks assessed                                          General Comments      Exercises     Assessment/Plan    PT Assessment Patient needs continued PT services  PT Problem List Decreased strength;Decreased activity tolerance;Decreased balance;Decreased mobility;Decreased knowledge of precautions;Decreased knowledge of use of DME       PT Treatment Interventions DME instruction;Therapeutic exercise;Gait training;Stair training;Functional mobility training;Therapeutic activities;Patient/family education    PT Goals (Current goals can be found in the Care Plan section)  Acute Rehab PT Goals 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 PT Goal Formulation: With patient Time For Goal Achievement: 01/23/23 Potential to Achieve Goals: Good    Frequency Min 1X/week     Co-evaluation               AM-PAC PT "6 Clicks" Mobility  Outcome Measure Help needed turning from your back to your side while  in a flat bed without using bedrails?: A Little Help needed moving from lying on your back to sitting on the side of a flat bed without using bedrails?: A Lot Help needed moving to and from a bed to a chair (including a wheelchair)?: A Little Help needed standing up from a chair using your arms (e.g., wheelchair or bedside chair)?: A Little Help needed to walk in hospital room?: A Lot Help needed climbing 3-5 steps with a railing? : Total 6 Click Score: 14    End of Session Equipment Utilized During Treatment: Oxygen Activity Tolerance: Patient tolerated treatment well Patient left: in bed;with call bell/phone within reach;with nursing/sitter in room Nurse Communication: Mobility status;Precautions PT Visit Diagnosis: Other abnormalities of gait and mobility (R26.89);Difficulty in walking, not elsewhere classified (R26.2)    Time: 1610-9604 PT Time Calculation (min) (ACUTE ONLY): 28 min   Charges:   PT Evaluation $PT Eval Moderate Complexity: 1 Mod PT Treatments $Therapeutic Activity: 8-22 mins        Merryl Hacker, PT Acute Rehabilitation Services Office: [phone]   Cristine Polio 01/09/2023, 9:57 AM

## 2023-01-09 NOTE — Progress Notes (Signed)
NAME:  VASILI JANAK, MRN:  [id number], DOB:  October 15, 1955, LOS: 6 ADMISSION DATE:  01/02/2023, CONSULTATION DATE:  01/07/2023 REFERRING MD:  Dr. Laneta Simmers, CHIEF COMPLAINT:  SOB   History of Present Illness:   67 year old male with PMH as below significant for smokeless tobacco products, obesity, HTN, OSA, afib with RVR, mitral valve prolapse, HLD, GERD, ? Asthma, pre-diabetes who was admitted and underwent CABG x 3 on 5/9 with Dr. Laneta Simmers.  Patient with one year of progressive dyspnea, exertional fatigue, and lower extremity swelling found to significant three vessel disease CAD.  Patient found not to be using his pillow while coughing who developed worsening chest pain found to have sternal dehiscence on CT with two inferior wires pulled through sternum.  Went to OR 5/14 for sternal rewiring.  Noted some desaturations with induction, easy intubation and desaturates easily.  To rest overnight on mechanical ventilation, PCCM consulted to assist in vent and medical management while in ICU.   Pertinent  Medical History   Past Medical History:  Diagnosis Date   Acne    ADHD (attention deficit hyperactivity disorder)    Arthritis    Asthma    Bipolar 1 disorder (HCC)    Cancer (HCC)    basal cell cancer removed from back   Cataract    Coronary artery disease    Depression    Dyspnea    Dysrhythmia    PVCs   GERD (gastroesophageal reflux disease)    Heart murmur    Hepatitis    remote hx Hepatitis A (caused by food contaminant in childhood)   History of hiatal hernia    Hyperlipidemia    Hypertension    Mitral valve prolapse    Panic attacks    Peripheral vascular disease (HCC)    PFO (patent foramen ovale)    ? small PFO per echo   PONV (postoperative nausea and vomiting)    Pre-diabetes    Sleep apnea    Significant Hospital Events: Including procedures, antibiotic start and stop dates in addition to other pertinent events   To OR 5/14 for sternal re-wiring  5/15  extubated   Interim History / Subjective:  Up in chair  Objective   Blood pressure (Abnormal) 120/59, pulse 79, temperature 99.7 F (37.6 C), resp. rate (Abnormal) 22, height 5\' 11"  (1.803 m), weight (Abnormal) 149.1 kg, SpO2 100 %.    Vent Mode: PRVC FiO2 (%):  [50 %-100 %] 50 % Set Rate:  [18 bmp-22 bmp] 22 bmp Vt Set:  [600 mL] 600 mL PEEP:  [8 cmH20] 8 cmH20 Plateau Pressure:  [21 cmH20-29 cmH20] 21 cmH20   Intake/Output Summary (Last 24 hours) at 01/08/2023 0809 Last data filed at 01/08/2023 0800 Gross per 24 hour  Intake 2991.19 ml  Output 2290 ml  Net 701.19 ml   Filed Weights   01/07/23 0405 01/07/23 1140 01/08/23 0500  Weight: (Abnormal) 147.8 kg (Abnormal) 147.8 kg (Abnormal) 149.1 kg    Examination: General 68 year old male sitting up in chair no acute distress HEENT normocephalic atraumatic no jugular venous distention Pulmonary: Diffuse wheezing, currently nasal cannula, no accessory use.  Feels like he needs to cough. Portable chest x-ray personally removed persistent left base atelectasis Cardiac: Regular rate and rhythm, sternal dressing intact Extremities: Warm dry Neuro: Awake oriented no focal deficits  Resolved Hospital Problem list    Assessment & Plan:   Severe CAD s/p CABG x 4 on 01/02/23 Sternal dehiscence s/p sternal rewiring  Afib with  RVR HTN HLD Plan Chest tube management per thoracic surgery  Aspirin and Crestor  Telemetry monitoring  Amiodarone IV Holding prior to admission high antihypertensives  Pain management  Agree with holding off on further Lasix today  Acute hypoxic respiratory failure  OSA Pleural effusions ?hx asthma  - does not appear to see pulmonologist, no prior PFTs, extubated on 5/15 Plan Wean supplemental oxygen Spirometry  Pulse oximetry Mobilize Ensure that he splints chest with cough  AKI Hyponatremia, hypervolemic  - felt secondary to diureses, s/p lasix/ metolazone given volume status.  Got Lasix  again yesterday, serum creatinine up some  plan Keep foley  Hold diuresis today  Expected post op ABLA, thrombocytopenia -hgb stable over night Plan Monitor   Pre-diabetes w/ hyperglycemia  - A1C 6.6;  Plan Adjust ssi   Bipolar Anxiety Plan lamictal  Best Practice (right click and "Reselect all SmartList Selections" daily)   Diet/type: Regular consistency (see orders) DVT prophylaxis: SCD GI prophylaxis: H2B Lines: Central line Foley:  Yes, and it is still needed Code Status:  full code Last date of multidisciplinary goals of care discussion [per primary]  Simonne Martinet ACNP-BC [facility] Pulmonary/Critical Care Pager # [phone] OR # [phone] if no answer

## 2023-01-09 NOTE — Progress Notes (Signed)
     [address]       [place] [address]             [phone]       EVENING ROUNDS  POD #2 Sp sternal rewiring Looks good Ambulating Pain under good control

## 2023-01-10 ENCOUNTER — Inpatient Hospital Stay (HOSPITAL_COMMUNITY): Payer: Medicare Other

## 2023-01-10 DIAGNOSIS — J9601 Acute respiratory failure with hypoxia: Secondary | ICD-10-CM | POA: Diagnosis not present

## 2023-01-10 LAB — BASIC METABOLIC PANEL
Anion gap: 8 (ref 5–15)
BUN: 31 mg/dL — ABNORMAL HIGH (ref 8–23)
CO2: 28 mmol/L (ref 22–32)
Calcium: 7.7 mg/dL — ABNORMAL LOW (ref 8.9–10.3)
Chloride: 93 mmol/L — ABNORMAL LOW (ref 98–111)
Creatinine, Ser: 1.31 mg/dL — ABNORMAL HIGH (ref 0.61–1.24)
GFR, Estimated: 60 mL/min — ABNORMAL LOW (ref >60)
Glucose, Bld: 126 mg/dL — ABNORMAL HIGH (ref 70–99)
Potassium: 3.4 mmol/L — ABNORMAL LOW (ref 3.5–5.1)
Sodium: 129 mmol/L — ABNORMAL LOW (ref 135–145)

## 2023-01-10 LAB — CBC
HCT: 29.6 % — ABNORMAL LOW (ref 39.0–52.0)
Hemoglobin: 9.3 g/dL — ABNORMAL LOW (ref 13.0–17.0)
MCH: 30.6 pg (ref 26.0–34.0)
MCHC: 31.4 g/dL (ref 30.0–36.0)
MCV: 97.4 fL (ref 80.0–100.0)
Platelets: 262 10*3/uL (ref 150–400)
RBC: 3.04 MIL/uL — ABNORMAL LOW (ref 4.22–5.81)
RDW: 13.8 % (ref 11.5–15.5)
WBC: 7.2 10*3/uL (ref 4.0–10.5)
nRBC: 0 % (ref 0.0–0.2)

## 2023-01-10 LAB — GLUCOSE, CAPILLARY
Glucose-Capillary: 103 mg/dL — ABNORMAL HIGH (ref 70–99)
Glucose-Capillary: 106 mg/dL — ABNORMAL HIGH (ref 70–99)
Glucose-Capillary: 121 mg/dL — ABNORMAL HIGH (ref 70–99)
Glucose-Capillary: 131 mg/dL — ABNORMAL HIGH (ref 70–99)
Glucose-Capillary: 166 mg/dL — ABNORMAL HIGH (ref 70–99)
Glucose-Capillary: 94 mg/dL (ref 70–99)
Glucose-Capillary: 96 mg/dL (ref 70–99)

## 2023-01-10 MED ORDER — GUAIFENESIN ER 600 MG PO TB12
1200.0000 mg | ORAL_TABLET | Freq: Two times a day (BID) | ORAL | Status: DC
  Administered 2023-01-10 – 2023-02-04 (×51): 1200 mg via ORAL
  Filled 2023-01-10 (×51): qty 2

## 2023-01-10 MED ORDER — POTASSIUM CHLORIDE CRYS ER 20 MEQ PO TBCR
20.0000 meq | EXTENDED_RELEASE_TABLET | ORAL | Status: AC
  Administered 2023-01-10 (×3): 20 meq via ORAL
  Filled 2023-01-10 (×3): qty 1

## 2023-01-10 NOTE — Progress Notes (Signed)
Physical Therapy Treatment Patient Details Name: Bryan Brock MRN: [id number] DOB: Sep 02, 1955 Today's Date: 01/10/2023   History of Present Illness 67 yo male admitted 5/9 for CABG x 4. 5/14 sternal rewiring due to dehiscence coughing. PMhx: HTN, HLD, bipolar d/o, obesity, sleep apnea, mitral valve prolapse, GERD    PT Comments    Pt pleasant and reports continued pain due to coughing. Pt with progressing but limited gait tolerance with pt fatigued after 70' needing chair pulled to him. Pt educated for all sternal precautions as he still has tendency to want to use arms with transfers. Pt educated for HEP and continued mobility progression. Pt reliant on 6L supplemental O2 with gait with SpO2 94% and back to 3L at rest at 95% Pre gait 87/59 (69) Post gait 107/68 (79) HR 98-110    Recommendations for follow up therapy are one component of a multi-disciplinary discharge planning process, led by the attending physician.  Recommendations may be updated based on patient status, additional functional criteria and insurance authorization.  Follow Up Recommendations       Assistance Recommended at Discharge Intermittent Supervision/Assistance  Patient can return home with the following A little help with walking and/or transfers;A little help with bathing/dressing/bathroom;Assistance with cooking/housework;Assist for transportation;Help with stairs or ramp for entrance   Equipment Recommendations  Rolling walker (2 wheels)    Recommendations for Other Services       Precautions / Restrictions Precautions Precautions: Sternal;Fall Precaution Comments: chest tube, watch SPO2     Mobility  Bed Mobility Overal bed mobility: Needs Assistance Bed Mobility: Rolling, Sidelying to Sit Rolling: Min assist Sidelying to sit: Min assist       General bed mobility comments: cues for sequence to maintain precautions with assist to rise from surface    Transfers Overall transfer level:  Needs assistance   Transfers: Sit to/from Stand Sit to Stand: Min assist           General transfer comment: min assist to rise from chair x 2 trials with cues for hand placement and safety    Ambulation/Gait Ambulation/Gait assistance: Min assist Gait Distance (Feet): 70 Feet Assistive device: Rolling walker (2 wheels) Gait Pattern/deviations: Step-through pattern, Decreased stride length   Gait velocity interpretation: <1.8 ft/sec, indicate of risk for recurrent falls   General Gait Details: mod cues for posture and proximity to RW. Pt with increased flexion with fatigue and needing chair pulled to him due to fatigue. Pt walked 70' x 2 trials with seated rest   Stairs             Wheelchair Mobility    Modified Rankin (Stroke Patients Only)       Balance Overall balance assessment: Needs assistance Sitting-balance support: No upper extremity supported, Feet supported Sitting balance-Leahy Scale: Good Sitting balance - Comments: EOB without support   Standing balance support: Bilateral upper extremity supported Standing balance-Leahy Scale: Poor Standing balance comment: RW in standing                            Cognition Arousal/Alertness: Awake/alert Behavior During Therapy: WFL for tasks assessed/performed Overall Cognitive Status: Within Functional Limits for tasks assessed                                          Exercises General Exercises - Lower Extremity Long Arc  Quad: AROM, Both, 20 reps, Seated Hip Flexion/Marching: AROM, Both, Seated    General Comments        Pertinent Vitals/Pain Pain Assessment Pain Score: 6  Pain Location: sternal incision Pain Descriptors / Indicators: Aching, Constant Pain Intervention(s): Limited activity within patient's tolerance, Monitored during session, Repositioned    Home Living                          Prior Function            PT Goals (current goals can  now be found in the care plan section) Progress towards PT goals: Progressing toward goals    Frequency    Min 1X/week      PT Plan Current plan remains appropriate    Co-evaluation              AM-PAC PT "6 Clicks" Mobility   Outcome Measure  Help needed turning from your back to your side while in a flat bed without using bedrails?: A Little Help needed moving from lying on your back to sitting on the side of a flat bed without using bedrails?: A Little Help needed moving to and from a bed to a chair (including a wheelchair)?: A Little Help needed standing up from a chair using your arms (e.g., wheelchair or bedside chair)?: A Little Help needed to walk in hospital room?: A Lot Help needed climbing 3-5 steps with a railing? : Total 6 Click Score: 15    End of Session Equipment Utilized During Treatment: Oxygen Activity Tolerance: Patient limited by fatigue Patient left: in chair;with call bell/phone within reach Nurse Communication: Mobility status;Precautions PT Visit Diagnosis: Other abnormalities of gait and mobility (R26.89);Difficulty in walking, not elsewhere classified (R26.2)     Time: 0981-1914 PT Time Calculation (min) (ACUTE ONLY): 37 min  Charges:  $Gait Training: 8-22 mins $Therapeutic Activity: 8-22 mins                     Merryl Hacker, PT Acute Rehabilitation Services Office: [phone]    Enedina Finner Mikhai Bienvenue 01/10/2023, 12:40 PM

## 2023-01-10 NOTE — TOC Progression Note (Signed)
Transition of Care [facility]) - Progression Note    Patient Details  Name: Bryan Brock MRN: [id number] Date of Birth: 11/18/1955  [facility] CM/SW Contact  Graves-Bigelow, Lamar Laundry, RN Phone Number: 01/10/2023, 12:40 PM  Clinical Narrative:  Patient discussed in progression rounds this morning. Patient POD-8 CABG and POD 3 sternal rewiring-Chest tubes remain in place. Case Manager following for additional transition of care needs as the patient progresses.    Expected Discharge Plan: Home w Home Health Services Barriers to Discharge: Continued Medical Work up  Expected Discharge Plan and Services In-house Referral: NA Discharge Planning Services: CM Consult   Living arrangements for the past 2 months: Single Family Home     HH Arranged: NA   Social Determinants of Health (SDOH) Interventions SDOH Screenings   Food Insecurity: No Food Insecurity (01/05/2023)  Housing: Low Risk  (01/05/2023)  Transportation Needs: No Transportation Needs (01/05/2023)  Utilities: Not At Risk (01/05/2023)  Alcohol Screen: Low Risk  (03/05/2022)  Depression (PHQ2-9): Low Risk  (10/31/2022)  Financial Resource Strain: Low Risk  (03/05/2022)  Physical Activity: Inactive (03/05/2022)  Social Connections: Moderately Integrated (03/05/2022)  Stress: No Stress Concern Present (03/05/2022)  Tobacco Use: High Risk (01/08/2023)    Readmission Risk Interventions     No data to display

## 2023-01-10 NOTE — Progress Notes (Signed)
3 Days Post-Op Procedure(s) (LRB): STERNAL REWIRING (N/A) Subjective:  Had a lot of pain in chest about 2 am but coughed up a plug of mucous and felt much better.  Went back into atrial fib last night.  Ambulated around the ICU this morning.  Objective: Vital signs in last 24 hours: Temp:  [98 F (36.7 C)-99.7 F (37.6 C)] 99.3 F (37.4 C) (05/17 0430) Pulse Rate:  [62-128] 87 (05/17 0430) Cardiac Rhythm: Atrial fibrillation (05/17 0400) Resp:  [15-37] 22 (05/17 0430) BP: (83-141)/(42-111) 88/66 (05/17 0400) SpO2:  [67 %-100 %] 72 % (05/17 0415) Arterial Line BP: (99-170)/(50-89) 111/50 (05/16 1000)  Hemodynamic parameters for last 24 hours:    Intake/Output from previous day: 05/16 0701 - 05/17 0700 In: 467.7 [I.V.:349.2; IV Piggyback:118.5] Out: 2245 [Urine:1775; Chest Tube:470] Intake/Output this shift: Total I/O In: 166.2 [I.V.:166.2] Out: 1225 [Urine:1025; Chest Tube:200]  General appearance: alert and cooperative Neurologic: intact Heart: irregularly irregular rhythm Lungs: clear to auscultation bilaterally Extremities: edema mild  Lab Results: Recent Labs    01/09/23 0335 01/10/23 0409  WBC 9.0 7.2  HGB 9.2* 9.3*  HCT 29.1* 29.6*  PLT 248 262   BMET:  Recent Labs    01/09/23 0335 01/10/23 0409  NA 130* 129*  K 3.8 3.4*  CL 92* 93*  CO2 27 28  GLUCOSE 112* 126*  BUN 35* 31*  CREATININE 1.45* 1.31*  CALCIUM 7.8* 7.7*    PT/INR: No results for input(s): "LABPROT", "INR" in the last 72 hours. ABG    Component Value Date/Time   PHART 7.399 01/08/2023 0830   HCO3 28.2 (H) 01/08/2023 0830   TCO2 30 01/08/2023 0830   ACIDBASEDEF 1.0 01/07/2023 1936   O2SAT 97 01/08/2023 0830   CBG (last 3)  Recent Labs    01/09/23 2006 01/10/23 0008 01/10/23 0416  GLUCAP 110* 106* 131*   CXR: mild left base atelectasis. Sternal wires look stable in position  Assessment/Plan: S/P Procedure(s) (LRB): STERNAL REWIRING (N/A)  POD 8 CABG POD 3  Sternal rewiring     Stable hemodynamics on Lopressor 25 bid.   Postop atrial fib with RVR. Continue IV amio. Bolus as needed   Volume excess:-1700 yesterday so should be close to preop wt. Hold off on further diuresis. Replace K+   Keep chest tubes in.    Will keep foley in today since he is not very mobile yet.   IS, OOB.   He is using pillow to stabilize chest when he coughs.   LOS: 8 days    [name] 01/10/2023

## 2023-01-10 NOTE — Evaluation (Signed)
Occupational Therapy Evaluation Patient Details Name: Bryan Brock MRN: [id number] DOB: [birth year] Today's Date: 01/10/2023   History of Present Illness 67 yo male admitted 5/9 for CABG x 4. 5/14 sternal rewiring due to dehiscence coughing. PMhx: HTN, HLD, bipolar d/o, obesity, sleep apnea, mitral valve prolapse, GERD   Clinical Impression   Patient admitted for the diagnosis above.  PTA he lives at home and needed no assist with ADL, iADL, or mobility.  Currently he is needing up to Min A for basic mobility and Max A for ADL completion from a sit to stand level.  He is doing better with sternal precautions, needing Min cues, and should progress to needing HH OT post acute.  OT is indicated in the acute setting to address deficits.        Recommendations for follow up therapy are one component of a multi-disciplinary discharge planning process, led by the attending physician.  Recommendations may be updated based on patient status, additional functional criteria and insurance authorization.   Assistance Recommended at Discharge Intermittent Supervision/Assistance  Patient can return home with the following Assist for transportation;Assistance with cooking/housework;A lot of help with bathing/dressing/bathroom;A little help with walking and/or transfers    Functional Status Assessment  Patient has not had a recent decline in their functional status  Equipment Recommendations  None recommended by OT    Recommendations for Other Services       Precautions / Restrictions Precautions Precautions: Sternal;Fall Precaution Comments: chest tube, watch SPO2 Restrictions Weight Bearing Restrictions: Yes Other Position/Activity Restrictions: sternal      Mobility Bed Mobility Overal bed mobility: Needs Assistance Bed Mobility: Sidelying to Sit   Sidelying to sit: Min assist         Patient Response: Cooperative  Transfers Overall transfer level: Needs assistance   Transfers:  Sit to/from Stand, Bed to chair/wheelchair/BSC Sit to Stand: Min assist     Step pivot transfers: Min assist            Balance Overall balance assessment: Needs assistance Sitting-balance support: No upper extremity supported, Feet supported Sitting balance-Leahy Scale: Good     Standing balance support: Single extremity supported Standing balance-Leahy Scale: Poor                             ADL either performed or assessed with clinical judgement   ADL       Grooming: Wash/dry hands;Wash/dry face;Set up;Sitting           Upper Body Dressing : Moderate assistance;Sitting   Lower Body Dressing: Maximal assistance;Sit to/from stand   Toilet Transfer: Minimal assistance;Stand-pivot;BSC/3in1                   Vision Baseline Vision/History: 1 Wears glasses Patient Visual Report: No change from baseline       Perception     Praxis      Pertinent Vitals/Pain Pain Assessment Pain Assessment: Faces Faces Pain Scale: Hurts little more Pain Location: sternal incision Pain Descriptors / Indicators: Aching, Constant, Tender Pain Intervention(s): Monitored during session     Hand Dominance Right   Extremity/Trunk Assessment Upper Extremity Assessment Upper Extremity Assessment: Generalized weakness   Lower Extremity Assessment Lower Extremity Assessment: Defer to PT evaluation   Cervical / Trunk Assessment Cervical / Trunk Assessment: Kyphotic   Communication Communication Communication: No difficulties   Cognition Arousal/Alertness: Awake/alert Behavior During Therapy: WFL for tasks assessed/performed Overall Cognitive Status: Within Functional Limits  for tasks assessed                                                        Home Living Family/patient expects to be discharged to:: Private residence Living Arrangements: Spouse/significant other Available Help at Discharge: Family;Available 24 hours/day Type  of Home: House Home Access: Stairs to enter [employer] of Steps: 5   Home Layout: Two level;Able to live on main level with bedroom/bathroom     Bathroom Shower/Tub: Chief Strategy Officer: Handicapped height Bathroom Accessibility: Yes How Accessible: Accessible via walker Home Equipment: Shower seat          Prior Functioning/Environment Prior Level of Function : Independent/Modified Independent                        OT Problem List: Decreased strength;Decreased range of motion;Decreased activity tolerance;Impaired balance (sitting and/or standing);Pain      OT Treatment/Interventions: Self-care/ADL training;Therapeutic exercise;Therapeutic activities;Patient/family education;Balance training;DME and/or AE instruction;Energy conservation    OT Goals(Current goals can be found in the care plan section) Acute Rehab OT Goals Patient Stated Goal: Return home OT Goal Formulation: With patient Time For Goal Achievement: 01/24/23 Potential to Achieve Goals: Good ADL Goals Pt Will Perform Grooming: with supervision;standing Pt Will Perform Upper Body Dressing: with supervision;sitting Pt Will Perform Lower Body Dressing: with min assist;sit to/from stand Pt Will Transfer to Toilet: with supervision;ambulating;regular height toilet Pt/caregiver will Perform Home Exercise Program: Increased ROM;Both right and left upper extremity  OT Frequency: Min 1X/week    Co-evaluation              AM-PAC OT "6 Clicks" Daily Activity     Outcome Measure Help from another person eating meals?: None Help from another person taking care of personal grooming?: None Help from another person toileting, which includes using toliet, bedpan, or urinal?: A Lot Help from another person bathing (including washing, rinsing, drying)?: A Lot Help from another person to put on and taking off regular upper body clothing?: A Lot Help from another person to put on and  taking off regular lower body clothing?: A Lot 6 Click Score: 16   End of Session Equipment Utilized During Treatment: Oxygen Nurse Communication: Mobility status  Activity Tolerance: Patient tolerated treatment well Patient left: in chair;with call bell/phone within reach  OT Visit Diagnosis: Unsteadiness on feet (R26.81);Muscle weakness (generalized) (M62.81)                Time: 1610-9604 OT Time Calculation (min): 21 min Charges:  OT General Charges $OT Visit: 1 Visit OT Evaluation $OT Eval Moderate Complexity: 1 Mod  01/10/2023  RP, OTR/L  Acute Rehabilitation Services  Office:  [phone]   Suzanna Obey 01/10/2023, 4:33 PM

## 2023-01-10 NOTE — Progress Notes (Signed)
NAME:  Bryan Brock, MRN:  [id number], DOB:  [birth year], LOS: 6 ADMISSION DATE:  01/02/2023, CONSULTATION DATE:  01/07/2023 REFERRING MD:  Dr. Laneta Simmers, CHIEF COMPLAINT:  SOB   History of Present Illness:   67 year old male with PMH as below significant for smokeless tobacco products, obesity, HTN, OSA, afib with RVR, mitral valve prolapse, HLD, GERD, ? Asthma, pre-diabetes who was admitted and underwent CABG x 3 on 5/9 with Dr. Laneta Simmers.  Patient with one year of progressive dyspnea, exertional fatigue, and lower extremity swelling found to significant three vessel disease CAD.  Patient found not to be using his pillow while coughing who developed worsening chest pain found to have sternal dehiscence on CT with two inferior wires pulled through sternum.  Went to OR 5/14 for sternal rewiring.  Noted some desaturations with induction, easy intubation and desaturates easily.  To rest overnight on mechanical ventilation, PCCM consulted to assist in vent and medical management while in ICU.   Pertinent  Medical History   Past Medical History:  Diagnosis Date   Acne    ADHD (attention deficit hyperactivity disorder)    Arthritis    Asthma    Bipolar 1 disorder (HCC)    Cancer (HCC)    basal cell cancer removed from back   Cataract    Coronary artery disease    Depression    Dyspnea    Dysrhythmia    PVCs   GERD (gastroesophageal reflux disease)    Heart murmur    Hepatitis    remote hx Hepatitis A (caused by food contaminant in childhood)   History of hiatal hernia    Hyperlipidemia    Hypertension    Mitral valve prolapse    Panic attacks    Peripheral vascular disease (HCC)    PFO (patent foramen ovale)    ? small PFO per echo   PONV (postoperative nausea and vomiting)    Pre-diabetes    Sleep apnea    Significant Hospital Events: Including procedures, antibiotic start and stop dates in addition to other pertinent events   To OR 5/14 for sternal re-wiring  5/15  extubated   Interim History / Subjective:  No distress Objective   Blood pressure (Abnormal) 120/59, pulse 79, temperature 99.7 F (37.6 C), resp. rate (Abnormal) 22, height 5\' 11"  (1.803 m), weight (Abnormal) 149.1 kg, SpO2 100 %.    Vent Mode: PRVC FiO2 (%):  [50 %-100 %] 50 % Set Rate:  [18 bmp-22 bmp] 22 bmp Vt Set:  [600 mL] 600 mL PEEP:  [8 cmH20] 8 cmH20 Plateau Pressure:  [21 cmH20-29 cmH20] 21 cmH20   Intake/Output Summary (Last 24 hours) at 01/08/2023 0809 Last data filed at 01/08/2023 0800 Gross per 24 hour  Intake 2991.19 ml  Output 2290 ml  Net 701.19 ml   Filed Weights   01/07/23 0405 01/07/23 1140 01/08/23 0500  Weight: (Abnormal) 147.8 kg (Abnormal) 147.8 kg (Abnormal) 149.1 kg    Examination: General up in chair no acute distress HEENT normocephalic atraumatic no jugular venous distention appreciated Pulmonary: Clear, diminished bases, no accessory use Cardiac: Regular rate and rhythm.  Sternal dressing intact.  Doing better with splinting Abdomen: Soft nontender Extremities: Warm dry Neuro: Intact. Resolved Hospital Problem list    Assessment & Plan:   Severe CAD s/p CABG x 4 on 01/02/23 Sternal dehiscence s/p sternal rewiring  Afib with RVR HTN HLD Plan Chest tube management per thoracic surgery  Aspirin and Crestor  Telemetry monitoring  Amiodarone IV Pain management  Agree with holding off on further Lasix today  Acute hypoxic respiratory failure  OSA Pleural effusions ?hx asthma  - does not appear to see pulmonologist, no prior PFTs, extubated on 5/15 Plan Wean supplemental oxygen Spirometry  Pulse oximetry Mobilize Ensure that he splints chest with cough Adding Mucinex  AKI Hyponatremia, hypervolemic  - felt secondary to diureses, s/p lasix/ metolazone given volume status.  Got Lasix again yesterday, serum creatinine up some  plan Will hold on further diuresis Keeping Foley  Expected post op ABLA, thrombocytopenia -hgb  stable over night Plan Monitor   Pre-diabetes w/ hyperglycemia  - A1C 6.6;  Plan Sliding scale insulin  Bipolar Anxiety Plan lamictal  Best Practice (right click and "Reselect all SmartList Selections" daily)   Diet/type: Regular consistency (see orders) DVT prophylaxis: SCD GI prophylaxis: H2B Lines: Central line Foley:  Yes, and it is still needed Code Status:  full code Last date of multidisciplinary goals of care discussion [per primary]  Available as needed Simonne Martinet ACNP-BC [facility] Pulmonary/Critical Care Pager # [phone] OR # [phone] if no answer

## 2023-01-10 NOTE — Progress Notes (Signed)
      [address]       [place] [address]             [phone]    POD # 8 CABG, # 3 sternal rewiring  Some incisional pain  BP 99/61   Pulse 68   Temp 97.8 F (36.6 C)   Resp 16   Ht 5\' 11"  (1.803 m)   Wt (!) 146.7 kg   SpO2 100%   BMI 45.11 kg/m    2L 96% sat   Intake/Output Summary (Last 24 hours) at 01/10/2023 1634 Last data filed at 01/10/2023 1600 Gross per 24 hour  Intake 879.38 ml  Output 2930 ml  Net -2050.62 ml   CT 170 ml so far today  Viviann Spare C. Dorris Fetch, MD [facility] [phone]

## 2023-01-11 ENCOUNTER — Inpatient Hospital Stay (HOSPITAL_COMMUNITY): Payer: Medicare Other

## 2023-01-11 LAB — CBC
HCT: 29.3 % — ABNORMAL LOW (ref 39.0–52.0)
Hemoglobin: 9.2 g/dL — ABNORMAL LOW (ref 13.0–17.0)
MCH: 30.5 pg (ref 26.0–34.0)
MCHC: 31.4 g/dL (ref 30.0–36.0)
MCV: 97 fL (ref 80.0–100.0)
Platelets: 295 10*3/uL (ref 150–400)
RBC: 3.02 MIL/uL — ABNORMAL LOW (ref 4.22–5.81)
RDW: 13.7 % (ref 11.5–15.5)
WBC: 6.7 10*3/uL (ref 4.0–10.5)
nRBC: 0 % (ref 0.0–0.2)

## 2023-01-11 LAB — BASIC METABOLIC PANEL
Anion gap: 8 (ref 5–15)
BUN: 24 mg/dL — ABNORMAL HIGH (ref 8–23)
CO2: 30 mmol/L (ref 22–32)
Calcium: 7.7 mg/dL — ABNORMAL LOW (ref 8.9–10.3)
Chloride: 93 mmol/L — ABNORMAL LOW (ref 98–111)
Creatinine, Ser: 1.11 mg/dL (ref 0.61–1.24)
GFR, Estimated: >60 mL/min (ref >60)
Glucose, Bld: 114 mg/dL — ABNORMAL HIGH (ref 70–99)
Potassium: 3.7 mmol/L (ref 3.5–5.1)
Sodium: 131 mmol/L — ABNORMAL LOW (ref 135–145)

## 2023-01-11 LAB — GLUCOSE, CAPILLARY
Glucose-Capillary: 121 mg/dL — ABNORMAL HIGH (ref 70–99)
Glucose-Capillary: 158 mg/dL — ABNORMAL HIGH (ref 70–99)
Glucose-Capillary: 113 mg/dL — ABNORMAL HIGH (ref 70–99)
Glucose-Capillary: 94 mg/dL (ref 70–99)
Glucose-Capillary: 143 mg/dL — ABNORMAL HIGH (ref 70–99)
Glucose-Capillary: 114 mg/dL — ABNORMAL HIGH (ref 70–99)

## 2023-01-11 MED ORDER — FUROSEMIDE 40 MG PO TABS
40.0000 mg | ORAL_TABLET | Freq: Every day | ORAL | Status: DC
  Administered 2023-01-11: 40 mg via ORAL
  Filled 2023-01-11: qty 1

## 2023-01-11 MED ORDER — POTASSIUM CHLORIDE ER 10 MEQ PO TBCR
40.0000 meq | EXTENDED_RELEASE_TABLET | Freq: Two times a day (BID) | ORAL | Status: AC
  Administered 2023-01-11 (×2): 40 meq via ORAL
  Filled 2023-01-11 (×4): qty 4

## 2023-01-11 MED ORDER — AMIODARONE IV BOLUS ONLY 150 MG/100ML
150.0000 mg | Freq: Once | INTRAVENOUS | Status: AC
  Administered 2023-01-11: 150 mg via INTRAVENOUS

## 2023-01-11 MED ORDER — METOPROLOL TARTRATE 25 MG PO TABS
37.5000 mg | ORAL_TABLET | Freq: Two times a day (BID) | ORAL | Status: DC
  Administered 2023-01-11 – 2023-02-04 (×49): 37.5 mg via ORAL
  Filled 2023-01-11 (×49): qty 1

## 2023-01-11 NOTE — Progress Notes (Signed)
Case TCTS note reveiwed.  Discussed with RN.  Dr. Dorris Fetch on this weekend so will follow peripherally and pick back up Monday depending if still in ICU.    Myrla Halsted MD PCCM

## 2023-01-11 NOTE — Progress Notes (Signed)
4 Days Post-Op Procedure(s) (LRB): STERNAL REWIRING (N/A) Subjective: Some pain when coughing, otherwise feels well  Objective: Vital signs in last 24 hours: Temp:  [97.8 F (36.6 C)-98.7 F (37.1 C)] 98.2 F (36.8 C) (05/18 0710) Pulse Rate:  [59-145] 107 (05/18 0822) Cardiac Rhythm: Atrial fibrillation (05/18 0822) Resp:  [14-30] 25 (05/18 0822) BP: (87-147)/(46-86) 130/59 (05/18 0822) SpO2:  [92 %-100 %] 100 % (05/18 0822) Weight:  [136.6 kg] 136.6 kg (05/18 0500)  Hemodynamic parameters for last 24 hours:    Intake/Output from previous day: 05/17 0701 - 05/18 0700 In: 746.7 [P.O.:480; I.V.:266.7] Out: 3140 [Urine:2600; Chest Tube:540] Intake/Output this shift: Total I/O In: 390.1 [P.O.:240; I.V.:150.1] Out: 220 [Urine:150; Chest Tube:70]  General appearance: alert, cooperative, and no distress Neurologic: intact Heart: irregularly irregular rhythm Lungs: diminished breath sounds bibasilar Wound: dressing clean and dry  Lab Results: Recent Labs    01/10/23 0409 01/11/23 0515  WBC 7.2 6.7  HGB 9.3* 9.2*  HCT 29.6* 29.3*  PLT 262 295   BMET:  Recent Labs    01/10/23 0409 01/11/23 0515  NA 129* 131*  K 3.4* 3.7  CL 93* 93*  CO2 28 30  GLUCOSE 126* 114*  BUN 31* 24*  CREATININE 1.31* 1.11  CALCIUM 7.7* 7.7*    PT/INR: No results for input(s): "LABPROT", "INR" in the last 72 hours. ABG    Component Value Date/Time   PHART 7.399 01/08/2023 0830   HCO3 28.2 (H) 01/08/2023 0830   TCO2 30 01/08/2023 0830   ACIDBASEDEF 1.0 01/07/2023 1936   O2SAT 97 01/08/2023 0830   CBG (last 3)  Recent Labs    01/10/23 2323 01/11/23 0351 01/11/23 0710  GLUCAP 103* 121* 158*    Assessment/Plan: S/P Procedure(s) (LRB): STERNAL REWIRING (N/A) POD # 9/4 NEURO- intact CV- back in atrial fib this AM with rate 100-120  Rebolus amiodarone, continue amiodarone drip  Increase metoprolol to 37> 5 mg BID  ASA, statin RESP- continue IS for atelectasis RENAL-  creatinine normal  Hyponatermia improved  PO Lasix, K ENDO- CBG mildly elevated  Continue current regimen GI- tolerating diet Sternal wound intact, wires unchanged on CXR- monitor Ambulate   LOS: 9 days    Loreli Slot 01/11/2023

## 2023-01-11 NOTE — Progress Notes (Signed)
      [address]       [place] [address]             [phone]      Comfortable at present Says he has felt some movement in sternum On exam no obvious instability to light palpation Continue to monitor wound  Was in SR earlier went back into A fib after a walk Rate controlled at present  [place] C. Dorris Fetch, MD [facility] [phone]

## 2023-01-12 ENCOUNTER — Inpatient Hospital Stay (HOSPITAL_COMMUNITY): Payer: Medicare Other

## 2023-01-12 LAB — CBC
HCT: 32.6 % — ABNORMAL LOW (ref 39.0–52.0)
Hemoglobin: 10.1 g/dL — ABNORMAL LOW (ref 13.0–17.0)
MCH: 29.8 pg (ref 26.0–34.0)
MCHC: 31 g/dL (ref 30.0–36.0)
MCV: 96.2 fL (ref 80.0–100.0)
Platelets: 371 10*3/uL (ref 150–400)
RBC: 3.39 MIL/uL — ABNORMAL LOW (ref 4.22–5.81)
RDW: 13.8 % (ref 11.5–15.5)
WBC: 7.5 10*3/uL (ref 4.0–10.5)
nRBC: 0.3 % — ABNORMAL HIGH (ref 0.0–0.2)

## 2023-01-12 LAB — BASIC METABOLIC PANEL
Anion gap: 9 (ref 5–15)
BUN: 21 mg/dL (ref 8–23)
CO2: 28 mmol/L (ref 22–32)
Calcium: 8 mg/dL — ABNORMAL LOW (ref 8.9–10.3)
Chloride: 94 mmol/L — ABNORMAL LOW (ref 98–111)
Creatinine, Ser: 1.11 mg/dL (ref 0.61–1.24)
GFR, Estimated: >60 mL/min (ref >60)
Glucose, Bld: 162 mg/dL — ABNORMAL HIGH (ref 70–99)
Potassium: 3.6 mmol/L (ref 3.5–5.1)
Sodium: 131 mmol/L — ABNORMAL LOW (ref 135–145)

## 2023-01-12 LAB — GLUCOSE, CAPILLARY
Glucose-Capillary: 106 mg/dL — ABNORMAL HIGH (ref 70–99)
Glucose-Capillary: 190 mg/dL — ABNORMAL HIGH (ref 70–99)
Glucose-Capillary: 112 mg/dL — ABNORMAL HIGH (ref 70–99)
Glucose-Capillary: 109 mg/dL — ABNORMAL HIGH (ref 70–99)
Glucose-Capillary: 119 mg/dL — ABNORMAL HIGH (ref 70–99)

## 2023-01-12 MED ORDER — POTASSIUM CHLORIDE CRYS ER 20 MEQ PO TBCR
40.0000 meq | EXTENDED_RELEASE_TABLET | Freq: Once | ORAL | Status: AC
  Administered 2023-01-12: 40 meq via ORAL
  Filled 2023-01-12: qty 2

## 2023-01-12 MED ORDER — AMIODARONE HCL 200 MG PO TABS
400.0000 mg | ORAL_TABLET | Freq: Two times a day (BID) | ORAL | Status: DC
  Administered 2023-01-12 – 2023-01-18 (×13): 400 mg via ORAL
  Filled 2023-01-12 (×13): qty 2

## 2023-01-12 MED ORDER — CALCIUM CARBONATE ANTACID 500 MG PO CHEW
400.0000 mg | CHEWABLE_TABLET | Freq: Two times a day (BID) | ORAL | Status: DC | PRN
  Administered 2023-01-12: 400 mg via ORAL
  Filled 2023-01-12: qty 2

## 2023-01-12 MED ORDER — FUROSEMIDE 40 MG PO TABS
40.0000 mg | ORAL_TABLET | Freq: Two times a day (BID) | ORAL | Status: DC
  Administered 2023-01-12 (×2): 40 mg via ORAL
  Filled 2023-01-12 (×3): qty 1

## 2023-01-12 NOTE — Progress Notes (Signed)
      [address]       [place] [address]             [phone]       No new issues today  BP 129/77   Pulse 75   Temp 98.5 F (36.9 C) (Oral)   Resp (!) 26   Ht 5\' 11"  (1.803 m)   Wt (!) 144.9 kg   SpO2 97%   BMI 44.55 kg/m   In and out of Atrial fib but rate controlled   Intake/Output Summary (Last 24 hours) at 01/12/2023 1758 Last data filed at 01/12/2023 1700 Gross per 24 hour  Intake 914.14 ml  Output 2475 ml  Net -1560.86 ml   Stable day so far  Viviann Spare C. Dorris Fetch, MD [facility] [phone]

## 2023-01-12 NOTE — Progress Notes (Signed)
5 Days Post-Op Procedure(s) (LRB): STERNAL REWIRING (N/A) Subjective: No complaints this AM Not feeling any significant sternal movement  Objective: Vital signs in last 24 hours: Temp:  [97.9 F (36.6 C)-98.9 F (37.2 C)] 98.5 F (36.9 C) (05/19 0803) Pulse Rate:  [60-152] 102 (05/19 0900) Cardiac Rhythm: Atrial fibrillation (05/19 0805) Resp:  [5-31] 13 (05/19 0900) BP: (88-151)/(41-105) 108/53 (05/19 0900) SpO2:  [93 %-100 %] 100 % (05/19 0900) Weight:  [144.9 kg] 144.9 kg (05/19 0500)  Hemodynamic parameters for last 24 hours:    Intake/Output from previous day: 05/18 0701 - 05/19 0700 In: 1478.6 [P.O.:960; I.V.:518.6] Out: 3205 [Urine:2325; Chest Tube:880] Intake/Output this shift: Total I/O In: 33.4 [I.V.:33.4] Out: 320 [Urine:250; Chest Tube:70]  General appearance: alert, cooperative, and no distress Neurologic: intact Heart: regular rate and rhythm Lungs: diminished breath sounds bilaterally Abdomen: normal findings: soft, non-tender Wound: clean and dry, sternum stable  Lab Results: Recent Labs    01/11/23 0515 01/12/23 0559  WBC 6.7 7.5  HGB 9.2* 10.1*  HCT 29.3* 32.6*  PLT 295 371   BMET:  Recent Labs    01/11/23 0515 01/12/23 0559  NA 131* 131*  K 3.7 3.6  CL 93* 94*  CO2 30 28  GLUCOSE 114* 162*  BUN 24* 21  CREATININE 1.11 1.11  CALCIUM 7.7* 8.0*    PT/INR: No results for input(s): "LABPROT", "INR" in the last 72 hours. ABG    Component Value Date/Time   PHART 7.399 01/08/2023 0830   HCO3 28.2 (H) 01/08/2023 0830   TCO2 30 01/08/2023 0830   ACIDBASEDEF 1.0 01/07/2023 1936   O2SAT 97 01/08/2023 0830   CBG (last 3)  Recent Labs    01/11/23 2324 01/12/23 0312 01/12/23 0759  GLUCAP 114* 106* 190*    Assessment/Plan: S/P Procedure(s) (LRB): STERNAL REWIRING (N/A) POD # 10/5 CV- in SR most of the time over past 24 hours, with brief runs of A fib  Will change Amiodarone to PO  Continue Lopressor 37.5 mg BID RESP- CXR again  shows atelectasis and likely effusions unchanged  Continue IS RENAL- creatinine normal   Weight down 4 pounds over past 48 hours  Still well above preop - Will increase Lasix to BID ENDO- CBG better but still high in AM Gi- tolerating diet Wound intact, sternum atable CT 880 ml over past 24 hours- keep in place   LOS: 10 days    Loreli Slot 01/12/2023

## 2023-01-13 ENCOUNTER — Inpatient Hospital Stay (HOSPITAL_COMMUNITY): Payer: Medicare Other

## 2023-01-13 DIAGNOSIS — J9811 Atelectasis: Secondary | ICD-10-CM

## 2023-01-13 DIAGNOSIS — Z951 Presence of aortocoronary bypass graft: Secondary | ICD-10-CM | POA: Diagnosis not present

## 2023-01-13 LAB — GLUCOSE, CAPILLARY
Glucose-Capillary: 135 mg/dL — ABNORMAL HIGH (ref 70–99)
Glucose-Capillary: 102 mg/dL — ABNORMAL HIGH (ref 70–99)
Glucose-Capillary: 135 mg/dL — ABNORMAL HIGH (ref 70–99)
Glucose-Capillary: 117 mg/dL — ABNORMAL HIGH (ref 70–99)

## 2023-01-13 LAB — BASIC METABOLIC PANEL
Anion gap: 8 (ref 5–15)
BUN: 20 mg/dL (ref 8–23)
CO2: 26 mmol/L (ref 22–32)
Calcium: 7.7 mg/dL — ABNORMAL LOW (ref 8.9–10.3)
Chloride: 94 mmol/L — ABNORMAL LOW (ref 98–111)
Creatinine, Ser: 1.14 mg/dL (ref 0.61–1.24)
GFR, Estimated: >60 mL/min (ref >60)
Glucose, Bld: 118 mg/dL — ABNORMAL HIGH (ref 70–99)
Potassium: 3.7 mmol/L (ref 3.5–5.1)
Sodium: 128 mmol/L — ABNORMAL LOW (ref 135–145)

## 2023-01-13 LAB — CBC
HCT: 30.4 % — ABNORMAL LOW (ref 39.0–52.0)
Hemoglobin: 9.7 g/dL — ABNORMAL LOW (ref 13.0–17.0)
MCH: 30.5 pg (ref 26.0–34.0)
MCHC: 31.9 g/dL (ref 30.0–36.0)
MCV: 95.6 fL (ref 80.0–100.0)
Platelets: 367 10*3/uL (ref 150–400)
RBC: 3.18 MIL/uL — ABNORMAL LOW (ref 4.22–5.81)
RDW: 13.8 % (ref 11.5–15.5)
WBC: 7.5 10*3/uL (ref 4.0–10.5)
nRBC: 0 % (ref 0.0–0.2)

## 2023-01-13 MED ORDER — TORSEMIDE 20 MG PO TABS
40.0000 mg | ORAL_TABLET | Freq: Every day | ORAL | Status: DC
  Administered 2023-01-13 – 2023-01-14 (×2): 40 mg via ORAL
  Filled 2023-01-13 (×2): qty 2

## 2023-01-13 MED ORDER — POTASSIUM CHLORIDE CRYS ER 20 MEQ PO TBCR
20.0000 meq | EXTENDED_RELEASE_TABLET | ORAL | Status: AC
  Administered 2023-01-13 (×3): 20 meq via ORAL
  Filled 2023-01-13 (×3): qty 1

## 2023-01-13 MED ORDER — INSULIN ASPART 100 UNIT/ML IJ SOLN
0.0000 [IU] | Freq: Three times a day (TID) | INTRAMUSCULAR | Status: DC
  Administered 2023-01-14: 4 [IU] via SUBCUTANEOUS
  Administered 2023-01-14 (×2): 2 [IU] via SUBCUTANEOUS
  Administered 2023-01-14: 4 [IU] via SUBCUTANEOUS
  Administered 2023-01-15 (×2): 2 [IU] via SUBCUTANEOUS
  Administered 2023-01-16: 4 [IU] via SUBCUTANEOUS
  Administered 2023-01-16 – 2023-01-24 (×16): 2 [IU] via SUBCUTANEOUS
  Administered 2023-01-24 – 2023-01-25 (×2): 4 [IU] via SUBCUTANEOUS
  Administered 2023-01-25 – 2023-01-29 (×6): 2 [IU] via SUBCUTANEOUS
  Administered 2023-01-29: 4 [IU] via SUBCUTANEOUS
  Administered 2023-01-30 – 2023-02-01 (×5): 2 [IU] via SUBCUTANEOUS
  Administered 2023-02-01: 4 [IU] via SUBCUTANEOUS
  Administered 2023-02-02: 2 [IU] via SUBCUTANEOUS
  Administered 2023-02-02: 4 [IU] via SUBCUTANEOUS
  Administered 2023-02-02 – 2023-02-04 (×4): 2 [IU] via SUBCUTANEOUS

## 2023-01-13 MED ORDER — ASPIRIN 325 MG PO TBEC: 325.0000 mg | DELAYED_RELEASE_TABLET | Freq: Every day | ORAL | Status: DC

## 2023-01-13 MED ORDER — SODIUM CHLORIDE 0.9 % IV SOLN: 250.0000 mL | INTRAVENOUS | Status: DC | PRN

## 2023-01-13 MED ORDER — DOCUSATE SODIUM 100 MG PO CAPS
200.0000 mg | ORAL_CAPSULE | Freq: Every day | ORAL | Status: DC
  Administered 2023-01-14 – 2023-02-04 (×19): 200 mg via ORAL
  Filled 2023-01-13 (×19): qty 2

## 2023-01-13 MED ORDER — SODIUM CHLORIDE 0.9% FLUSH
3.0000 mL | Freq: Two times a day (BID) | INTRAVENOUS | Status: DC
  Administered 2023-01-13: 3 mL via INTRAVENOUS

## 2023-01-13 MED ORDER — ~~LOC~~ CARDIAC SURGERY, PATIENT & FAMILY EDUCATION: Freq: Once | Status: AC

## 2023-01-13 MED ORDER — SODIUM CHLORIDE 0.9% FLUSH: 3.0000 mL | INTRAVENOUS | Status: DC | PRN

## 2023-01-13 MED ORDER — POTASSIUM CHLORIDE CRYS ER 20 MEQ PO TBCR
20.0000 meq | EXTENDED_RELEASE_TABLET | Freq: Two times a day (BID) | ORAL | Status: AC
  Administered 2023-01-14 (×2): 20 meq via ORAL
  Filled 2023-01-13 (×2): qty 1

## 2023-01-13 NOTE — Progress Notes (Signed)
Report given to 4E RN 

## 2023-01-13 NOTE — Plan of Care (Signed)
Problem: Education: Goal: Knowledge of General Education information will improve Description: Including pain rating scale, medication(s)/side effects and non-pharmacologic comfort measures 01/13/2023 2050 by Brooke Bonito, RN Outcome: Progressing 01/13/2023 2050 by Brooke Bonito, RN Outcome: Progressing   Problem: Health Behavior/Discharge Planning: Goal: Ability to manage health-related needs will improve 01/13/2023 2050 by Brooke Bonito, RN Outcome: Progressing 01/13/2023 2050 by Brooke Bonito, RN Outcome: Progressing   Problem: Clinical Measurements: Goal: Ability to maintain clinical measurements within normal limits will improve 01/13/2023 2050 by Brooke Bonito, RN Outcome: Progressing 01/13/2023 2050 by Brooke Bonito, RN Outcome: Progressing Goal: Will remain free from infection 01/13/2023 2050 by Brooke Bonito, RN Outcome: Progressing 01/13/2023 2050 by Brooke Bonito, RN Outcome: Progressing Goal: Diagnostic test results will improve 01/13/2023 2050 by Brooke Bonito, RN Outcome: Progressing 01/13/2023 2050 by Brooke Bonito, RN Outcome: Progressing Goal: Respiratory complications will improve 01/13/2023 2050 by Brooke Bonito, RN Outcome: Progressing 01/13/2023 2050 by Brooke Bonito, RN Outcome: Progressing Goal: Cardiovascular complication will be avoided 01/13/2023 2050 by Brooke Bonito, RN Outcome: Progressing 01/13/2023 2050 by Brooke Bonito, RN Outcome: Progressing   Problem: Activity: Goal: Risk for activity intolerance will decrease 01/13/2023 2050 by Brooke Bonito, RN Outcome: Progressing 01/13/2023 2050 by Brooke Bonito, RN Outcome: Progressing   Problem: Nutrition: Goal: Adequate nutrition will be maintained 01/13/2023 2050 by Brooke Bonito, RN Outcome: Progressing 01/13/2023 2050 by Brooke Bonito, RN Outcome: Progressing   Problem: Coping: Goal: Level of anxiety will decrease 01/13/2023 2050 by Brooke Bonito, RN Outcome: Progressing 01/13/2023 2050 by Brooke Bonito, RN Outcome: Progressing   Problem: Elimination: Goal: Will not experience complications related to bowel motility 01/13/2023 2050 by Brooke Bonito, RN Outcome: Progressing 01/13/2023 2050 by Brooke Bonito, RN Outcome: Progressing Goal: Will not experience complications related to urinary retention 01/13/2023 2050 by Brooke Bonito, RN Outcome: Progressing 01/13/2023 2050 by Brooke Bonito, RN Outcome: Progressing   Problem: Pain Managment: Goal: General experience of comfort will improve 01/13/2023 2050 by Brooke Bonito, RN Outcome: Progressing 01/13/2023 2050 by Brooke Bonito, RN Outcome: Progressing   Problem: Safety: Goal: Ability to remain free from injury will improve 01/13/2023 2050 by Brooke Bonito, RN Outcome: Progressing 01/13/2023 2050 by Brooke Bonito, RN Outcome: Progressing   Problem: Skin Integrity: Goal: Risk for impaired skin integrity will decrease 01/13/2023 2050 by Brooke Bonito, RN Outcome: Progressing 01/13/2023 2050 by Brooke Bonito, RN Outcome: Progressing   Problem: Education: Goal: Will demonstrate proper wound care and an understanding of methods to prevent future damage 01/13/2023 2050 by Brooke Bonito, RN Outcome: Progressing 01/13/2023 2050 by Brooke Bonito, RN Outcome: Progressing Goal: Knowledge of disease or condition will improve 01/13/2023 2050 by Brooke Bonito, RN Outcome: Progressing 01/13/2023 2050 by Brooke Bonito, RN Outcome: Progressing Goal: Knowledge of the prescribed therapeutic regimen will improve 01/13/2023 2050 by Brooke Bonito, RN Outcome: Progressing 01/13/2023 2050 by Brooke Bonito, RN Outcome: Progressing Goal: Individualized Educational Video(s) 01/13/2023 2050 by Brooke Bonito, RN Outcome: Progressing 01/13/2023 2050 by Brooke Bonito, RN Outcome: Progressing   Problem: Activity: Goal: Risk for activity  intolerance will decrease 01/13/2023 2050 by Brooke Bonito, RN Outcome: Progressing 01/13/2023 2050 by Brooke Bonito, RN Outcome: Progressing   Problem: Cardiac: Goal: Will achieve and/or maintain hemodynamic stability 01/13/2023 2050 by Brooke Bonito, RN Outcome: Progressing 01/13/2023 2050  by Brooke Bonito, RN Outcome: Progressing   Problem: Clinical Measurements: Goal: Postoperative complications will be avoided or minimized 01/13/2023 2050 by Brooke Bonito, RN Outcome: Progressing 01/13/2023 2050 by Brooke Bonito, RN Outcome: Progressing   Problem: Respiratory: Goal: Respiratory status will improve 01/13/2023 2050 by Brooke Bonito, RN Outcome: Progressing 01/13/2023 2050 by Brooke Bonito, RN Outcome: Progressing   Problem: Skin Integrity: Goal: Wound healing without signs and symptoms of infection 01/13/2023 2050 by Brooke Bonito, RN Outcome: Progressing 01/13/2023 2050 by Brooke Bonito, RN Outcome: Progressing Goal: Risk for impaired skin integrity will decrease 01/13/2023 2050 by Brooke Bonito, RN Outcome: Progressing 01/13/2023 2050 by Brooke Bonito, RN Outcome: Progressing   Problem: Urinary Elimination: Goal: Ability to achieve and maintain adequate renal perfusion and functioning will improve 01/13/2023 2050 by Brooke Bonito, RN Outcome: Progressing 01/13/2023 2050 by Brooke Bonito, RN Outcome: Progressing   Problem: Education: Goal: Ability to describe self-care measures that may prevent or decrease complications (Diabetes Survival Skills Education) will improve Outcome: Progressing Goal: Individualized Educational Video(s) Outcome: Progressing   Problem: Coping: Goal: Ability to adjust to condition or change in health will improve Outcome: Progressing   Problem: Fluid Volume: Goal: Ability to maintain a balanced intake and output will improve Outcome: Progressing   Problem: Health Behavior/Discharge Planning: Goal:  Ability to identify and utilize available resources and services will improve Outcome: Progressing Goal: Ability to manage health-related needs will improve Outcome: Progressing   Problem: Metabolic: Goal: Ability to maintain appropriate glucose levels will improve Outcome: Progressing   Problem: Nutritional: Goal: Maintenance of adequate nutrition will improve Outcome: Progressing Goal: Progress toward achieving an optimal weight will improve Outcome: Progressing   Problem: Skin Integrity: Goal: Risk for impaired skin integrity will decrease Outcome: Progressing   Problem: Tissue Perfusion: Goal: Adequacy of tissue perfusion will improve Outcome: Progressing

## 2023-01-13 NOTE — Progress Notes (Signed)
6 Days Post-Op Procedure(s) (LRB): STERNAL REWIRING (N/A) Subjective: Feels well. Bowels working. Walking. Chest wall pain under control.  Objective: Vital signs in last 24 hours: Temp:  [98.5 F (36.9 C)] 98.5 F (36.9 C) (05/19 1630) Pulse Rate:  [65-102] 68 (05/20 0400) Cardiac Rhythm: Normal sinus rhythm (05/19 2000) Resp:  [11-32] 19 (05/20 0700) BP: (92-129)/(44-93) 114/61 (05/20 0700) SpO2:  [92 %-100 %] 100 % (05/20 0400) Weight:  [143.8 kg] 143.8 kg (05/20 0500)  Hemodynamic parameters for last 24 hours:    Intake/Output from previous day: 05/19 0701 - 05/20 0700 In: 443.4 [P.O.:360; I.V.:83.4] Out: 3136 [Urine:2750; Chest Tube:386] Intake/Output this shift: No intake/output data recorded.  General appearance: alert and cooperative Neurologic: intact Heart: regular rate and rhythm, S1, S2 normal, no murmur, click, rub or gallop Lungs: clear to auscultation bilaterally Extremities: edema moderate in legs Wound: chest incision healing well. Sternum feels stable  Lab Results: Recent Labs    01/12/23 0559 01/13/23 0110  WBC 7.5 7.5  HGB 10.1* 9.7*  HCT 32.6* 30.4*  PLT 371 367   BMET:  Recent Labs    01/12/23 0559 01/13/23 0110  NA 131* 128*  K 3.6 3.7  CL 94* 94*  CO2 28 26  GLUCOSE 162* 118*  BUN 21 20  CREATININE 1.11 1.14  CALCIUM 8.0* 7.7*    PT/INR: No results for input(s): "LABPROT", "INR" in the last 72 hours. ABG    Component Value Date/Time   PHART 7.399 01/08/2023 0830   HCO3 28.2 (H) 01/08/2023 0830   TCO2 30 01/08/2023 0830   ACIDBASEDEF 1.0 01/07/2023 1936   O2SAT 97 01/08/2023 0830   CBG (last 3)  Recent Labs    01/12/23 1601 01/12/23 2142 01/13/23 0644  GLUCAP 109* 119* 135*   CXR: stable.  Assessment/Plan:  POD 11 CABG POD 6 Sternal rewiring.  Hemodynamics stable. Continue Lopressor.  Postop atrial fib: maintaining sinus on amio and Lopressor.  Volume excess: wt is only about 3 lbs over preop but still has  significant lower extremity edema. Will start Demadex 40 daily for a few days. Replace K+  DC chest tubes today.  Continue sternal precautions.  Transfer to 4E and continue IS, ambulation.  LOS: 11 days    [name] 01/13/2023

## 2023-01-13 NOTE — Progress Notes (Addendum)
NAME:  Bryan Brock, MRN:  [id number], DOB:  23-Feb-1956, LOS: 11 ADMISSION DATE:  01/02/2023, CONSULTATION DATE:  01/07/2023 REFERRING MD:  Dr. Laneta Simmers, CHIEF COMPLAINT:  SOB   History of Present Illness:   67 year old male with PMH as below significant for smokeless tobacco products, obesity, HTN, OSA, afib with RVR, mitral valve prolapse, HLD, GERD, ? Asthma, pre-diabetes who was admitted and underwent CABG x 3 on 5/9 with Dr. Laneta Simmers.  Patient with one year of progressive dyspnea, exertional fatigue, and lower extremity swelling found to significant three vessel disease CAD.  Patient found not to be using his pillow while coughing who developed worsening chest pain found to have sternal dehiscence on CT with two inferior wires pulled through sternum.  Went to OR 5/14 for sternal rewiring.  Noted some desaturations with induction, easy intubation and desaturates easily.  To rest overnight on mechanical ventilation, PCCM consulted to assist in vent and medical management while in ICU.   Pertinent  Medical History   Past Medical History:  Diagnosis Date   Acne    ADHD (attention deficit hyperactivity disorder)    Arthritis    Asthma    Bipolar 1 disorder (HCC)    Cancer (HCC)    basal cell cancer removed from back   Cataract    Coronary artery disease    Depression    Dyspnea    Dysrhythmia    PVCs   GERD (gastroesophageal reflux disease)    Heart murmur    Hepatitis    remote hx Hepatitis A (caused by food contaminant in childhood)   History of hiatal hernia    Hyperlipidemia    Hypertension    Mitral valve prolapse    Panic attacks    Peripheral vascular disease (HCC)    PFO (patent foramen ovale)    ? small PFO per echo   PONV (postoperative nausea and vomiting)    Pre-diabetes    Sleep apnea    Significant Hospital Events: Including procedures, antibiotic start and stop dates in addition to other pertinent events   To OR 5/14 for sternal re-wiring  5/15 extubated 5/20  Ambulating well in the halls.   Interim History / Subjective:  Former RT Doing much better Ambulating well Working with IS Good chest pillow compliance Pain better controlled 9L negative  Objective   Blood pressure 114/61, pulse 68, temperature 98.5 F (36.9 C), temperature source Oral, resp. rate 19, height 5\' 11"  (1.803 m), weight (!) 143.8 kg, SpO2 100 %.        Intake/Output Summary (Last 24 hours) at 01/13/2023 0726 Last data filed at 01/13/2023 4098 Gross per 24 hour  Intake 443.44 ml  Output 3136 ml  Net -2692.56 ml    Filed Weights   01/11/23 0500 01/12/23 0500 01/13/23 0500  Weight: (!) 136.6 kg (!) 144.9 kg (!) 143.8 kg    Examination:  General: overweight male in NAD seated in bedside chair HEENT [place]/AT, PERRL, unable to appreciate JVD Pulmonary: Diminished bases R>L Cardiac: RRR, no MRG Abdomen: Soft normoactive Extremities: Warm dry no significant edema.  Neuro: alert, oriented, non-focal  Na 128, K 3.7, BUN 26, Creat 1.14, Hgb 9.7 CXR stable  Resolved Hospital Problem list    Assessment & Plan:   Severe CAD s/p CABG x 4 on 01/02/23 Sternal dehiscence s/p sternal rewiring  Afib with RVR HTN HLD Plan Chest tube management per thoracic surgery  Aspirin and Crestor  Amiodarone PO Multimodal pain control. Lidocaine  patch, gabapentin, opioid, ultram.   Acute hypoxic respiratory failure  OSA Pleural effusions Hypoventilation 2/2 splinting.  ?hx asthma  does not appear to see pulmonologist, no prior PFTs, extubated on 5/15 - Wean supplemental oxygen as able for sat goal > 92%. Now on 1L - Incentive spirometry - Chest pillow compliance - Mobilize - he is doing well with this.   AKI - improved Hyponatremia - Consider holding diuretics today. 9 L negative - Trend BMP   Expected post op ABLA, thrombocytopenia - Monitor   Pre-diabetes w/ hyperglycemia  - Sliding scale insulin  Bipolar Anxiety - lamictal  PCCM will sign off  Best  Practice (right click and "Reselect all SmartList Selections" daily)   Diet/type: Regular consistency (see orders) DVT prophylaxis: SCD GI prophylaxis: H2B Lines: Central line Foley:  Yes, and it is still needed Code Status:  full code Last date of multidisciplinary goals of care discussion [per primary]   Joneen Roach, AGACNP-BC [facility]  See Amion for personal pager PCCM on call pager [phone] until 7pm. Please call Elink 7p-7a. [phone]  01/13/2023 7:32 AM

## 2023-01-13 NOTE — Progress Notes (Signed)
Physical Therapy Treatment Patient Details Name: Bryan Brock MRN: [id number] DOB: February 27, 1956 Today's Date: 01/13/2023   History of Present Illness 67 yo male admitted 5/9 for CABG x 4. 5/14 sternal rewiring due to dehiscence coughing. PMhx: HTN, HLD, bipolar d/o, obesity, sleep apnea, mitral valve prolapse, GERD    PT Comments    Pt very pleasant with increased activity tolerance and improved use of RW. Pt able to state 3/4 precautions and continues to need cues for appropriate UB positioning with activity. Pt educated for repeated sit to stand transfers and gait progression. Pt appreciative and in bed for chest tube removal end of session. Will continue to follow.   HR 78-86 SpO2 92-97% on 1L BP pre gait 120/61 Post gait 126/104   Recommendations for follow up therapy are one component of a multi-disciplinary discharge planning process, led by the attending physician.  Recommendations may be updated based on patient status, additional functional criteria and insurance authorization.  Follow Up Recommendations       Assistance Recommended at Discharge Intermittent Supervision/Assistance  Patient can return home with the following A little help with walking and/or transfers;A little help with bathing/dressing/bathroom;Assistance with cooking/housework;Assist for transportation;Help with stairs or ramp for entrance   Equipment Recommendations  Rolling walker (2 wheels)    Recommendations for Other Services       Precautions / Restrictions Precautions Precautions: Sternal;Fall Precaution Comments: chest tube, watch SPO2 Restrictions Weight Bearing Restrictions: Yes RUE Weight Bearing: Non weight bearing LUE Weight Bearing: Non weight bearing     Mobility  Bed Mobility Overal bed mobility: Needs Assistance Bed Mobility: Sit to Supine       Sit to supine: Min assist   General bed mobility comments: assist for lifting legs to surface, cues for sequence and to maintain  precautions    Transfers Overall transfer level: Needs assistance     Sit to Stand: Min guard           General transfer comment: cues for hand placement, precautions and keeping elbows in from chair and BSC    Ambulation/Gait Ambulation/Gait assistance: Min assist Gait Distance (Feet): 300 Feet Assistive device: Rolling walker (2 wheels) Gait Pattern/deviations: Step-through pattern, Decreased stride length   Gait velocity interpretation: <1.8 ft/sec, indicate of risk for recurrent falls   General Gait Details: min cues for posture and proximity to RW. 4 brief standing rest breaks, chair follow but not required next attempt   Stairs             Wheelchair Mobility    Modified Rankin (Stroke Patients Only)       Balance Overall balance assessment: Needs assistance   Sitting balance-Leahy Scale: Good Sitting balance - Comments: EOB without support   Standing balance support: Bilateral upper extremity supported Standing balance-Leahy Scale: Poor Standing balance comment: RW in standing                            Cognition Arousal/Alertness: Awake/alert Behavior During Therapy: WFL for tasks assessed/performed Overall Cognitive Status: Within Functional Limits for tasks assessed                                          Exercises      General Comments        Pertinent Vitals/Pain Pain Assessment Pain Assessment: 0-10 Pain Score: 3  Pain Location: sternal incision Pain Descriptors / Indicators: Aching Pain Intervention(s): Limited activity within patient's tolerance, Monitored during session    Home Living                          Prior Function            PT Goals (current goals can now be found in the care plan section) Progress towards PT goals: Progressing toward goals    Frequency    Min 1X/week      PT Plan Current plan remains appropriate    Co-evaluation              AM-PAC  PT "6 Clicks" Mobility   Outcome Measure  Help needed turning from your back to your side while in a flat bed without using bedrails?: A Little Help needed moving from lying on your back to sitting on the side of a flat bed without using bedrails?: A Little Help needed moving to and from a bed to a chair (including a wheelchair)?: A Little Help needed standing up from a chair using your arms (e.g., wheelchair or bedside chair)?: A Little Help needed to walk in hospital room?: A Little Help needed climbing 3-5 steps with a railing? : A Lot 6 Click Score: 17    End of Session Equipment Utilized During Treatment: Oxygen Activity Tolerance: Patient tolerated treatment well Patient left: in bed;with call bell/phone within reach Nurse Communication: Mobility status;Precautions PT Visit Diagnosis: Other abnormalities of gait and mobility (R26.89);Difficulty in walking, not elsewhere classified (R26.2)     Time: 2956-2130 PT Time Calculation (min) (ACUTE ONLY): 32 min  Charges:  $Gait Training: 8-22 mins $Therapeutic Activity: 8-22 mins                     Merryl Hacker, PT Acute Rehabilitation Services Office: [phone]    Cristine Polio 01/13/2023, 9:48 AM

## 2023-01-14 ENCOUNTER — Inpatient Hospital Stay (HOSPITAL_COMMUNITY): Payer: Medicare Other

## 2023-01-14 LAB — CBC
HCT: 36.5 % — ABNORMAL LOW (ref 39.0–52.0)
Hemoglobin: 11 g/dL — ABNORMAL LOW (ref 13.0–17.0)
MCH: 30.5 pg (ref 26.0–34.0)
MCHC: 30.1 g/dL (ref 30.0–36.0)
MCV: 101.1 fL — ABNORMAL HIGH (ref 80.0–100.0)
Platelets: 415 10*3/uL — ABNORMAL HIGH (ref 150–400)
RBC: 3.61 MIL/uL — ABNORMAL LOW (ref 4.22–5.81)
RDW: 13.8 % (ref 11.5–15.5)
WBC: 10.9 10*3/uL — ABNORMAL HIGH (ref 4.0–10.5)
nRBC: 0.2 % (ref 0.0–0.2)

## 2023-01-14 LAB — GLUCOSE, CAPILLARY
Glucose-Capillary: 121 mg/dL — ABNORMAL HIGH (ref 70–99)
Glucose-Capillary: 180 mg/dL — ABNORMAL HIGH (ref 70–99)
Glucose-Capillary: 127 mg/dL — ABNORMAL HIGH (ref 70–99)
Glucose-Capillary: 167 mg/dL — ABNORMAL HIGH (ref 70–99)

## 2023-01-14 LAB — BASIC METABOLIC PANEL
Anion gap: 10 (ref 5–15)
BUN: 20 mg/dL (ref 8–23)
CO2: 27 mmol/L (ref 22–32)
Calcium: 8 mg/dL — ABNORMAL LOW (ref 8.9–10.3)
Chloride: 92 mmol/L — ABNORMAL LOW (ref 98–111)
Creatinine, Ser: 1.22 mg/dL (ref 0.61–1.24)
GFR, Estimated: >60 mL/min (ref >60)
Glucose, Bld: 140 mg/dL — ABNORMAL HIGH (ref 70–99)
Potassium: 4.3 mmol/L (ref 3.5–5.1)
Sodium: 129 mmol/L — ABNORMAL LOW (ref 135–145)

## 2023-01-14 LAB — TYPE AND SCREEN
ABO/RH(D): O POS
Antibody Screen: NEGATIVE
Unit division: 0
Unit division: 0

## 2023-01-14 LAB — BPAM RBC
Blood Product Expiration Date: 202406122359
Blood Product Expiration Date: 202406122359
Unit Type and Rh: 5100
Unit Type and Rh: 5100

## 2023-01-14 NOTE — Progress Notes (Signed)
Occupational Therapy Treatment Patient Details Name: Bryan Brock MRN: [id number] DOB: 06-02-1956 Today's Date: 01/14/2023   History of present illness 67 yo male admitted 5/9 for CABG x 4. 5/14 sternal rewiring due to dehiscence coughing. PMhx: HTN, HLD, bipolar d/o, obesity, sleep apnea, mitral valve prolapse, GERD   OT comments  Patient seated on EOB stating he woke up feeling stronger but after sitting on EOB he began feeling more shortness of breath and fatigue. Patient able to ambulate to sink for grooming but unable to stand for self care tasks and required frequent rest breaks following all tasks. Patient returned to EOB and required assistance to get to supine and with positioning in bed. At end of session in supine 102/92, HR 89, and SpO2 93 on 1 liter. Patient to continues to be followed by acute OT with discharge recommendations continue to be appropriate.    Recommendations for follow up therapy are one component of a multi-disciplinary discharge planning process, led by the attending physician.  Recommendations may be updated based on patient status, additional functional criteria and insurance authorization.    Assistance Recommended at Discharge Intermittent Supervision/Assistance  Patient can return home with the following  Assist for transportation;Assistance with cooking/housework;A lot of help with bathing/dressing/bathroom;A little help with walking and/or transfers   Equipment Recommendations  None recommended by OT    Recommendations for Other Services      Precautions / Restrictions Precautions Precautions: Sternal;Fall Precaution Booklet Issued: No Precaution Comments: chest tube, watch SPO2 Restrictions Weight Bearing Restrictions: Yes RUE Weight Bearing: Non weight bearing LUE Weight Bearing: Non weight bearing Other Position/Activity Restrictions: sternal       Mobility Bed Mobility Overal bed mobility: Needs Assistance Bed Mobility: Sit to Supine        Sit to supine: Min assist   General bed mobility comments: assistance with lifting legs into bed    Transfers Overall transfer level: Needs assistance Equipment used: Rolling walker (2 wheels) Transfers: Sit to/from Stand, Bed to chair/wheelchair/BSC Sit to Stand: Min assist     Step pivot transfers: Min assist     General transfer comment: cues for hand placement and body mechanics     Balance Overall balance assessment: Needs assistance Sitting-balance support: No upper extremity supported, Feet supported Sitting balance-Leahy Scale: Good Sitting balance - Comments: seated on EOB upon entry   Standing balance support: Bilateral upper extremity supported Standing balance-Leahy Scale: Poor Standing balance comment: RW in standing                           ADL either performed or assessed with clinical judgement   ADL Overall ADL's : Needs assistance/impaired     Grooming: Wash/dry hands;Wash/dry face;Oral care;Set up;Supervision/safety;Sitting Grooming Details (indicate cue type and reason): frequent rest breaks, too fatigued to attempt while standing                               General ADL Comments: limited by fatigue shortness of breath    Extremity/Trunk Assessment              Vision       Perception     Praxis      Cognition Arousal/Alertness: Awake/alert Behavior During Therapy: WFL for tasks assessed/performed Overall Cognitive Status: Within Functional Limits for tasks assessed  General Comments: able to recall precautions        Exercises      Shoulder Instructions       General Comments cues for pursed lip breathing, frequent rest breaks due to shortness of breath and fatigue    Pertinent Vitals/ Pain       Pain Assessment Pain Assessment: Faces Faces Pain Scale: Hurts little more Pain Location: sternal incision Pain Descriptors / Indicators:  Aching Pain Intervention(s): Limited activity within patient's tolerance, Monitored during session, Repositioned  Home Living                                          Prior Functioning/Environment              Frequency  Min 1X/week        Progress Toward Goals  OT Goals(current goals can now be found in the care plan section)  Progress towards OT goals: Progressing toward goals  Acute Rehab OT Goals Patient Stated Goal: go home OT Goal Formulation: With patient Time For Goal Achievement: 01/24/23 Potential to Achieve Goals: Good ADL Goals Pt Will Perform Grooming: with supervision;standing Pt Will Perform Upper Body Dressing: with supervision;sitting Pt Will Perform Lower Body Dressing: with min assist;sit to/from stand Pt Will Transfer to Toilet: with supervision;ambulating;regular height toilet Pt/caregiver will Perform Home Exercise Program: Increased ROM;Both right and left upper extremity  Plan Discharge plan remains appropriate    Co-evaluation                 AM-PAC OT "6 Clicks" Daily Activity     Outcome Measure   Help from another person eating meals?: None Help from another person taking care of personal grooming?: None Help from another person toileting, which includes using toliet, bedpan, or urinal?: A Lot Help from another person bathing (including washing, rinsing, drying)?: A Lot Help from another person to put on and taking off regular upper body clothing?: A Lot Help from another person to put on and taking off regular lower body clothing?: A Lot 6 Click Score: 16    End of Session Equipment Utilized During Treatment: Rolling walker (2 wheels);Oxygen  OT Visit Diagnosis: Unsteadiness on feet (R26.81);Muscle weakness (generalized) (M62.81)   Activity Tolerance Patient limited by fatigue   Patient Left in bed;with call bell/phone within reach;with bed alarm set   Nurse Communication Mobility status;Other (comment)  (level of activity tolerance)        Time: 9147-8295 OT Time Calculation (min): 58 min  Charges: OT General Charges $OT Visit: 1 Visit OT Treatments $Self Care/Home Management : 38-52 mins $Therapeutic Activity: 8-22 mins  Alfonse Flavors, OTA Acute Rehabilitation Services  Office [phone]   Dewain Penning 01/14/2023, 8:56 AM

## 2023-01-14 NOTE — Progress Notes (Signed)
CARDIAC REHAB PHASE I   Pt in bed, eating lunch. Pt reports SOB at rest off and on today. Making mobility challenging per pt. Reports working with OT was not to bad but tired afterwards.  Encouraged increased mobility and IS for improved pulmonary function. Pt also asked about home health care and CIR. Will ask case management to see pt. Will continue to follow.   [phone] Woodroe Chen, RN BSN 01/14/2023 2:40 PM

## 2023-01-14 NOTE — Progress Notes (Addendum)
[address]       [place] [address]             [phone]     7 Days Post-Op Procedure(s) (LRB): STERNAL REWIRING (N/A) Subjective: Nursing reports had a lot of SOB last night which has gradually improved over time  Objective: Vital signs in last 24 hours: Temp:  [97.9 F (36.6 C)-98.6 F (37 C)] 98.6 F (37 C) (05/21 0728) Pulse Rate:  [66-82] 82 (05/21 0728) Cardiac Rhythm: Normal sinus rhythm (05/20 1925) Resp:  [18-28] 21 (05/21 0728) BP: (108-143)/(60-77) 138/68 (05/21 0728) SpO2:  [93 %-100 %] 97 % (05/21 0728)  Hemodynamic parameters for last 24 hours:    Intake/Output from previous day: 05/20 0701 - 05/21 0700 In: 960 [P.O.:960] Out: 4300 [Urine:4250; Chest Tube:50] Intake/Output this shift: No intake/output data recorded.  General appearance: alert, cooperative, fatigued, and no distress Heart: regular rate and rhythm Lungs: dim left lower fields Abdomen: soft, non tender, obese Extremities: pitting edema BLE Wound: some drainage lower pole of sternal incis  Lab Results: Recent Labs    01/13/23 0110 01/14/23 0147  WBC 7.5 10.9*  HGB 9.7* 11.0*  HCT 30.4* 36.5*  PLT 367 415*   BMET:  Recent Labs    01/13/23 0110 01/14/23 0358  NA 128* 129*  K 3.7 4.3  CL 94* 92*  CO2 26 27  GLUCOSE 118* 140*  BUN 20 20  CREATININE 1.14 1.22  CALCIUM 7.7* 8.0*    PT/INR: No results for input(s): "LABPROT", "INR" in the last 72 hours. ABG    Component Value Date/Time   PHART 7.399 01/08/2023 0830   HCO3 28.2 (H) 01/08/2023 0830   TCO2 30 01/08/2023 0830   ACIDBASEDEF 1.0 01/07/2023 1936   O2SAT 97 01/08/2023 0830   CBG (last 3)  Recent Labs    01/13/23 1612 01/13/23 2108 01/14/23 0551  GLUCAP 135* 117* 121*    Meds Scheduled Meds:  amiodarone  400 mg Oral BID   aspirin EC  325 mg Oral Daily   chlorpheniramine-HYDROcodone  5 mL Oral Q12H   docusate sodium  200 mg Oral Daily   gabapentin  200 mg Oral BID    guaiFENesin  1,200 mg Oral BID   insulin aspart  0-24 Units Subcutaneous TID AC & HS   lamoTRIgine  100 mg Oral Daily   lidocaine  2 patch Transdermal Q24H   metoprolol tartrate  37.5 mg Oral BID   nicotine  21 mg Transdermal Daily   potassium chloride  20 mEq Oral BID   rosuvastatin  10 mg Oral Daily   torsemide  40 mg Oral Daily   Continuous Infusions: PRN Meds:.ALPRAZolam, calcium carbonate, ipratropium-albuterol, Muscle Rub, ondansetron (ZOFRAN) IV, oxyCODONE, traMADol  Xrays DG Chest Port 1 View  Result Date: 01/13/2023 CLINICAL DATA:  Status post coronary bypass surgery EXAM: PORTABLE CHEST 1 VIEW COMPARISON:  Previous studies including the examination of 01/12/2023 FINDINGS: Transverse diameter of heart is increased. There is evidence of coronary artery bypass surgery. Bilateral chest tubes are seen. There is blunting of left lateral CP angle. Linear densities are seen in left mid and both lower lung fields with no significant interval change. There is relative sparing of right upper and right mid lung fields. Right lateral CP angle is clear. There is no pneumothorax. There is interval removal of right IJ central venous catheter. IMPRESSION: Cardiomegaly. Linear densities in left mid and both lower lung fields may suggest subsegmental  atelectasis. Possible small left pleural effusion. Electronically Signed   By: Ernie Avena M.D.   On: 01/13/2023 10:10    Assessment/Plan: S/P Procedure(s) (LRB): STERNAL REWIRING (N/A) POD 12/7 CABG/sternal rewire  1 afeb, VSS, sinus rhythm 2 O2 sats ok on 1 liter 3 good UOP- not weighed yet 4 CT now out 5 BS controlled 6 hyponatremia - stable with sodium 129 7 normal renal fxn, not on diuretic, K+ 4.3, on demadex and potassium 8 WBC slightly up - 10.9 9 H/H improved 10 mild thrombocytosis, plt ct 415k- monitor, likely reactive 11 CXR some increase in Left ASD- may be mostly technique 12 cont rehab/pulm gygiene     LOS: 12 days     [name] PA-C Pager [phone] 01/14/2023   Chart reviewed, patient examined, agree with above. He has had some serosanguinous drainage from the lower incision overnight. He had some SOB and cough last night which is better this am. CXR this am shows a small right ptx which may be some air sucked in through the chest tube site when it was removed. He never had any air leak. There is still density over the left lower chest which is partly chest wall from the dislocated costal cartilage/rib junctions and hematoma in the chest wall from that as well as LLL atelectasis and may be some effusion. The sternal wires look to be in the same position. Sternum feels grossly stable but certainly at high risk of disruption/non-healing of lower sternum. Will continue observation today and change chest dressing as needed.

## 2023-01-15 ENCOUNTER — Inpatient Hospital Stay (HOSPITAL_COMMUNITY): Payer: Medicare Other

## 2023-01-15 LAB — CBC
HCT: 30.7 % — ABNORMAL LOW (ref 39.0–52.0)
Hemoglobin: 10 g/dL — ABNORMAL LOW (ref 13.0–17.0)
MCH: 30.7 pg (ref 26.0–34.0)
MCHC: 32.6 g/dL (ref 30.0–36.0)
MCV: 94.2 fL (ref 80.0–100.0)
Platelets: 461 10*3/uL — ABNORMAL HIGH (ref 150–400)
RBC: 3.26 MIL/uL — ABNORMAL LOW (ref 4.22–5.81)
RDW: 13.7 % (ref 11.5–15.5)
WBC: 11.4 10*3/uL — ABNORMAL HIGH (ref 4.0–10.5)
nRBC: 0 % (ref 0.0–0.2)

## 2023-01-15 LAB — BASIC METABOLIC PANEL
Anion gap: 8 (ref 5–15)
BUN: 20 mg/dL (ref 8–23)
CO2: 28 mmol/L (ref 22–32)
Calcium: 7.9 mg/dL — ABNORMAL LOW (ref 8.9–10.3)
Chloride: 91 mmol/L — ABNORMAL LOW (ref 98–111)
Creatinine, Ser: 1.32 mg/dL — ABNORMAL HIGH (ref 0.61–1.24)
GFR, Estimated: 59 mL/min — ABNORMAL LOW (ref >60)
Glucose, Bld: 116 mg/dL — ABNORMAL HIGH (ref 70–99)
Potassium: 3.8 mmol/L (ref 3.5–5.1)
Sodium: 127 mmol/L — ABNORMAL LOW (ref 135–145)

## 2023-01-15 LAB — GLUCOSE, CAPILLARY
Glucose-Capillary: 96 mg/dL (ref 70–99)
Glucose-Capillary: 118 mg/dL — ABNORMAL HIGH (ref 70–99)
Glucose-Capillary: 147 mg/dL — ABNORMAL HIGH (ref 70–99)
Glucose-Capillary: 145 mg/dL — ABNORMAL HIGH (ref 70–99)

## 2023-01-15 MED FILL — Sodium Bicarbonate IV Soln 8.4%: INTRAVENOUS | Qty: 50 | Status: AC

## 2023-01-15 MED FILL — Lidocaine HCl Local Soln Prefilled Syringe 100 MG/5ML (2%): INTRAMUSCULAR | Qty: 5 | Status: AC

## 2023-01-15 MED FILL — Heparin Sodium (Porcine) Inj 1000 Unit/ML: INTRAMUSCULAR | Qty: 10 | Status: AC

## 2023-01-15 MED FILL — Mannitol IV Soln 20%: INTRAVENOUS | Qty: 500 | Status: AC

## 2023-01-15 MED FILL — Electrolyte-R (PH 7.4) Solution: INTRAVENOUS | Qty: 5000 | Status: AC

## 2023-01-15 MED FILL — Sodium Chloride IV Soln 0.9%: INTRAVENOUS | Qty: 2000 | Status: AC

## 2023-01-15 NOTE — Plan of Care (Signed)
  Problem: Education: Goal: Knowledge of General Education information will improve Description: Including pain rating scale, medication(s)/side effects and non-pharmacologic comfort measures Outcome: Progressing   Problem: Health Behavior/Discharge Planning: Goal: Ability to manage health-related needs will improve Outcome: Progressing   Problem: Clinical Measurements: Goal: Ability to maintain clinical measurements within normal limits will improve Outcome: Progressing Goal: Will remain free from infection Outcome: Progressing Goal: Respiratory complications will improve Outcome: Progressing Goal: Cardiovascular complication will be avoided Outcome: Progressing   Problem: Activity: Goal: Risk for activity intolerance will decrease Outcome: Progressing   Problem: Elimination: Goal: Will not experience complications related to bowel motility Outcome: Progressing   Problem: Pain Managment: Goal: General experience of comfort will improve Outcome: Progressing   Problem: Safety: Goal: Ability to remain free from injury will improve Outcome: Progressing   Problem: Skin Integrity: Goal: Risk for impaired skin integrity will decrease Outcome: Progressing   Problem: Activity: Goal: Risk for activity intolerance will decrease Outcome: Progressing   Problem: Respiratory: Goal: Respiratory status will improve Outcome: Progressing   Problem: Skin Integrity: Goal: Wound healing without signs and symptoms of infection Outcome: Progressing

## 2023-01-15 NOTE — Progress Notes (Addendum)
Mobility Specialist Progress Note:    01/15/23 1500  Mobility  Activity Transferred from chair to bed  Level of Assistance Minimal assist, patient does 75% or more  Assistive Device None  Activity Response Tolerated well  Mobility Referral Yes  $Mobility charge 1 Mobility  Mobility Specialist Start Time (ACUTE ONLY) 1520  Mobility Specialist Stop Time (ACUTE ONLY) 1530  Mobility Specialist Time Calculation (min) (ACUTE ONLY) 10 min   Pt received in chair, assisted back to bed per request. Pt requiring Contact Guard to stand w/ minA and max verbal cues for direction. Pt required minA for bed mobility. Pt w/ call bell at side.    Thompson Grayer Mobility Specialist  Please contact vis Secure Chat or  Rehab Office [phone]

## 2023-01-15 NOTE — Progress Notes (Signed)
8 Days Post-Op Procedure(s) (LRB): STERNAL REWIRING (N/A) Subjective: Pain stable. Had some desaturation overnight without oxygen.  Objective: Vital signs in last 24 hours: Temp:  [97.6 F (36.4 C)-99.3 F (37.4 C)] 97.7 F (36.5 C) (05/22 0727) Pulse Rate:  [71-95] 83 (05/22 0727) Cardiac Rhythm: Normal sinus rhythm (05/21 1922) Resp:  [20-24] 20 (05/22 0727) BP: (115-137)/(60-98) 137/70 (05/22 0727) SpO2:  [94 %-100 %] 100 % (05/22 0727) Weight:  [142.4 kg] 142.4 kg (05/22 0612)  Hemodynamic parameters for last 24 hours:    Intake/Output from previous day: 05/21 0701 - 05/22 0700 In: 960 [P.O.:960] Out: 950 [Urine:950] Intake/Output this shift: No intake/output data recorded.  General appearance: alert and cooperative Neurologic: intact Heart: regular rate and rhythm, S1, S2 normal, no murmur, click, rub or gallop Lungs: diminished breath sounds bibasilar Extremities: edema mild Wound: chest incision intact with some serosanguinous drainage. No erythema. Chest wall unstable on left due to dislocation of costal cartilages from ribs.   Lab Results: Recent Labs    01/14/23 0147 01/15/23 0142  WBC 10.9* 11.4*  HGB 11.0* 10.0*  HCT 36.5* 30.7*  PLT 415* 461*   BMET:  Recent Labs    01/14/23 0358 01/15/23 0142  NA 129* 127*  K 4.3 3.8  CL 92* 91*  CO2 27 28  GLUCOSE 140* 116*  BUN 20 20  CREATININE 1.22 1.32*  CALCIUM 8.0* 7.9*    PT/INR: No results for input(s): "LABPROT", "INR" in the last 72 hours. ABG    Component Value Date/Time   PHART 7.399 01/08/2023 0830   HCO3 28.2 (H) 01/08/2023 0830   TCO2 30 01/08/2023 0830   ACIDBASEDEF 1.0 01/07/2023 1936   O2SAT 97 01/08/2023 0830   CBG (last 3)  Recent Labs    01/14/23 1551 01/14/23 2122 01/15/23 0608  GLUCAP 127* 167* 96   CXR: small right ptx unchanged. Left pleural effusion and mid to lower lung atelectasis unchanged.  Assessment/Plan:  POD 13 CABG POD 8 sternal  rewiring.  Hemodynamics stable on Lopressor. No further atrial fib on amio and Lopressor.  Wt is down to preop at 314. Slight bump in creat. Will dc diuretic for now.  With drainage from sternal incision and some instability will get chest CT this am to assess.  LOS: 13 days    [name] 01/15/2023

## 2023-01-15 NOTE — Progress Notes (Signed)
CARDIAC REHAB PHASE I   PRE:  Rate/Rhythm: 81 SR    BP: sitting 99/67    SpO2: 97 1/2L  MODE:  Ambulation: 320 ft   POST:  Rate/Rhythm: 102 ST    BP: sitting 142/72     SpO2: 96 2L  Pt motivated to ambulate. Needed strong verbal cues and mod assist to roll to his side first before dropping feet when moving from supine to EOB. With practice he should do this better with less reliance on staff/family. Moved hips forward and stood independently. Used gait belt for safety, 2L for support of PTX, and Carley Hammed. Steady walking, needed standing rests every 40-50 ft due to SOB. SpO2 stable. To recliner, left feet down for now. Encouraged IS, 1000 ml currently. Will ask Mobility Team to help with ambulation/mobility. [phone]  Ethelda Chick BS, ACSM-CEP 01/15/2023 1:11 PM

## 2023-01-16 LAB — BASIC METABOLIC PANEL
Anion gap: 11 (ref 5–15)
BUN: 17 mg/dL (ref 8–23)
CO2: 27 mmol/L (ref 22–32)
Calcium: 8.3 mg/dL — ABNORMAL LOW (ref 8.9–10.3)
Chloride: 91 mmol/L — ABNORMAL LOW (ref 98–111)
Creatinine, Ser: 1.14 mg/dL (ref 0.61–1.24)
GFR, Estimated: >60 mL/min (ref >60)
Glucose, Bld: 128 mg/dL — ABNORMAL HIGH (ref 70–99)
Potassium: 3.8 mmol/L (ref 3.5–5.1)
Sodium: 129 mmol/L — ABNORMAL LOW (ref 135–145)

## 2023-01-16 LAB — CBC
HCT: 30.8 % — ABNORMAL LOW (ref 39.0–52.0)
Hemoglobin: 9.9 g/dL — ABNORMAL LOW (ref 13.0–17.0)
MCH: 30.1 pg (ref 26.0–34.0)
MCHC: 32.1 g/dL (ref 30.0–36.0)
MCV: 93.6 fL (ref 80.0–100.0)
Platelets: 496 10*3/uL — ABNORMAL HIGH (ref 150–400)
RBC: 3.29 MIL/uL — ABNORMAL LOW (ref 4.22–5.81)
RDW: 14.1 % (ref 11.5–15.5)
WBC: 8.9 10*3/uL (ref 4.0–10.5)
nRBC: 0 % (ref 0.0–0.2)

## 2023-01-16 LAB — GLUCOSE, CAPILLARY
Glucose-Capillary: 103 mg/dL — ABNORMAL HIGH (ref 70–99)
Glucose-Capillary: 165 mg/dL — ABNORMAL HIGH (ref 70–99)
Glucose-Capillary: 113 mg/dL — ABNORMAL HIGH (ref 70–99)
Glucose-Capillary: 125 mg/dL — ABNORMAL HIGH (ref 70–99)

## 2023-01-16 MED ORDER — FUROSEMIDE 40 MG PO TABS
40.0000 mg | ORAL_TABLET | Freq: Every day | ORAL | Status: DC
  Administered 2023-01-16 – 2023-01-18 (×3): 40 mg via ORAL
  Filled 2023-01-16 (×3): qty 1

## 2023-01-16 MED ORDER — CEPHALEXIN 500 MG PO CAPS
500.0000 mg | ORAL_CAPSULE | Freq: Three times a day (TID) | ORAL | Status: DC
  Administered 2023-01-16 – 2023-01-19 (×12): 500 mg via ORAL
  Filled 2023-01-16 (×13): qty 1

## 2023-01-16 MED ORDER — POTASSIUM CHLORIDE CRYS ER 20 MEQ PO TBCR
20.0000 meq | EXTENDED_RELEASE_TABLET | Freq: Every day | ORAL | Status: DC
  Administered 2023-01-17 – 2023-01-18 (×2): 20 meq via ORAL
  Filled 2023-01-16 (×2): qty 1

## 2023-01-16 MED ORDER — POTASSIUM CHLORIDE CRYS ER 20 MEQ PO TBCR
40.0000 meq | EXTENDED_RELEASE_TABLET | Freq: Once | ORAL | Status: AC
  Administered 2023-01-16: 40 meq via ORAL
  Filled 2023-01-16: qty 2

## 2023-01-16 NOTE — Progress Notes (Addendum)
[address]       [place] [address]             [phone]      9 Days Post-Op Procedure(s) (LRB): STERNAL REWIRING (N/A) Subjective: Patient states he is feeling better this AM.   Objective: Vital signs in last 24 hours: Temp:  [97.7 F (36.5 C)-98.5 F (36.9 C)] 98.5 F (36.9 C) (05/23 0412) Pulse Rate:  [66-90] 68 (05/23 0412) Cardiac Rhythm: Normal sinus rhythm (05/22 2015) Resp:  [19-22] 19 (05/23 0412) BP: (99-137)/(53-70) 125/68 (05/23 0412) SpO2:  [95 %-100 %] 96 % (05/23 0412) Weight:  [143.1 kg] 143.1 kg (05/23 0412)  Hemodynamic parameters for last 24 hours:    Intake/Output from previous day: 05/22 0701 - 05/23 0700 In: 1320 [P.O.:1320] Out: 1700 [Urine:1700] Intake/Output this shift: No intake/output data recorded.  General appearance: alert, cooperative, and no distress Neurologic: intact Heart: regular rate and rhythm, S1, S2 normal, no murmur, click, rub or gallop Lungs: Diminished bibasilar Abdomen: soft, non-tender; bowel sounds normal; no masses,  no organomegaly Extremities: edema 2+, pitting edema Wound: Clean and dry dressing in place, slight instability of inferior left portion of sternum due to dislocated ribs    Lab Results: Recent Labs    01/14/23 0147 01/15/23 0142  WBC 10.9* 11.4*  HGB 11.0* 10.0*  HCT 36.5* 30.7*  PLT 415* 461*   BMET:  Recent Labs    01/14/23 0358 01/15/23 0142  NA 129* 127*  K 4.3 3.8  CL 92* 91*  CO2 27 28  GLUCOSE 140* 116*  BUN 20 20  CREATININE 1.22 1.32*  CALCIUM 8.0* 7.9*    PT/INR: No results for input(s): "LABPROT", "INR" in the last 72 hours. ABG    Component Value Date/Time   PHART 7.399 01/08/2023 0830   HCO3 28.2 (H) 01/08/2023 0830   TCO2 30 01/08/2023 0830   ACIDBASEDEF 1.0 01/07/2023 1936   O2SAT 97 01/08/2023 0830   CBG (last 3)  Recent Labs    01/15/23 1553 01/15/23 2108 01/16/23 0557  GLUCAP 147* 145* 103*    Assessment/Plan: S/P  Procedure(s) (LRB): STERNAL REWIRING (N/A)  CV: NSR, HR 60s-70s. SBP 125. Continue Amiodarone and Lopressor.  Pulm: Saturating well on 1L [place]. CXR shows small left pleural effusion and atelectasis as well as stable right pneumothorax. Encourage IS and ambulation.   GI: +BM 05/20  Endo: CBGs controlled on SSI. New diabetes diagnosis A1C 6.6, will need outpatient follow up with PCP.   Renal: Last Cr 1.32. Held Lasix yesterday. Pitting edema on exam, will give Lasix today.   ID: Leukocytosis WBC 11.4, possibly due to fluid around sternal site vs pleural effusion. Afebrile. On empiric Keflex.  Expected postop ABLA: Last H/H trending up 10/30.7  Sternal dehiscence: S/P sternal rewiring. Chest CT showed displacement of 3rd, 4th and 5th ribs with similar air and fluid levels around the site. Will continue dressing changes for now and monitor.   Deconditioning: Continue work with PT  Dispo: Continue dressing changes and ambulation, will continue to monitor sternal site closely.    LOS: 14 days    Jenny Reichmann, PA-C 01/16/2023   Chart reviewed, patient examined, agree with above. I changed the chest dressing this am and the incision looks ok. Minimal drainage, no erythema. There is excessive motion in the lower sternal and left lower anterior chest wall due to multiple fractures in the left side of lower sternum and dislocation of left  4th and 5th costal cartilage/rib junctions. The chest CT was reviewed and the upper two thirds of the sternum is intact with the lower third held in place by the wire weave and sternal wires. I am not sure if this area will heal but will continue observation over the weekend. Will need to change the chest dressing twice daily and will put on empiric Keflex. There was also a small right ptx from chest tube removal and a small left pleural effusion. These should resolve.

## 2023-01-16 NOTE — Progress Notes (Signed)
Physical Therapy Treatment Patient Details Name: Bryan Brock MRN: [id number] DOB: 1956-04-28 Today's Date: 01/16/2023   History of Present Illness 67 yo male admitted 5/9 for CABG x 4. 5/14 sternal rewiring due to dehiscence coughing. PMhx: HTN, HLD, bipolar d/o, obesity, sleep apnea, mitral valve prolapse, GERD    PT Comments    Pt needing continued cues and instruction for bed mobility, gait and transfers. PT able to maintain SPO2 92-96% on RA throughout session with HR 88-94 however pt stating fatigue, SOB and dizziness limiting gait tolerance and function. Pt needing repeated seated rests with chair pulled to him and extensive seated rest to recover prior to tolerance for return to standing and gait trials. Pt without rails on stair at home and will need to be able to ascend stairs for D/C. Pt also stating possibility of ramp installation prior to D/C. Will continue to work toward improved bed mobility, gait and stairs.   BP 133/67 (82) with gait    Recommendations for follow up therapy are one component of a multi-disciplinary discharge planning process, led by the attending physician.  Recommendations may be updated based on patient status, additional functional criteria and insurance authorization.  Follow Up Recommendations       Assistance Recommended at Discharge Intermittent Supervision/Assistance  Patient can return home with the following A little help with walking and/or transfers;A little help with bathing/dressing/bathroom;Assistance with cooking/housework;Assist for transportation;Help with stairs or ramp for entrance   Equipment Recommendations  Rolling walker (2 wheels)    Recommendations for Other Services       Precautions / Restrictions Precautions Precautions: Sternal;Fall Precaution Comments: watch SPO2     Mobility  Bed Mobility Overal bed mobility: Needs Assistance Bed Mobility: Rolling, Sidelying to Sit Rolling: Min assist Sidelying to sit: Min  assist       General bed mobility comments: cues for sequence to maximize push with LB to roll and use momentum of legs off bed to rise, continues to need min assist to adhere to precautions and rise from flat surface    Transfers Overall transfer level: Needs assistance   Transfers: Sit to/from Stand Sit to Stand: Min guard           General transfer comment: minguard to rise from bed x 1 and chair x 3    Ambulation/Gait Ambulation/Gait assistance: Min guard Gait Distance (Feet): 120 Feet Assistive device: Rolling walker (2 wheels) Gait Pattern/deviations: Step-through pattern, Decreased stride length   Gait velocity interpretation: <1.8 ft/sec, indicate of risk for recurrent falls   General Gait Details: cues for posture and proximity to rW. Pt able to walk to stair well but then reported fatigue needing seated rest despite VSS. Pt then walked 25', 60', 40' with seated rest after each trial limited by fatigue and "feeling faint" despite continued VSS   Stairs             Wheelchair Mobility    Modified Rankin (Stroke Patients Only)       Balance Overall balance assessment: Needs assistance                                          Cognition Arousal/Alertness: Awake/alert Behavior During Therapy: WFL for tasks assessed/performed Overall Cognitive Status: Within Functional Limits for tasks assessed  General Comments: recalling 2/4 precautions with education for all        Exercises      General Comments        Pertinent Vitals/Pain Pain Assessment Pain Score: 3  Pain Location: sternal incision Pain Descriptors / Indicators: Aching, Sore Pain Intervention(s): Limited activity within patient's tolerance, Monitored during session, Repositioned    Home Living                          Prior Function            PT Goals (current goals can now be found in the care plan  section) Progress towards PT goals: Progressing toward goals    Frequency    Min 1X/week      PT Plan Current plan remains appropriate    Co-evaluation              AM-PAC PT "6 Clicks" Mobility   Outcome Measure  Help needed turning from your back to your side while in a flat bed without using bedrails?: A Little Help needed moving from lying on your back to sitting on the side of a flat bed without using bedrails?: A Little Help needed moving to and from a bed to a chair (including a wheelchair)?: A Little Help needed standing up from a chair using your arms (e.g., wheelchair or bedside chair)?: A Little Help needed to walk in hospital room?: A Little Help needed climbing 3-5 steps with a railing? : A Lot 6 Click Score: 17    End of Session   Activity Tolerance: Patient limited by fatigue Patient left: in chair;with call bell/phone within reach Nurse Communication: Mobility status;Precautions PT Visit Diagnosis: Other abnormalities of gait and mobility (R26.89);Difficulty in walking, not elsewhere classified (R26.2)     Time: 1610-9604 PT Time Calculation (min) (ACUTE ONLY): 63 min  Charges:  $Gait Training: 38-52 mins $Therapeutic Activity: 8-22 mins                     Merryl Hacker, PT Acute Rehabilitation Services Office: [phone]    Enedina Finner Thera Basden 01/16/2023, 10:45 AM

## 2023-01-16 NOTE — Progress Notes (Signed)
Occupational Therapy Treatment Patient Details Name: Bryan Brock MRN: [id number] DOB: 05-Jan-1956 Today's Date: 01/16/2023   History of present illness 67 yo male admitted 5/9 for CABG x 4. 5/14 sternal rewiring due to dehiscence coughing. PMhx: HTN, HLD, bipolar d/o, obesity, sleep apnea, mitral valve prolapse, GERD   OT comments  Pt making progress with functional goals, eager and motivated to work with OT. Pt stood form chair adhering to sternal precautions using pillow, walked around bedside to [facility], clothing mgt min A, min guard A to sit on BSC, min A to stand from [facility], total A with posterior hygiene. Pt's on RA upon arrival with O2 SATs 95%. Pt hyperverbose and required constant verbal cues to perform deep pursed lip breathing vs talking due to O2 SATs dropping during activity to 86% then to 70s, 2L O2 placed on pt to help with O2 SATs increasing back to 90s. OT will continue to follow acutely   Recommendations for follow up therapy are one component of a multi-disciplinary discharge planning process, led by the attending physician.  Recommendations may be updated based on patient status, additional functional criteria and insurance authorization.    Assistance Recommended at Discharge Intermittent Supervision/Assistance  Patient can return home with the following  Assist for transportation;Assistance with cooking/housework;A lot of help with bathing/dressing/bathroom;A little help with walking and/or transfers   Equipment Recommendations  None recommended by OT    Recommendations for Other Services      Precautions / Restrictions Precautions Precautions: Sternal;Fall Precaution Booklet Issued: No Precaution Comments: watch SPO2 Restrictions Weight Bearing Restrictions: Yes RUE Weight Bearing: Non weight bearing LUE Weight Bearing: Non weight bearing Other Position/Activity Restrictions: sternal       Mobility Bed Mobility               General bed mobility comments:  pt in recliner upon arrival    Transfers Overall transfer level: Needs assistance Equipment used: Rolling walker (2 wheels) Transfers: Sit to/from Stand Sit to Stand: Min guard, Min assist     Step pivot transfers: Min assist, Min guard     General transfer comment: min guard A to stand from chair, min A to stand from [facility]     Balance Overall balance assessment: Needs assistance Sitting-balance support: No upper extremity supported, Feet supported Sitting balance-Leahy Scale: Good     Standing balance support: Bilateral upper extremity supported, During functional activity Standing balance-Leahy Scale: Poor                             ADL either performed or assessed with clinical judgement   ADL Overall ADL's : Needs assistance/impaired                         Toilet Transfer: Min guard;Ambulation;Rolling walker (2 wheels);BSC/3in1;Cueing for safety   Toileting- Clothing Manipulation and Hygiene: Minimal assistance Toileting - Clothing Manipulation Details (indicate cue type and reason): clothing mgt min A, posterior hygiene total A     Functional mobility during ADLs: Min guard;Minimal assistance;Rolling walker (2 wheels);Cueing for safety General ADL Comments: Pt educated on toileting aid and LH bath sponge for home use. Pt hyperverbose and required constant verbal cues to perform deep pursed lip breathing vs talking tdue to O2 SATs dropping during activity    Extremity/Trunk Assessment Upper Extremity Assessment Upper Extremity Assessment: Overall WFL for tasks assessed   Lower Extremity Assessment Lower Extremity Assessment: Defer to  PT evaluation        Vision Baseline Vision/History: 1 Wears glasses Ability to See in Adequate Light: 0 Adequate Patient Visual Report: No change from baseline     Perception     Praxis      Cognition Arousal/Alertness: Awake/alert Behavior During Therapy: WFL for tasks assessed/performed Overall  Cognitive Status: Within Functional Limits for tasks assessed                                 General Comments: pt able to recall precuations. Pt hyperverbose and required constant verbal cues to perform deep pursed lip breathing vs talking tdue to O2 SATs dropping during activity        Exercises      Shoulder Instructions       General Comments      Pertinent Vitals/ Pain       Pain Assessment Pain Assessment: 0-10 Pain Score: 3  Pain Location: sternal incision Pain Descriptors / Indicators: Aching, Sore Pain Intervention(s): Monitored during session, Repositioned  Home Living                                          Prior Functioning/Environment              Frequency  Min 1X/week        Progress Toward Goals  OT Goals(current goals can now be found in the care plan section)  Progress towards OT goals: Progressing toward goals     Plan Discharge plan remains appropriate    Co-evaluation                 AM-PAC OT "6 Clicks" Daily Activity     Outcome Measure   Help from another person eating meals?: None Help from another person taking care of personal grooming?: None Help from another person toileting, which includes using toliet, bedpan, or urinal?: A Lot Help from another person bathing (including washing, rinsing, drying)?: A Lot Help from another person to put on and taking off regular upper body clothing?: A Lot Help from another person to put on and taking off regular lower body clothing?: A Lot 6 Click Score: 16    End of Session Equipment Utilized During Treatment: Rolling walker (2 wheels);Oxygen  OT Visit Diagnosis: Unsteadiness on feet (R26.81);Muscle weakness (generalized) (M62.81)   Activity Tolerance Patient limited by fatigue   Patient Left in bed;with call bell/phone within reach;Other (comment) (cardiac rehab in to see pt)   Nurse Communication Mobility status;Other (comment)  (toileting)        Time: 1259-1330 OT Time Calculation (min): 31 min  Charges: OT General Charges $OT Visit: 1 Visit OT Treatments $Self Care/Home Management : 8-22 mins $Therapeutic Activity: 8-22 mins    Galen Manila 01/16/2023, 2:45 PM

## 2023-01-16 NOTE — Progress Notes (Signed)
CARDIAC REHAB PHASE I   PRE:  Rate/Rhythm: 76 NSR  BP:  Sitting: 88/46   Recheck: 101/56      SaO2: 90-94 RA  MODE:  Ambulation: 120 ft   AD: RW  POST:  Rate/Rhythm: 88 NSR  BP:  Sitting: 142/70      SaO2: 94 RA  Pt amb with contact guard assistance using gait belt, pt denies CP and reports mod SOB during amb and was returned to room w/o complaint. Pt stumbled after passing room door requiring chair follow. Pt took x2 standing rest breaks for SOB, Sats >92% during ambulation. Pt returned to room and requested to be put back in bed. All needs met and call button in reach.  [name]  ACSM-CEP 2:51 PM 01/16/2023    Service time is from 1300 to 1415.

## 2023-01-16 NOTE — Progress Notes (Signed)
CARDIAC REHAB PHASE I   Pt resting in bed feeling better this morning. Assisted with washing face and hands after breakfast. Agreeable to walk in hall. Discussed sternal precautions, mobility importance and IS use. PT has arrived to evaluate and walk pt. Will allow PT to work with pt now and plan to return later today as time allows. Encouraged pt to stay oob to chair most of the day. Will continue to follow.   [id number] Woodroe Chen, RN BSN 01/16/2023 9:08 AM

## 2023-01-16 NOTE — Progress Notes (Signed)
CARDIAC REHAB PHASE I   Returned to offer walk, pt declined for now. He feels he needs more time to rest before walking again. Reports feeling weak and faint during session with PT. Discussed at length the importance of ambulation, o2 use, fall prevention, oob to chair and IS use. Will return to offer walk later today as time allows.   [id number]  Woodroe Chen, RN BSN 01/16/2023 11:43 AM

## 2023-01-17 ENCOUNTER — Inpatient Hospital Stay (HOSPITAL_COMMUNITY): Payer: Medicare Other

## 2023-01-17 LAB — GLUCOSE, CAPILLARY
Glucose-Capillary: 109 mg/dL — ABNORMAL HIGH (ref 70–99)
Glucose-Capillary: 144 mg/dL — ABNORMAL HIGH (ref 70–99)
Glucose-Capillary: 136 mg/dL — ABNORMAL HIGH (ref 70–99)
Glucose-Capillary: 145 mg/dL — ABNORMAL HIGH (ref 70–99)

## 2023-01-17 MED ORDER — JUVEN PO PACK
1.0000 | PACK | Freq: Two times a day (BID) | ORAL | Status: DC
  Administered 2023-01-17 – 2023-02-04 (×30): 1 via ORAL
  Filled 2023-01-17 (×30): qty 1

## 2023-01-17 MED ORDER — ZINC SULFATE 220 (50 ZN) MG PO CAPS
220.0000 mg | ORAL_CAPSULE | Freq: Every day | ORAL | Status: DC
  Administered 2023-01-17 – 2023-02-04 (×18): 220 mg via ORAL
  Filled 2023-01-17 (×18): qty 1

## 2023-01-17 NOTE — Progress Notes (Signed)
Pt just walked with PT, pt is requesting time to rest before walking again. Will f/u later.  [name] 01/17/2023 9:39 AM

## 2023-01-17 NOTE — Plan of Care (Signed)
Nutrition Education Note  RD consulted for nutrition education regarding a Heart Healthy diet.   Lipid Panel     Component Value Date/Time   CHOL 174 10/28/2022 1020   TRIG 140 10/28/2022 1020   HDL 46 10/28/2022 1020   CHOLHDL 3.8 10/28/2022 1020   CHOLHDL 4 10/17/2021 1705   VLDL 22.2 10/17/2021 1705   LDLCALC 103 (H) 10/28/2022 1020    RD provided "Heart Healthy and Consistent Carb Nutrition Therapy" handout from the Academy of [employer]etics. Reviewed patient's dietary recall. Provided examples on ways to decrease sodium and fat intake in diet. Discouraged intake of processed foods and use of salt shaker. Encouraged fresh fruits and vegetables as well as whole grain sources of carbohydrates to maximize fiber intake. Teach back method used.  Expect optimal compliance.  Body mass index is 43.63 kg/m. Pt meets criteria for morbid based on current BMI.  Current diet order is heart/carb, patient is consuming approximately 100% of meals at this time. Labs and medications reviewed. No further nutrition interventions warranted at this time. RD contact information provided. If additional nutrition issues arise, please re-consult RD.  Leodis Rains, RDN, LDN  Clinical Nutrition

## 2023-01-17 NOTE — TOC Progression Note (Signed)
Transition of Care (TOC) - Progression Note  Donn Pierini RN, BSN Transitions of Care Unit 4E- RN Case Manager See Treatment Team for direct phone #   Patient Details  Name: Bryan Brock MRN: [id number] Date of Birth: June 15, 1956  Transition of Care [facility]) CM/SW Contact  Zenda Alpers Lenn Sink, RN Phone Number: 01/17/2023, 10:03 AM  Clinical Narrative:    Cm spoke with pt at bedside to discuss transition needs and answer questions. Per pt he lives with Bryan Brock who still works but will be there to assist. Pt voiced that he is concerned about how he will do while Bryan Brock is at work and is asking if insurance will cover any assistance/help- explained to pt that Medicare does not cover personal aide assist- and that this type of assistance is covered by patient out of pocket- (avg cost currently $25/hr)- pt asked for resources- which CM will provide for pt should he want to explore that option if needed.   Discussed DME needs- pt voiced he has ordered a shower seat, would like a RW which has been ordered and CM will make referral with a DME provider for delivery closer to discharge. Pt also reported that he already is having toilets replaced with higher ones at home.   HH was also discussed and CM explained to pt about [facility] office referral made by [facility]- discussed choice and pt voiced that he is agreeable to use [facility] for his HH needs- explained Medicare coverage for [facility] skilled services and average number of visits. Pt voiced understanding.  [facility] following for [facility] needs- orders currently in for HHPT/OT  Pt pt Bryan Brock will transport home when medically ready.   TOC will continue to follow    Expected Discharge Plan: Home w Home Health Services Barriers to Discharge: Continued Medical Work up  Expected Discharge Plan and Services In-house Referral: NA Discharge Planning Services: CM Consult Post Acute Care Choice: Durable Medical Equipment, Home Health Living arrangements for the  past 2 months: Single Family Home                 DME Arranged: Walker rolling         HH Arranged: PT, OT HH Agency: [facility]     Representative spoke with at [facility] Agency: Bjorn Loser   Social Determinants of Health (SDOH) Interventions SDOH Screenings   Food Insecurity: No Food Insecurity (01/05/2023)  Housing: Low Risk  (01/05/2023)  Transportation Needs: No Transportation Needs (01/05/2023)  Utilities: Not At Risk (01/05/2023)  Alcohol Screen: Low Risk  (03/05/2022)  Depression (PHQ2-9): Low Risk  (10/31/2022)  Financial Resource Strain: Low Risk  (03/05/2022)  Physical Activity: Inactive (03/05/2022)  Social Connections: Moderately Integrated (03/05/2022)  Stress: No Stress Concern Present (03/05/2022)  Tobacco Use: High Risk (01/08/2023)    Readmission Risk Interventions     No data to display

## 2023-01-17 NOTE — Progress Notes (Signed)
Physical Therapy Treatment Patient Details Name: Bryan Brock MRN: [id number] DOB: November 29, 1955 Today's Date: 01/17/2023   History of Present Illness 67 yo male admitted 5/9 for CABG x 4. 5/14 sternal rewiring due to dehiscence coughing. PMhx: HTN, HLD, bipolar d/o, obesity, sleep apnea, mitral valve prolapse, GERD    PT Comments    Pt pleasant, talkative and needs continued cues to recall and maintain precautions. Pt reports SOB with limited pleth reading and applied 1L for gait and stairs with reading 88-100%. Pt limited by fatigue with repeated standing trials and gait. Pt encouraged to mobilize with each opportunity as he will have to get up more than every couple of hours at home. Will continue to follow. Pt and girlfriend discussing options for rail vs ramp installation for stairs at home. Pt performed well on 3 stairs with RW backward with handout provided.    Recommendations for follow up therapy are one component of a multi-disciplinary discharge planning process, led by the attending physician.  Recommendations may be updated based on patient status, additional functional criteria and insurance authorization.  Follow Up Recommendations       Assistance Recommended at Discharge Intermittent Supervision/Assistance  Patient can return home with the following A little help with walking and/or transfers;A little help with bathing/dressing/bathroom;Assistance with cooking/housework;Assist for transportation;Help with stairs or ramp for entrance   Equipment Recommendations  Rolling walker (2 wheels)    Recommendations for Other Services       Precautions / Restrictions Precautions Precautions: Sternal;Fall Precaution Comments: watch SPO2, mod cues to maintain precautions particularly with scooting Restrictions Weight Bearing Restrictions: Yes (sternal precaution) RUE Weight Bearing: Non weight bearing LUE Weight Bearing: Non weight bearing     Mobility  Bed Mobility Overal  bed mobility: Needs Assistance Bed Mobility: Rolling, Sidelying to Sit Rolling: Min assist Sidelying to sit: Min assist       General bed mobility comments: pt continues to need assist to rroll fully to side and elevate trunk from surface with mod cues    Transfers Overall transfer level: Needs assistance   Transfers: Sit to/from Stand Sit to Stand: Min guard           General transfer comment: pt able to stand from bed x 1 and chair total of 7x with cues for hand placement and safety. Pt frequently trying to reach with arms to push when scooting to edge    Ambulation/Gait Ambulation/Gait assistance: Min guard Gait Distance (Feet): 120 Feet Assistive device: Rolling walker (2 wheels) Gait Pattern/deviations: Step-through pattern, Decreased stride length   Gait velocity interpretation: <1.8 ft/sec, indicate of risk for recurrent falls   General Gait Details: pt walked 10' to door, then additional 8' with stair practice and seated rest prior to 120' to return to room. pt continues to be fatigued with bouts of gait with chair follow for safety and stairs this session. Pt on RA initially with new probe and inconsistent pleth 88-100%, placed on 1L for stairs and remainder of gait with cues for breathing technique and limited talking.   Stairs Stairs: Yes Stairs assistance: Min assist Stair Management: Step to pattern, Backwards, With walker Number of Stairs: 3 General stair comments: cues for sequence, safety and limited UB use. pt able to push up with legs to ascend and maintain limited use of UB   Wheelchair Mobility    Modified Rankin (Stroke Patients Only)       Balance Overall balance assessment: Needs assistance   Sitting balance-Leahy Scale: Good  Standing balance support: Bilateral upper extremity supported, During functional activity Standing balance-Leahy Scale: Fair Standing balance comment: can stand without UB support, RW for gait                             Cognition Arousal/Alertness: Awake/alert Behavior During Therapy: WFL for tasks assessed/performed Overall Cognitive Status: Within Functional Limits for tasks assessed                                 General Comments: continues to need cues to recall all precautions and maintain. Pt receptive to cues to stop talking and focus on breathing to improve activity tolerance        Exercises      General Comments        Pertinent Vitals/Pain Pain Assessment Pain Assessment: 0-10 Pain Score: 3  Pain Location: sternal incision Pain Descriptors / Indicators: Aching Pain Intervention(s): Limited activity within patient's tolerance, Monitored during session, Repositioned    Home Living                          Prior Function            PT Goals (current goals can now be found in the care plan section) Progress towards PT goals: Progressing toward goals    Frequency    Min 1X/week      PT Plan Current plan remains appropriate    Co-evaluation              AM-PAC PT "6 Clicks" Mobility   Outcome Measure  Help needed turning from your back to your side while in a flat bed without using bedrails?: A Little Help needed moving from lying on your back to sitting on the side of a flat bed without using bedrails?: A Little Help needed moving to and from a bed to a chair (including a wheelchair)?: A Little Help needed standing up from a chair using your arms (e.g., wheelchair or bedside chair)?: A Little Help needed to walk in hospital room?: A Little Help needed climbing 3-5 steps with a railing? : A Little 6 Click Score: 18    End of Session Equipment Utilized During Treatment: Oxygen Activity Tolerance: Patient tolerated treatment well Patient left: in chair;with call bell/phone within reach Nurse Communication: Mobility status;Precautions PT Visit Diagnosis: Other abnormalities of gait and mobility (R26.89);Difficulty  in walking, not elsewhere classified (R26.2)     Time: 1610-9604 PT Time Calculation (min) (ACUTE ONLY): 37 min  Charges:  $Gait Training: 8-22 mins $Therapeutic Activity: 8-22 mins                     Merryl Hacker, PT Acute Rehabilitation Services Office: [phone]    Cristine Polio 01/17/2023, 9:34 AM

## 2023-01-17 NOTE — Progress Notes (Signed)
CARDIAC REHAB PHASE I   PRE:  Rate/Rhythm: 82 NSR  BP:  Sitting: 107/74      SaO2: 97 1L  MODE:  Ambulation: 150 ft   AD:   RW  POST:  Rate/Rhythm: 90 NSR  BP:  Sitting: Not Taken: BM      SaO2: 92-98 1L during amb  Pt amb with contact guard assistance using gait belt and chair follow, pt denies CP and reports min-mod SOB during amb and was returned to room. Pt needed many verbal cues to stop talking and focus on pursed lip breathing. Pt needed x2 sitting rest breaks and requested to be rolled back in room for BM. Pt taken to room and helped to [facility]. Call button in reach.    [name]  ACSM-CEP 12:04 PM 01/17/2023    Service time is from 1100 to 1134.

## 2023-01-17 NOTE — Progress Notes (Addendum)
[address]       [place] [address]             [phone]      10 Days Post-Op Procedure(s) (LRB): STERNAL REWIRING (N/A) Subjective: Pt states his chest feels better but he has had some difficulty breathing. Currently saturating well on 1L [place].   Objective: Vital signs in last 24 hours: Temp:  [97.9 F (36.6 C)-98.8 F (37.1 C)] 98.6 F (37 C) (05/24 0312) Pulse Rate:  [65-88] 65 (05/24 0312) Cardiac Rhythm: Atrial flutter (05/23 1931) Resp:  [19-21] 20 (05/24 0312) BP: (97-142)/(43-70) 121/68 (05/24 0312) SpO2:  [85 %-99 %] 99 % (05/24 0312) Weight:  [141.9 kg] 141.9 kg (05/24 0312)  Hemodynamic parameters for last 24 hours:    Intake/Output from previous day: 05/23 0701 - 05/24 0700 In: 1680 [P.O.:1680] Out: 2700 [Urine:2700] Intake/Output this shift: No intake/output data recorded.  General appearance: alert, cooperative, and no distress Neurologic: intact Heart: Regular rate and rhythm, no murmur Lungs: Diminished breath sounds on left side Abdomen: soft, non-tender; bowel sounds normal; no masses,  no organomegaly Extremities: edema 1+ Wound: Unstable left sided sided chest wall at inferior portion of sternal incision due to displaced ribs. Dressing was clean and dry this AM  Lab Results: Recent Labs    01/15/23 0142 01/16/23 0800  WBC 11.4* 8.9  HGB 10.0* 9.9*  HCT 30.7* 30.8*  PLT 461* 496*   BMET:  Recent Labs    01/15/23 0142 01/16/23 0800  NA 127* 129*  K 3.8 3.8  CL 91* 91*  CO2 28 27  GLUCOSE 116* 128*  BUN 20 17  CREATININE 1.32* 1.14  CALCIUM 7.9* 8.3*    PT/INR: No results for input(s): "LABPROT", "INR" in the last 72 hours. ABG    Component Value Date/Time   PHART 7.399 01/08/2023 0830   HCO3 28.2 (H) 01/08/2023 0830   TCO2 30 01/08/2023 0830   ACIDBASEDEF 1.0 01/07/2023 1936   O2SAT 97 01/08/2023 0830   CBG (last 3)  Recent Labs    01/16/23 1632 01/16/23 2117 01/17/23 0616  GLUCAP 113* 125*  109*    Assessment/Plan: S/P Procedure(s) (LRB): STERNAL REWIRING (N/A)  CV: NSR, HR 70s this AM. SBP 97-140s. Continue Amiodarone and Lopressor.   Pulm: Saturating well on 1L [place]. Pt complaining of dyspnea. CXR shows moderate left pleural effusion and left mid and lower lung airspace opacities. Right sided pneumothorax has improved. Left sided thoracentesis? Encourage IS and ambulation.    GI: +BM yesterday. Good appetite.    Endo: CBGs controlled on SSI. New diabetes diagnosis A1C 6.6, outpatient appt with PCP has been arranged.   Renal: Cr 1.14. UO 2700cc/24hrs. Continue diuresis.    ID: Leukocytosis WBC 8.9, Afebrile. On empiric Keflex for sternal site.    Expected postop ABLA: Last H/H stable 9.9/30.9  Hyponatremia: Improved Na 129, volume overload vs diuresis. Will monitor.    Sternal dehiscence: S/P sternal rewiring. Chest CT showed displacement of 3rd, 4th and 5th ribs with similar to increased air and fluid levels around the site. Dressing had just been changed this AM when I arrived but was clean and dry, nurse states it was dry when she changed it this AM. Will continue dressing changes and monitor.    Deconditioning: Continue work with PT   Dispo: Continue dressing changes and ambulation, will continue to monitor sternal site closely. Discuss with surgeon possible left sided thoracentesis.   LOS: 15 days  Jenny Reichmann, PA-C 01/17/2023  Patient examined, images of today's chest x-ray and CT scan of chest 48 hours ago personally reviewed. Sternal incision examined and sterile dressing with Betadine and 4 x 4 gauze personally performed. The sternal incision appears to be healing.  There is no evidence of cellulitis.  There is no drainage.  Most of the chest wall instability is lateral in the area of the left lower costochondral junction. Patient is in sinus rhythm Breath sounds are clear Leg incisions are healing He has 2+ lower extremity edema and remains on  Lasix 40 mg a day.  There is no drainage to culture. Will continue to daily Betadine 4 x 4 dressing changes, close observation, oral Keflex, and sternal precautions carefully followed. Nutritional services been requested to optimize his diet for wound healing.  Will also add low-dose oral zinc. patient examined and medical record reviewed,agree with above note. Lovett Sox 01/17/2023

## 2023-01-18 ENCOUNTER — Inpatient Hospital Stay (HOSPITAL_COMMUNITY): Payer: Medicare Other

## 2023-01-18 LAB — GLUCOSE, CAPILLARY
Glucose-Capillary: 93 mg/dL (ref 70–99)
Glucose-Capillary: 93 mg/dL (ref 70–99)
Glucose-Capillary: 160 mg/dL — ABNORMAL HIGH (ref 70–99)
Glucose-Capillary: 94 mg/dL (ref 70–99)
Glucose-Capillary: 128 mg/dL — ABNORMAL HIGH (ref 70–99)

## 2023-01-18 LAB — BASIC METABOLIC PANEL
Anion gap: 9 (ref 5–15)
BUN: 21 mg/dL (ref 8–23)
CO2: 27 mmol/L (ref 22–32)
Calcium: 8.4 mg/dL — ABNORMAL LOW (ref 8.9–10.3)
Chloride: 92 mmol/L — ABNORMAL LOW (ref 98–111)
Creatinine, Ser: 1.17 mg/dL (ref 0.61–1.24)
GFR, Estimated: >60 mL/min (ref >60)
Glucose, Bld: 120 mg/dL — ABNORMAL HIGH (ref 70–99)
Potassium: 4.2 mmol/L (ref 3.5–5.1)
Sodium: 128 mmol/L — ABNORMAL LOW (ref 135–145)

## 2023-01-18 MED ORDER — POTASSIUM CHLORIDE CRYS ER 20 MEQ PO TBCR
20.0000 meq | EXTENDED_RELEASE_TABLET | Freq: Two times a day (BID) | ORAL | Status: DC
  Administered 2023-01-18: 20 meq via ORAL
  Filled 2023-01-18: qty 1

## 2023-01-18 MED ORDER — FUROSEMIDE 40 MG PO TABS
40.0000 mg | ORAL_TABLET | Freq: Two times a day (BID) | ORAL | Status: DC
  Administered 2023-01-18: 40 mg via ORAL
  Filled 2023-01-18: qty 1

## 2023-01-18 MED ORDER — AMIODARONE HCL 200 MG PO TABS
200.0000 mg | ORAL_TABLET | Freq: Two times a day (BID) | ORAL | Status: DC
  Administered 2023-01-18 – 2023-01-24 (×13): 200 mg via ORAL
  Filled 2023-01-18 (×13): qty 1

## 2023-01-18 NOTE — Progress Notes (Addendum)
CARDIAC REHAB PHASE I   PRE:  Rate/Rhythm: 77 NSR  BP  Sitting: 110/61     SaO2: 100% 1L  MODE:  Ambulation: 240 ft   POST:  Rate/Rhythem: 96 NSR  BP:  Sitting: 152/82    SaO2: 99% 1L  Pt amb with contact guard assistance 240 ft using front wheel walker. Pt denies CP or dizziness. Pt had to stop twice due to SOB. Sats stable throughout walk on 1L. Pt placed in recliner with call bell in reach.   Guss Bunde, RRT Service time is from 1005 to 1035.

## 2023-01-18 NOTE — Progress Notes (Signed)
[address]       [place] [address]             [phone]      11 Days Post-Op Procedure(s) (LRB): STERNAL REWIRING (N/A) Subjective: Continues to complain of feeling short of breath, says its unchanged from yesterday.   O2 sat 97% on 1L/[place].    Objective: Vital signs in last 24 hours: Temp:  [97.5 F (36.4 C)-98.2 F (36.8 C)] 98.1 F (36.7 C) (05/25 1107) Pulse Rate:  [63-89] 89 (05/25 1107) Cardiac Rhythm: Normal sinus rhythm (05/25 0905) Resp:  [18-20] 20 (05/25 1107) BP: (101-134)/(50-102) 134/60 (05/25 1107) SpO2:  [91 %-100 %] 97 % (05/25 1107) Weight:  [141.4 kg] 141.4 kg (05/25 0555)    Intake/Output from previous day: 05/24 0701 - 05/25 0700 In: 1196 [P.O.:1196] Out: 2301 [Urine:2300; Stool:1] Intake/Output this shift: No intake/output data recorded.  General appearance: alert, cooperative, and no distress Neurologic: intact Heart: Regular rate and rhythm, no murmur Lungs: Diminished breath sounds on left side Abdomen: soft, non-tender Wound:. Left parasternal instability which is not new. The sternal dressing placed last night is dry.   Lab Results: Recent Labs    01/16/23 0800  WBC 8.9  HGB 9.9*  HCT 30.8*  PLT 496*    BMET:  Recent Labs    01/16/23 0800 01/18/23 0107  NA 129* 128*  K 3.8 4.2  CL 91* 92*  CO2 27 27  GLUCOSE 128* 120*  BUN 17 21  CREATININE 1.14 1.17  CALCIUM 8.3* 8.4*     PT/INR: No results for input(s): "LABPROT", "INR" in the last 72 hours. ABG    Component Value Date/Time   PHART 7.399 01/08/2023 0830   HCO3 28.2 (H) 01/08/2023 0830   TCO2 30 01/08/2023 0830   ACIDBASEDEF 1.0 01/07/2023 1936   O2SAT 97 01/08/2023 0830   CBG (last 3)  Recent Labs    01/17/23 2119 01/18/23 0607 01/18/23 0800  GLUCAP 145* 93 93     Assessment/Plan: S/P Procedure(s) (LRB): STERNAL REWIRING (N/A)  POD 16 CABG x4, normal EF POD 11 Sternal rewiring for sternal dehiscence  CV: NSR, HR  60-80. BP stable.   Post-op atrial fibrillation- On amiodarone and Lopressor and maintaining SR.  Amiodarone was started about 2 weeks ago with standard IV and oral loading doses. Will decrease to 200mg  BID.    Pulm: Saturating well on 1L [place]. Pt complaining of dyspnea. CXR shows moderate left pleural effusion and left mid and lower lung airspace opacities. Continues to have a small right PTX. CT from 5/22 reviewed--did not appear to have enough pleural fluid to warrant thoracentesis.  Will repeat the CXR tomorrow.     GI: Good appetite and appropriate bowel function   Endo: CBGs controlled 90-160 on SSI. New diabetes diagnosis A1C 6.6, outpatient appt with PCP has been arranged.   Renal: Cr 1.14. UO 2700cc/24hrs. Continue diuresis.    ID: Leukocytosis WBC 8.9, Afebrile. On empiric Keflex for sternal site.    Expected postop ABLA: Last H/H stable 9.9/30.9  Hyponatremia:  Na 128, volume overload vs diuresis. Will monitor.    Sternal dehiscence: S/P sternal rewiring. Chest CT showed displacement of 3rd, 4th and 5th ribs with similar to increased air and fluid levels around the site. Dressing changed by me at the bedside today. Two 4x4's at the lower third of the sternal incision were saturated with serous drainage. The remainder of the dressings were dry. The incisions  were painted with betadine and covered with sterile dry 4x4's and a single ABD.   Deconditioning: Continue work with PT   Dispo: Continue wound care and ambulation.  Follow up on left pleural effusion and right PTX with CXR in am. Diurese.    LOS: 16 days    Leary Roca, PA-C 01/18/2023

## 2023-01-19 ENCOUNTER — Inpatient Hospital Stay (HOSPITAL_COMMUNITY): Payer: Medicare Other

## 2023-01-19 DIAGNOSIS — J95811 Postprocedural pneumothorax: Secondary | ICD-10-CM | POA: Diagnosis not present

## 2023-01-19 DIAGNOSIS — J90 Pleural effusion, not elsewhere classified: Secondary | ICD-10-CM | POA: Diagnosis not present

## 2023-01-19 DIAGNOSIS — J9601 Acute respiratory failure with hypoxia: Secondary | ICD-10-CM

## 2023-01-19 DIAGNOSIS — Z951 Presence of aortocoronary bypass graft: Secondary | ICD-10-CM | POA: Diagnosis not present

## 2023-01-19 LAB — TYPE AND SCREEN: Antibody Screen: NEGATIVE

## 2023-01-19 LAB — CBC
HCT: 31.6 % — ABNORMAL LOW (ref 39.0–52.0)
Hemoglobin: 10 g/dL — ABNORMAL LOW (ref 13.0–17.0)
MCH: 30.5 pg (ref 26.0–34.0)
MCHC: 31.6 g/dL (ref 30.0–36.0)
MCV: 96.3 fL (ref 80.0–100.0)
Platelets: 557 10*3/uL — ABNORMAL HIGH (ref 150–400)
RBC: 3.28 MIL/uL — ABNORMAL LOW (ref 4.22–5.81)
RDW: 14.1 % (ref 11.5–15.5)
WBC: 8.7 10*3/uL (ref 4.0–10.5)
nRBC: 0 % (ref 0.0–0.2)

## 2023-01-19 LAB — LACTATE DEHYDROGENASE, PLEURAL OR PERITONEAL FLUID: LD, Fluid: 211 U/L — ABNORMAL HIGH (ref 3–23)

## 2023-01-19 LAB — BODY FLUID CELL COUNT WITH DIFFERENTIAL
Eos, Fluid: 2 %
Lymphs, Fluid: 77 %
Monocyte-Macrophage-Serous Fluid: 15 % — ABNORMAL LOW (ref 50–90)
Neutrophil Count, Fluid: 6 % (ref 0–25)
Total Nucleated Cell Count, Fluid: 411 cu mm (ref 0–1000)

## 2023-01-19 LAB — PROTEIN, PLEURAL OR PERITONEAL FLUID: Total protein, fluid: <3 g/dL

## 2023-01-19 LAB — GLUCOSE, CAPILLARY
Glucose-Capillary: 84 mg/dL (ref 70–99)
Glucose-Capillary: 143 mg/dL — ABNORMAL HIGH (ref 70–99)
Glucose-Capillary: 82 mg/dL (ref 70–99)
Glucose-Capillary: 126 mg/dL — ABNORMAL HIGH (ref 70–99)

## 2023-01-19 LAB — BASIC METABOLIC PANEL
Anion gap: 11 (ref 5–15)
BUN: 27 mg/dL — ABNORMAL HIGH (ref 8–23)
CO2: 24 mmol/L (ref 22–32)
Calcium: 8 mg/dL — ABNORMAL LOW (ref 8.9–10.3)
Chloride: 93 mmol/L — ABNORMAL LOW (ref 98–111)
Creatinine, Ser: 1.25 mg/dL — ABNORMAL HIGH (ref 0.61–1.24)
GFR, Estimated: >60 mL/min (ref >60)
Glucose, Bld: 106 mg/dL — ABNORMAL HIGH (ref 70–99)
Potassium: 4.7 mmol/L (ref 3.5–5.1)
Sodium: 128 mmol/L — ABNORMAL LOW (ref 135–145)

## 2023-01-19 LAB — GLUCOSE, PLEURAL OR PERITONEAL FLUID: Glucose, Fluid: 134 mg/dL

## 2023-01-19 LAB — PREPARE RBC (CROSSMATCH)

## 2023-01-19 LAB — BODY FLUID CULTURE W GRAM STAIN

## 2023-01-19 LAB — BPAM RBC

## 2023-01-19 LAB — LACTATE DEHYDROGENASE: LDH: 271 U/L — ABNORMAL HIGH (ref 98–192)

## 2023-01-19 MED ORDER — POTASSIUM CHLORIDE CRYS ER 20 MEQ PO TBCR
20.0000 meq | EXTENDED_RELEASE_TABLET | Freq: Every day | ORAL | Status: DC
  Administered 2023-01-21: 20 meq via ORAL
  Filled 2023-01-19 (×2): qty 1

## 2023-01-19 MED ORDER — VANCOMYCIN HCL 1500 MG/300ML IV SOLN
1500.0000 mg | INTRAVENOUS | Status: DC
  Filled 2023-01-19: qty 300

## 2023-01-19 MED ORDER — ALBUTEROL SULFATE (2.5 MG/3ML) 0.083% IN NEBU: 2.5000 mg | INHALATION_SOLUTION | RESPIRATORY_TRACT | Status: DC | PRN

## 2023-01-19 MED ORDER — FUROSEMIDE 10 MG/ML IJ SOLN
40.0000 mg | Freq: Every day | INTRAMUSCULAR | Status: AC
  Administered 2023-01-19 – 2023-01-21 (×3): 40 mg via INTRAVENOUS
  Filled 2023-01-19 (×3): qty 4

## 2023-01-19 MED ORDER — VANCOMYCIN HCL 1500 MG/300ML IV SOLN
1500.0000 mg | INTRAVENOUS | Status: DC
  Administered 2023-01-19 – 2023-01-22 (×4): 1500 mg via INTRAVENOUS
  Filled 2023-01-19 (×6): qty 300

## 2023-01-19 MED ORDER — METOLAZONE 5 MG PO TABS
5.0000 mg | ORAL_TABLET | Freq: Every day | ORAL | Status: AC
  Administered 2023-01-19 – 2023-01-21 (×3): 5 mg via ORAL
  Filled 2023-01-19 (×3): qty 1

## 2023-01-19 MED ORDER — IPRATROPIUM-ALBUTEROL 0.5-2.5 (3) MG/3ML IN SOLN
3.0000 mL | Freq: Three times a day (TID) | RESPIRATORY_TRACT | Status: DC
  Administered 2023-01-19: 3 mL via RESPIRATORY_TRACT
  Filled 2023-01-19: qty 3

## 2023-01-19 MED ORDER — METOCLOPRAMIDE HCL 5 MG PO TABS
10.0000 mg | ORAL_TABLET | Freq: Three times a day (TID) | ORAL | Status: DC
  Administered 2023-01-19 – 2023-02-04 (×40): 10 mg via ORAL
  Filled 2023-01-19 (×8): qty 2
  Filled 2023-01-19: qty 1
  Filled 2023-01-19 (×15): qty 2
  Filled 2023-01-19: qty 1
  Filled 2023-01-19 (×9): qty 2
  Filled 2023-01-19: qty 1
  Filled 2023-01-19 (×4): qty 2
  Filled 2023-01-19: qty 1

## 2023-01-19 MED ORDER — IPRATROPIUM-ALBUTEROL 0.5-2.5 (3) MG/3ML IN SOLN
3.0000 mL | Freq: Four times a day (QID) | RESPIRATORY_TRACT | Status: DC
  Administered 2023-01-19 (×2): 3 mL via RESPIRATORY_TRACT
  Filled 2023-01-19 (×2): qty 3

## 2023-01-19 MED ORDER — IPRATROPIUM-ALBUTEROL 0.5-2.5 (3) MG/3ML IN SOLN
3.0000 mL | RESPIRATORY_TRACT | Status: DC
  Administered 2023-01-20: 3 mL via RESPIRATORY_TRACT
  Filled 2023-01-19 (×2): qty 3

## 2023-01-19 MED ORDER — BUDESONIDE 0.5 MG/2ML IN SUSP
0.5000 mg | Freq: Two times a day (BID) | RESPIRATORY_TRACT | Status: DC
  Administered 2023-01-19 – 2023-02-04 (×29): 0.5 mg via RESPIRATORY_TRACT
  Filled 2023-01-19 (×32): qty 2

## 2023-01-19 NOTE — Progress Notes (Signed)
   NAME:  Bryan Brock, MRN:  [id number], DOB:  September 08, 1955, LOS: 17 ADMISSION DATE:  01/02/2023, CONSULTATION DATE:  01/18/2023 REFERRING MD:  Jillyn Hidden PA , CHIEF COMPLAINT:  Dyspnea    History of Present Illness:  Bryan Brock is a 67 y.o. male with a PMH significant for but not limited to HTN, HLD, CAD now s/p CABG x 4, sleep apnea, mitral valve prolapse, anxiety and depression who presented 5/9 for elective CABG.  Postoperatively patient tolerated procedure well.  Unfortunately 5/13 patient's pain significant coughing episode which resulted in wires being dislodged and requirement of rewiring in the OR 5/14.   PCCM reconsulted 5/26 for ongoing dyspnea  Pertinent  Medical History  HTN, HLD, CAD now s/p CABG x 4, sleep apnea, mitral valve prolapse, anxiety and depression  Significant Hospital Events: Including procedures, antibiotic start and stop dates in addition to other pertinent events   5/19 elective CABG x 4 To OR 5/14 for sternal re-wiring  5/15 extubated 5/20 Ambulating well in the halls.  2/26 continued reports without hypoxemia.  CT chest repeated and persistent moderate right pneumothorax  Interim History / Subjective:  Seen sitting up in bedside recliner with continued complaints of dyspnea  Objective   Blood pressure 96/84, pulse 81, temperature 97.9 F (36.6 C), temperature source Oral, resp. rate 20, height 5\' 11"  (1.803 m), weight 114.2 kg, SpO2 95 %.        Intake/Output Summary (Last 24 hours) at 01/19/2023 1310 Last data filed at 01/19/2023 1245 Gross per 24 hour  Intake 880 ml  Output 3050 ml  Net -2170 ml   Filed Weights   01/17/23 0312 01/18/23 0555 01/19/23 0705  Weight: (!) 141.9 kg (!) 141.4 kg 114.2 kg    Examination: General: Very pleasant middle-age male sitting up in bedside recliner no acute distress HEENT: [place]/AT, MM pink/moist, PERRL,  Neuro: X 3, nonfocal CV: s1s2 regular rate and rhythm, no murmur, rubs, or gallops,  PULM: Anterior  dependent crackles, no increased work of breathing,, sentences slightly fragmented GI: soft, bowel sounds active in all 4 quadrants, non-tender, non-distended, tolerating oral diet Extremities: warm/dry, non pitting edema  Skin: no rashes or lesions  Resolved Hospital Problem list     Assessment & Plan:  Acute hypoxic respiratory failure  OSA Pleural effusions Hypoventilation 2/2 splinting.  ?hx asthma  Moderate left pneumothorax -Seen on CT 5/24 and 5/26 Severe CAD s/p CABG x 4 on 01/02/23 Sternal dehiscence s/p sternal rewiring  Afib with RVR HTN HLD P: Coordinate with cardiothoracic surgery need for placement of chest tube, may need to involve IR for placement Continue supplemental oxygen for sat goal greater than 92 Pulmonary hygiene Added flutter valve Mobilize as able Continue bronchodilators Diurese as able  Best Practice (right click and "Reselect all SmartList Selections" daily)  Per Primary    Critical care time: NA  Madelyn Tlatelpa D. Harris, NP-C [facility] Personal contact information can be found on Amion  If no contact or response made please call [phone] 01/19/2023, 3:05 PM

## 2023-01-19 NOTE — Procedures (Signed)
Thoracentesis  Procedure Note  Bryan Brock  [id number]  03/18/1956  Date:01/19/23  Time:4:36 PM   Provider Performing:Avery Eustice Audrie Lia   Procedure: Thoracentesis with imaging guidance (02725)  Indication(s) Pleural Effusion- loculated, post-operative  Consent Risks of the procedure as well as the alternatives and risks of each were explained to the patient and/or caregiver.  Consent for the procedure was obtained and is signed in the bedside chart  Anesthesia Topical only with 1% lidocaine    Time Out Verified patient identification, verified procedure, site/side was marked, verified correct patient position, special equipment/implants available, medications/allergies/relevant history reviewed, required imaging and test results available.   Sterile Technique Maximal sterile technique including full sterile barrier drape, hand hygiene, sterile gown, sterile gloves, mask, hair covering, sterile ultrasound probe cover (if used).  Procedure Description Ultrasound was used to identify appropriate pleural anatomy for placement and overlying skin marked.  Area of drainage cleaned and draped in sterile fashion. Lidocaine was used to anesthetize the skin and subcutaneous tissue.  50 cc's of dark amber appearing fluid was drained from the left pleural space. Catheter then removed and bandaid applied to site.   Complications/Tolerance None; patient tolerated the procedure well. Chest X-ray is ordered to confirm no post-procedural complication.   EBL Minimal   Specimen(s) Pleural fluid- sent for cell count with diff, LDH, protein, cultures- routine, AFB, fungal  Bryan Dunn, DO 01/19/23 4:37 PM [facility]  For contact information, see Amion. If no response to pager, please call PCCM consult pager. After hours, 7PM- 7AM, please call Elink.

## 2023-01-19 NOTE — Progress Notes (Signed)
Pharmacy Antibiotic Note  Bryan Brock is a 67 y.o. male admitted on 01/02/2023 with  sternal wound infection .  Pharmacy has been consulted for vanc dosing.  Pt is s/p CABG and sternal wound re-wiring. Due to drainage and need for wound vac in AM. Empiric vanc has been ordered. Will dc the pre-op vanc dose tomorrow prior to the procedure.   Scr 1.25  Plan: Vanc 1.5g IV q24>>AUC 483, scr 1.25  Levels as needed  Height: 5\' 11"  (180.3 cm) Weight: 114.2 kg (251 lb 12.3 oz) IBW/kg (Calculated) : 75.3  Temp (24hrs), Avg:98.4 F (36.9 C), Min:97.9 F (36.6 C), Max:98.8 F (37.1 C)  Recent Labs  Lab 01/13/23 0110 01/14/23 0147 01/14/23 0358 01/15/23 0142 01/16/23 0800 01/18/23 0107 01/19/23 0137 01/19/23 1408  WBC 7.5 10.9*  --  11.4* 8.9  --   --  8.7  CREATININE 1.14  --  1.22 1.32* 1.14 1.17 1.25*  --     Estimated Creatinine Clearance: 72.7 mL/min (A) (by C-G formula based on SCr of 1.25 mg/dL (H)).    Allergies  Allergen Reactions   Latex Dermatitis   Lisinopril Cough    Antimicrobials this admission: 5/23 keflex>>5/26 5/26 vanc>>  Dose adjustments this admission:   Microbiology results: 5/26 body fluid>> 5/26 AFB>>  Bryan Brock, PharmD, [place], AAHIVP, CPP Infectious Disease Pharmacist 01/19/2023 7:21 PM

## 2023-01-19 NOTE — Progress Notes (Addendum)
12 Days Post-Op Procedure(s) (LRB): STERNAL REWIRING (N/A) Subjective: Patient examined, images of today CT scan of chest reviewed and discussed with Dr. Leafy Ro and the patient.  Sternal dressing replaced personally with sterile technique Betadine paint and dry 4 x 4 gauze. The lower sternal incision has more erythema and has started draining serous fluid. The CT scan shows significant increase in the size of a chest wall fluid collection since the last CT scan 4 days ago. The patient remains afebrile and is actually breathing somewhat better this evening after diuresis.  Because of the risk of infection with the fluid collection that is now draining through the incision, a lower sternal incision and drainage of the fluid with irrigation and placement of a wound VAC system is recommended.  I have discussed the procedure in detail with the patient including the use of general anesthesia, the location decision, the plan to leave alone the upper sternal wires which are secure and intact, and the plan to probably transfer the patient to ICU for observation postoperatively.  He understands and is willing to proceed with this procedure tomorrow.  Objective: Vital signs in last 24 hours: Temp:  [97.9 F (36.6 C)-98.8 F (37.1 C)] 98.8 F (37.1 C) (05/26 1647) Pulse Rate:  [75-86] 75 (05/26 1702) Cardiac Rhythm: Normal sinus rhythm (05/26 0850) Resp:  [18-23] 19 (05/26 1702) BP: (96-123)/(55-91) 115/67 (05/26 1702) SpO2:  [95 %-100 %] 100 % (05/26 1702) Weight:  [114.2 kg] 114.2 kg (05/26 0705)  Hemodynamic parameters for last 24 hours:  Sinus rhythm afebrile  Intake/Output from previous day: 05/25 0701 - 05/26 0700 In: 540 [P.O.:540] Out: 1600 [Urine:1600] Intake/Output this shift: Total I/O In: 580 [P.O.:580] Out: 2350 [Urine:2350]  Exam  General patient is alert and appropriate and actually breathing more comfortably this evening.  He is anxious about having the sternal wound  infected. Lungs clearing with no wheezing this p.m. Sinus rhythm 2+ peripheral edema  Lab Results: Recent Labs    01/19/23 1408  WBC 8.7  HGB 10.0*  HCT 31.6*  PLT 557*   BMET:  Recent Labs    01/18/23 0107 01/19/23 0137  NA 128* 128*  K 4.2 4.7  CL 92* 93*  CO2 27 24  GLUCOSE 120* 106*  BUN 21 27*  CREATININE 1.17 1.25*  CALCIUM 8.4* 8.0*    PT/INR: No results for input(s): "LABPROT", "INR" in the last 72 hours. ABG    Component Value Date/Time   PHART 7.399 01/08/2023 0830   HCO3 28.2 (H) 01/08/2023 0830   TCO2 30 01/08/2023 0830   ACIDBASEDEF 1.0 01/07/2023 1936   O2SAT 97 01/08/2023 0830   CBG (last 3)  Recent Labs    01/19/23 0603 01/19/23 1201 01/19/23 1649  GLUCAP 84 143* 82    Assessment/Plan: S/P Procedure(s) (LRB): STERNAL REWIRING (N/A) Plan return to the OR tomorrow for incision and drainage of lower sternal incision, pulse lavage irrigation of the wound, and placement of the wound VAC.  Patient understands and agrees with this plan. Because of increased erythema of the lower sternal incision the patient will be placed on IV antibiotics this evening.  LOS: 17 days    Lovett Sox 01/19/2023

## 2023-01-19 NOTE — Progress Notes (Signed)
     [address]       [place] [address]             [phone]       EVENING ROUNDS  Reviewed CT scan  Has significant anterior chest wall collection on left (palpable on exam) and complete dehiscence of left ribs from sternum Pulmonary performed Left thorocentesis for diagnostics I am concerned that the sternum will need to be explored and seroma drained and cultured but that ultimate fate will be sternectomy and flaps down the line Pt and wife had issues discussed and they wish to wait to have primary surgeon involved in surgery and I have no reason to not agree since no signs of acute toxity of patient from undrained infection.  Will assess tomorrow but keep NPO after midnight to be able to drain tomorrow if needed

## 2023-01-19 NOTE — Progress Notes (Signed)
Mobility Specialist: Progress Note   01/19/23 1705  Mobility  Activity Refused mobility   Pt refused mobility d/t recently having thoracentesis. Will f/u as able.   Bryan Brock Mobility Specialist Please contact via SecureChat or Rehab office at [phone]

## 2023-01-19 NOTE — Progress Notes (Signed)
12 Days Post-Op Procedure(s) (LRB): STERNAL REWIRING (N/A) Subjective: Was very uncomfortable overnight with shortness of breath and difficulty breathing Patient was coughing and wheezing when I entered the room Chest x-ray late afternoon shows interstitial edema pattern left greater than right, doubt significant pleural effusion. Despite his respiratory difficulties he is maintaining saturations in the high 90s on only 1 L.  Will increase diuresis to IV Lasix with Zaroxolyn Add Pulmicort inhaler for reactive airway disease and start Reglan for probable reflux.  Hold IV steroids because of significant wound issues.  I suspect some of his symptoms are due to chest wall instability with the left costochondral junction moving paradoxically.  I change the sternal dressing after painting incision with Betadine.  It remains intact although this a.m. there was some scant serosanguineous drainage on the lower gauze.  Objective: Vital signs in last 24 hours: Temp:  [98.1 F (36.7 C)-98.3 F (36.8 C)] 98.3 F (36.8 C) (05/26 0406) Pulse Rate:  [72-89] 76 (05/25 2255) Cardiac Rhythm: Normal sinus rhythm (05/25 2358) Resp:  [18-24] 18 (05/26 0406) BP: (113-134)/(55-61) 113/58 (05/26 0406) SpO2:  [95 %-100 %] 100 % (05/25 2255) Weight:  [161.0 kg] 114.2 kg (05/26 0705)  Hemodynamic parameters for last 24 hours:  Sinus rhythm on oral amiodarone  Intake/Output from previous day: 05/25 0701 - 05/26 0700 In: 240 [P.O.:240] Out: 1600 [Urine:1600] Intake/Output this shift: No intake/output data recorded.  Exam Anxious with complaints of difficulty breathing Sinus rhythm Sternal incision intact, left anterior chest with some paradoxical motion Abdomen obese but nontender 2+ edema  Lab Results: Recent Labs    01/16/23 0800  WBC 8.9  HGB 9.9*  HCT 30.8*  PLT 496*   BMET:  Recent Labs    01/18/23 0107 01/19/23 0137  NA 128* 128*  K 4.2 4.7  CL 92* 93*  CO2 27 24  GLUCOSE 120*  106*  BUN 21 27*  CREATININE 1.17 1.25*  CALCIUM 8.4* 8.0*    PT/INR: No results for input(s): "LABPROT", "INR" in the last 72 hours. ABG    Component Value Date/Time   PHART 7.399 01/08/2023 0830   HCO3 28.2 (H) 01/08/2023 0830   TCO2 30 01/08/2023 0830   ACIDBASEDEF 1.0 01/07/2023 1936   O2SAT 97 01/08/2023 0830   CBG (last 3)  Recent Labs    01/18/23 1555 01/18/23 2111 01/19/23 0603  GLUCAP 94 128* 84    Assessment/Plan: S/P Procedure(s) (LRB): STERNAL REWIRING (N/A) Will increase diuretic therapy and placed patient on moderate fluid restriction for sodium 128 and interstitial edema on x-ray Add Pulmicort inhaler and regular doses of Reglan CT of chest was ordered for later today.  LOS: 17 days    Lovett Sox 01/19/2023

## 2023-01-20 ENCOUNTER — Inpatient Hospital Stay (HOSPITAL_COMMUNITY): Payer: Medicare Other | Admitting: Certified Registered"

## 2023-01-20 ENCOUNTER — Inpatient Hospital Stay (HOSPITAL_COMMUNITY): Payer: Medicare Other

## 2023-01-20 ENCOUNTER — Encounter (HOSPITAL_COMMUNITY): Payer: Self-pay | Admitting: Surgery

## 2023-01-20 ENCOUNTER — Encounter (HOSPITAL_COMMUNITY)
Admission: RE | Disposition: A | Discharge status: Still In Hospital | Payer: Self-pay | Source: Ambulatory Visit | Attending: Surgery

## 2023-01-20 DIAGNOSIS — J45909 Unspecified asthma, uncomplicated: Secondary | ICD-10-CM

## 2023-01-20 DIAGNOSIS — G4730 Sleep apnea, unspecified: Secondary | ICD-10-CM

## 2023-01-20 DIAGNOSIS — T8130XA Disruption of wound, unspecified, initial encounter: Secondary | ICD-10-CM | POA: Diagnosis not present

## 2023-01-20 DIAGNOSIS — F418 Other specified anxiety disorders: Secondary | ICD-10-CM | POA: Diagnosis not present

## 2023-01-20 DIAGNOSIS — I9789 Other postprocedural complications and disorders of the circulatory system, not elsewhere classified: Secondary | ICD-10-CM

## 2023-01-20 HISTORY — PX: APPLICATION OF WOUND VAC: SHX5189

## 2023-01-20 HISTORY — PX: STERNAL WOUND DEBRIDEMENT: SHX1058

## 2023-01-20 LAB — BASIC METABOLIC PANEL
Anion gap: 11 (ref 5–15)
BUN: 25 mg/dL — ABNORMAL HIGH (ref 8–23)
CO2: 28 mmol/L (ref 22–32)
Calcium: 8.1 mg/dL — ABNORMAL LOW (ref 8.9–10.3)
Chloride: 93 mmol/L — ABNORMAL LOW (ref 98–111)
Creatinine, Ser: 1.31 mg/dL — ABNORMAL HIGH (ref 0.61–1.24)
GFR, Estimated: 59 mL/min — ABNORMAL LOW (ref >60)
Glucose, Bld: 120 mg/dL — ABNORMAL HIGH (ref 70–99)
Potassium: 3.2 mmol/L — ABNORMAL LOW (ref 3.5–5.1)
Sodium: 132 mmol/L — ABNORMAL LOW (ref 135–145)

## 2023-01-20 LAB — GLUCOSE, CAPILLARY
Glucose-Capillary: 78 mg/dL (ref 70–99)
Glucose-Capillary: 117 mg/dL — ABNORMAL HIGH (ref 70–99)
Glucose-Capillary: 150 mg/dL — ABNORMAL HIGH (ref 70–99)
Glucose-Capillary: 157 mg/dL — ABNORMAL HIGH (ref 70–99)

## 2023-01-20 LAB — BODY FLUID CULTURE W GRAM STAIN: Culture: NO GROWTH

## 2023-01-20 LAB — AEROBIC/ANAEROBIC CULTURE W GRAM STAIN (SURGICAL/DEEP WOUND)

## 2023-01-20 SURGERY — DEBRIDEMENT, WOUND, STERNUM
Anesthesia: General

## 2023-01-20 MED ORDER — FENTANYL CITRATE (PF) 250 MCG/5ML IJ SOLN
INTRAMUSCULAR | Status: AC
  Filled 2023-01-20: qty 5

## 2023-01-20 MED ORDER — MORPHINE SULFATE (PF) 4 MG/ML IV SOLN: 4.0000 mg | INTRAVENOUS | Status: DC | PRN

## 2023-01-20 MED ORDER — BISACODYL 5 MG PO TBEC
10.0000 mg | DELAYED_RELEASE_TABLET | Freq: Every day | ORAL | Status: DC
  Administered 2023-01-20 – 2023-02-01 (×12): 10 mg via ORAL
  Filled 2023-01-20 (×11): qty 2

## 2023-01-20 MED ORDER — LIDOCAINE 2% (20 MG/ML) 5 ML SYRINGE
INTRAMUSCULAR | Status: DC | PRN
  Administered 2023-01-20: 40 mg via INTRAVENOUS

## 2023-01-20 MED ORDER — ROCURONIUM BROMIDE 10 MG/ML (PF) SYRINGE
PREFILLED_SYRINGE | INTRAVENOUS | Status: AC
  Filled 2023-01-20: qty 10

## 2023-01-20 MED ORDER — ACETAMINOPHEN 160 MG/5ML PO SOLN
1000.0000 mg | Freq: Four times a day (QID) | ORAL | Status: DC
  Filled 2023-01-20: qty 40.6

## 2023-01-20 MED ORDER — LACTATED RINGERS IV SOLN: INTRAVENOUS | Status: DC | PRN

## 2023-01-20 MED ORDER — POTASSIUM CHLORIDE CRYS ER 20 MEQ PO TBCR
20.0000 meq | EXTENDED_RELEASE_TABLET | ORAL | Status: AC
  Administered 2023-01-20 (×3): 20 meq via ORAL
  Filled 2023-01-20 (×3): qty 1

## 2023-01-20 MED ORDER — VANCOMYCIN HCL 1000 MG IV SOLR
INTRAVENOUS | Status: AC
  Filled 2023-01-20: qty 20

## 2023-01-20 MED ORDER — SODIUM CHLORIDE 0.9 % IR SOLN
Status: DC | PRN
  Administered 2023-01-20: 2000 mL

## 2023-01-20 MED ORDER — DEXAMETHASONE SODIUM PHOSPHATE 10 MG/ML IJ SOLN
INTRAMUSCULAR | Status: AC
  Filled 2023-01-20: qty 1

## 2023-01-20 MED ORDER — CEFAZOLIN SODIUM 1 G IJ SOLR
INTRAMUSCULAR | Status: AC
  Filled 2023-01-20: qty 20

## 2023-01-20 MED ORDER — ONDANSETRON HCL 4 MG/2ML IJ SOLN
INTRAMUSCULAR | Status: AC
  Filled 2023-01-20: qty 2

## 2023-01-20 MED ORDER — VASHE WOUND IRRIGATION OPTIME
TOPICAL | Status: DC | PRN
  Administered 2023-01-20: 34 [oz_av]

## 2023-01-20 MED ORDER — LIDOCAINE 2% (20 MG/ML) 5 ML SYRINGE
INTRAMUSCULAR | Status: AC
  Filled 2023-01-20: qty 5

## 2023-01-20 MED ORDER — MIDAZOLAM HCL 2 MG/2ML IJ SOLN
INTRAMUSCULAR | Status: AC
  Filled 2023-01-20: qty 2

## 2023-01-20 MED ORDER — SODIUM CHLORIDE 0.9 % IV SOLN
2.0000 g | Freq: Three times a day (TID) | INTRAVENOUS | Status: DC
  Administered 2023-01-20 – 2023-01-24 (×11): 2 g via INTRAVENOUS
  Filled 2023-01-20 (×11): qty 12.5

## 2023-01-20 MED ORDER — MIDAZOLAM HCL 2 MG/2ML IJ SOLN
INTRAMUSCULAR | Status: DC | PRN
  Administered 2023-01-20: 2 mg via INTRAVENOUS

## 2023-01-20 MED ORDER — CEFAZOLIN SODIUM-DEXTROSE 2-3 GM-%(50ML) IV SOLR
INTRAVENOUS | Status: DC | PRN
  Administered 2023-01-20: 2 g via INTRAVENOUS

## 2023-01-20 MED ORDER — PHENYLEPHRINE 80 MCG/ML (10ML) SYRINGE FOR IV PUSH (FOR BLOOD PRESSURE SUPPORT)
PREFILLED_SYRINGE | INTRAVENOUS | Status: DC | PRN
  Administered 2023-01-20: 160 ug via INTRAVENOUS

## 2023-01-20 MED ORDER — ONDANSETRON HCL 4 MG/2ML IJ SOLN
INTRAMUSCULAR | Status: DC | PRN
  Administered 2023-01-20: 4 mg via INTRAVENOUS

## 2023-01-20 MED ORDER — PROPOFOL 10 MG/ML IV BOLUS
INTRAVENOUS | Status: DC | PRN
  Administered 2023-01-20: 130 mg via INTRAVENOUS

## 2023-01-20 MED ORDER — ACETAMINOPHEN 500 MG PO TABS
1000.0000 mg | ORAL_TABLET | Freq: Four times a day (QID) | ORAL | Status: DC
  Administered 2023-01-20 – 2023-01-23 (×9): 1000 mg via ORAL
  Filled 2023-01-20 (×9): qty 2

## 2023-01-20 MED ORDER — DEXAMETHASONE SODIUM PHOSPHATE 10 MG/ML IJ SOLN
INTRAMUSCULAR | Status: DC | PRN
  Administered 2023-01-20: 4 mg via INTRAVENOUS

## 2023-01-20 MED ORDER — FENTANYL CITRATE (PF) 250 MCG/5ML IJ SOLN
INTRAMUSCULAR | Status: DC | PRN
  Administered 2023-01-20: 50 ug via INTRAVENOUS

## 2023-01-20 MED ORDER — CHLORHEXIDINE GLUCONATE CLOTH 2 % EX PADS
6.0000 | MEDICATED_PAD | Freq: Every day | CUTANEOUS | Status: DC
  Administered 2023-01-20 – 2023-01-23 (×4): 6 via TOPICAL

## 2023-01-20 MED ORDER — SENNOSIDES-DOCUSATE SODIUM 8.6-50 MG PO TABS
1.0000 | ORAL_TABLET | Freq: Every day | ORAL | Status: DC
  Administered 2023-01-20 – 2023-01-22 (×3): 1 via ORAL
  Filled 2023-01-20 (×3): qty 1

## 2023-01-20 MED ORDER — PHENYLEPHRINE 80 MCG/ML (10ML) SYRINGE FOR IV PUSH (FOR BLOOD PRESSURE SUPPORT)
PREFILLED_SYRINGE | INTRAVENOUS | Status: AC
  Filled 2023-01-20: qty 10

## 2023-01-20 MED ORDER — PANTOPRAZOLE SODIUM 40 MG PO TBEC
40.0000 mg | DELAYED_RELEASE_TABLET | Freq: Every day | ORAL | Status: DC
  Administered 2023-01-21 – 2023-02-04 (×14): 40 mg via ORAL
  Filled 2023-01-20 (×13): qty 1

## 2023-01-20 MED ORDER — PROPOFOL 10 MG/ML IV BOLUS
INTRAVENOUS | Status: AC
  Filled 2023-01-20: qty 20

## 2023-01-20 MED ORDER — ENOXAPARIN SODIUM 40 MG/0.4ML IJ SOSY
40.0000 mg | PREFILLED_SYRINGE | Freq: Every day | INTRAMUSCULAR | Status: DC
  Administered 2023-01-21 – 2023-02-04 (×13): 40 mg via SUBCUTANEOUS
  Filled 2023-01-20 (×12): qty 0.4

## 2023-01-20 SURGICAL SUPPLY — 49 items
APL SKNCLS STERI-STRIP NONHPOA (GAUZE/BANDAGES/DRESSINGS) ×2
ATTRACTOMAT 16X20 MAGNETIC DRP (DRAPES) ×1 IMPLANT
BENZOIN TINCTURE PRP APPL 2/3 (GAUZE/BANDAGES/DRESSINGS) IMPLANT
BLADE SURG 10 STRL SS (BLADE) ×1 IMPLANT
BLADE SURG 15 STRL LF DISP TIS (BLADE) IMPLANT
BLADE SURG 15 STRL SS (BLADE)
BNDG GAUZE DERMACEA FLUFF 4 (GAUZE/BANDAGES/DRESSINGS) IMPLANT
BNDG GZE DERMACEA 4 6PLY (GAUZE/BANDAGES/DRESSINGS)
CANISTER SUCT 3000ML PPV (MISCELLANEOUS) ×1 IMPLANT
CANISTER WOUND CARE 500ML ATS (WOUND CARE) ×1 IMPLANT
CLEANSER WND VASHE INSTL 34OZ (WOUND CARE) IMPLANT
CLIP TI WIDE RED SMALL 24 (CLIP) IMPLANT
CNTNR URN SCR LID CUP LEK RST (MISCELLANEOUS) IMPLANT
CONN Y 3/8X3/8X3/8  BEN (MISCELLANEOUS)
CONN Y 3/8X3/8X3/8 BEN (MISCELLANEOUS) IMPLANT
CONT SPEC 4OZ STRL OR WHT (MISCELLANEOUS) ×1
COVER SURGICAL LIGHT HANDLE (MISCELLANEOUS) ×2 IMPLANT
DRAPE DERMATAC (DRAPES) IMPLANT
DRAPE LAPAROSCOPIC ABDOMINAL (DRAPES) ×1 IMPLANT
DRAPE SLUSH/WARMER DISC (DRAPES) IMPLANT
DRAPE WARM FLUID 44X44 (DRAPES) IMPLANT
DRSG AQUACEL AG ADV 3.5X14 (GAUZE/BANDAGES/DRESSINGS) ×1 IMPLANT
DRSG VAC GRANUFOAM LG (GAUZE/BANDAGES/DRESSINGS) ×1 IMPLANT
GAUZE PAD ABD 8X10 STRL (GAUZE/BANDAGES/DRESSINGS) IMPLANT
GLOVE BIO SURGEON STRL SZ7.5 (GLOVE) ×2 IMPLANT
GLOVE SURG SS PI 7.0 STRL IVOR (GLOVE) IMPLANT
GOWN STRL REUS W/ TWL LRG LVL3 (GOWN DISPOSABLE) ×4 IMPLANT
GOWN STRL REUS W/TWL LRG LVL3 (GOWN DISPOSABLE) ×1
GOWN STRL REUS W/TWL XL LVL3 (GOWN DISPOSABLE) IMPLANT
HANDPIECE INTERPULSE COAX TIP (DISPOSABLE) ×1
HEMOSTAT POWDER SURGIFOAM 1G (HEMOSTASIS) IMPLANT
HEMOSTAT SURGICEL 2X14 (HEMOSTASIS) IMPLANT
KIT BASIN OR (CUSTOM PROCEDURE TRAY) ×1 IMPLANT
KIT TURNOVER KIT B (KITS) ×1 IMPLANT
NS IRRIG 1000ML POUR BTL (IV SOLUTION) ×1 IMPLANT
PACK GENERAL/GYN (CUSTOM PROCEDURE TRAY) ×1 IMPLANT
PAD ARMBOARD 7.5X6 YLW CONV (MISCELLANEOUS) ×2 IMPLANT
SET HNDPC FAN SPRY TIP SCT (DISPOSABLE) ×1 IMPLANT
SOL PREP POV-IOD 4OZ 10% (MISCELLANEOUS) IMPLANT
SPONGE T-LAP 18X18 ~~LOC~~+RFID (SPONGE) IMPLANT
STAPLER VISISTAT 35W (STAPLE) IMPLANT
SUT VIC AB 2-0 CTX 27 (SUTURE) ×2 IMPLANT
SUT VIC AB 2-0 CTX 36 (SUTURE) IMPLANT
SUT VIC AB 3-0 X1 27 (SUTURE) ×2 IMPLANT
SWAB COLLECTION DEVICE MRSA (MISCELLANEOUS) IMPLANT
SWAB CULTURE ESWAB REG 1ML (MISCELLANEOUS) IMPLANT
TOWEL GREEN STERILE (TOWEL DISPOSABLE) ×1 IMPLANT
TOWEL GREEN STERILE FF (TOWEL DISPOSABLE) ×1 IMPLANT
WATER STERILE IRR 1000ML POUR (IV SOLUTION) ×1 IMPLANT

## 2023-01-20 NOTE — Progress Notes (Signed)
OT Cancellation Note  Patient Details Name: Bryan Brock MRN: [id number] DOB: 1956/01/19   Cancelled Treatment:    Reason Eval/Treat Not Completed: Other (comment).  Spoke with primary RN, advised MD wanting patient to wait for OOB or chair position later this evening post procedure.  OT to continue efforts as appropriate.    Aneyah Lortz D Srishti Strnad 01/20/2023, 3:39 PM 01/20/2023  RP, OTR/L  Acute Rehabilitation Services  Office:  [phone]

## 2023-01-20 NOTE — Progress Notes (Signed)
PT Cancellation Note  Patient Details Name: Bryan Brock MRN: [id number] DOB: 05/13/1956   Cancelled Treatment:    Reason Eval/Treat Not Completed: (P) Patient at procedure or test/unavailable Pt is off floor for placement of sternal Wound Vac. PT will follow back for treatment as able.  Tila Millirons B. Beverely Risen PT, DPT Acute Rehabilitation Services Please use secure chat or  Call Office [phone]    [name] [facility] 01/20/2023, 8:23 AM

## 2023-01-20 NOTE — Anesthesia Postprocedure Evaluation (Signed)
Anesthesia Post Note  Patient: Bryan Brock  Procedure(s) Performed: Horace Porteous STERNAL INFECTION APPLICATION OF WOUND VAC     Patient location during evaluation: PACU Anesthesia Type: General Level of consciousness: awake and alert Pain management: pain level controlled Vital Signs Assessment: post-procedure vital signs reviewed and stable Respiratory status: spontaneous breathing, nonlabored ventilation, respiratory function stable and patient connected to nasal cannula oxygen Cardiovascular status: blood pressure returned to baseline and stable Postop Assessment: no apparent nausea or vomiting Anesthetic complications: no  No notable events documented.  Last Vitals:  Vitals:   01/20/23 1200 01/20/23 1300  BP: (!) 94/40 97/63  Pulse: 68 65  Resp: (!) 23 16  Temp:    SpO2: 97%     Last Pain:  Vitals:   01/20/23 1200  TempSrc:   PainSc: 0-No pain                 Shelton Silvas

## 2023-01-20 NOTE — Anesthesia Procedure Notes (Signed)
Procedure Name: LMA Insertion Date/Time: 01/20/2023 7:47 AM  Performed by: De Nurse, CRNAPre-anesthesia Checklist: Patient identified, Emergency Drugs available, Suction available and Patient being monitored Patient Re-evaluated:Patient Re-evaluated prior to induction Oxygen Delivery Method: Circle System Utilized Preoxygenation: Pre-oxygenation with 100% oxygen Induction Type: IV induction Ventilation: Mask ventilation without difficulty LMA: LMA inserted LMA Size: 5.0 Number of attempts: 1 Placement Confirmation: positive ETCO2 Tube secured with: Tape Dental Injury: Teeth and Oropharynx as per pre-operative assessment

## 2023-01-20 NOTE — Anesthesia Preprocedure Evaluation (Signed)
Anesthesia Evaluation  Patient identified by MRN, date of birth, ID band Patient awake    Reviewed: Allergy & Precautions, NPO status , Patient's Chart, lab work & pertinent test results  History of Anesthesia Complications (+) PONV and history of anesthetic complications  Airway Mallampati: III  TM Distance: >3 FB Neck ROM: Full    Dental  (+) Teeth Intact, Dental Advisory Given   Pulmonary asthma , sleep apnea    breath sounds clear to auscultation       Cardiovascular hypertension, + CAD, + CABG and + Peripheral Vascular Disease  + dysrhythmias + Valvular Problems/Murmurs MVP  Rhythm:Regular Rate:Normal  Echo: 1. Left ventricular ejection fraction, by estimation, is 55 to 60%. The  left ventricle has normal function. The left ventricle has no regional  wall motion abnormalities. There is mild concentric left ventricular  hypertrophy. Left ventricular diastolic  parameters are consistent with Grade I diastolic dysfunction (impaired  relaxation). Elevated left atrial pressure.   2. Right ventricular systolic function is normal. The right ventricular  size is normal.   3. Left atrial size was moderately dilated.   4. The mitral valve is normal in structure. No evidence of mitral valve  regurgitation.   5. The aortic valve is tricuspid. Aortic valve regurgitation is not  visualized. No aortic stenosis is present.     Neuro/Psych  PSYCHIATRIC DISORDERS Anxiety Depression Bipolar Disorder      GI/Hepatic hiatal hernia,GERD  ,,(+) Hepatitis -  Endo/Other  negative endocrine ROS    Renal/GU negative Renal ROS     Musculoskeletal  (+) Arthritis ,    Abdominal   Peds  Hematology negative hematology ROS (+)   Anesthesia Other Findings   Reproductive/Obstetrics                             Anesthesia Physical Anesthesia Plan  ASA: 3  Anesthesia Plan: General   Post-op Pain Management:     Induction: Intravenous  PONV Risk Score and Plan: 3 and Ondansetron, Midazolam and Treatment may vary due to age or medical condition  Airway Management Planned: LMA  Additional Equipment:   Intra-op Plan:   Post-operative Plan: Extubation in OR  Informed Consent: I have reviewed the patients History and Physical, chart, labs and discussed the procedure including the risks, benefits and alternatives for the proposed anesthesia with the patient or authorized representative who has indicated his/her understanding and acceptance.     Dental advisory given  Plan Discussed with: CRNA  Anesthesia Plan Comments:        Anesthesia Quick Evaluation

## 2023-01-20 NOTE — Progress Notes (Signed)
PT Cancellation Note  Patient Details Name: Bryan Brock MRN: [id number] DOB: [birth year]   Cancelled Treatment:    Reason Eval/Treat Not Completed: (P) Patient not medically ready Per OT, MD wanting patient to wait for OOB or chair position later this evening post procedure. PT will follow back tomorrow for Re-Evaluation as appropriate.  Jacarra Bobak B. Beverely Risen PT, DPT Acute Rehabilitation Services Please use secure chat or  Call Office [phone]    [name] [facility] 01/20/2023, 4:30 PM

## 2023-01-20 NOTE — Transfer of Care (Signed)
Immediate Anesthesia Transfer of Care Note  Patient: Bryan Brock  Procedure(s) Performed: Horace Porteous STERNAL INFECTION APPLICATION OF WOUND VAC  Patient Location: PACU  Anesthesia Type:General  Level of Consciousness: awake, alert , and oriented  Airway & Oxygen Therapy: Patient Spontanous Breathing and Patient connected to nasal cannula oxygen  Post-op Assessment: Report given to RN  Post vital signs: Reviewed and stable  Last Vitals:  Vitals Value Taken Time  BP 133/75   Temp    Pulse 76 01/20/23 0901  Resp 24 01/20/23 0901  SpO2 96 % 01/20/23 0901  Vitals shown include unvalidated device data.  Last Pain:  Vitals:   01/20/23 0215  TempSrc: Oral  PainSc:       Patients Stated Pain Goal: 0 (01/19/23 2134)  Complications: No notable events documented.

## 2023-01-20 NOTE — Progress Notes (Signed)
     [address]       [place] [address]             [phone]       EVENING ROUNDS  Stable after wound drainage and vac placement Awaiting cultures

## 2023-01-20 NOTE — Progress Notes (Signed)
Pre Procedure note for inpatients:   PAT Bryan Brock has been scheduled for Procedure(s): DRAIN STERNAL INFECTION (N/A) APPLICATION OF WOUND VAC (N/A) today. The various methods of treatment have been discussed with the patient. After consideration of the risks, benefits and treatment options the patient has consented to the planned procedure.   The patient has been seen and labs reviewed. There are no changes in the patient's condition to prevent proceeding with the planned procedure today.  Recent labs:  Lab Results  Component Value Date   WBC 8.7 01/19/2023   HGB 10.0 (L) 01/19/2023   HCT 31.6 (L) 01/19/2023   PLT 557 (H) 01/19/2023   GLUCOSE 120 (H) 01/20/2023   CHOL 174 10/28/2022   TRIG 140 10/28/2022   HDL 46 10/28/2022   LDLCALC 103 (H) 10/28/2022   ALT 63 (H) 12/31/2022   AST 50 (H) 12/31/2022   NA 132 (L) 01/20/2023   K 3.2 (L) 01/20/2023   CL 93 (L) 01/20/2023   CREATININE 1.31 (H) 01/20/2023   BUN 25 (H) 01/20/2023   CO2 28 01/20/2023   TSH 1.40 10/17/2021   INR 1.3 (H) 01/02/2023   HGBA1C 6.6 (H) 12/31/2022    Lovett Sox, MD 01/20/2023 7:23 AM

## 2023-01-20 NOTE — Progress Notes (Signed)
Pharmacy Antibiotic Note  Bryan Brock is a 67 y.o. male admitted on 01/02/2023 with  sternal wound infection .  Pharmacy has been consulted for vanc dosing.  Pt is s/p CABG and sternal wound infection s/p re-wiring. Due to increased drainage s/p thoracentesis and then washout / I&D and wound vac placed 5/27  Empiric antibiotics with vancomycin and cefepime have been started  Scr 1.3 crcl > 174ml/min wbc wnl, afebrile    Plan: Vanc 1.5g IV q24h >>AUC 483, scr 1.3 Cefepime 2gm IV q8hr   Height: 5\' 11"  (180.3 cm) Weight: 114.2 kg (251 lb 12.3 oz) IBW/kg (Calculated) : 75.3  Temp (24hrs), Avg:98.1 F (36.7 C), Min:97.7 F (36.5 C), Max:98.8 F (37.1 C)  Recent Labs  Lab 01/14/23 0147 01/14/23 0358 01/15/23 0142 01/16/23 0800 01/18/23 0107 01/19/23 0137 01/19/23 1408 01/20/23 0142  WBC 10.9*  --  11.4* 8.9  --   --  8.7  --   CREATININE  --    < > 1.32* 1.14 1.17 1.25*  --  1.31*   < > = values in this interval not displayed.     Estimated Creatinine Clearance: 69.4 mL/min (A) (by C-G formula based on SCr of 1.31 mg/dL (H)).    Allergies  Allergen Reactions   Latex Dermatitis   Lisinopril Cough    Antimicrobials this admission: 5/23 keflex>>5/26 5/26 vanc>> 5/27 cefepime  Dose adjustments this admission:   Microbiology results: 5/26 body fluid>> 5/26 AFB>>    Leota Sauers Pharm.D. CPP, BCPS Clinical Pharmacist [phone] 01/20/2023 1:57 PM

## 2023-01-20 NOTE — Brief Op Note (Addendum)
01/20/2023  8:56 AM  PATIENT:  Bryan Brock  67 y.o. male  PRE-OPERATIVE DIAGNOSIS:  s/p  CABG, sternal dehiscence  POST-OPERATIVE DIAGNOSIS:  s/p CABG, sternal dehiscence  PROCEDURE:  Drainage of sub-sternal , chest wall fluid collection and placement of wound VAC.  SURGEON:  Surgeon(s) and Role:    Lovett Sox, MD - Primary  PHYSICIAN ASSISTANT:   ASSISTANTS: RN   ANESTHESIA:   general  EBL:  5 ml  BLOOD ADMINISTERED:none  DRAINS: none   LOCAL MEDICATIONS USED:  NONE  SPECIMEN:  Lavage/Washing sternal wound  DISPOSITION OF SPECIMEN:   microbiology lab  COUNTS:  YES  TOURNIQUET:  * No tourniquets in log *  DICTATION: .Dragon Dictation  PLAN OF CARE:  transfer to ICU  PATIENT DISPOSITION:  PACU - hemodynamically stable.   Delay start of Pharmacological VTE agent (>24hrs) due to surgical blood loss or risk of bleeding: yes

## 2023-01-20 NOTE — Op Note (Signed)
NAME: Bryan Brock, ANNEN MEDICAL RECORD NO: [id number] ACCOUNT NO: [id number] DATE OF BIRTH: [birth year] FACILITY: [facility] LOCATION: [facility] PHYSICIAN: Kerin Perna III, MD  Operative Report   DATE OF PROCEDURE: 01/20/2023  OPERATION: Incision and drainage of peristernal fluid collection extending into the left chest wall with placement of wound VAC.  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  SURGEON:  Kerin Perna, MD  ANESTHESIA:  General.  DESCRIPTION OF PROCEDURE:  The patient was evaluated in preoperative holding where informed consent was documented and final details regarding surgery were discussed with the patient and his wife.  The patient was then brought back to the OR by the  anesthesia team.  The patient was placed supine on the operating table and general anesthesia was induced and the patient was intubated.  He remained stable.  The patient was positioned and the chest was then prepped and draped as a sterile field.  A  proper timeout was performed.  An incision was made in the lower third of the sternal incision where there was a defect in the wound and the separation of the sternum with underlying fluid.  After making a small incision in the skin, a large amount of serosanguineous fluid under  pressure drained out.  This was sent for cultures, both aerobic and anaerobic.  The incision was extended to extend to approximately the lower half of the total incision.  The wound was examined.  There was complete separation of the lower sternal edges,  measuring approximately 6 cm.  The opening in the skin  incision  was approximately 15 cm.  There were 2 lower sternal wires which had cut through the left side and were free and these were removed.  The lower portion of the weave previously placed in this area  was also in a free space due to the  lower wires pulling through and the weave wire was modified, so that it was more secure and providing support to the left side of the remaining wires.  The pocket of fluid above the ribs under the chest wall musculature was completely drained as was the substernal collection.  The pseudo-pericardium formed over the heart was intact and the heart was not visible nor were any of the bypass grafts.  I then did a  pulse lavage irrigation using Vashe wound solution of the wound.  After suctioning the field and removing some fibrinous debris, two wound VAC sponges were placed.  The first was placed in the space between the left anterior ribs and the chest wall  musculature where the fluid had collected.  This extended into the midline to be in contact with the main wound VAC sponge in the lower half of the sternal incision extending down to the pseudo-pericardium.  Next, the wound VAC sheeets were placed over  the lower sternal incision, which measured 15 cm long by 4 to 5 cm wide.  An opening was created and the wound VAC suction was applied.  The wound VAC suction seal was intact and the sponge compressed well.  The patient was then reversed from anesthesia,  extubated and was stable.  He was taken to the recovery room  in stable condition.   SHW D: 01/20/2023 10:04:23 am T: 01/20/2023 10:27:00 am  JOB: [id number]/ [id number]

## 2023-01-21 ENCOUNTER — Encounter (HOSPITAL_COMMUNITY): Payer: Self-pay | Admitting: Cardiothoracic Surgery

## 2023-01-21 LAB — CBC
HCT: 29.1 % — ABNORMAL LOW (ref 39.0–52.0)
Hemoglobin: 9.1 g/dL — ABNORMAL LOW (ref 13.0–17.0)
MCH: 29.5 pg (ref 26.0–34.0)
MCHC: 31.3 g/dL (ref 30.0–36.0)
MCV: 94.5 fL (ref 80.0–100.0)
Platelets: 496 10*3/uL — ABNORMAL HIGH (ref 150–400)
RBC: 3.08 MIL/uL — ABNORMAL LOW (ref 4.22–5.81)
RDW: 14 % (ref 11.5–15.5)
WBC: 9.5 10*3/uL (ref 4.0–10.5)
nRBC: 0 % (ref 0.0–0.2)

## 2023-01-21 LAB — BASIC METABOLIC PANEL
Anion gap: 11 (ref 5–15)
BUN: 29 mg/dL — ABNORMAL HIGH (ref 8–23)
CO2: 29 mmol/L (ref 22–32)
Calcium: 7.5 mg/dL — ABNORMAL LOW (ref 8.9–10.3)
Chloride: 90 mmol/L — ABNORMAL LOW (ref 98–111)
Creatinine, Ser: 1.32 mg/dL — ABNORMAL HIGH (ref 0.61–1.24)
GFR, Estimated: 59 mL/min — ABNORMAL LOW (ref >60)
Glucose, Bld: 135 mg/dL — ABNORMAL HIGH (ref 70–99)
Potassium: 3.4 mmol/L — ABNORMAL LOW (ref 3.5–5.1)
Sodium: 130 mmol/L — ABNORMAL LOW (ref 135–145)

## 2023-01-21 LAB — AEROBIC/ANAEROBIC CULTURE W GRAM STAIN (SURGICAL/DEEP WOUND): Gram Stain: NONE SEEN

## 2023-01-21 LAB — GLUCOSE, CAPILLARY
Glucose-Capillary: 119 mg/dL — ABNORMAL HIGH (ref 70–99)
Glucose-Capillary: 160 mg/dL — ABNORMAL HIGH (ref 70–99)
Glucose-Capillary: 124 mg/dL — ABNORMAL HIGH (ref 70–99)
Glucose-Capillary: 125 mg/dL — ABNORMAL HIGH (ref 70–99)

## 2023-01-21 LAB — BODY FLUID CULTURE W GRAM STAIN: Gram Stain: NONE SEEN

## 2023-01-21 MED ORDER — POTASSIUM CHLORIDE CRYS ER 20 MEQ PO TBCR
20.0000 meq | EXTENDED_RELEASE_TABLET | Freq: Two times a day (BID) | ORAL | Status: DC
  Administered 2023-01-21: 20 meq via ORAL
  Filled 2023-01-21: qty 1

## 2023-01-21 MED ORDER — FUROSEMIDE 10 MG/ML IJ SOLN
40.0000 mg | Freq: Every day | INTRAMUSCULAR | Status: DC
  Administered 2023-01-21: 40 mg via INTRAVENOUS
  Filled 2023-01-21: qty 4

## 2023-01-21 MED ORDER — POTASSIUM CHLORIDE CRYS ER 20 MEQ PO TBCR
20.0000 meq | EXTENDED_RELEASE_TABLET | ORAL | Status: AC
  Administered 2023-01-21 (×3): 20 meq via ORAL
  Filled 2023-01-21 (×3): qty 1

## 2023-01-21 MED ORDER — METOLAZONE 5 MG PO TABS
5.0000 mg | ORAL_TABLET | Freq: Every day | ORAL | Status: DC
  Administered 2023-01-21: 5 mg via ORAL
  Filled 2023-01-21: qty 1

## 2023-01-21 MED ORDER — SORBITOL 70 % SOLN
60.0000 mL | Freq: Once | Status: AC
  Administered 2023-01-21: 60 mL via ORAL
  Filled 2023-01-21: qty 60

## 2023-01-21 NOTE — Progress Notes (Signed)
Occupational Therapy Treatment Patient Details Name: Bryan Brock MRN: [id number] DOB: Nov 28, 1955 Today's Date: 01/21/2023   History of present illness 67 yo male admitted 5/9 for CABG x 4. 5/14 sternal rewiring due to dehiscence coughing. 5/27 sternal wound I&D with VAC placement. PMhx: HTN, HLD, bipolar d/o, obesity, sleep apnea, mitral valve prolapse, GERD   OT comments  Pt progressing towards goals this session, needing min-total A for ADLs, pt with good recall of sternal precautions, however needs min A to adhere during session. Pt min guard-min A for bed mobility, min A for transfers with RW. Pt using heart pillow to brace while coughing and having BM. Discussed trial of AE for future sessions as pt with difficulty reaching towards feet and performing posterior pericare. Pt presenting with impairments listed below, will follow acutely. Continue to recommend HHOT at d/c.    Recommendations for follow up therapy are one component of a multi-disciplinary discharge planning process, led by the attending physician.  Recommendations may be updated based on patient status, additional functional criteria and insurance authorization.    Assistance Recommended at Discharge Intermittent Supervision/Assistance  Patient can return home with the following  Assist for transportation;Assistance with cooking/housework;A lot of help with bathing/dressing/bathroom;A little help with walking and/or transfers   Equipment Recommendations  None recommended by OT    Recommendations for Other Services      Precautions / Restrictions Precautions Precautions: Sternal;Fall;Other (comment) Precaution Booklet Issued: No Precaution Comments: watch SPO2, mod cues to maintain precautions particularly with scooting, vAC Restrictions Weight Bearing Restrictions: Yes       Mobility Bed Mobility Overal bed mobility: Needs Assistance Bed Mobility: Rolling, Sidelying to Sit Rolling: Min guard     Sit to  supine: Min assist        Transfers Overall transfer level: Needs assistance Equipment used: Rolling walker (2 wheels) Transfers: Sit to/from Stand Sit to Stand: Min assist     Step pivot transfers: Min assist           Balance Overall balance assessment: Needs assistance Sitting-balance support: No upper extremity supported, Feet supported Sitting balance-Leahy Scale: Good Sitting balance - Comments: EOb without support   Standing balance support: Bilateral upper extremity supported, During functional activity Standing balance-Leahy Scale: Fair Standing balance comment: can stand without UB support, RW for gait                           ADL either performed or assessed with clinical judgement   ADL Overall ADL's : Needs assistance/impaired     Grooming: Oral care;Standing Grooming Details (indicate cue type and reason): cues to maintain sternal prec                 Toilet Transfer: Minimal assistance;Ambulation;Rolling walker (2 wheels);Regular Teacher, adult education Details (indicate cue type and reason): cues for technique Toileting- Clothing Manipulation and Hygiene: Total assistance;Sitting/lateral lean Toileting - Clothing Manipulation Details (indicate cue type and reason): posterior pericare in standing     Functional mobility during ADLs: Minimal assistance;Rolling walker (2 wheels)      Extremity/Trunk Assessment Upper Extremity Assessment Upper Extremity Assessment: Overall WFL for tasks assessed   Lower Extremity Assessment Lower Extremity Assessment: Defer to PT evaluation        Vision   Vision Assessment?: No apparent visual deficits   Perception Perception Perception: Within Functional Limits   Praxis Praxis Praxis: Intact    Cognition Arousal/Alertness: Awake/alert Behavior During Therapy: [facility] for  tasks assessed/performed Overall Cognitive Status: Within Functional Limits for tasks assessed                                  General Comments: good awareness of sternal precautions        Exercises      Shoulder Instructions       General Comments Cues for sternal prec    Pertinent Vitals/ Pain       Pain Assessment Pain Assessment: Faces Pain Score: 2  Faces Pain Scale: Hurts a little bit Pain Location: sternal incision Pain Descriptors / Indicators: Discomfort Pain Intervention(s): Limited activity within patient's tolerance, Monitored during session, Repositioned  Home Living                                          Prior Functioning/Environment              Frequency  Min 1X/week        Progress Toward Goals  OT Goals(current goals can now be found in the care plan section)  Progress towards OT goals: Progressing toward goals  Acute Rehab OT Goals Patient Stated Goal: none stated OT Goal Formulation: With patient Time For Goal Achievement: 01/24/23 Potential to Achieve Goals: Good ADL Goals Pt Will Perform Grooming: with supervision;standing Pt Will Perform Upper Body Dressing: with supervision;sitting Pt Will Perform Lower Body Dressing: with min assist;sit to/from stand Pt Will Transfer to Toilet: with supervision;ambulating;regular height toilet Pt/caregiver will Perform Home Exercise Program: Increased ROM;Both right and left upper extremity  Plan Discharge plan remains appropriate    Co-evaluation                 AM-PAC OT "6 Clicks" Daily Activity     Outcome Measure   Help from another person eating meals?: None Help from another person taking care of personal grooming?: None Help from another person toileting, which includes using toliet, bedpan, or urinal?: A Lot Help from another person bathing (including washing, rinsing, drying)?: A Lot Help from another person to put on and taking off regular upper body clothing?: A Lot Help from another person to put on and taking off regular lower body clothing?: A Lot 6  Click Score: 16    End of Session Equipment Utilized During Treatment: Rolling walker (2 wheels);Oxygen (1L)  OT Visit Diagnosis: Unsteadiness on feet (R26.81);Muscle weakness (generalized) (M62.81)   Activity Tolerance Patient limited by fatigue   Patient Left in bed;with call bell/phone within reach;with bed alarm set   Nurse Communication Mobility status (toileting)        Time: 1610-9604 OT Time Calculation (min): 38 min  Charges: OT General Charges $OT Visit: 1 Visit OT Treatments $Self Care/Home Management : 23-37 mins $Therapeutic Activity: 8-22 mins  Carver Fila, OTD, OTR/L SecureChat Preferred Acute Rehab [phone]   Carver Fila Koonce 01/21/2023, 2:04 PM

## 2023-01-21 NOTE — Progress Notes (Signed)
Physical Therapy Treatment Patient Details Name: Bryan Brock MRN: [id number] DOB: [birth year] Today's Date: 01/21/2023   History of Present Illness 67 yo male admitted 5/9 for CABG x 4. 5/14 sternal rewiring due to dehiscence coughing. 5/27 sternal wound I&D with VAC placement. PMhx: HTN, HLD, bipolar d/o, obesity, sleep apnea, mitral valve prolapse, GERD    PT Comments    Pt pleasant and wanting reassurance that he can move post [facility] placement with therapist and RN reassuring pt. Pt able to recall all precautions today and demonstrating improved adherence but still struggles to maintain with scooting in chair. Pt with improved adherence to decrease talking with talking to maintain SPO2 and repeated sit to stand trials performed. Will continue to follow.   SpO2 90-94% on RA HR 73-84 Pre gait 109/51 (69) Post gait 130/66 (81)    Recommendations for follow up therapy are one component of a multi-disciplinary discharge planning process, led by the attending physician.  Recommendations may be updated based on patient status, additional functional criteria and insurance authorization.  Follow Up Recommendations       Assistance Recommended at Discharge Intermittent Supervision/Assistance  Patient can return home with the following A little help with walking and/or transfers;A little help with bathing/dressing/bathroom;Assistance with cooking/housework;Assist for transportation;Help with stairs or ramp for entrance   Equipment Recommendations  Rolling walker (2 wheels) (bari)    Recommendations for Other Services       Precautions / Restrictions Precautions Precautions: Sternal;Fall;Other (comment) Precaution Comments: watch SPO2, mod cues to maintain precautions particularly with scooting, vAC Restrictions Weight Bearing Restrictions: Yes     Mobility  Bed Mobility Overal bed mobility: Needs Assistance Bed Mobility: Rolling, Sidelying to Sit Rolling: Min guard Sidelying to  sit: Min assist       General bed mobility comments: cues for sequence with pt able to roll to right without significant assist, min assist to lift trunk from surface    Transfers Overall transfer level: Needs assistance   Transfers: Sit to/from Stand Sit to Stand: Min guard           General transfer comment: cues for hand placement and precautions. Pt stood from bed then total of 10 trials from chair. cues x 1 not to push with arms on chair    Ambulation/Gait Ambulation/Gait assistance: Min guard Gait Distance (Feet): 300 Feet Assistive device: Rolling walker (2 wheels) Gait Pattern/deviations: Step-through pattern, Decreased stride length   Gait velocity interpretation: 1.31 - 2.62 ft/sec, indicative of limited community ambulator   General Gait Details: cues for posture, proximity to RW and safety. Mod cues to maintain focus on breathing and not talking with gait   Stairs             Wheelchair Mobility    Modified Rankin (Stroke Patients Only)       Balance Overall balance assessment: Needs assistance   Sitting balance-Leahy Scale: Good Sitting balance - Comments: EOb without support   Standing balance support: Bilateral upper extremity supported, During functional activity Standing balance-Leahy Scale: Fair Standing balance comment: can stand without UB support, RW for gait                            Cognition Arousal/Alertness: Awake/alert Behavior During Therapy: WFL for tasks assessed/performed Overall Cognitive Status: Within Functional Limits for tasks assessed  General Comments: able to recall all precautions, overall min assist to maintain them. mod cues for breathing technique to maintain SPO2        Exercises      General Comments        Pertinent Vitals/Pain Pain Assessment Pain Score: 2  Pain Location: sternal incision Pain Descriptors / Indicators: Sore Pain  Intervention(s): Limited activity within patient's tolerance, Monitored during session, Repositioned    Home Living                          Prior Function            PT Goals (current goals can now be found in the care plan section) Progress towards PT goals: Progressing toward goals    Frequency    Min 1X/week      PT Plan Current plan remains appropriate    Co-evaluation              AM-PAC PT "6 Clicks" Mobility   Outcome Measure  Help needed turning from your back to your side while in a flat bed without using bedrails?: A Little Help needed moving from lying on your back to sitting on the side of a flat bed without using bedrails?: A Little Help needed moving to and from a bed to a chair (including a wheelchair)?: A Little Help needed standing up from a chair using your arms (e.g., wheelchair or bedside chair)?: A Little Help needed to walk in hospital room?: A Little Help needed climbing 3-5 steps with a railing? : A Little 6 Click Score: 18    End of Session   Activity Tolerance: Patient tolerated treatment well Patient left: in chair;with call bell/phone within reach;with nursing/sitter in room Nurse Communication: Mobility status;Precautions PT Visit Diagnosis: Other abnormalities of gait and mobility (R26.89);Difficulty in walking, not elsewhere classified (R26.2)     Time: 1610-9604 PT Time Calculation (min) (ACUTE ONLY): 23 min  Charges:  $Gait Training: 8-22 mins $Therapeutic Activity: 8-22 mins                     Merryl Hacker, PT Acute Rehabilitation Services Office: [phone]    Cristine Polio 01/21/2023, 9:19 AM

## 2023-01-21 NOTE — Progress Notes (Addendum)
[address]       Bryan Brock [address]             [phone]      1 Day Post-Op Procedure(s) (LRB): DRAIN STERNAL INFECTION (N/A) APPLICATION OF WOUND VAC (N/A) Subjective: Patient states he still has some shortness of breath but feels better this AM.  Objective: Vital signs in last 24 hours: Temp:  [97.7 F (36.5 C)-98.7 F (37.1 C)] 98.3 F (36.8 C) (05/27 2304) Pulse Rate:  [65-82] 76 (05/28 0700) Cardiac Rhythm: Normal sinus rhythm (05/27 2000) Resp:  [14-26] 25 (05/28 0700) BP: (93-136)/(40-81) 115/64 (05/28 0700) SpO2:  [92 %-100 %] 98 % (05/28 0400) Weight:  [145.6 kg] 145.6 kg (05/28 0600)  Hemodynamic parameters for last 24 hours:    Intake/Output from previous day: 05/27 0701 - 05/28 0700 In: 1200 [I.V.:400; IV Piggyback:800] Out: 4260 [Urine:3950; Drains:300; Blood:10] Intake/Output this shift: No intake/output data recorded.  General appearance: alert, cooperative, and no distress Neurologic: intact Heart: regular rate and rhythm, S1, S2 normal, no murmur, click, rub or gallop Lungs: Some wheezing throughout and diminished breath sounds bibasilar  Abdomen: soft, non-tender; bowel sounds normal; no masses,  no organomegaly Extremities: edema 2+ Wound: Wound Vac in place working appropriately at inferior portion of sternal site, superior portion of sternal site is clean and dry without sign of infection  Lab Results: Recent Labs    01/19/23 1408 01/21/23 0101  WBC 8.7 9.5  HGB 10.0* 9.1*  HCT 31.6* 29.1*  PLT 557* 496*   BMET:  Recent Labs    01/20/23 0142 01/21/23 0101  NA 132* 130*  K 3.2* 3.4*  CL 93* 90*  CO2 28 29  GLUCOSE 120* 135*  BUN 25* 29*  CREATININE 1.31* 1.32*  CALCIUM 8.1* 7.5*    PT/INR: No results for input(s): "LABPROT", "INR" in the last 72 hours. ABG    Component Value Date/Time   PHART 7.399 01/08/2023 0830   HCO3 28.2 (H) 01/08/2023 0830   TCO2 30 01/08/2023 0830   ACIDBASEDEF 1.0  01/07/2023 1936   O2SAT 97 01/08/2023 0830   CBG (last 3)  Recent Labs    01/20/23 1122 01/20/23 1540 01/20/23 2112  GLUCAP 117* 150* 157*    Assessment/Plan: S/P Procedure(s) (LRB): DRAIN STERNAL INFECTION (N/A) APPLICATION OF WOUND VAC (N/A)  CV: NSR, HR 70s this AM. SBP 115. Hx of afib. Continue Amiodarone 200mg  BID and Lopressor 37.5mg  BID.   Pulm: Saturating 98-100% on 1L [place]. Left thoracentesis done on 05/26 with 50cc dark amber fluid. CXR yesterday showed stable small right pneumothorax and small to moderate bilateral pleural effusions left greater than right, no new CXR this AM. Continue Albuterol and Pulmicort. Encourage IS, flutter valve and ambulation.   GI: Good appetite, no nausea.   Endo: CBGs ok control on SSI. New diabetes diagnosis A1C 6.6, outpatient appt with PCP has been arranged.   Renal: AKI, stable Cr 1.32. Will monitor. UO 3950cc/24hrs. On Metolazone and Lasix due to interstitial edema on CXR. K 3.4, supplement.   ID: Leukocytosis resolved. WBC 9.5, afebrile. On IV Cefepime and IV Vancomycin for sternal wound. Cultures pending.    Expected postop ABLA: Last H/H stable 9.1/29.1   Hyponatremia: Improved Na 130, volume overload vs diuresis. Will monitor.    Sternal dehiscence: Chest CT on 05/26 showed increased sternal dehiscence of the lower sternum with increased fluid collection. Now POD 1 sternal wound drainage and wound vac placement. On IV  abx, cultures pending no organisms or WBC seen on gram stain from surgical wound drainage or thoracentesis. Wound VAC working properly. 350cc drainage/24hrs.    Deconditioning: Up to chair and begin ambulation today. Continue work with PT.   Dispo: Encourage up to chair and ambulation today. Dr. Maren Beach to determine wound vac changes. Tx to 4E vs continue ICU care?   LOS: 19 days    Bryan Reichmann, PA-C 01/21/2023  Patient examined, chest x-ray performed this a.m. personally reviewed. Patient feels improved  after drainage of chest wall and presternal fluid collection.  Gram stain of fluid shows 0 bacteria 0 white cells, Wound VAC output 250 cc serosanguineous fluid over past 24 hours.  Good seal check on the sponge and will continue wound VAC therapy.  Continue IV antibiotic coverage and follow-up final culture results. Patient is mobile with stable pulmonary status and will be transferred to progressive care 4E. Continue daily Lasix for fluid overload  patient examined and medical record reviewed,agree with above note. Bryan Brock 01/21/2023

## 2023-01-22 ENCOUNTER — Inpatient Hospital Stay (HOSPITAL_COMMUNITY): Payer: Medicare Other

## 2023-01-22 DIAGNOSIS — T8131XA Disruption of external operation (surgical) wound, not elsewhere classified, initial encounter: Secondary | ICD-10-CM | POA: Diagnosis not present

## 2023-01-22 LAB — CBC
HCT: 30.7 % — ABNORMAL LOW (ref 39.0–52.0)
Hemoglobin: 9.6 g/dL — ABNORMAL LOW (ref 13.0–17.0)
MCH: 30.1 pg (ref 26.0–34.0)
MCHC: 31.3 g/dL (ref 30.0–36.0)
MCV: 96.2 fL (ref 80.0–100.0)
Platelets: 470 10*3/uL — ABNORMAL HIGH (ref 150–400)
RBC: 3.19 MIL/uL — ABNORMAL LOW (ref 4.22–5.81)
RDW: 14.3 % (ref 11.5–15.5)
WBC: 9.6 10*3/uL (ref 4.0–10.5)
nRBC: 0 % (ref 0.0–0.2)

## 2023-01-22 LAB — COMPREHENSIVE METABOLIC PANEL
ALT: 24 U/L (ref 0–44)
AST: 23 U/L (ref 15–41)
Albumin: 2.4 g/dL — ABNORMAL LOW (ref 3.5–5.0)
Alkaline Phosphatase: 80 U/L (ref 38–126)
Anion gap: 10 (ref 5–15)
BUN: 31 mg/dL — ABNORMAL HIGH (ref 8–23)
CO2: 32 mmol/L (ref 22–32)
Calcium: 8.1 mg/dL — ABNORMAL LOW (ref 8.9–10.3)
Chloride: 89 mmol/L — ABNORMAL LOW (ref 98–111)
Creatinine, Ser: 1.49 mg/dL — ABNORMAL HIGH (ref 0.61–1.24)
GFR, Estimated: 51 mL/min — ABNORMAL LOW (ref >60)
Glucose, Bld: 107 mg/dL — ABNORMAL HIGH (ref 70–99)
Potassium: 3.2 mmol/L — ABNORMAL LOW (ref 3.5–5.1)
Sodium: 131 mmol/L — ABNORMAL LOW (ref 135–145)
Total Bilirubin: 1.1 mg/dL (ref 0.3–1.2)
Total Protein: 5.2 g/dL — ABNORMAL LOW (ref 6.5–8.1)

## 2023-01-22 LAB — GLUCOSE, CAPILLARY
Glucose-Capillary: 91 mg/dL (ref 70–99)
Glucose-Capillary: 146 mg/dL — ABNORMAL HIGH (ref 70–99)
Glucose-Capillary: 119 mg/dL — ABNORMAL HIGH (ref 70–99)
Glucose-Capillary: 142 mg/dL — ABNORMAL HIGH (ref 70–99)

## 2023-01-22 LAB — AEROBIC/ANAEROBIC CULTURE W GRAM STAIN (SURGICAL/DEEP WOUND)

## 2023-01-22 MED ORDER — POTASSIUM CHLORIDE CRYS ER 20 MEQ PO TBCR
20.0000 meq | EXTENDED_RELEASE_TABLET | ORAL | Status: AC
  Administered 2023-01-22 (×3): 20 meq via ORAL
  Filled 2023-01-22 (×3): qty 1

## 2023-01-22 MED ORDER — ORAL CARE MOUTH RINSE: 15.0000 mL | OROMUCOSAL | Status: DC | PRN

## 2023-01-22 MED ORDER — POTASSIUM CHLORIDE CRYS ER 20 MEQ PO TBCR
40.0000 meq | EXTENDED_RELEASE_TABLET | Freq: Once | ORAL | Status: AC
  Administered 2023-01-22: 40 meq via ORAL
  Filled 2023-01-22: qty 2

## 2023-01-22 MED ORDER — CHLORHEXIDINE GLUCONATE CLOTH 2 % EX PADS
6.0000 | MEDICATED_PAD | Freq: Once | CUTANEOUS | Status: AC
  Administered 2023-01-22: 6 via TOPICAL

## 2023-01-22 MED ORDER — CHLORHEXIDINE GLUCONATE CLOTH 2 % EX PADS: 6.0000 | MEDICATED_PAD | Freq: Once | CUTANEOUS | Status: AC

## 2023-01-22 MED ORDER — SIMETHICONE 80 MG PO CHEW
80.0000 mg | CHEWABLE_TABLET | Freq: Two times a day (BID) | ORAL | Status: DC | PRN
  Administered 2023-01-22: 80 mg via ORAL
  Filled 2023-01-22: qty 1

## 2023-01-22 MED ORDER — CEFAZOLIN IN SODIUM CHLORIDE 3-0.9 GM/100ML-% IV SOLN
3.0000 g | INTRAVENOUS | Status: AC
  Administered 2023-01-23: 3 g via INTRAVENOUS
  Filled 2023-01-22 (×2): qty 100

## 2023-01-22 NOTE — Progress Notes (Signed)
Physical Therapy Treatment Patient Details Name: Bryan Brock MRN: [id number] DOB: [birth year] Today's Date: 01/22/2023   History of Present Illness 67 yo male admitted 5/9 for CABG x 4. 5/14 sternal rewiring due to dehiscence coughing. 5/27 sternal wound I&D with VAC placement. PMhx: HTN, HLD, bipolar d/o, obesity, sleep apnea, mitral valve prolapse, GERD.    PT Comments    Pt received in recliner, agreeable to therapy session, pt noted to have pulled out his IV prior to PTA entering room (he states he was unaware, IV fluids leaking onto floor, RN notified).  Pt needing up to minA for safety with stair ascent and bed mobility, and needs increased time to initiate/perform all tasks. SpO2 WFL on RA, RN notified, pt on RA in bed at end of session. Pt reports moderate fatigue 5/10 modified RPE at end of session after stair training and short household distance gait trial. Pt due for PT goal update next session, he is making good progress toward goals, continue to recommend HHPT upon DC.  Recommendations for follow up therapy are one component of a multi-disciplinary discharge planning process, led by the attending physician.  Recommendations may be updated based on patient status, additional functional criteria and insurance authorization.  Follow Up Recommendations       Assistance Recommended at Discharge Intermittent Supervision/Assistance  Patient can return home with the following A little help with walking and/or transfers;A little help with bathing/dressing/bathroom;Assistance with cooking/housework;Assist for transportation;Help with stairs or ramp for entrance   Equipment Recommendations  Rolling walker (2 wheels);Other (comment) (bariatric size)    Recommendations for Other Services       Precautions / Restrictions Precautions Precautions: Sternal;Fall;Other (comment) Precaution Booklet Issued: No Precaution Comments: Watch SpO2, VAC Restrictions Weight Bearing Restrictions:  No Other Position/Activity Restrictions: sternal     Mobility  Bed Mobility Overal bed mobility: Needs Assistance Bed Mobility: Rolling, Sit to Sidelying Rolling: Supervision       Sit to sidelying: Min assist General bed mobility comments: LE assist to return to supine via log roll, pt states LLE pain is limiting his ability to perform this unassisted. Pt instructed on using gait belt as a leg lifter during transition to see if this would help make him more independent, but still needed assist with RLE to clear EOB.    Transfers Overall transfer level: Needs assistance Equipment used: Rolling walker (2 wheels) Transfers: Sit to/from Stand Sit to Stand: Min guard           General transfer comment: cues for hand placement and precautions. from chair height to RW then from EOB with no AD (pushing on his thighs)    Ambulation/Gait Ambulation/Gait assistance: Min guard Gait Distance (Feet): 35 Feet Assistive device: Rolling walker (2 wheels) Gait Pattern/deviations: Step-through pattern, Decreased stride length       General Gait Details: cues for posture, proximity to RW and safety; distance limited due to pt fatigue after performing stair trial in the room and recently working with mobility on hallway distance ambulation task.   Stairs Stairs: Yes Stairs assistance: Min assist, Min guard Stair Management: Step to pattern, Backwards, With walker, Forwards Number of Stairs: 5 General stair comments: backward step-up onto 7" platform step and forward step down, BUE support of RW and cues for sternal precs x2 occasions; no buckling, mostly min guard and 1 episode of minA for steadying/HHA. Pt stepping up with "better" RLE due to L foot pain this date.   Wheelchair Mobility    Modified  Rankin (Stroke Patients Only)       Balance Overall balance assessment: Needs assistance Sitting-balance support: No upper extremity supported, Feet supported Sitting balance-Leahy  Scale: Good Sitting balance - Comments: EOB without support   Standing balance support: Bilateral upper extremity supported, During functional activity Standing balance-Leahy Scale: Fair Standing balance comment: can stand without UB support, RW for gait                            Cognition Arousal/Alertness: Awake/alert Behavior During Therapy: WFL for tasks assessed/performed, Anxious Overall Cognitive Status: Within Functional Limits for tasks assessed                                 General Comments: good awareness of sternal precautions, min cues for improved line awareness/safety throughout. Pt received in chair with IV (out of his arm) on floor and fluids leaking out around where IV line was, RN notified, pt states he was unaware he had pulled it out.        Exercises      General Comments General comments (skin integrity, edema, etc.): SpO2 WFL on RA (pt weaned to RA prior to gait/stair trial) and remained on RA at end of session and 99-100% on RA wtih exertion and at rest.      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 PT Goal Formulation: With patient Time For Goal Achievement: 01/23/23 Progress towards PT goals: Progressing toward goals    Frequency    Min 1X/week      PT Plan Current plan remains appropriate       AM-PAC PT "6 Clicks" Mobility   Outcome Measure  Help needed turning from your back to your side while in a flat bed without using bedrails?: A Little Help needed moving from  lying on your back to sitting on the side of a flat bed without using bedrails?: A Little Help needed moving to and from a bed to a chair (including a wheelchair)?: A Little Help needed standing up from a chair using your arms (e.g., wheelchair or bedside chair)?: A Little Help needed to walk in hospital room?: A Little Help needed climbing 3-5 steps with a railing? : A Little 6 Click Score: 18    End of Session Equipment Utilized During Treatment: Gait belt;Oxygen (weaned to RA during session) Activity Tolerance: Patient tolerated treatment well;Patient limited by fatigue Patient left: in bed;with call bell/phone within reach (pt requests x4 rails up) Nurse Communication: Mobility status;Precautions;Other (comment) (weaned to RA, SpO2 WFL) PT Visit Diagnosis: Other abnormalities of gait and mobility (R26.89);Difficulty in walking, not elsewhere classified (R26.2)     Time: 1610-9604 PT Time Calculation (min) (ACUTE ONLY): 31 min  Charges:  $Gait Training: 8-22 mins $Therapeutic Activity: 8-22 mins                     Carmeron Heady P., PTA Acute Rehabilitation Services Secure Chat Preferred 9a-5:30pm Office: [phone]    Angus Palms 01/22/2023, 3:38 PM

## 2023-01-22 NOTE — H&P (View-Only) (Signed)
[facility]  Reason for Consult: Sternal wound Referring Physician: Dr. Tessa Lerner Bryan Brock is an 67 y.o. male.   HPI: This is a 67 year old gentleman with a history of CAD, obstructive sleep apnea who presented for CABG x4 (left IMA) on 01/02/2023.  He had complicated postoperative course with sternal dehiscence, undergoing sternal rewiring on 01/07/2023, and incision and drainage of peristernal fluid collection on 01/20/2023 with application of wound VAC.  Patient notes he is doing well today, no issues with the wound VAC.  Plastics has been consulted for assistance with the sternal wound.  Past Medical History:  Diagnosis Date   Acne    ADHD (attention deficit hyperactivity disorder)    Arthritis    Asthma    Bipolar 1 disorder (HCC)    Cancer (HCC)    basal cell cancer removed from back   Cataract    Coronary artery disease    Depression    Dyspnea    Dysrhythmia    PVCs   GERD (gastroesophageal reflux disease)    Heart murmur    Hepatitis    remote hx Hepatitis A (caused by food contaminant in childhood)   History of hiatal hernia    Hyperlipidemia    Hypertension    Mitral valve prolapse    Panic attacks    Peripheral vascular disease (HCC)    PFO (patent foramen ovale)    ? small PFO per echo   PONV (postoperative nausea and vomiting)    Pre-diabetes    Sleep apnea     Past Surgical History:  Procedure Laterality Date   APPLICATION OF WOUND VAC N/A 01/20/2023   Procedure: APPLICATION OF WOUND VAC;  Surgeon: Lovett Sox, MD;  Location: [facility] OR;  Service: Thoracic;  Laterality: N/A;   CORONARY ARTERY BYPASS GRAFT N/A 01/02/2023   Procedure: CORONARY ARTERY BYPASS GRAFTING (CABG) TIMES FOUR USING THE LEFT INTERNAL MAMMARY ARTERY (LIMA) AND BILATERAL GREATER SAPHENOUS VEIN ARTERIES;  Surgeon: [name], MD;  Location: [facility] OR;  Service: Open Heart Surgery;  Laterality: N/A;  Open median sternotomy   CORONARY PRESSURE/FFR STUDY N/A 11/21/2022    Procedure: INTRAVASCULAR PRESSURE WIRE/FFR STUDY;  Surgeon: Orbie Pyo, MD;  Location: [facility] INVASIVE CV LAB;  Service: Cardiovascular;  Laterality: N/A;   HYDROCELE EXCISION Bilateral 03/22/2022   Procedure: HYDROCELECTOMY ADULT;  Surgeon: Noel Christmas, MD;  Location: [facility] ORS;  Service: Urology;  Laterality: Bilateral;  2 HRS   LEFT HEART CATH AND CORONARY ANGIOGRAPHY N/A 11/21/2022   Procedure: LEFT HEART CATH AND CORONARY ANGIOGRAPHY;  Surgeon: Orbie Pyo, MD;  Location: [facility] INVAS[facility] CV LAB;  Service: Cardiovascular;  Laterality: N/A;   MOHS SURGERY  1990   STERNAL INCISION RECLOSURE N/A 01/07/2023   Procedure: STERNAL REWIRING;  Surgeon: [name], MD;  Location: [facility] OR;  Service: Thoracic;  Laterality: N/A;   STERNAL WOUND DEBRIDEMENT N/A 01/20/2023   Procedure: DRAIN STERNAL INFECTION;  Surgeon: Lovett Sox, MD;  Location: [facility] OR;  Service: Thoracic;  Laterality: N/A;   STRABISMUS SURGERY Left    TEE WITHOUT CARDIOVERSION N/A 01/02/2023   Procedure: TRANSESOPHAGEAL ECHOCARDIOGRAM;  Surgeon: [name], MD;  Location: [facility] OR;  Service: Open Heart Surgery;  Laterality: N/A;   TONSILLECTOMY AND ADENOIDECTOMY     WISDOM TOOTH EXTRACTION      Family History  Problem Relation Age of Onset   Heart disease Mother    Diabetes Mother    Obesity Mother  Hyperlipidemia Mother    Cancer Father        skin cancer   Heart disease Father    Hyperlipidemia Father    Colon cancer Neg Hx    Esophageal cancer Neg Hx    Rectal cancer Neg Hx    Stomach cancer Neg Hx     Social History:  reports that he has never smoked. His smokeless tobacco use includes snuff. He reports current alcohol use of about 1.0 - 2.0 standard drink of alcohol per week. He reports that he does not use drugs.  Allergies:  Allergies  Allergen Reactions   Latex Dermatitis   Lisinopril Cough    Medications:   Results for orders placed or performed during the hospital encounter of 01/02/23 (from  the past 48 hour(s))  Glucose, capillary     Status: Abnormal   Collection Time: 01/20/23  3:40 PM  Result Value Ref Range   Glucose-Capillary 150 (H) 70 - 99 mg/dL    Comment: Glucose reference range applies only to samples taken after fasting for at least 8 hours.  Glucose, capillary     Status: Abnormal   Collection Time: 01/20/23  9:12 PM  Result Value Ref Range   Glucose-Capillary 157 (H) 70 - 99 mg/dL    Comment: Glucose reference range applies only to samples taken after fasting for at least 8 hours.  CBC     Status: Abnormal   Collection Time: 01/21/23  1:01 AM  Result Value Ref Range   WBC 9.5 4.0 - 10.5 K/uL   RBC 3.08 (L) 4.22 - 5.81 MIL/uL   Hemoglobin 9.1 (L) 13.0 - 17.0 g/dL   HCT 16.1 (L) 09.6 - 04.5 %   MCV 94.5 80.0 - 100.0 fL   MCH 29.5 26.0 - 34.0 pg   MCHC 31.3 30.0 - 36.0 g/dL   RDW 40.9 81.1 - 91.4 %   Platelets 496 (H) 150 - 400 K/uL   nRBC 0.0 0.0 - 0.2 %    Comment: Performed at [facility], [address], [place], [place] [address]  Basic metabolic panel     Status: Abnormal   Collection Time: 01/21/23  1:01 AM  Result Value Ref Range   Sodium 130 (L) 135 - 145 mmol/L   Potassium 3.4 (L) 3.5 - 5.1 mmol/L   Chloride 90 (L) 98 - 111 mmol/L   CO2 29 22 - 32 mmol/L   Glucose, Bld 135 (H) 70 - 99 mg/dL    Comment: Glucose reference range applies only to samples taken after fasting for at least 8 hours.   BUN 29 (H) 8 - 23 mg/dL   Creatinine, Ser 6.21 (H) 0.61 - 1.24 mg/dL   Calcium 7.5 (L) 8.9 - 10.3 mg/dL   GFR, Estimated 59 (L) >60 mL/min    Comment: (NOTE) Calculated using the CKD-EPI Creatinine Equation (2021)    Anion gap 11 5 - 15    Comment: Performed at [facility], [address], [place], [place] [address]  Glucose, capillary     Status: Abnormal   Collection Time: 01/21/23  9:47 AM  Result Value Ref Range   Glucose-Capillary 119 (H) 70 - 99 mg/dL    Comment: Glucose reference range applies only to samples taken after  fasting for at least 8 hours.  Glucose, capillary     Status: Abnormal   Collection Time: 01/21/23 12:13 PM  Result Value Ref Range   Glucose-Capillary 160 (H) 70 - 99 mg/dL  Comment: Glucose reference range applies only to samples taken after fasting for at least 8 hours.  Glucose, capillary     Status: Abnormal   Collection Time: 01/21/23  5:10 PM  Result Value Ref Range   Glucose-Capillary 124 (H) 70 - 99 mg/dL    Comment: Glucose reference range applies only to samples taken after fasting for at least 8 hours.  Glucose, capillary     Status: Abnormal   Collection Time: 01/21/23  8:59 PM  Result Value Ref Range   Glucose-Capillary 125 (H) 70 - 99 mg/dL    Comment: Glucose reference range applies only to samples taken after fasting for at least 8 hours.   Comment 1 Notify RN    Comment 2 Document in Chart   CBC     Status: Abnormal   Collection Time: 01/22/23  2:47 AM  Result Value Ref Range   WBC 9.6 4.0 - 10.5 K/uL   RBC 3.19 (L) 4.22 - 5.81 MIL/uL   Hemoglobin 9.6 (L) 13.0 - 17.0 g/dL   HCT 16.1 (L) 09.6 - 04.5 %   MCV 96.2 80.0 - 100.0 fL   MCH 30.1 26.0 - 34.0 pg   MCHC 31.3 30.0 - 36.0 g/dL   RDW 40.9 81.1 - 91.4 %   Platelets 470 (H) 150 - 400 K/uL   nRBC 0.0 0.0 - 0.2 %    Comment: Performed at [facility], [address] [place], [place] [address]  Comprehensive metabolic panel     Status: Abnormal   Collection Time: 01/22/23  2:47 AM  Result Value Ref Range   Sodium 131 (L) 135 - 145 mmol/L   Potassium 3.2 (L) 3.5 - 5.1 mmol/L   Chloride 89 (L) 98 - 111 mmol/L   CO2 32 22 - 32 mmol/L   Glucose, Bld 107 (H) 70 - 99 mg/dL    Comment: Glucose reference range applies only to samples taken after fasting for at least 8 hours.   BUN 31 (H) 8 - 23 mg/dL   Creatinine, Ser 6.21 (H) 0.61 - 1.24 mg/dL   Calcium 8.1 (L) 8.9 - 10.3 mg/dL   Total Protein 5.2 (L) 6.5 - 8.1 g/dL   Albumin 2.4 (L) 3.5 - 5.0 g/dL   AST 23 15 - 41 U/L   ALT 24 0 - 44 U/L   Alkaline  Phosphatase 80 38 - 126 U/L   Total Bilirubin 1.1 0.3 - 1.2 mg/dL   GFR, Estimated 51 (L) >60 mL/min    Comment: (NOTE) Calculated using the CKD-EPI Creatinine Equation (2021)    Anion gap 10 5 - 15    Comment: Performed at [facility], [address] [place], [place] [address]  Glucose, capillary     Status: None   Collection Time: 01/22/23  5:56 AM  Result Value Ref Range   Glucose-Capillary 91 70 - 99 mg/dL    Comment: Glucose reference range applies only to samples taken after fasting for at least 8 hours.   Comment 1 Notify RN    Comment 2 Document in Chart   Glucose, capillary     Status: Abnormal   Collection Time: 01/22/23 11:31 AM  Result Value Ref Range   Glucose-Capillary 146 (H) 70 - 99 mg/dL    Comment: Glucose reference range applies only to samples taken after fasting for at least 8 hours.    DG CHEST PORT 1 VIEW  Result Date: 01/22/2023 CLINICAL DATA:  Provided history: Pneumothorax. Sternal  fluid. Wound VAC. EXAM: PORTABLE CHEST 1 VIEW COMPARISON:  Prior chest radiographs 01/20/2023 and earlier. Chest CT 01/19/2023. FINDINGS: Prior median sternotomy. The cardiomediastinal silhouette is unchanged. Persistent small right apical pneumothorax, similar to the prior chest radiograph of 01/20/2023. The known chest wall fluid collection is poorly defined. The left pleural effusion appears unchanged. Ill-defined opacity within the mid and lower left lung, increased from the prior examination. No definite airspace consolidation or pleural effusion on the right. IMPRESSION: 1. Small right apical pneumothorax, not significantly changed from the prior chest radiograph of 01/20/2023. 2. The known chest wall fluid collection is poorly defined. 3. Ill-defined opacity within the mid and lower left lung, increased in the prior examination. This is favored to reflect atelectasis. However, pneumonia cannot be excluded. Recommend attention on imaging follow-up. 4. Unchanged left pleural  effusion. Electronically Signed   By: Jackey Loge D.O.   On: 01/22/2023 08:48    ROS Positive for chest wound  Blood pressure 118/62, pulse 79, temperature 97.9 F (36.6 C), resp. rate 18, height 5\' 11"  (1.803 m), weight (!) 145.6 kg, SpO2 98 %. Physical Exam   12 cm in length wound noted to the sternal region with wound VAC in place, good seal, no surrounding redness or warmth to touch, wound VAC canister with serosanguineous output   Assessment/Plan:  This is a 67 year old gentleman seen in consultation for evaluation of sternal wound.  Unfortunately patient has developed some dehiscence of the sternal wound.  Dr. Ulice Bold will be joining cardiothoracic surgery tomorrow afternoon for excision of sternal wound with application of myriad and application of wound VAC.  I discussed the case with the patient today including the use of myriad, he would like to proceed.  Will plan for operative intervention tomorrow.   Kelle Darting Dawnette Mione, PA-C 01/22/2023, 12:51 PM

## 2023-01-22 NOTE — Progress Notes (Addendum)
[address]       Bryan Brock [address]             [phone]      2 Days Post-Op Procedure(s) (LRB): DRAIN STERNAL INFECTION (N/A) APPLICATION OF WOUND VAC (N/A) Subjective: Patient states he had some gas pains last night but is feeling better this AM.   Objective: Vital signs in last 24 hours: Temp:  [97.8 F (36.6 C)-98.5 F (36.9 C)] 98.5 F (36.9 C) (05/28 2300) Pulse Rate:  [64-86] 67 (05/28 2300) Cardiac Rhythm: Normal sinus rhythm (05/28 1945) Resp:  [16-25] 16 (05/28 2300) BP: (99-133)/(35-75) 106/60 (05/28 2300) SpO2:  [85 %-100 %] 100 % (05/28 2300)  Hemodynamic parameters for last 24 hours:    Intake/Output from previous day: 05/28 0701 - 05/29 0700 In: 940 [P.O.:240; IV Piggyback:700] Out: 3050 [Urine:2850; Drains:200] Intake/Output this shift: No intake/output data recorded.  General appearance: alert, cooperative, and no distress Neurologic: intact Heart: regular rate and rhythm, S1, S2 normal, no murmur, click, rub or gallop Lungs: Diminished breath sounds bibasilar Abdomen: soft, non-tender; bowel sounds normal; no masses,  no organomegaly Extremities: edema 2+ Wound: Clean, dry and healing well at the superior portion of the sternal incision. Inferior sternal incision with wound vac in place, good seal. Serosanguinous drainage.   Lab Results: Recent Labs    01/21/23 0101 01/22/23 0247  WBC 9.5 9.6  HGB 9.1* 9.6*  HCT 29.1* 30.7*  PLT 496* 470*   BMET:  Recent Labs    01/21/23 0101 01/22/23 0247  NA 130* 131*  K 3.4* 3.2*  CL 90* 89*  CO2 29 32  GLUCOSE 135* 107*  BUN 29* 31*  CREATININE 1.32* 1.49*  CALCIUM 7.5* 8.1*    PT/INR: No results for input(s): "LABPROT", "INR" in the last 72 hours. ABG    Component Value Date/Time   PHART 7.399 01/08/2023 0830   HCO3 28.2 (H) 01/08/2023 0830   TCO2 30 01/08/2023 0830   ACIDBASEDEF 1.0 01/07/2023 1936   O2SAT 97 01/08/2023 0830   CBG (last 3)  Recent Labs     01/21/23 1710 01/21/23 2059 01/22/23 0556  GLUCAP 124* 125* 91    Assessment/Plan: S/P Procedure(s) (LRB): DRAIN STERNAL INFECTION (N/A) APPLICATION OF WOUND VAC (N/A)  CV: NSR, HR 60-70s this AM. SBP 106. Hx of afib. Continue Amiodarone 200mg  BID and Lopressor 37.5mg  BID.   Pulm: Saturating 100% on 1L [place]. Diagnostic left thoracentesis done on 05/26 with 50cc dark amber fluid, no WBC or organisms seen. CXR with stable small right pneumothorax and stable small to moderate bilateral pleural effusions left greater than right. Continue Albuterol and Pulmicort. Encourage IS, flutter valve and ambulation.   GI: Good appetite, no nausea.   Endo: CBGs controlled on SSI. New diabetes diagnosis A1C 6.6, outpatient appt with PCP has been arranged.   Renal: AKI likely due to diuresis, Cr 1.49. Hold Lasix for today. UO 2850cc/24hrs. K 3.2, supplement.   ID: Leukocytosis resolved. WBC 9.6, afebrile. On IV Cefepime and IV Vancomycin for sternal wound. Cultures pending.    Expected postop ABLA: Last H/H stable 9.6/30.7   Hyponatremia: Improving Na 131, volume overload vs diuresis. Will monitor.    Sternal dehiscence: Chest CT on 05/26 showed increased sternal dehiscence of the lower sternum with increased fluid collection. Now POD 2 sternal wound drainage and wound vac placement. On IV abx, cultures pending no organisms or WBC seen on gram stain from surgical wound drainage or  thoracentesis. Wound VAC working properly, good seal. 200cc drainage/24hrs.    Deconditioning: Continue work with PT.   Dispo: Hold Lasix and supplement K today. Discuss possible need for flap with plastic surgery.      LOS: 20 days    Bryan Reichmann, Bryan Brock 01/22/2023   Chart reviewed, patient examined, agree with above. He looks comfortable overall. Wound vac is working appropriately with good suction. Cultures of wound negative. -2100 cc yesterday with diuretic. Weights have been all over the place so I don't  think we can rely on those. He still has some edema in legs but BUN/creat up and K+ down. Will hold on further diuresis today and replete K+.  He will need VAC change possibly tomorrow. I will discuss with plastic surgery to see if they can examine wound during VAC change for further wound care planning.

## 2023-01-22 NOTE — Consult Note (Signed)
[facility]  Reason for Consult: Sternal wound Referring Physician: Dr. Bartle   Bryan Brock is an 67 y.o. male.   HPI: This is a 59-year-old gentleman with a history of CAD, obstructive sleep apnea who presented for CABG x4 (left IMA) on 01/02/2023.  He had complicated postoperative course with sternal dehiscence, undergoing sternal rewiring on 01/07/2023, and incision and drainage of peristernal fluid collection on 01/20/2023 with application of wound VAC.  Patient notes he is doing well today, no issues with the wound VAC.  Plastics has been consulted for assistance with the sternal wound.  Past Medical History:  Diagnosis Date   Acne    ADHD (attention deficit hyperactivity disorder)    Arthritis    Asthma    Bipolar 1 disorder (HCC)    Cancer (HCC)    basal cell cancer removed from back   Cataract    Coronary artery disease    Depression    Dyspnea    Dysrhythmia    PVCs   GERD (gastroesophageal reflux disease)    Heart murmur    Hepatitis    remote hx Hepatitis A (caused by food contaminant in childhood)   History of hiatal hernia    Hyperlipidemia    Hypertension    Mitral valve prolapse    Panic attacks    Peripheral vascular disease (HCC)    PFO (patent foramen ovale)    ? small PFO per echo   PONV (postoperative nausea and vomiting)    Pre-diabetes    Sleep apnea     Past Surgical History:  Procedure Laterality Date   APPLICATION OF WOUND VAC N/A 01/20/2023   Procedure: APPLICATION OF WOUND VAC;  Surgeon: Vantrigt, Peter, MD;  Location: [facility];  Service: Thoracic;  Laterality: N/A;   CORONARY ARTERY BYPASS GRAFT N/A 01/02/2023   Procedure: CORONARY ARTERY BYPASS GRAFTING (CABG) TIMES FOUR USING THE LEFT INTERNAL MAMMARY ARTERY (LIMA) AND BILATERAL GREATER SAPHENOUS VEIN ARTERIES;  Surgeon: Bartle, Bryan K, MD;  Location: [facility] OR;  Service: Open Heart Surgery;  Laterality: N/A;  Open median sternotomy   CORONARY PRESSURE/FFR STUDY N/A 11/21/2022    Procedure: INTRAVASCULAR PRESSURE WIRE/FFR STUDY;  Surgeon: Thukkani, Arun K, MD;  Location: [facility] INVAS[facility];  Service: Cardiovascular;  Laterality: N/A;   HYDROCELE EXCISION Bilateral 03/22/2022   Procedure: HYDROCELECTOMY ADULT;  Surgeon: Pace, Maryellen D, MD;  Location: [facility];  Service: Urology;  Laterality: Bilateral;  2 HRS   LEFT HEART CATH AND CORONARY ANGIOGRAPHY N/A 11/21/2022   Procedure: LEFT HEART CATH AND CORONARY ANGIOGRAPHY;  Surgeon: Thukkani, Arun K, MD;  Location: [facility];  Service: Cardiovascular;  Laterality: N/A;   MOHS SURGERY  1990   STERNAL INCISION RECLOSURE N/A 01/07/2023   Procedure: STERNAL REWIRING;  Surgeon: Bartle, Bryan K, MD;  Location: [facility] OR;  Service: Thoracic;  Laterality: N/A;   STERNAL WOUND DEBRIDEMENT N/A 01/20/2023   Procedure: DRAIN STERNAL INFECTION;  Surgeon: Vantrigt, Peter, MD;  Location: [facility];  Service: Thoracic;  Laterality: N/A;   STRABISMUS SURGERY Left    TEE WITHOUT CARDIOVERSION N/A 01/02/2023   Procedure: TRANSESOPHAGEAL ECHOCARDIOGRAM;  Surgeon: Bartle, Bryan K, MD;  Location: [facility];  Service: Open Heart Surgery;  Laterality: N/A;   TONSILLECTOMY AND ADENOIDECTOMY     WISDOM TOOTH EXTRACTION      Family History  Problem Relation Age of Onset   Heart disease Mother    Diabetes Mother    Obesity Mother      Hyperlipidemia Mother    Cancer Father        skin cancer   Heart disease Father    Hyperlipidemia Father    Colon cancer Neg Hx    Esophageal cancer Neg Hx    Rectal cancer Neg Hx    Stomach cancer Neg Hx     Social History:  reports that he has never smoked. His smokeless tobacco use includes snuff. He reports current alcohol use of about 1.0 - 2.0 standard drink of alcohol per week. He reports that he does not use drugs.  Allergies:  Allergies  Allergen Reactions   Latex Dermatitis   Lisinopril Cough    Medications:   Results for orders placed or performed during the hospital encounter of 01/02/23 (from  the past 48 hour(s))  Glucose, capillary     Status: Abnormal   Collection Time: 01/20/23  3:40 PM  Result Value Ref Range   Glucose-Capillary 150 (H) 70 - 99 mg/dL    Comment: Glucose reference range applies only to samples taken after fasting for at least 8 hours.  Glucose, capillary     Status: Abnormal   Collection Time: 01/20/23  9:12 PM  Result Value Ref Range   Glucose-Capillary 157 (H) 70 - 99 mg/dL    Comment: Glucose reference range applies only to samples taken after fasting for at least 8 hours.  CBC     Status: Abnormal   Collection Time: 01/21/23  1:01 AM  Result Value Ref Range   WBC 9.5 4.0 - 10.5 K/uL   RBC 3.08 (L) 4.22 - 5.81 MIL/uL   Hemoglobin 9.1 (L) 13.0 - 17.0 g/dL   HCT 29.1 (L) 39.0 - 52.0 %   MCV 94.5 80.0 - 100.0 fL   MCH 29.5 26.0 - 34.0 pg   MCHC 31.3 30.0 - 36.0 g/dL   RDW 14.0 11.5 - 15.5 %   Platelets 496 (H) 150 - 400 K/uL   nRBC 0.0 0.0 - 0.2 %    Comment: Performed at [facility], [address] [place], [place] [address]  Basic metabolic panel     Status: Abnormal   Collection Time: 01/21/23  1:01 AM  Result Value Ref Range   Sodium 130 (L) 135 - 145 mmol/L   Potassium 3.4 (L) 3.5 - 5.1 mmol/L   Chloride 90 (L) 98 - 111 mmol/L   CO2 29 22 - 32 mmol/L   Glucose, Bld 135 (H) 70 - 99 mg/dL    Comment: Glucose reference range applies only to samples taken after fasting for at least 8 hours.   BUN 29 (H) 8 - 23 mg/dL   Creatinine, Ser 1.32 (H) 0.61 - 1.24 mg/dL   Calcium 7.5 (L) 8.9 - 10.3 mg/dL   GFR, Estimated 59 (L) >60 mL/min    Comment: (NOTE) Calculated using the CKD-EPI Creatinine Equation (2021)    Anion gap 11 5 - 15    Comment: Performed at [facility], [address] , [place] [address]  Glucose, capillary     Status: Abnormal   Collection Time: 01/21/23  9:47 AM  Result Value Ref Range   Glucose-Capillary 119 (H) 70 - 99 mg/dL    Comment: Glucose reference range applies only to samples taken after  fasting for at least 8 hours.  Glucose, capillary     Status: Abnormal   Collection Time: 01/21/23 12:13 PM  Result Value Ref Range   Glucose-Capillary 160 (H) 70 - 99 mg/dL      Comment: Glucose reference range applies only to samples taken after fasting for at least 8 hours.  Glucose, capillary     Status: Abnormal   Collection Time: 01/21/23  5:10 PM  Result Value Ref Range   Glucose-Capillary 124 (H) 70 - 99 mg/dL    Comment: Glucose reference range applies only to samples taken after fasting for at least 8 hours.  Glucose, capillary     Status: Abnormal   Collection Time: 01/21/23  8:59 PM  Result Value Ref Range   Glucose-Capillary 125 (H) 70 - 99 mg/dL    Comment: Glucose reference range applies only to samples taken after fasting for at least 8 hours.   Comment 1 Notify RN    Comment 2 Document in Chart   CBC     Status: Abnormal   Collection Time: 01/22/23  2:47 AM  Result Value Ref Range   WBC 9.6 4.0 - 10.5 K/uL   RBC 3.19 (L) 4.22 - 5.81 MIL/uL   Hemoglobin 9.6 (L) 13.0 - 17.0 g/dL   HCT 30.7 (L) 39.0 - 52.0 %   MCV 96.2 80.0 - 100.0 fL   MCH 30.1 26.0 - 34.0 pg   MCHC 31.3 30.0 - 36.0 g/dL   RDW 14.3 11.5 - 15.5 %   Platelets 470 (H) 150 - 400 K/uL   nRBC 0.0 0.0 - 0.2 %    Comment: Performed at [facility], [address] [place], [place] [address]  Comprehensive metabolic panel     Status: Abnormal   Collection Time: 01/22/23  2:47 AM  Result Value Ref Range   Sodium 131 (L) 135 - 145 mmol/L   Potassium 3.2 (L) 3.5 - 5.1 mmol/L   Chloride 89 (L) 98 - 111 mmol/L   CO2 32 22 - 32 mmol/L   Glucose, Bld 107 (H) 70 - 99 mg/dL    Comment: Glucose reference range applies only to samples taken after fasting for at least 8 hours.   BUN 31 (H) 8 - 23 mg/dL   Creatinine, Ser 1.49 (H) 0.61 - 1.24 mg/dL   Calcium 8.1 (L) 8.9 - 10.3 mg/dL   Total Protein 5.2 (L) 6.5 - 8.1 g/dL   Albumin 2.4 (L) 3.5 - 5.0 g/dL   AST 23 15 - 41 U/L   ALT 24 0 - 44 U/L   Alkaline  Phosphatase 80 38 - 126 U/L   Total Bilirubin 1.1 0.3 - 1.2 mg/dL   GFR, Estimated 51 (L) >60 mL/min    Comment: (NOTE) Calculated using the CKD-EPI Creatinine Equation (2021)    Anion gap 10 5 - 15    Comment: Performed at [facility], [address] [place], [place] [address]  Glucose, capillary     Status: None   Collection Time: 01/22/23  5:56 AM  Result Value Ref Range   Glucose-Capillary 91 70 - 99 mg/dL    Comment: Glucose reference range applies only to samples taken after fasting for at least 8 hours.   Comment 1 Notify RN    Comment 2 Document in Chart   Glucose, capillary     Status: Abnormal   Collection Time: 01/22/23 11:31 AM  Result Value Ref Range   Glucose-Capillary 146 (H) 70 - 99 mg/dL    Comment: Glucose reference range applies only to samples taken after fasting for at least 8 hours.    DG CHEST PORT 1 VIEW  Result Date: 01/22/2023 CLINICAL DATA:  Provided history: Pneumothorax. Sternal   fluid. Wound VAC. EXAM: PORTABLE CHEST 1 VIEW COMPARISON:  Prior chest radiographs 01/20/2023 and earlier. Chest CT 01/19/2023. FINDINGS: Prior median sternotomy. The cardiomediastinal silhouette is unchanged. Persistent small right apical pneumothorax, similar to the prior chest radiograph of 01/20/2023. The known chest wall fluid collection is poorly defined. The left pleural effusion appears unchanged. Ill-defined opacity within the mid and lower left lung, increased from the prior examination. No definite airspace consolidation or pleural effusion on the right. IMPRESSION: 1. Small right apical pneumothorax, not significantly changed from the prior chest radiograph of 01/20/2023. 2. The known chest wall fluid collection is poorly defined. 3. Ill-defined opacity within the mid and lower left lung, increased in the prior examination. This is favored to reflect atelectasis. However, pneumonia cannot be excluded. Recommend attention on imaging follow-up. 4. Unchanged left pleural  effusion. Electronically Signed   By: Kyle  Golden D.O.   On: 01/22/2023 08:48    ROS Positive for chest wound  Blood pressure 118/62, pulse 79, temperature 97.9 F (36.6 C), resp. rate 18, height 5' 11" (1.803 m), weight (!) 145.6 kg, SpO2 98 %. Physical Exam   12 cm in length wound noted to the sternal region with wound VAC in place, good seal, no surrounding redness or warmth to touch, wound VAC canister with serosanguineous output   Assessment/Plan:  This is a 67-year-old gentleman seen in consultation for evaluation of sternal wound.  Unfortunately patient has developed some dehiscence of the sternal wound.  Dr. Dillingham will be joining cardiothoracic surgery tomorrow afternoon for excision of sternal wound with application of myriad and application of wound VAC.  I discussed the case with the patient today including the use of myriad, he would like to proceed.  Will plan for operative intervention tomorrow.   Ianna Salmela Todd Yafet Cline, PA-C 01/22/2023, 12:51 PM   

## 2023-01-22 NOTE — Progress Notes (Signed)
Removed 2 sutures per order. The 4 remaining sutures are under the sternal wound vac dressing.

## 2023-01-22 NOTE — Progress Notes (Signed)
Mobility Specialist Progress Note:    01/22/23 1117  Mobility  Activity Ambulated with assistance in hallway  Level of Assistance Contact guard assist, steadying assist  Assistive Device Front wheel walker  Distance Ambulated (ft) 400 ft  Activity Response Tolerated well  Mobility Referral Yes  $Mobility charge 1 Mobility  Mobility Specialist Start Time (ACUTE ONLY) 1008  Mobility Specialist Stop Time (ACUTE ONLY) 1034  Mobility Specialist Time Calculation (min) (ACUTE ONLY) 26 min   Pt received in chair, very agreeable to ambulate. Pt ambulated on 1L/min. Pt c/o pain in his R foot rating it 4/10. He took x2 rest break d/t fatigue. Pt assisted to the [facility] w/ call bell at hand. NT notified.   Thompson Grayer Mobility Specialist  Please contact vis Secure Chat or  Rehab Office [phone]

## 2023-01-23 ENCOUNTER — Encounter (HOSPITAL_COMMUNITY): Payer: Self-pay | Admitting: Surgery

## 2023-01-23 ENCOUNTER — Inpatient Hospital Stay (HOSPITAL_COMMUNITY): Payer: Medicare Other | Admitting: Certified Registered Nurse Anesthetist

## 2023-01-23 ENCOUNTER — Ambulatory Visit: Payer: Medicare Other | Admitting: Nurse Practitioner

## 2023-01-23 ENCOUNTER — Encounter (HOSPITAL_COMMUNITY)
Admission: RE | Disposition: A | Discharge status: Still In Hospital | Payer: Self-pay | Source: Ambulatory Visit | Attending: Surgery

## 2023-01-23 ENCOUNTER — Other Ambulatory Visit: Payer: Self-pay

## 2023-01-23 DIAGNOSIS — T8131XA Disruption of external operation (surgical) wound, not elsewhere classified, initial encounter: Secondary | ICD-10-CM

## 2023-01-23 DIAGNOSIS — I119 Hypertensive heart disease without heart failure: Secondary | ICD-10-CM | POA: Diagnosis not present

## 2023-01-23 DIAGNOSIS — I2510 Atherosclerotic heart disease of native coronary artery without angina pectoris: Secondary | ICD-10-CM | POA: Diagnosis not present

## 2023-01-23 DIAGNOSIS — J45909 Unspecified asthma, uncomplicated: Secondary | ICD-10-CM

## 2023-01-23 DIAGNOSIS — Z951 Presence of aortocoronary bypass graft: Secondary | ICD-10-CM

## 2023-01-23 DIAGNOSIS — T8149XA Infection following a procedure, other surgical site, initial encounter: Secondary | ICD-10-CM | POA: Diagnosis not present

## 2023-01-23 DIAGNOSIS — I341 Nonrheumatic mitral (valve) prolapse: Secondary | ICD-10-CM | POA: Diagnosis not present

## 2023-01-23 HISTORY — PX: STERNAL WOUND DEBRIDEMENT: SHX1058

## 2023-01-23 HISTORY — PX: APPLICATION OF WOUND VAC: SHX5189

## 2023-01-23 LAB — GLUCOSE, CAPILLARY
Glucose-Capillary: 102 mg/dL — ABNORMAL HIGH (ref 70–99)
Glucose-Capillary: 98 mg/dL (ref 70–99)
Glucose-Capillary: 96 mg/dL (ref 70–99)
Glucose-Capillary: 119 mg/dL — ABNORMAL HIGH (ref 70–99)

## 2023-01-23 LAB — BASIC METABOLIC PANEL
Anion gap: 11 (ref 5–15)
BUN: 29 mg/dL — ABNORMAL HIGH (ref 8–23)
CO2: 32 mmol/L (ref 22–32)
Calcium: 8.4 mg/dL — ABNORMAL LOW (ref 8.9–10.3)
Chloride: 92 mmol/L — ABNORMAL LOW (ref 98–111)
Creatinine, Ser: 1.32 mg/dL — ABNORMAL HIGH (ref 0.61–1.24)
GFR, Estimated: 59 mL/min — ABNORMAL LOW (ref >60)
Glucose, Bld: 113 mg/dL — ABNORMAL HIGH (ref 70–99)
Potassium: 3.1 mmol/L — ABNORMAL LOW (ref 3.5–5.1)
Sodium: 135 mmol/L (ref 135–145)

## 2023-01-23 LAB — TYPE AND SCREEN
ABO/RH(D): O POS
Unit division: 0

## 2023-01-23 LAB — FUNGUS CULTURE WITH STAIN

## 2023-01-23 LAB — ACID FAST SMEAR (AFB, MYCOBACTERIA): Acid Fast Smear: NEGATIVE

## 2023-01-23 LAB — BPAM RBC
Blood Product Expiration Date: 202406242359
Unit Type and Rh: 5100

## 2023-01-23 LAB — PATHOLOGIST SMEAR REVIEW

## 2023-01-23 LAB — AEROBIC/ANAEROBIC CULTURE W GRAM STAIN (SURGICAL/DEEP WOUND)

## 2023-01-23 LAB — FUNGUS CULTURE RESULT

## 2023-01-23 SURGERY — DEBRIDEMENT, WOUND, STERNUM
Anesthesia: General

## 2023-01-23 MED ORDER — VASHE WOUND IRRIGATION OPTIME
TOPICAL | Status: DC | PRN
  Administered 2023-01-23: 34 [oz_av]

## 2023-01-23 MED ORDER — VANCOMYCIN HCL 1500 MG/300ML IV SOLN
1500.0000 mg | INTRAVENOUS | Status: DC
  Filled 2023-01-23: qty 300

## 2023-01-23 MED ORDER — CHLORHEXIDINE GLUCONATE 0.12 % MT SOLN: 15.0000 mL | Freq: Once | OROMUCOSAL | Status: AC

## 2023-01-23 MED ORDER — LACTATED RINGERS IV SOLN: INTRAVENOUS | Status: DC

## 2023-01-23 MED ORDER — PHENYLEPHRINE 80 MCG/ML (10ML) SYRINGE FOR IV PUSH (FOR BLOOD PRESSURE SUPPORT)
PREFILLED_SYRINGE | INTRAVENOUS | Status: AC
  Filled 2023-01-23: qty 10

## 2023-01-23 MED ORDER — SODIUM CHLORIDE 0.9 % IR SOLN
Status: DC | PRN
  Administered 2023-01-23: 1000 mL

## 2023-01-23 MED ORDER — SUGAMMADEX SODIUM 200 MG/2ML IV SOLN
INTRAVENOUS | Status: DC | PRN
  Administered 2023-01-23: 200 mg via INTRAVENOUS

## 2023-01-23 MED ORDER — GABAPENTIN 100 MG PO CAPS
200.0000 mg | ORAL_CAPSULE | Freq: Three times a day (TID) | ORAL | Status: DC
  Administered 2023-01-23 – 2023-01-24 (×2): 200 mg via ORAL
  Filled 2023-01-23 (×2): qty 2

## 2023-01-23 MED ORDER — PHENYLEPHRINE HCL (PRESSORS) 10 MG/ML IV SOLN
INTRAVENOUS | Status: DC | PRN
  Administered 2023-01-23: 80 ug via INTRAVENOUS

## 2023-01-23 MED ORDER — MIDAZOLAM HCL 5 MG/5ML IJ SOLN
INTRAMUSCULAR | Status: DC | PRN
  Administered 2023-01-23: 1 mg via INTRAVENOUS

## 2023-01-23 MED ORDER — POTASSIUM CHLORIDE CRYS ER 20 MEQ PO TBCR: 40.0000 meq | EXTENDED_RELEASE_TABLET | ORAL | Status: DC

## 2023-01-23 MED ORDER — PROPOFOL 10 MG/ML IV BOLUS
INTRAVENOUS | Status: DC | PRN
  Administered 2023-01-23: 200 mg via INTRAVENOUS

## 2023-01-23 MED ORDER — ORAL CARE MOUTH RINSE: 15.0000 mL | Freq: Once | OROMUCOSAL | Status: AC

## 2023-01-23 MED ORDER — BUPIVACAINE LIPOSOME 1.3 % IJ SUSP
20.0000 mL | Freq: Once | INTRAMUSCULAR | Status: AC
  Administered 2023-01-23: 20 mL
  Filled 2023-01-23: qty 20

## 2023-01-23 MED ORDER — LIDOCAINE 2% (20 MG/ML) 5 ML SYRINGE
INTRAMUSCULAR | Status: AC
  Filled 2023-01-23: qty 5

## 2023-01-23 MED ORDER — LIDOCAINE 2% (20 MG/ML) 5 ML SYRINGE
INTRAMUSCULAR | Status: DC | PRN
  Administered 2023-01-23: 40 mg via INTRAVENOUS

## 2023-01-23 MED ORDER — ORAL CARE MOUTH RINSE: 15.0000 mL | Freq: Once | OROMUCOSAL | Status: DC

## 2023-01-23 MED ORDER — POTASSIUM CHLORIDE CRYS ER 20 MEQ PO TBCR: 40.0000 meq | EXTENDED_RELEASE_TABLET | Freq: Once | ORAL | Status: DC

## 2023-01-23 MED ORDER — POTASSIUM CHLORIDE CRYS ER 20 MEQ PO TBCR
40.0000 meq | EXTENDED_RELEASE_TABLET | ORAL | Status: DC
  Administered 2023-01-23: 40 meq via ORAL
  Filled 2023-01-23: qty 2

## 2023-01-23 MED ORDER — ROCURONIUM BROMIDE 10 MG/ML (PF) SYRINGE
PREFILLED_SYRINGE | INTRAVENOUS | Status: DC | PRN
  Administered 2023-01-23: 50 mg via INTRAVENOUS

## 2023-01-23 MED ORDER — CHLORHEXIDINE GLUCONATE 0.12 % MT SOLN
OROMUCOSAL | Status: AC
  Administered 2023-01-23: 15 mL via OROMUCOSAL
  Filled 2023-01-23: qty 15

## 2023-01-23 MED ORDER — SUCCINYLCHOLINE CHLORIDE 200 MG/10ML IV SOSY
PREFILLED_SYRINGE | INTRAVENOUS | Status: AC
  Filled 2023-01-23: qty 20

## 2023-01-23 MED ORDER — CHLORHEXIDINE GLUCONATE 0.12 % MT SOLN: 15.0000 mL | Freq: Once | OROMUCOSAL | Status: DC

## 2023-01-23 MED ORDER — NICOTINE 14 MG/24HR TD PT24
14.0000 mg | MEDICATED_PATCH | Freq: Every day | TRANSDERMAL | Status: DC
  Administered 2023-01-24 – 2023-02-04 (×11): 14 mg via TRANSDERMAL
  Filled 2023-01-23 (×12): qty 1

## 2023-01-23 MED ORDER — FENTANYL CITRATE (PF) 250 MCG/5ML IJ SOLN
INTRAMUSCULAR | Status: AC
  Filled 2023-01-23: qty 5

## 2023-01-23 MED ORDER — FENTANYL CITRATE (PF) 250 MCG/5ML IJ SOLN
INTRAMUSCULAR | Status: DC | PRN
  Administered 2023-01-23: 50 ug via INTRAVENOUS
  Administered 2023-01-23: 100 ug via INTRAVENOUS
  Administered 2023-01-23 (×2): 50 ug via INTRAVENOUS

## 2023-01-23 MED ORDER — MIDAZOLAM HCL 2 MG/2ML IJ SOLN
INTRAMUSCULAR | Status: AC
  Filled 2023-01-23: qty 2

## 2023-01-23 MED ORDER — ROCURONIUM BROMIDE 10 MG/ML (PF) SYRINGE
PREFILLED_SYRINGE | INTRAVENOUS | Status: AC
  Filled 2023-01-23: qty 50

## 2023-01-23 MED ORDER — ONDANSETRON HCL 4 MG/2ML IJ SOLN
INTRAMUSCULAR | Status: DC | PRN
  Administered 2023-01-23: 4 mg via INTRAVENOUS

## 2023-01-23 MED ORDER — PHENYLEPHRINE HCL-NACL 20-0.9 MG/250ML-% IV SOLN
INTRAVENOUS | Status: DC | PRN
  Administered 2023-01-23: 50 ug/min via INTRAVENOUS

## 2023-01-23 MED ORDER — BUPIVACAINE LIPOSOME 1.3 % IJ SUSP
10.0000 mL | INTRAMUSCULAR | Status: DC
  Filled 2023-01-23: qty 20

## 2023-01-23 MED ORDER — ONDANSETRON HCL 4 MG/2ML IJ SOLN
INTRAMUSCULAR | Status: AC
  Filled 2023-01-23: qty 2

## 2023-01-23 SURGICAL SUPPLY — 76 items
APL SKNCLS STERI-STRIP NONHPOA (GAUZE/BANDAGES/DRESSINGS)
BENZOIN TINCTURE PRP APPL 2/3 (GAUZE/BANDAGES/DRESSINGS) IMPLANT
BINDER BREAST XXLRG (GAUZE/BANDAGES/DRESSINGS) IMPLANT
BLADE CLIPPER SURG (BLADE) ×1 IMPLANT
BLADE SURG 10 STRL SS (BLADE) IMPLANT
BLADE SURG 15 STRL LF DISP TIS (BLADE) IMPLANT
BLADE SURG 15 STRL SS (BLADE)
BNDG GAUZE DERMACEA FLUFF 4 (GAUZE/BANDAGES/DRESSINGS) IMPLANT
BNDG GZE DERMACEA 4 6PLY (GAUZE/BANDAGES/DRESSINGS)
CANISTER SUCT 3000ML PPV (MISCELLANEOUS) ×1 IMPLANT
CANISTER WOUND CARE 500ML ATS (WOUND CARE) ×1 IMPLANT
CLEANSER WND VASHE 34 (WOUND CARE) IMPLANT
CLIP TI WIDE RED SMALL 24 (CLIP) IMPLANT
CNTNR URN SCR LID CUP LEK RST (MISCELLANEOUS) IMPLANT
CONT SPEC 4OZ STRL OR WHT (MISCELLANEOUS)
CONTAINER PROTECT SURGISLUSH (MISCELLANEOUS) ×2 IMPLANT
DRAPE INCISE IOBAN 66X45 STRL (DRAPES) IMPLANT
DRAPE LAPAROSCOPIC ABDOMINAL (DRAPES) ×1 IMPLANT
DRAPE SLUSH/WARMER DISC (DRAPES) IMPLANT
DRESSING MORCELLS FINE 1000 (Tissue) IMPLANT
DRSG CUTIMED SORBACT 7X9 (GAUZE/BANDAGES/DRESSINGS) IMPLANT
DRSG HYDROCOLLOID 4X4 (GAUZE/BANDAGES/DRESSINGS) IMPLANT
DRSG VAC GRANUFOAM LG (GAUZE/BANDAGES/DRESSINGS) ×1 IMPLANT
DRSG VAC GRANUFOAM MED (GAUZE/BANDAGES/DRESSINGS) ×1 IMPLANT
DRSG VAC GRANUFOAM SM (GAUZE/BANDAGES/DRESSINGS) ×1 IMPLANT
DRSG VERAFLO VAC MED (GAUZE/BANDAGES/DRESSINGS) IMPLANT
DRSG VERSA FOAM LRG 10X15 (GAUZE/BANDAGES/DRESSINGS) IMPLANT
ELECT REM PT RETURN 9FT ADLT (ELECTROSURGICAL) ×1
ELECTRODE REM PT RTRN 9FT ADLT (ELECTROSURGICAL) ×1 IMPLANT
GAUZE 4X4 16PLY ~~LOC~~+RFID DBL (SPONGE) ×1 IMPLANT
GAUZE PAD ABD 8X10 STRL (GAUZE/BANDAGES/DRESSINGS) IMPLANT
GAUZE SPONGE 4X4 12PLY STRL (GAUZE/BANDAGES/DRESSINGS) IMPLANT
GAUZE XEROFORM 5X9 LF (GAUZE/BANDAGES/DRESSINGS) IMPLANT
GLOVE BIO SURGEON STRL SZ7.5 (GLOVE) ×2 IMPLANT
GLOVE BIOGEL PI IND STRL 6 (GLOVE) IMPLANT
GLOVE BIOGEL PI IND STRL 7.0 (GLOVE) IMPLANT
GLOVE BIOGEL PI IND STRL 7.5 (GLOVE) IMPLANT
GLOVE ECLIPSE 7.0 STRL STRAW (GLOVE) IMPLANT
GLOVE SURG SS PI 6.5 STRL IVOR (GLOVE) IMPLANT
GLOVE SURG SS PI 7.5 STRL IVOR (GLOVE) IMPLANT
GOWN STRL REUS W/ TWL LRG LVL3 (GOWN DISPOSABLE) ×2 IMPLANT
GOWN STRL REUS W/TWL LRG LVL3 (GOWN DISPOSABLE) ×3
GRAFT MYRIAD 3 LAYER 10X10 (Graft) IMPLANT
HANDPIECE INTERPULSE COAX TIP (DISPOSABLE)
HEMOSTAT POWDER SURGIFOAM 1G (HEMOSTASIS) IMPLANT
HEMOSTAT SURGICEL 2X14 (HEMOSTASIS) IMPLANT
KIT BASIN OR (CUSTOM PROCEDURE TRAY) ×1 IMPLANT
KIT SUCTION CATH 14FR (SUCTIONS) IMPLANT
KIT TURNOVER KIT B (KITS) ×1 IMPLANT
NS IRRIG 1000ML POUR BTL (IV SOLUTION) ×1 IMPLANT
PACK GENERAL/GYN (CUSTOM PROCEDURE TRAY) ×1 IMPLANT
PAD ARMBOARD 7.5X6 YLW CONV (MISCELLANEOUS) ×2 IMPLANT
SET HNDPC FAN SPRY TIP SCT (DISPOSABLE) ×1 IMPLANT
SOL PREP POV-IOD 4OZ 10% (MISCELLANEOUS) IMPLANT
SOL SCRUB PVP POV-IOD 4OZ 7.5% (MISCELLANEOUS) ×1
SOLUTION SCRB POV-IOD 4OZ 7.5% (MISCELLANEOUS) IMPLANT
SPONGE T-LAP 18X18 ~~LOC~~+RFID (SPONGE) ×4 IMPLANT
SPONGE T-LAP 4X18 ~~LOC~~+RFID (SPONGE) ×1 IMPLANT
STAPLER VISISTAT 35W (STAPLE) IMPLANT
SURGILUBE 2OZ TUBE FLIPTOP (MISCELLANEOUS) IMPLANT
SUT ETHILON 3 0 FSL (SUTURE) IMPLANT
SUT PDS AB 2-0 CT1 27 (SUTURE) IMPLANT
SUT PDS AB 3-0 SH 27 (SUTURE) IMPLANT
SUT VIC AB 1 CTX 36 (SUTURE)
SUT VIC AB 1 CTX36XBRD ANBCTR (SUTURE) IMPLANT
SUT VIC AB 2-0 CTX 27 (SUTURE) IMPLANT
SUT VIC AB 3-0 X1 27 (SUTURE) IMPLANT
SUT VIC AB 5-0 PS2 18 (SUTURE) IMPLANT
SWAB COLLECTION DEVICE MRSA (MISCELLANEOUS) IMPLANT
SWAB CULTURE ESWAB REG 1ML (MISCELLANEOUS) IMPLANT
SYR BULB IRRIG 60ML STRL (SYRINGE) IMPLANT
TAPE CLOTH SURG 4X10 WHT LF (GAUZE/BANDAGES/DRESSINGS) IMPLANT
TOWEL GREEN STERILE (TOWEL DISPOSABLE) ×1 IMPLANT
TOWEL GREEN STERILE FF (TOWEL DISPOSABLE) ×1 IMPLANT
TRAY FOLEY MTR SLVR 16FR STAT (SET/KITS/TRAYS/PACK) IMPLANT
WATER STERILE IRR 1000ML POUR (IV SOLUTION) ×1 IMPLANT

## 2023-01-23 NOTE — Interval H&P Note (Signed)
History and Physical Interval Note:  01/23/2023 2:43 PM  Bryan Brock  has presented today for surgery, with the diagnosis of STERNAL WOUND INFECTION.  The various methods of treatment have been discussed with the patient and family. After consideration of risks, benefits and other options for treatment, the patient has consented to  Procedure(s): Excision of sternal wound with Myriad (N/A) APPLICATION OF WOUND VAC (N/A) as a surgical intervention.  The patient's history has been reviewed, patient examined, no change in status, stable for surgery.  I have reviewed the patient's chart and labs.  Questions were answered to the patient's satisfaction.     Alena Bills Christi Wirick

## 2023-01-23 NOTE — Progress Notes (Signed)
Occupational Therapy Treatment Patient Details Name: Bryan Brock MRN: [id number] DOB: 1956-04-08 Today's Date: 01/23/2023   History of present illness 67 yo male admitted 5/9 for CABG x 4. 5/14 sternal rewiring due to dehiscence coughing. 5/27 sternal wound I&D with VAC placement. PMhx: HTN, HLD, bipolar d/o, obesity, sleep apnea, mitral valve prolapse, GERD.   OT comments  Patient received in supine and anxious over procedure to be done today and  complaints of BLE neuropathy pain. Patient declined getting to EOB due to pain and did not want to WB through feet. OT provided education and demonstration on AE for LB dressing with reacher, dressing stick, LH shoehorn and sponge, sock aide and toilet aide. Patient pleased with education and would benefit from further education. Acute OT to continue to follow.    Recommendations for follow up therapy are one component of a multi-disciplinary discharge planning process, led by the attending physician.  Recommendations may be updated based on patient status, additional functional criteria and insurance authorization.    Assistance Recommended at Discharge Intermittent Supervision/Assistance  Patient can return home with the following  Assist for transportation;Assistance with cooking/housework;A lot of help with bathing/dressing/bathroom;A little help with walking and/or transfers   Equipment Recommendations  None recommended by OT    Recommendations for Other Services      Precautions / Restrictions Precautions Precautions: Sternal;Fall;Other (comment) Precaution Booklet Issued: No Precaution Comments: Watch SpO2, VAC Restrictions Weight Bearing Restrictions: Yes RUE Weight Bearing: Non weight bearing LUE Weight Bearing: Non weight bearing Other Position/Activity Restrictions: sternal       Mobility Bed Mobility Overal bed mobility: Needs Assistance             General bed mobility comments: declined OOB due to BLE pain     Transfers Overall transfer level: Needs assistance                 General transfer comment: Declined due to BLE pain     Balance                                           ADL either performed or assessed with clinical judgement   ADL Overall ADL's : Needs assistance/impaired                     Lower Body Dressing: Maximal assistance;Sit to/from stand Lower Body Dressing Details (indicate cue type and reason): AE training with reacher, sock aide, LH shoehorn, LH sponge, dressing stick, and sock aide               General ADL Comments: focused on AE training for LB dressing and bathing    Extremity/Trunk Assessment              Vision       Perception     Praxis      Cognition Arousal/Alertness: Awake/alert Behavior During Therapy: WFL for tasks assessed/performed, Anxious Overall Cognitive Status: Within Functional Limits for tasks assessed                                 General Comments: aware of precautions and situation        Exercises      Shoulder Instructions       General Comments Provided education on AE for LB bathing  and dressing and toileting    Pertinent Vitals/ Pain       Pain Assessment Pain Assessment: Faces Faces Pain Scale: Hurts even more Pain Location: L foot (forefoot/dorsal surface); sternal incision, BLE feet Pain Descriptors / Indicators: Discomfort, Grimacing, Tingling Pain Intervention(s): Limited activity within patient's tolerance, Monitored during session  Home Living                                          Prior Functioning/Environment              Frequency  Min 1X/week        Progress Toward Goals  OT Goals(current goals can now be found in the care plan section)  Progress towards OT goals: Not progressing toward goals - comment (limited due to pain)  Acute Rehab OT Goals Patient Stated Goal: get better OT Goal Formulation:  With patient Time For Goal Achievement: 01/24/23 Potential to Achieve Goals: Good ADL Goals Pt Will Perform Grooming: with supervision;standing Pt Will Perform Upper Body Dressing: with supervision;sitting Pt Will Perform Lower Body Dressing: with min assist;sit to/from stand Pt Will Transfer to Toilet: with supervision;ambulating;regular height toilet Pt/caregiver will Perform Home Exercise Program: Increased ROM;Both right and left upper extremity  Plan Discharge plan remains appropriate    Co-evaluation                 AM-PAC OT "6 Clicks" Daily Activity     Outcome Measure   Help from another person eating meals?: None Help from another person taking care of personal grooming?: None Help from another person toileting, which includes using toliet, bedpan, or urinal?: A Lot Help from another person bathing (including washing, rinsing, drying)?: A Lot Help from another person to put on and taking off regular upper body clothing?: A Lot Help from another person to put on and taking off regular lower body clothing?: A Lot 6 Click Score: 16    End of Session    OT Visit Diagnosis: Unsteadiness on feet (R26.81);Muscle weakness (generalized) (M62.81)   Activity Tolerance Patient limited by pain   Patient Left in bed;with call bell/phone within reach;with bed alarm set   Nurse Communication Mobility status        Time: 4098-1191 OT Time Calculation (min): 26 min  Charges: OT General Charges $OT Visit: 1 Visit OT Treatments $Self Care/Home Management : 23-37 mins  Alfonse Flavors, OTA Acute Rehabilitation Services  Office [phone]   Dewain Penning 01/23/2023, 8:04 AM

## 2023-01-23 NOTE — Plan of Care (Signed)
  Problem: Education: Goal: Knowledge of General Education information will improve Description: Including pain rating scale, medication(s)/side effects and non-pharmacologic comfort measures Outcome: Progressing   Problem: Health Behavior/Discharge Planning: Goal: Ability to manage health-related needs will improve Outcome: Progressing   Problem: Clinical Measurements: Goal: Ability to maintain clinical measurements within normal limits will improve Outcome: Progressing Goal: Will remain free from infection Outcome: Progressing Goal: Diagnostic test results will improve Outcome: Progressing Goal: Respiratory complications will improve Outcome: Progressing Goal: Cardiovascular complication will be avoided Outcome: Progressing   Problem: Activity: Goal: Risk for activity intolerance will decrease Outcome: Progressing   Problem: Nutrition: Goal: Adequate nutrition will be maintained Outcome: Progressing   Problem: Coping: Goal: Level of anxiety will decrease Outcome: Progressing   Problem: Elimination: Goal: Will not experience complications related to bowel motility Outcome: Progressing   Problem: Pain Managment: Goal: General experience of comfort will improve Outcome: Progressing   Problem: Safety: Goal: Ability to remain free from injury will improve Outcome: Progressing   Problem: Skin Integrity: Goal: Risk for impaired skin integrity will decrease Outcome: Progressing   Problem: Education: Goal: Will demonstrate proper wound care and an understanding of methods to prevent future damage Outcome: Progressing Goal: Knowledge of disease or condition will improve Outcome: Progressing Goal: Knowledge of the prescribed therapeutic regimen will improve Outcome: Progressing Goal: Individualized Educational Video(s) Outcome: Progressing   Problem: Activity: Goal: Risk for activity intolerance will decrease Outcome: Progressing   Problem: Clinical  Measurements: Goal: Postoperative complications will be avoided or minimized Outcome: Progressing   Problem: Respiratory: Goal: Respiratory status will improve Outcome: Progressing   Problem: Skin Integrity: Goal: Wound healing without signs and symptoms of infection Outcome: Progressing Goal: Risk for impaired skin integrity will decrease Outcome: Progressing   Problem: Education: Goal: Ability to describe self-care measures that may prevent or decrease complications (Diabetes Survival Skills Education) will improve Outcome: Progressing Goal: Individualized Educational Video(s) Outcome: Progressing   Problem: Coping: Goal: Ability to adjust to condition or change in health will improve Outcome: Progressing   Problem: Fluid Volume: Goal: Ability to maintain a balanced intake and output will improve Outcome: Progressing   Problem: Health Behavior/Discharge Planning: Goal: Ability to identify and utilize available resources and services will improve Outcome: Progressing Goal: Ability to manage health-related needs will improve Outcome: Progressing   Problem: Metabolic: Goal: Ability to maintain appropriate glucose levels will improve Outcome: Progressing   Problem: Nutritional: Goal: Maintenance of adequate nutrition will improve Outcome: Progressing Goal: Progress toward achieving an optimal weight will improve Outcome: Progressing   Problem: Skin Integrity: Goal: Risk for impaired skin integrity will decrease Outcome: Progressing   Problem: Tissue Perfusion: Goal: Adequacy of tissue perfusion will improve Outcome: Progressing

## 2023-01-23 NOTE — Transfer of Care (Signed)
Immediate Anesthesia Transfer of Care Note  Patient: Bryan Brock  Procedure(s) Performed: Excision of sternal wound with Myriad APPLICATION OF WOUND VAC  Patient Location: PACU  Anesthesia Type:General  Level of Consciousness: awake, patient cooperative, and responds to stimulation  Airway & Oxygen Therapy: Patient Spontanous Breathing and Patient connected to nasal cannula oxygen  Post-op Assessment: Report given to RN and Post -op Vital signs reviewed and stable  Post vital signs: Reviewed and stable  Last Vitals:  Vitals Value Taken Time  BP 130/76 01/23/23 1615  Temp    Pulse 66 01/23/23 1615  Resp 14 01/23/23 1615  SpO2 92 % 01/23/23 1615  Vitals shown include unvalidated device data.  Last Pain:  Vitals:   01/23/23 1335  TempSrc: Oral  PainSc:       Patients Stated Pain Goal: 0 (01/23/23 0645)  Complications: No notable events documented.

## 2023-01-23 NOTE — Interval H&P Note (Signed)
History and Physical Interval Note:  01/23/2023 1:09 PM  Bryan Brock  has presented today for surgery, with the diagnosis of STERNAL WOUND INFECTION.  The various methods of treatment have been discussed with the patient and family. After consideration of risks, benefits and other options for treatment, the patient has consented to  Procedure(s): Excision of sternal wound with Myriad (N/A) APPLICATION OF WOUND VAC (N/A) as a surgical intervention.  The patient's history has been reviewed, patient examined, no change in status, stable for surgery.  I have reviewed the patient's chart and labs.  Questions were answered to the patient's satisfaction.     [name]

## 2023-01-23 NOTE — Progress Notes (Addendum)
[address]       [place] [address]             [phone]      3 Days Post-Op Procedure(s) (LRB): DRAIN STERNAL INFECTION (N/A) APPLICATION OF WOUND VAC (N/A) Subjective: Pt states he has had pain in his feet that feels like neuropathy since yesterday but has never had this before. He also states he had more shortness of breath last night.  Objective: Vital signs in last 24 hours: Temp:  [97.9 F (36.6 C)-98.8 F (37.1 C)] 98.8 F (37.1 C) (05/30 0312) Pulse Rate:  [67-86] 73 (05/30 0312) Cardiac Rhythm: Normal sinus rhythm (05/29 1913) Resp:  [18-21] 20 (05/30 0645) BP: (99-138)/(52-87) 124/78 (05/30 0312) SpO2:  [96 %-100 %] 98 % (05/30 0312) Weight:  [139.1 kg] 139.1 kg (05/30 0312)  Hemodynamic parameters for last 24 hours:    Intake/Output from previous day: 05/29 0701 - 05/30 0700 In: 1700 [P.O.:1200; IV Piggyback:500] Out: 1830 [Urine:1700; Drains:130] Intake/Output this shift: No intake/output data recorded.  General appearance: alert, cooperative, and no distress Neurologic: intact Heart: Regular rate and rhythm, some PVCs, no murmur Lungs: Diminished at the left base Abdomen: soft, non-tender; bowel sounds normal; no masses,  no organomegaly Extremities: edema 2+, DP/PT pulses 2+ Wound: Wound VAC with good seal, serosanguinous drainage. EVH sites are clean and dry without sign of infection. Some ecchymosis especially around left EVH site improving.  Lab Results: Recent Labs    01/21/23 0101 01/22/23 0247  WBC 9.5 9.6  HGB 9.1* 9.6*  HCT 29.1* 30.7*  PLT 496* 470*   BMET:  Recent Labs    01/22/23 0247 01/23/23 0224  NA 131* 135  K 3.2* 3.1*  CL 89* 92*  CO2 32 32  GLUCOSE 107* 113*  BUN 31* 29*  CREATININE 1.49* 1.32*  CALCIUM 8.1* 8.4*    PT/INR: No results for input(s): "LABPROT", "INR" in the last 72 hours. 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 This is new for him. Pt has 2+ pitting edema, holding Lasix due to elevation in creatinine, will add TED hose. On Gabapentin 200mg  BID, will increase to 200mg  TID.   CV: NSR, HR 70s. SBP 124. Hx of afib. Continue Amiodarone 200mg  BID and Lopressor 37.5mg  BID.   Pulm: Saturating 100% on 1L [place]. Some increased dyspnea last night, pt feels mucus is thicker and harder to cough up. CXR yesterday with stable small right pneumothorax, stable small left pleural effusion and opacities in left mid and lower lung likely atelectasis. Continue Albuterol, Pulmicort, Mucinex and Tussionex. Encourage IS, flutter valve and ambulation.   GI: Good appetite, no nausea. +BM. On Reglan for possible reflux causing cough.    Endo: CBGs controlled on SSI. New diabetes diagnosis A1C 6.6, outpatient appt with PCP has been arranged.   Renal: AKI likely due to diuresis, improved today Cr 1.32. Continue to hold Lasix. UO 1700cc/24hrs. K 3.1, aggressively supplemented yesterday, continue aggressive supplementation.   ID: Leukocytosis resolved. WBC 9.6, afebrile.  On IV Cefepime and IV Vancomycin for sternal wound. Cultures negative for growth.   Expected postop ABLA: Last H/H stable 9.6/30.7   Hyponatremia: Resolved Na 135   Sternal dehiscence: Wound VAC working properly, good seal. Reported 130cc drainage/24hrs but only about 75cc since my mark yesterday AM. OR today for wound VAC change. Plastics consulted and will evaluate wound in OR today.   Deconditioning: Continue work with PT.   Dispo: OR today for wound VAC change and plastics evaluation.   LOS: 21 days     Jenny Reichmann, PA-C 01/23/2023   Chart reviewed, patient examined, agree with above. He looks good. Reported some SOB last night but seems fine today and able to talk continuously without stopping. Plan debridement of wound this afternoon with Dr. Ulice Bold.

## 2023-01-23 NOTE — Anesthesia Preprocedure Evaluation (Signed)
Anesthesia Evaluation  Patient identified by MRN, date of birth, ID band Patient awake    Reviewed: Allergy & Precautions, NPO status , Patient's Chart, lab work & pertinent test results  History of Anesthesia Complications (+) PONV and history of anesthetic complications  Airway Mallampati: III  TM Distance: >3 FB Neck ROM: Full    Dental  (+) Teeth Intact, Dental Advisory Given   Pulmonary asthma , sleep apnea    breath sounds clear to auscultation       Cardiovascular hypertension, + CAD, + CABG and + Peripheral Vascular Disease  + dysrhythmias + Valvular Problems/Murmurs MVP  Rhythm:Regular Rate:Normal  Echo: 1. Left ventricular ejection fraction, by estimation, is 55 to 60%. The  left ventricle has normal function. The left ventricle has no regional  wall motion abnormalities. There is mild concentric left ventricular  hypertrophy. Left ventricular diastolic  parameters are consistent with Grade I diastolic dysfunction (impaired  relaxation). Elevated left atrial pressure.   2. Right ventricular systolic function is normal. The right ventricular  size is normal.   3. Left atrial size was moderately dilated.   4. The mitral valve is normal in structure. No evidence of mitral valve  regurgitation.   5. The aortic valve is tricuspid. Aortic valve regurgitation is not  visualized. No aortic stenosis is present.     Neuro/Psych  PSYCHIATRIC DISORDERS Anxiety Depression Bipolar Disorder      GI/Hepatic hiatal hernia,GERD  ,,(+) Hepatitis -  Endo/Other  negative endocrine ROS    Renal/GU negative Renal ROS     Musculoskeletal  (+) Arthritis ,    Abdominal   Peds  Hematology negative hematology ROS (+)   Anesthesia Other Findings   Reproductive/Obstetrics                             Anesthesia Physical Anesthesia Plan  ASA: 3  Anesthesia Plan: General   Post-op Pain Management:     Induction: Intravenous  PONV Risk Score and Plan: 3 and Ondansetron, Midazolam and Treatment may vary due to age or medical condition  Airway Management Planned: LMA  Additional Equipment:   Intra-op Plan:   Post-operative Plan: Extubation in OR  Informed Consent: I have reviewed the patients History and Physical, chart, labs and discussed the procedure including the risks, benefits and alternatives for the proposed anesthesia with the patient or authorized representative who has indicated his/her understanding and acceptance.     Dental advisory given  Plan Discussed with: CRNA  Anesthesia Plan Comments:        Anesthesia Quick Evaluation  

## 2023-01-23 NOTE — Op Note (Signed)
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 male born on 1956/02/22, is here for treatment of a sternal wound.   PROCEDURE DETAILS:  The patient was seen prior to surgery and marked.  The IV antibiotics were given. The patient was taken to the operating room and given a general anesthetic. A standard time out was performed and all information was confirmed by those in the room. SCDs were placed.   The patient was prepped and draped.  The patient was irrigated with Vashe.  There was no sign of infection.  There was biofilm noted and this was excised with soft tissue and skin from the 5 x 15 cm wound.  Undermining was done for 4 cm of the superior wound and 1 cm of the inferior wound.  This was to improve the ability to close a portion of the wound. This was done with deep 2-0 PDS followed by 3-0 PDS vertical mattress sutures.  All of the myriad powder and sheet was applied.  This was covered with the sorbact and a white VAC sponge.  The black sponge was then applied and there was an excellent seal.   The patient was allowed to wake up and taken to recovery room in stable condition at the end of the case. The family was notified at the end of the case.   The advanced practice practitioner (APP) assisted throughout the case.  The APP was essential in retraction and counter traction when needed to make the case progress smoothly.  This retraction and assistance made it possible to see the tissue plans for the procedure.  The assistance  was needed for blood control, tissue re-approximation and assisted with closure of the incision site.

## 2023-01-23 NOTE — H&P (View-Only) (Signed)
    [address]       [place] [address]             [phone]      3 Days Post-Op Procedure(s) (LRB): DRAIN STERNAL INFECTION (N/A) APPLICATION OF WOUND VAC (N/A) Subjective: Pt states he has had pain in his feet that feels like neuropathy since yesterday but has never had this before. He also states he had more shortness of breath last night.  Objective: Vital signs in last 24 hours: Temp:  [97.9 F (36.6 C)-98.8 F (37.1 C)] 98.8 F (37.1 C) (05/30 0312) Pulse Rate:  [67-86] 73 (05/30 0312) Cardiac Rhythm: Normal sinus rhythm (05/29 1913) Resp:  [18-21] 20 (05/30 0645) BP: (99-138)/(52-87) 124/78 (05/30 0312) SpO2:  [96 %-100 %] 98 % (05/30 0312) Weight:  [139.1 kg] 139.1 kg (05/30 0312)  Hemodynamic parameters for last 24 hours:    Intake/Output from previous day: 05/29 0701 - 05/30 0700 In: 1700 [P.O.:1200; IV Piggyback:500] Out: 1830 [Urine:1700; Drains:130] Intake/Output this shift: No intake/output data recorded.  General appearance: alert, cooperative, and no distress Neurologic: intact Heart: Regular rate and rhythm, some PVCs, no murmur Lungs: Diminished at the left base Abdomen: soft, non-tender; bowel sounds normal; no masses,  no organomegaly Extremities: edema 2+, DP/PT pulses 2+ Wound: Wound VAC with good seal, serosanguinous drainage. EVH sites are clean and dry without sign of infection. Some ecchymosis especially around left EVH site improving.  Lab Results: Recent Labs    01/21/23 0101 01/22/23 0247  WBC 9.5 9.6  HGB 9.1* 9.6*  HCT 29.1* 30.7*  PLT 496* 470*   BMET:  Recent Labs    01/22/23 0247 01/23/23 0224  NA 131* 135  K 3.2* 3.1*  CL 89* 92*  CO2 32 32  GLUCOSE 107* 113*  BUN 31* 29*  CREATININE 1.49* 1.32*  CALCIUM 8.1* 8.4*    PT/INR: No results for input(s): "LABPROT", "INR" in the last 72 hours. 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 This is new for him. Pt has 2+ pitting edema, holding Lasix due to elevation in creatinine, will add TED hose. On Gabapentin 200mg BID, will increase to 200mg TID.   CV: NSR, HR 70s. SBP 124. Hx of afib. Continue Amiodarone 200mg BID and Lopressor 37.5mg BID.   Pulm: Saturating 100% on 1L [place]. Some increased dyspnea last night, pt feels mucus is thicker and harder to cough up. CXR yesterday with stable small right pneumothorax, stable small left pleural effusion and opacities in left mid and lower lung likely atelectasis. Continue Albuterol, Pulmicort, Mucinex and Tussionex. Encourage IS, flutter valve and ambulation.   GI: Good appetite, no nausea. +BM. On Reglan for possible reflux causing cough.    Endo: CBGs controlled on SSI. New diabetes diagnosis A1C 6.6, outpatient appt with PCP has been arranged.   Renal: AKI likely due to diuresis, improved today Cr 1.32. Continue to hold Lasix. UO 1700cc/24hrs. K 3.1, aggressively supplemented yesterday, continue aggressive supplementation.   ID: Leukocytosis resolved. WBC 9.6, afebrile.   On IV Cefepime and IV Vancomycin for sternal wound. Cultures negative for growth.   Expected postop ABLA: Last H/H stable 9.6/30.7   Hyponatremia: Resolved Na 135   Sternal dehiscence: Wound VAC working properly, good seal. Reported 130cc drainage/24hrs but only about 75cc since my mark yesterday AM. OR today for wound VAC change. Plastics consulted and will evaluate wound in OR today.   Deconditioning: Continue work with PT.   Dispo: OR today for wound VAC change and plastics evaluation.   LOS: 21 days     Bailey C Stehler, PA-C 01/23/2023   Chart reviewed, patient examined, agree with above. He looks good. Reported some SOB last night but seems fine today and able to talk continuously without stopping. Plan debridement of wound this afternoon with Dr. Dillingham. 

## 2023-01-23 NOTE — Anesthesia Procedure Notes (Signed)
Procedure Name: Intubation Date/Time: 01/23/2023 3:13 PM  Performed by: Ayesha Rumpf, CRNAPre-anesthesia Checklist: Patient identified, Emergency Drugs available, Suction available and Patient being monitored Patient Re-evaluated:Patient Re-evaluated prior to induction Oxygen Delivery Method: Circle System Utilized Preoxygenation: Pre-oxygenation with 100% oxygen Induction Type: IV induction Ventilation: Mask ventilation without difficulty and Oral airway inserted - appropriate to patient size Laryngoscope Size: Glidescope and 4 Grade View: Grade I Tube type: Oral Tube size: 7.5 mm Number of attempts: 1 Airway Equipment and Method: Stylet and Oral airway Placement Confirmation: ETT inserted through vocal cords under direct vision, positive ETCO2 and breath sounds checked- equal and bilateral Secured at: 23 cm Tube secured with: Tape Dental Injury: Teeth and Oropharynx as per pre-operative assessment

## 2023-01-24 ENCOUNTER — Inpatient Hospital Stay (HOSPITAL_COMMUNITY): Payer: Medicare Other

## 2023-01-24 LAB — CBC
HCT: 28.4 % — ABNORMAL LOW (ref 39.0–52.0)
Hemoglobin: 8.9 g/dL — ABNORMAL LOW (ref 13.0–17.0)
MCH: 30 pg (ref 26.0–34.0)
MCHC: 31.3 g/dL (ref 30.0–36.0)
MCV: 95.6 fL (ref 80.0–100.0)
Platelets: 376 10*3/uL (ref 150–400)
RBC: 2.97 MIL/uL — ABNORMAL LOW (ref 4.22–5.81)
RDW: 14.5 % (ref 11.5–15.5)
WBC: 8.4 10*3/uL (ref 4.0–10.5)
nRBC: 0 % (ref 0.0–0.2)

## 2023-01-24 LAB — GLUCOSE, CAPILLARY
Glucose-Capillary: 116 mg/dL — ABNORMAL HIGH (ref 70–99)
Glucose-Capillary: 121 mg/dL — ABNORMAL HIGH (ref 70–99)
Glucose-Capillary: 139 mg/dL — ABNORMAL HIGH (ref 70–99)
Glucose-Capillary: 140 mg/dL — ABNORMAL HIGH (ref 70–99)
Glucose-Capillary: 175 mg/dL — ABNORMAL HIGH (ref 70–99)
Glucose-Capillary: 97 mg/dL (ref 70–99)

## 2023-01-24 LAB — BASIC METABOLIC PANEL
Anion gap: 9 (ref 5–15)
BUN: 20 mg/dL (ref 8–23)
CO2: 30 mmol/L (ref 22–32)
Calcium: 8.3 mg/dL — ABNORMAL LOW (ref 8.9–10.3)
Chloride: 93 mmol/L — ABNORMAL LOW (ref 98–111)
Creatinine, Ser: 1.23 mg/dL (ref 0.61–1.24)
GFR, Estimated: >60 mL/min (ref >60)
Glucose, Bld: 140 mg/dL — ABNORMAL HIGH (ref 70–99)
Potassium: 2.8 mmol/L — ABNORMAL LOW (ref 3.5–5.1)
Sodium: 132 mmol/L — ABNORMAL LOW (ref 135–145)

## 2023-01-24 LAB — MAGNESIUM: Magnesium: 2 mg/dL (ref 1.7–2.4)

## 2023-01-24 MED ORDER — POTASSIUM CHLORIDE CRYS ER 20 MEQ PO TBCR: 40.0000 meq | EXTENDED_RELEASE_TABLET | Freq: Once | ORAL | Status: DC

## 2023-01-24 MED ORDER — CEPHALEXIN 500 MG PO CAPS
500.0000 mg | ORAL_CAPSULE | Freq: Three times a day (TID) | ORAL | Status: AC
  Administered 2023-01-24 – 2023-01-30 (×26): 500 mg via ORAL
  Filled 2023-01-24 (×26): qty 1

## 2023-01-24 MED ORDER — POTASSIUM CHLORIDE CRYS ER 20 MEQ PO TBCR
20.0000 meq | EXTENDED_RELEASE_TABLET | ORAL | Status: DC
  Administered 2023-01-24: 20 meq via ORAL
  Filled 2023-01-24: qty 1

## 2023-01-24 MED ORDER — GABAPENTIN 100 MG PO CAPS
200.0000 mg | ORAL_CAPSULE | Freq: Two times a day (BID) | ORAL | Status: DC
  Administered 2023-01-24 – 2023-02-04 (×22): 200 mg via ORAL
  Filled 2023-01-24 (×22): qty 2

## 2023-01-24 MED ORDER — POTASSIUM CHLORIDE CRYS ER 20 MEQ PO TBCR
40.0000 meq | EXTENDED_RELEASE_TABLET | Freq: Three times a day (TID) | ORAL | Status: AC
  Administered 2023-01-24 (×3): 40 meq via ORAL
  Filled 2023-01-24 (×3): qty 2

## 2023-01-24 MED ORDER — MAGNESIUM OXIDE -MG SUPPLEMENT 400 (240 MG) MG PO TABS
400.0000 mg | ORAL_TABLET | Freq: Two times a day (BID) | ORAL | Status: AC
  Administered 2023-01-24 – 2023-01-25 (×3): 400 mg via ORAL
  Filled 2023-01-24 (×3): qty 1

## 2023-01-24 NOTE — Progress Notes (Addendum)
Physical Therapy Treatment Patient Details Name: Bryan Brock MRN: [id number] DOB: [birth year] Today's Date: 01/24/2023   History of Present Illness 67 yo male admitted 5/9 for CABG x 4. 5/14 sternal rewiring due to dehiscence coughing. 5/27 sternal wound I&D with VAC placement. PMhx: HTN, HLD, bipolar d/o, obesity, sleep apnea, mitral valve prolapse, GERD.    PT Comments    Pt supine in bed, reports he is too worn down after surgery yesterday and working with Mobility Specialist this morning. Pt only agreeable to bed level exercise. Pt able to recall his sternal precautions and has been weaned to RA. Goals have been reviewed and updated based on Mobility Specialist and PTA notes of the last two days. Pt promised to work with PT in next session.    Recommendations for follow up therapy are one component of a multi-disciplinary discharge planning process, led by the attending physician.  Recommendations may be updated based on patient status, additional functional criteria and insurance authorization.     Assistance Recommended at Discharge Intermittent Supervision/Assistance  Patient can return home with the following A little help with walking and/or transfers;A little help with bathing/dressing/bathroom;Assistance with cooking/housework;Assist for transportation;Help with stairs or ramp for entrance   Equipment Recommendations  Rolling walker (2 wheels);Other (comment) (Bariatric)    Recommendations for Other Services       Precautions / Restrictions Precautions Precautions: Sternal;Fall;Other (comment) Precaution Booklet Issued: No Precaution Comments: Watch SpO2, VAC Restrictions Weight Bearing Restrictions: Yes RUE Weight Bearing: Non weight bearing LUE Weight Bearing: Non weight bearing Other Position/Activity Restrictions: sternal     Mobility  Bed Mobility               General bed mobility comments: declined OOB due to BLE pain    Transfers                    General transfer comment: Declined due to fatigue and pain           Cognition Arousal/Alertness: Awake/alert Behavior During Therapy: WFL for tasks assessed/performed, Anxious Overall Cognitive Status: Within Functional Limits for tasks assessed                                 General Comments: aware of precautions and situation        Exercises General Exercises - Lower Extremity Ankle Circles/Pumps: AROM, Both, 20 reps, Supine Quad Sets: AROM, Both, 10 reps, Supine Gluteal Sets: AROM, Both, 10 reps, Supine Heel Slides: AROM, Both, 10 reps, Supine Hip ABduction/ADduction: AROM, Both, 10 reps, Supine Straight Leg Raises: AROM, Both, 10 reps, Supine    General Comments General comments (skin integrity, edema, etc.): Pt 99%O2 on RA. With increased time pt able to recall all of his sternal precautions, has to be reminded not to demonstrate the things he is not supposed to do when recall the precautions      Pertinent Vitals/Pain Pain Assessment Pain Assessment: Faces Faces Pain Scale: Hurts even more Pain Location: L foot (forefoot/dorsal surface); sternal incision, BLE feet Pain Descriptors / Indicators: Discomfort, Grimacing, Tingling Pain Intervention(s): Limited activity within patient's tolerance, Monitored during session, Repositioned     PT Goals (current goals can now be found in the care plan section) Acute Rehab PT Goals PT Goal Formulation: With patient Time For Goal Achievement: 02/07/23 Potential to Achieve Goals: Good Progress towards PT goals: Progressing toward goals    Frequency  Min 1X/week      PT Plan Current plan remains appropriate       AM-PAC PT "6 Clicks" Mobility   Outcome Measure  Help needed turning from your back to your side while in a flat bed without using bedrails?: A Little Help needed moving from lying on your back to sitting on the side of a flat bed without using bedrails?: A Little Help needed  moving to and from a bed to a chair (including a wheelchair)?: A Little Help needed standing up from a chair using your arms (e.g., wheelchair or bedside chair)?: A Little Help needed to walk in hospital room?: A Little Help needed climbing 3-5 steps with a railing? : A Little 6 Click Score: 18    End of Session   Activity Tolerance: Patient limited by fatigue Patient left: in bed;with call bell/phone within reach Nurse Communication: Mobility status PT Visit Diagnosis: Other abnormalities of gait and mobility (R26.89);Difficulty in walking, not elsewhere classified (R26.2)     Time: 4098-1191 PT Time Calculation (min) (ACUTE ONLY): 22 min  Charges:  $Therapeutic Exercise: 8-22 mins                     Armanie Martine B. Beverely Risen PT, DPT Acute Rehabilitation Services Please use secure chat or  Call Office [phone]    [name] 01/24/2023, 3:54 PM

## 2023-01-24 NOTE — Progress Notes (Addendum)
[address]       [place] [address]             [phone]      1 Day Post-Op Procedure(s) (LRB): Excision of sternal wound with Myriad (N/A) APPLICATION OF WOUND VAC (N/A) Subjective: Pt states he feels much better today, his breathing and neuropathy has improved.   Objective: Vital signs in last 24 hours: Temp:  [97.8 F (36.6 C)-98.5 F (36.9 C)] 98.1 F (36.7 C) (05/31 0543) Pulse Rate:  [66-84] 83 (05/31 0543) Cardiac Rhythm: Normal sinus rhythm (05/30 2020) Resp:  [14-20] 18 (05/31 0543) BP: (111-141)/(60-78) 126/61 (05/31 0543) SpO2:  [90 %-99 %] 96 % (05/31 0543) Weight:  [138.8 kg] 138.8 kg (05/31 0543)  Hemodynamic parameters for last 24 hours:    Intake/Output from previous day: 05/30 0701 - 05/31 0700 In: 1253.3 [P.O.:480; I.V.:650; IV Piggyback:123.3] Out: 2935 [Urine:2800; Drains:125; Blood:10] Intake/Output this shift: No intake/output data recorded.  General appearance: alert, cooperative, and no distress Neurologic: intact Heart: regular rate and rhythm, S1, S2 normal, no murmur, click, rub or gallop Lungs: Diminished bibasilar Abdomen: soft, non-tender; bowel sounds normal; no masses,  no organomegaly Extremities: edema 2+, TED hose in place on left but not right due to pain Wound: Breast binder and clean and dry dressing in place over sternal incision, wound vac with about 300cc serosanguinous output, EVH sites are clean and dry with some ecchymosis but no sign of infection  Lab Results: Recent Labs    01/22/23 0247 01/24/23 0053  WBC 9.6 8.4  HGB 9.6* 8.9*  HCT 30.7* 28.4*  PLT 470* 376   BMET:  Recent Labs    01/23/23 0224 01/24/23 0053  NA 135 132*  K 3.1* 2.8*  CL 92* 93*  CO2 32 30  GLUCOSE 113* 140*  BUN 29* 20  CREATININE 1.32* 1.23  CALCIUM 8.4* 8.3*    PT/INR: No results for input(s): "LABPROT", "INR" in the last 72 hours. ABG    Component Value Date/Time   PHART 7.399 01/08/2023 0830    HCO3 28.2 (H) 01/08/2023 0830   TCO2 30 01/08/2023 0830   ACIDBASEDEF 1.0 01/07/2023 1936   O2SAT 97 01/08/2023 0830   CBG (last 3)  Recent Labs    01/24/23 0053 01/24/23 0417 01/24/23 0608  GLUCAP 139* 97 121*    Assessment/Plan: S/P Procedure(s) (LRB): Excision of sternal wound with Myriad (N/A) APPLICATION OF WOUND VAC (N/A)  Neuro: Neuropathy improved today   CV: NSR, HR 70s. SBP 126. Hx of afib. Continue Amiodarone 200mg  BID and Lopressor 37.5mg  BID.   Pulm: Saturating 96% on 2L [place]. CXR shows stable small right pneumothorax, stable small left pleural effusion and atelectasis.  Continue Albuterol, Pulmicort, Mucinex and Tussionex. Encourage IS, flutter valve and ambulation.   GI: Good appetite, no nausea. +BM. On Reglan for possible reflux causing cough.    Endo: CBGs controlled on SSI. New diabetes diagnosis A1C 6.6, outpatient appt with PCP has been arranged.   Renal: AKI likely due to diuresis, resolved Cr 1.23. Diuresing well without diuretic, continue to hold Lasix. UO 2800cc/24hrs. K 2.8, pt only received of the ordered yesterday, continue aggressive supplementation.   ID: Leukocytosis resolved. WBC 9.6, afebrile. On IV Cefepime and IV Vancomycin for sternal wound. Cultures negative for growth.   Expected postop ABLA: Last H/H stable 8.9/28.4   Hyponatremia: Mild, Na 132, possibly due to volume overload. Monitor.    Sternal dehiscence: POD1  excision of wound, placement of Myriad powder and VAC replacement. No sign of infection. Wound VAC working properly, good seal. Reported 125cc drainage/24hrs but about 300cc in reservoir. On IV abx, transition to PO?    Deconditioning: Continue work with PT.   Dispo: Discuss with surgeon transition to PO abx and dispo planning.    LOS: 22 days    Jenny Reichmann, PA-C 01/24/2023   Chart reviewed, patient examined, agree with above. He looks good and feels better. Breathing is good on 1L with sats 98%.  K+  down to 2.8. Replacement ordered.  His wound cultures have been negative and it looked good in the OR. Will switch to po Keflex for a week.   Planning to get home with wound VAC when ok with plastic surgery. I think he should be ok at home as long as he can get up by himself and walk around safely and is off oxygen.

## 2023-01-24 NOTE — Anesthesia Postprocedure Evaluation (Signed)
Anesthesia Post Note  Patient: Bryan Brock  Procedure(s) Performed: Excision of sternal wound with Myriad APPLICATION OF WOUND VAC     Patient location during evaluation: PACU Anesthesia Type: General Level of consciousness: awake and alert Pain management: pain level controlled Vital Signs Assessment: post-procedure vital signs reviewed and stable Respiratory status: spontaneous breathing, nonlabored ventilation, respiratory function stable and patient connected to nasal cannula oxygen Cardiovascular status: blood pressure returned to baseline and stable Postop Assessment: no apparent nausea or vomiting Anesthetic complications: no   No notable events documented.  Last Vitals:  Vitals:   01/24/23 1649 01/24/23 2052  BP: 104/72   Pulse: 74   Resp: (!) 21   Temp: 37.2 C   SpO2: 91% 96%    Last Pain:  Vitals:   01/24/23 1649  TempSrc: Oral  PainSc:                  Kennieth Rad

## 2023-01-24 NOTE — TOC Progression Note (Addendum)
Transition of Care [facility]) - Progression Note    Patient Details  Name: Bryan Brock MRN: [id number] Date of Birth: July 26, 1956  [facility] CM/SW Contact  Leone Haven, RN Phone Number: 01/24/2023, 3:59 PM  Clinical Narrative:    NCM received phone call at 1546 from Lowella Dandy PA stating patient will need to go home with a wound vac. This NCM left message for [facility] with [employer], awaiting call back.  Also patient has HH with [facility] for HHPT, HHOT, this NCM left message with Alyssa [phone] to see if they can add a HHRN to services.  [place] call back. This NCM received call back from [facility] with [employer], she is working on the wound vac, she states she had some information on him already.  Also the dc will be planned for tomorrow per Lowella Dandy PA.  NCM received call from Alyssa she states she can add HHRN to services for wound vac , soc will be Monday.     Expected Discharge Plan: Home w Home Health Services Barriers to Discharge: Continued Medical Work up  Expected Discharge Plan and Services In-house Referral: NA Discharge Planning Services: CM Consult Post Acute Care Choice: Durable Medical Equipment, Home Health Living arrangements for the past 2 months: Single Family Home                 DME Arranged: Walker rolling         HH Arranged: PT, OT, RN HH Agency: [facility]     Representative spoke with at [facility] Agency: Bjorn Loser   Social Determinants of Health (SDOH) Interventions SDOH Screenings   Food Insecurity: No Food Insecurity (01/05/2023)  Housing: Low Risk  (01/05/2023)  Transportation Needs: No Transportation Needs (01/05/2023)  Utilities: Not At Risk (01/05/2023)  Alcohol Screen: Low Risk  (03/05/2022)  Depression (PHQ2-9): Low Risk  (10/31/2022)  Financial Resource Strain: Low Risk  (03/05/2022)  Physical Activity: Inactive (03/05/2022)  Social Connections: Moderately Integrated (03/05/2022)  Stress: No Stress Concern Present  (03/05/2022)  Tobacco Use: High Risk (01/23/2023)    Readmission Risk Interventions     No data to display

## 2023-01-24 NOTE — Progress Notes (Signed)
Mobility Specialist Progress Note:   01/24/23 1059  Mobility  Activity Ambulated with assistance in hallway  Level of Assistance Minimal assist, patient does 75% or more  Assistive Device Front wheel walker  Distance Ambulated (ft) 140 ft  Activity Response Tolerated well  Mobility Referral Yes  $Mobility charge 1 Mobility  Mobility Specialist Start Time (ACUTE ONLY) 1012  Mobility Specialist Stop Time (ACUTE ONLY) 1036  Mobility Specialist Time Calculation (min) (ACUTE ONLY) 24 min   Pt eager for mobility session. Required no seated rest break during ambulation. Pt c/o significant foot pain, limiting gait distance. Pt left sitting up in chair with all needs met. VSS on RA, adhered to sternal precautions with min cues.   Thompson Grayer Mobility Specialist  Please contact vis Secure Chat or  Rehab Office [phone]

## 2023-01-24 NOTE — Progress Notes (Signed)
Pt's potassium is 2.8, creatinine is 1.23, urine output is WNL, initiated TCTS standing orders for low potassium

## 2023-01-24 NOTE — Progress Notes (Signed)
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  She is postop day 1 from excision of sternal wound skin and soft tissue, 5 cm intermediate closure of the wound, placement of myriad sheet and powder and placement of wound VAC to the wound with Dr. Ulice Bold.  Upon entering the room, patient is sitting comfortably in bed in no acute distress.  Patient states he is doing well.  He denies any acute events overnight.  He denies any issues or concerns at this time.  Objective: Vital signs in last 24 hours: Temp:  [97.8 F (36.6 C)-98.5 F (36.9 C)] 97.9 F (36.6 C) (05/31 0821) Pulse Rate:  [66-84] 68 (05/31 0821) Resp:  [14-20] 20 (05/31 0821) BP: (126-134)/(58-76) 134/58 (05/31 0821) SpO2:  [90 %-100 %] 98 % (05/31 0858) Weight:  [138.8 kg] 138.8 kg (05/31 0543) Last BM Date : 01/23/23 (per pt)  Intake/Output from previous day: 05/30 0701 - 05/31 0700 In: 1253.3 [P.O.:480; I.V.:650; IV Piggyback:123.3] Out: 2935 [Urine:2800; Drains:125; Blood:10] Intake/Output this shift: No intake/output data recorded.  General appearance: alert, cooperative, and no distress Resp: Unlabored, in no respiratory distress Chest wall: Wound VAC in place and functioning.  Sponge noted with adequate seal.  There is no leaking.  ABD pads are clean dry and intact.  Serosanguineous drainage is noted in the canister.  Lab Results:     Latest Ref Rng & Units 01/24/2023   12:53 AM 01/22/2023    2:47 AM 01/21/2023    1:01 AM  CBC  WBC 4.0 - 10.5 K/uL 8.4  9.6  9.5   Hemoglobin 13.0 - 17.0 g/dL 8.9  9.6  9.1   Hematocrit 39.0 - 52.0 % 28.4  30.7  29.1   Platelets 150 - 400 K/uL 376  470  496     BMET Recent Labs    01/23/23 0224 01/24/23 0053  NA 135 132*  K 3.1* 2.8*  CL 92* 93*  CO2 32 30  GLUCOSE 113* 140*  BUN 29* 20  CREATININE 1.32* 1.23  CALCIUM 8.4* 8.3*   PT/INR No results for input(s): "LABPROT", "INR" in the last 72 hours. ABG No  results for input(s): "PHART", "HCO3" in the last 72 hours.  Invalid input(s): "PCO2", "PO2"  Studies/Results: DG CHEST PORT 1 VIEW  Result Date: 01/24/2023 CLINICAL DATA:  Pneumothorax follow-up EXAM: PORTABLE CHEST 1 VIEW COMPARISON:  Two days ago FINDINGS: Trace remaining right apical pneumothorax. Unchanged hazy appearance of the chest with vascular congestion and small left pleural effusion. Stable postoperative heart size. IMPRESSION: Decrease in trace right apical pneumothorax.  No new finding. Electronically Signed   By: Tiburcio Pea M.D.   On: 01/24/2023 08:55    Anti-infectives: Anti-infectives (From admission, onward)    Start     Dose/Rate Route Frequency Ordered Stop   01/24/23 0800  vancomycin (VANCOREADY) IVPB 1500 mg/300 mL  Status:  Discontinued        1,500 mg 150 mL/hr over 120 Minutes Intravenous Every 24 hours 01/23/23 1303 01/24/23 0750   01/24/23 0800  cephALEXin (KEFLEX) capsule 500 mg        500 mg Oral 3 times daily before meals & bedtime 01/24/23 0750 01/30/23 2159   01/23/23 0600  ceFAZolin (ANCEF) IVPB 3g/100 mL premix        3 g 200 mL/hr over 30 Minutes Intravenous On call to O.R. 01/22/23 2011 01/23/23 1506   01/20/23 1400  ceFEPIme (MAXIPIME) 2  g in sodium chloride 0.9 % 100 mL IVPB  Status:  Discontinued        2 g 200 mL/hr over 30 Minutes Intravenous Every 8 hours 01/20/23 1017 01/24/23 0750   01/20/23 0600  vancomycin (VANCOREADY) IVPB 1500 mg/300 mL  Status:  Discontinued        1,500 mg 150 mL/hr over 120 Minutes Intravenous On call to O.R. 01/19/23 1849 01/19/23 1917   01/19/23 2015  vancomycin (VANCOREADY) IVPB 1500 mg/300 mL  Status:  Discontinued        1,500 mg 150 mL/hr over 120 Minutes Intravenous Every 24 hours 01/19/23 1917 01/23/23 1303   01/16/23 0745  cephALEXin (KEFLEX) capsule 500 mg  Status:  Discontinued        500 mg Oral Every 8 hours 01/16/23 0652 01/20/23 1017   01/07/23 1930  ceFAZolin (ANCEF) IVPB 2g/100 mL premix         2 g 200 mL/hr over 30 Minutes Intravenous Every 8 hours 01/07/23 1738 01/09/23 1513   01/07/23 0600  ceFAZolin (ANCEF) IVPB 3g/100 mL premix        3 g 200 mL/hr over 30 Minutes Intravenous On call to O.R. 01/06/23 1428 01/07/23 1411   01/02/23 2000  vancomycin (VANCOCIN) IVPB 1000 mg/200 mL premix        1,000 mg 200 mL/hr over 60 Minutes Intravenous  Once 01/02/23 1416 01/02/23 2122   01/02/23 1500  ceFAZolin (ANCEF) IVPB 2g/100 mL premix        2 g 200 mL/hr over 30 Minutes Intravenous Every 8 hours 01/02/23 1416 01/04/23 0721   01/02/23 0400  vancomycin (VANCOREADY) IVPB 1500 mg/300 mL        1,500 mg 150 mL/hr over 120 Minutes Intravenous To Surgery 01/01/23 1018 01/02/23 1003   01/02/23 0400  ceFAZolin (ANCEF) IVPB 2g/100 mL premix        2 g 200 mL/hr over 30 Minutes Intravenous To Surgery 01/01/23 1018 01/02/23 1244   01/02/23 0400  ceFAZolin (ANCEF) IVPB 3g/100 mL premix        3 g 200 mL/hr over 30 Minutes Intravenous To Surgery 01/01/23 1018 01/02/23 0825       Assessment/Plan: s/p Procedure(s): Excision of sternal wound with Myriad APPLICATION OF WOUND VAC  Plan: -Patient doing well postop day 1.  I discussed the case with Dr. Ulice Bold.  We will plan to take the patient back to the operating room next week if he is still admitted to the hospital.  If patient is discharged prior to next week, we will plan to have him follow-up in our clinic for evaluation next week.   -Please have patient call our clinic and make an appointment for next week if he is discharged. -Continue wound VAC 125 mmHg.  Continue binder. -Patient is okay to be discharged from plastic surgery standpoint. -Medical management and pain control per primary -Plastics will continue to follow   LOS: 22 days    Laurena Spies, PA-C 01/24/2023

## 2023-01-24 NOTE — Progress Notes (Signed)
CARDIAC REHAB PHASE I   Pt  preparing for ambulation with mobility team.  Reviewed sternal precautions, oob to chair and  IS use and mobility importance. Encouraged pt to sit in chair when he returns from walk. Pt agreed but states he can only sit for 1-2 hours, before returning to bed. Encouraged pt to try to stay oob as much as possible today. Pt also reports that he needs 2-3 hours rest after walks before attempting another walk. I will return to offer walk later today as time allows. PT/OT are also onboard with mobility assistance.   1040-1050  Woodroe Chen, RN BSN 01/24/2023 11:08 AM

## 2023-01-25 ENCOUNTER — Encounter (HOSPITAL_COMMUNITY): Payer: Self-pay | Admitting: Plastic Surgery

## 2023-01-25 LAB — BASIC METABOLIC PANEL
Anion gap: 8 (ref 5–15)
BUN: 21 mg/dL (ref 8–23)
CO2: 30 mmol/L (ref 22–32)
Calcium: 8.2 mg/dL — ABNORMAL LOW (ref 8.9–10.3)
Chloride: 92 mmol/L — ABNORMAL LOW (ref 98–111)
Creatinine, Ser: 1.11 mg/dL (ref 0.61–1.24)
GFR, Estimated: >60 mL/min (ref >60)
Glucose, Bld: 114 mg/dL — ABNORMAL HIGH (ref 70–99)
Potassium: 3.6 mmol/L (ref 3.5–5.1)
Sodium: 130 mmol/L — ABNORMAL LOW (ref 135–145)

## 2023-01-25 LAB — GLUCOSE, CAPILLARY
Glucose-Capillary: 109 mg/dL — ABNORMAL HIGH (ref 70–99)
Glucose-Capillary: 176 mg/dL — ABNORMAL HIGH (ref 70–99)
Glucose-Capillary: 92 mg/dL (ref 70–99)
Glucose-Capillary: 122 mg/dL — ABNORMAL HIGH (ref 70–99)

## 2023-01-25 LAB — AEROBIC/ANAEROBIC CULTURE W GRAM STAIN (SURGICAL/DEEP WOUND): Culture: NO GROWTH

## 2023-01-25 MED ORDER — AMIODARONE HCL 200 MG PO TABS
200.0000 mg | ORAL_TABLET | Freq: Every day | ORAL | Status: DC
  Administered 2023-01-25 – 2023-02-04 (×11): 200 mg via ORAL
  Filled 2023-01-25 (×11): qty 1

## 2023-01-25 MED ORDER — LACTULOSE 10 GM/15ML PO SOLN
20.0000 g | ORAL | Status: DC | PRN
  Filled 2023-01-25: qty 30

## 2023-01-25 MED ORDER — POTASSIUM CHLORIDE CRYS ER 20 MEQ PO TBCR
40.0000 meq | EXTENDED_RELEASE_TABLET | Freq: Once | ORAL | Status: AC
  Administered 2023-01-25: 40 meq via ORAL
  Filled 2023-01-25: qty 2

## 2023-01-25 NOTE — Progress Notes (Signed)
CARDIAC REHAB PHASE I   PRE:  Rate/Rhythm: 72 SR   BP:  Sitting: 132/80      SaO2: 96 RA  MODE:  Ambulation: 200 ft   POST:  Rate/Rhythm: 90 SR   BP:  Sitting: 143/86      SaO2: 97 RA  Pt agreeable to ambulation in hall. Several minutes spent reeducating pt on proper out of bed techniques. Ambulated in hall moving at a slow steady pace. +2 assist, mostly for handling of lines and equipment. Returned to [facility] for BM with call bell in reach. Reviewed sternal precautions, home needs at discharge and CRP2. Pt has received this educational material on several occasions and claims to not remember most of his education. Pt also states that he was unaware that he would need someone at home with him full time, however I myself have discussed this with him several times.  Pt encouraged to continue oob and ambulation today. In my opinion, pt mobility and independence needs to improve prior to discharge home.    [id number]   Woodroe Chen, RN BSN 01/25/2023 9:22 AM

## 2023-01-25 NOTE — Progress Notes (Addendum)
      [address]       [place] [address]             [phone]      2 Days Post-Op Procedure(s) (LRB): Excision of sternal wound with Myriad (N/A) APPLICATION OF WOUND VAC (N/A)  Subjective:  Patient without new complaints.  Continues to complain of tingling in his feet.  He doesn't think he can go home in his current state.    Objective: Vital signs in last 24 hours: Temp:  [98.9 F (37.2 C)-99 F (37.2 C)] 98.9 F (37.2 C) (05/31 2326) Pulse Rate:  [64-74] 70 (06/01 0313) Cardiac Rhythm: Normal sinus rhythm (05/31 2000) Resp:  [19-21] 19 (06/01 0313) BP: (95-117)/(64-72) 117/64 (06/01 0313) SpO2:  [90 %-98 %] 90 % (06/01 0313) Weight:  [138.7 kg] 138.7 kg (06/01 0500)  Intake/Output from previous day: 05/31 0701 - 06/01 0700 In: 250 [P.O.:250] Out: 650 [Urine:600; Drains:50]  General appearance: alert, cooperative, no distress, and morbidly obese Heart: regular rate and rhythm Lungs: clear to auscultation bilaterally Abdomen: soft, non-tender; bowel sounds normal; no masses,  no organomegaly Extremities: edema trace Wound: wound vac on sternum + serous drainage in cannister, EVH site clean and dry  Lab Results: Recent Labs    01/24/23 0053  WBC 8.4  HGB 8.9*  HCT 28.4*  PLT 376   BMET:  Recent Labs    01/24/23 0053 01/25/23 0112  NA 132* 130*  K 2.8* 3.6  CL 93* 92*  CO2 30 30  GLUCOSE 140* 114*  BUN 20 21  CREATININE 1.23 1.11  CALCIUM 8.3* 8.2*    PT/INR: No results for input(s): "LABPROT", "INR" in the last 72 hours. ABG    Component Value Date/Time   PHART 7.399 01/08/2023 0830   HCO3 28.2 (H) 01/08/2023 0830   TCO2 30 01/08/2023 0830   ACIDBASEDEF 1.0 01/07/2023 1936   O2SAT 97 01/08/2023 0830   CBG (last 3)  Recent Labs    01/24/23 1648 01/24/23 2113 01/25/23 0623  GLUCAP 116* 140* 109*    Assessment/Plan: S/P Procedure(s) (LRB): Excision of sternal wound with Myriad (N/A) APPLICATION OF WOUND VAC  (N/A)  CV- NSR, BP stable-will decrease Amiodarone to 200 mg daily, continue [place] Pulm- no acute issues, off oxygen Renal- creatinine stable, weight is below admission Hypokalemia- resolved, K is at 3.6, will continue to supplement with goal of 4.0 ID- afebrile, OR cultures are negative, wound vac in place on sternum DM- sugars stable, newly diagnosed Activity- patient is deconditioned, mobility is limited due to tingling in his feet, he will not have full time help at home.. he was educated on importance of getting up and ambulating with staff... however, patient would likely benefit from inpatient rehab.. as he is unable to get himself up and down and his wife works full time and will not be home during the day at time of discharge..   LOS: 23 days    Lowella Dandy, PA-C 01/25/2023 Patient seen and examined, agree with above Dc planning  Porschia Willbanks C. Dorris Fetch, MD [facility] [phone]

## 2023-01-25 NOTE — Progress Notes (Addendum)
Mobility Specialist Progress Note   01/25/23 1050  Mobility  Activity Ambulated with assistance in room;Transferred to/from [facility];Transferred from bed to chair  Level of Assistance Minimal assist, patient does 75% or more  Assistive Device Front wheel walker  Distance Ambulated (ft) 20 ft  Range of Motion/Exercises Active;All extremities  Activity Response Tolerated well   Patient received on [facility] requesting assistance. Required min A +2 to stand from [facility] and ambulated in room min to min G with slow gait. Tolerated without complaint or incident. Was left in recliner with all needs met, call bell in reach.   Bryan Brock, BS EXP Mobility Specialist Please contact via SecureChat or Rehab office at [phone]

## 2023-01-25 NOTE — TOC Transition Note (Addendum)
Transition of Care [facility]) - CM/SW Discharge Note   Patient Details  Name: Bryan Brock MRN: [id number] Date of Birth: [birth year]  Transition [facility] Care [facility]) CM/SW Contact:  Lawerance Sabal, RN Phone Number: 01/25/2023, 8:25 AM   Clinical Narrative:     Layla Maw RW to be delivered to the room today before DC. Spoke w Laqueta Due PA, who signed order from [facility], [place] will release VAC to me today once order is processed by the weekend team at [facility].  Once VAC is order is released I will bring home VAC and supplies to the room.  Erin states that [facility] is to stay in place until patient follows up at plastic surgeon. I have notified Alyssa w [facility] who confirmed receipt of those update directions for [facility] wound care.   Brought VAC to patient's room, and he states that the plan now is for him to be evaluated by CIR. Will return VAC to 6N office if he doesn't DC home this weekend.    Barriers to Discharge: Continued Medical Work up   Patient Goals and CMS Choice   Choice offered to / list presented to : Patient  Discharge Placement                         Discharge Plan and Services Additional resources added to the After Visit Summary for   In-house Referral: NA Discharge Planning Services: CM Consult Post Acute Care Choice: Durable Medical Equipment, Home Health          DME Arranged: Walker rolling         HH Arranged: PT, OT HH Agency: [facility]     Representative spoke with at [facility] Agency: Bjorn Loser  Social Determinants of Health (SDOH) Interventions SDOH Screenings   Food Insecurity: No Food Insecurity (01/05/2023)  Housing: Low Risk  (01/05/2023)  Transportation Needs: No Transportation Needs (01/05/2023)  Utilities: Not At Risk (01/05/2023)  Alcohol Screen: Low Risk  (03/05/2022)  Depression (PHQ2-9): Low Risk  (10/31/2022)  Financial Resource Strain: Low Risk  (03/05/2022)  Physical Activity: Inactive (03/05/2022)  Social Connections: Moderately  Integrated (03/05/2022)  Stress: No Stress Concern Present (03/05/2022)  Tobacco Use: High Risk (01/25/2023)     Readmission Risk Interventions     No data to display

## 2023-01-25 NOTE — Progress Notes (Signed)
SATURATION QUALIFICATIONS: (This note is used to comply with regulatory documentation for home oxygen)  Patient Saturations on Room Air at Rest = 96%  Patient Saturations on Room Air while Ambulating = 93%  Patient Saturations on 0 Liters of oxygen while Ambulating = 92-98%  Please briefly explain why patient needs home oxygen: pt did not require supplemental oxygen for ambulation O2 saturations remained 92-98% during ambulation session.    Sofie Rower RN BSN

## 2023-01-26 LAB — GLUCOSE, CAPILLARY
Glucose-Capillary: 118 mg/dL — ABNORMAL HIGH (ref 70–99)
Glucose-Capillary: 131 mg/dL — ABNORMAL HIGH (ref 70–99)
Glucose-Capillary: 101 mg/dL — ABNORMAL HIGH (ref 70–99)
Glucose-Capillary: 132 mg/dL — ABNORMAL HIGH (ref 70–99)

## 2023-01-26 NOTE — Care Management (Signed)
CIR eval ordered 6/1. Message sent to weekend CIR liaison requested eval.  Wound VAC [id number] returned to 6N office.

## 2023-01-26 NOTE — Progress Notes (Addendum)
      [address]       [place] [address]             [phone]       3 Days Post-Op Procedure(s) (LRB): Excision of sternal wound with Myriad (N/A) APPLICATION OF WOUND VAC (N/A)  Subjective:  Patient without new complaints.  He states he had a good day yesterday.  Ambulated twice in hallway.   He is adamant about attending CIR upstairs.  I explained to patient that we will need to see what PT recommends knowing he does not have help at home for the first week.    Objective: Vital signs in last 24 hours: Temp:  [97.8 F (36.6 C)-99 F (37.2 C)] 98.2 F (36.8 C) (06/01 1956) Pulse Rate:  [70-86] 79 (06/01 1956) Cardiac Rhythm: Normal sinus rhythm (06/01 1930) Resp:  [15-20] 15 (06/01 1956) BP: (106-143)/(58-86) 129/66 (06/01 1956) SpO2:  [90 %-100 %] 96 % (06/01 2005) Weight:  [145 kg] 145 kg (06/02 0500)  Intake/Output from previous day: 06/01 0701 - 06/02 0700 In: 720 [P.O.:720] Out: 2400 [Urine:1925; Drains:475] Intake/Output this shift: Total I/O In: -  Out: 450 [Urine:450]  General appearance: alert, cooperative, no distress, and morbidly obese Heart: regular rate and rhythm Lungs: clear to auscultation bilaterally Abdomen: soft, non-tender; bowel sounds normal; no masses,  no organomegaly Extremities: edema trace, improving Wound: clean and dry EVH site, wound vac in place with + serous drainage  Lab Results: Recent Labs    01/24/23 0053  WBC 8.4  HGB 8.9*  HCT 28.4*  PLT 376   BMET:  Recent Labs    01/24/23 0053 01/25/23 0112  NA 132* 130*  K 2.8* 3.6  CL 93* 92*  CO2 30 30  GLUCOSE 140* 114*  BUN 20 21  CREATININE 1.23 1.11  CALCIUM 8.3* 8.2*    PT/INR: No results for input(s): "LABPROT", "INR" in the last 72 hours. ABG    Component Value Date/Time   PHART 7.399 01/08/2023 0830   HCO3 28.2 (H) 01/08/2023 0830   TCO2 30 01/08/2023 0830   ACIDBASEDEF 1.0 01/07/2023 1936   O2SAT 97 01/08/2023 0830   CBG (last 3)   Recent Labs    01/25/23 1616 01/25/23 2143 01/26/23 0632  GLUCAP 92 122* 118*    Assessment/Plan: S/P Procedure(s) (LRB): Excision of sternal wound with Myriad (N/A) APPLICATION OF WOUND VAC (N/A)  CV- NSR, BP stable, continue Amiodarone, Lopressor Pulm- no acute issues Renal- creatinine had been stable, weight is below admission, recheck BMET in AM ID- afebrile, wound vac on sternum.. continue Keflex.. OR cultures negative DM- sugars stable Deconditioning- patient will be at home alone all day upon discharge.  He is unable to get out of bed without assistance.  I think with his body habitus and wound vac he would benefit from a stay in CIR to increase his independence level prior to discharge home as he will not be attended to all day.Marland Kitchen await further PT recs   LOS: 24 days    Lowella Dandy, PA-C 01/26/2023  Patient seen and examined, agree with above Dc planning  Viviann Spare C. Dorris Fetch, MD [facility] [phone]

## 2023-01-26 NOTE — Progress Notes (Signed)
Wound VAC got occluded. Cardiothoracic PA on call notified. order received for stopping the wound VAC and putting new dressing with KY jelly, 4x4 gauze, and ABD pad from PA. Will continue to monitor the pt.   Lawson Radar, RN

## 2023-01-26 NOTE — Progress Notes (Signed)
Mobility Specialist Progress Note   01/26/23 1457  Mobility  Activity Refused mobility   Patient declined stating he wants to wait for physical therapist. Despite educating patient on writers role and encouragement to participate, patient respectfully declined. Will f/u as time permits.  [place] Sakib Noguez, BS EXP Mobility Specialist Please contact via SecureChat or Rehab office at [phone]

## 2023-01-27 ENCOUNTER — Encounter (HOSPITAL_COMMUNITY): Payer: Self-pay | Admitting: Plastic Surgery

## 2023-01-27 LAB — BASIC METABOLIC PANEL
Anion gap: 12 (ref 5–15)
BUN: 21 mg/dL (ref 8–23)
CO2: 30 mmol/L (ref 22–32)
Calcium: 8.5 mg/dL — ABNORMAL LOW (ref 8.9–10.3)
Chloride: 91 mmol/L — ABNORMAL LOW (ref 98–111)
Creatinine, Ser: 1.11 mg/dL (ref 0.61–1.24)
GFR, Estimated: >60 mL/min (ref >60)
Glucose, Bld: 107 mg/dL — ABNORMAL HIGH (ref 70–99)
Potassium: 3.5 mmol/L (ref 3.5–5.1)
Sodium: 133 mmol/L — ABNORMAL LOW (ref 135–145)

## 2023-01-27 LAB — GLUCOSE, CAPILLARY
Glucose-Capillary: 103 mg/dL — ABNORMAL HIGH (ref 70–99)
Glucose-Capillary: 121 mg/dL — ABNORMAL HIGH (ref 70–99)
Glucose-Capillary: 92 mg/dL (ref 70–99)
Glucose-Capillary: 86 mg/dL (ref 70–99)

## 2023-01-27 MED ORDER — POTASSIUM CHLORIDE CRYS ER 20 MEQ PO TBCR
20.0000 meq | EXTENDED_RELEASE_TABLET | Freq: Two times a day (BID) | ORAL | Status: DC
  Administered 2023-01-27 – 2023-01-29 (×6): 20 meq via ORAL
  Filled 2023-01-27 (×6): qty 1

## 2023-01-27 NOTE — Progress Notes (Addendum)
CARDIAC REHAB PHASE I     Pt resting in bed, feeling well today. Agreeable to walk in hall. Pt demonstrated excellent sternal precautions with getting to side of bed, from lying position. Pt required guard assist for safety . Reports he can get to left side better compared to right side of bed. PT arrived to room for evaluation. Will return later today, as time allows, to offer walk in hall. Encouraged three walks per day, IS use, oob to chair and keeping lights on during daytime. Will continue to follow.   [id number]    Woodroe Chen, RN BSN 01/27/2023 10:10 AM

## 2023-01-27 NOTE — Progress Notes (Signed)
4 Days Post-Op  Subjective: Patient is a 67 year old male with history of a sternal wound.  He underwent excision of sternal wound and soft tissue, intermediate closure of the wound and placement of myriad and powder as well as wound VAC with Dr. Ulice Bold on 01/23/2023.  He is for 4 days postop.  Upon entering the room, patient is sitting comfortably in his chair eating his breakfast.  Patient reports he is doing well.  Over the weekend, his wound VAC stopped working.  He states that he has had some superficial dressing changes.  He denies any other issues or concerns.  He reports he is feeling well and feels stronger.  Objective: Vital signs in last 24 hours: Temp:  [98 F (36.7 C)-98.4 F (36.9 C)] 98.2 F (36.8 C) (06/03 0810) Pulse Rate:  [66-81] 75 (06/03 0810) Resp:  [15-22] 18 (06/03 0810) BP: (110-131)/(53-71) 114/58 (06/03 0810) SpO2:  [94 %-100 %] 94 % (06/03 0810) Last BM Date : 01/26/23  Intake/Output from previous day: 06/02 0701 - 06/03 0700 In: -  Out: 5875 [Urine:5850; Drains:25] Intake/Output this shift: Total I/O In: -  Out: 450 [Urine:450]  General appearance: alert, cooperative, and no distress Resp: Unlabored breathing, no respiratory distress Chest wall: Dressings were removed from sternal wound.  Sorbact and white sponges still intact.  There is no surrounding erythema.  There is no active drainage on exam.  There are no signs of infection on exam.  New black wound VAC sponge was placed over the Sorbact and white sponge.  Drapes were applied and wound VAC machine was turned to 125 mmHg with adequate suction.  No leaks noted on exam after machine was turned down.  Lab Results:     Latest Ref Rng & Units 01/24/2023   12:53 AM 01/22/2023    2:47 AM 01/21/2023    1:01 AM  CBC  WBC 4.0 - 10.5 K/uL 8.4  9.6  9.5   Hemoglobin 13.0 - 17.0 g/dL 8.9  9.6  9.1   Hematocrit 39.0 - 52.0 % 28.4  30.7  29.1   Platelets 150 - 400 K/uL 376  470  496     BMET Recent  Labs    01/25/23 0112 01/27/23 0200  NA 130* 133*  K 3.6 3.5  CL 92* 91*  CO2 30 30  GLUCOSE 114* 107*  BUN 21 21  CREATININE 1.11 1.11  CALCIUM 8.2* 8.5*   PT/INR No results for input(s): "LABPROT", "INR" in the last 72 hours. ABG No results for input(s): "PHART", "HCO3" in the last 72 hours.  Invalid input(s): "PCO2", "PO2"  Studies/Results: No results found.  Anti-infectives: Anti-infectives (From admission, onward)    Start     Dose/Rate Route Frequency Ordered Stop   01/24/23 0800  vancomycin (VANCOREADY) IVPB 1500 mg/300 mL  Status:  Discontinued        1,500 mg 150 mL/hr over 120 Minutes Intravenous Every 24 hours 01/23/23 1303 01/24/23 0750   01/24/23 0800  cephALEXin (KEFLEX) capsule 500 mg        500 mg Oral 3 times daily before meals & bedtime 01/24/23 0750 01/30/23 2159   01/23/23 0600  ceFAZolin (ANCEF) IVPB 3g/100 mL premix        3 g 200 mL/hr over 30 Minutes Intravenous On call to O.R. 01/22/23 2011 01/23/23 1506   01/20/23 1400  ceFEPIme (MAXIPIME) 2 g in sodium chloride 0.9 % 100 mL IVPB  Status:  Discontinued  2 g 200 mL/hr over 30 Minutes Intravenous Every 8 hours 01/20/23 1017 01/24/23 0750   01/20/23 0600  vancomycin (VANCOREADY) IVPB 1500 mg/300 mL  Status:  Discontinued        1,500 mg 150 mL/hr over 120 Minutes Intravenous On call to O.R. 01/19/23 1849 01/19/23 1917   01/19/23 2015  vancomycin (VANCOREADY) IVPB 1500 mg/300 mL  Status:  Discontinued        1,500 mg 150 mL/hr over 120 Minutes Intravenous Every 24 hours 01/19/23 1917 01/23/23 1303   01/16/23 0745  cephALEXin (KEFLEX) capsule 500 mg  Status:  Discontinued        500 mg Oral Every 8 hours 01/16/23 0652 01/20/23 1017   01/07/23 1930  ceFAZolin (ANCEF) IVPB 2g/100 mL premix        2 g 200 mL/hr over 30 Minutes Intravenous Every 8 hours 01/07/23 1738 01/09/23 1513   01/07/23 0600  ceFAZolin (ANCEF) IVPB 3g/100 mL premix        3 g 200 mL/hr over 30 Minutes Intravenous On call  to O.R. 01/06/23 1428 01/07/23 1411   01/02/23 2000  vancomycin (VANCOCIN) IVPB 1000 mg/200 mL premix        1,000 mg 200 mL/hr over 60 Minutes Intravenous  Once 01/02/23 1416 01/02/23 2122   01/02/23 1500  ceFAZolin (ANCEF) IVPB 2g/100 mL premix        2 g 200 mL/hr over 30 Minutes Intravenous Every 8 hours 01/02/23 1416 01/04/23 0721   01/02/23 0400  vancomycin (VANCOREADY) IVPB 1500 mg/300 mL        1,500 mg 150 mL/hr over 120 Minutes Intravenous To Surgery 01/01/23 1018 01/02/23 1003   01/02/23 0400  ceFAZolin (ANCEF) IVPB 2g/100 mL premix        2 g 200 mL/hr over 30 Minutes Intravenous To Surgery 01/01/23 1018 01/02/23 1244   01/02/23 0400  ceFAZolin (ANCEF) IVPB 3g/100 mL premix        3 g 200 mL/hr over 30 Minutes Intravenous To Surgery 01/01/23 1018 01/02/23 0825       Assessment/Plan: s/p Procedure(s): Excision of sternal wound with Myriad APPLICATION OF WOUND VAC Plan: -Wound VAC functioning properly after change today.  If leak occurs, edges of the wound VAC drapes may be reinforced with more wound VAC drapes. -Patient to return to the OR this week -Continue wound VAC at 125 mmHg, continue binder -Medical management and pain control per primary -Plastics will continue to follow    LOS: 25 days    Laurena Spies, PA-C 01/27/2023

## 2023-01-27 NOTE — Progress Notes (Signed)
Physical Therapy Treatment Patient Details Name: Bryan Brock MRN: [id number] DOB: [birth year] Today's Date: 01/27/2023   History of Present Illness 67 yo male admitted 5/9 for CABG x 4. 5/14 sternal rewiring due to dehiscence coughing. 5/27 sternal wound I&D with VAC placement. PMhx: HTN, HLD, bipolar d/o, obesity, sleep apnea, mitral valve prolapse, GERD.    PT Comments    Pt is progressing toward his goals, and is very motivated to return to his prior independence, however pt continues to be limited in safe mobility by R knee pain and generalized weakness from prolonged hospitalization. Pt has 5 steps to enter home and requested to work on stair climbing as he had in prior PT session. In attempt to conserve energy pt was wheeled to stairwell in his recliner. Once there worked on climbing steps backwards with use of Bariatric walker. Pt with decreased ability to ascend steps without increased UE pushing. Also, Bariatric walker is not compatible to this method of stair ascent/descent. Discussed need for post acute rehab, and possibility of pt having rails installed for safer stair navigation.  Patient will benefit from intensive inpatient follow up therapy, >3 hours/day to return to PLOF and safely navigate his home environment.     Recommendations for follow up therapy are one component of a multi-disciplinary discharge planning process, led by the attending physician.  Recommendations may be updated based on patient status, additional functional criteria and insurance authorization.     Assistance Recommended at Discharge Intermittent Supervision/Assistance  Patient can return home with the following A little help with walking and/or transfers;A little help with bathing/dressing/bathroom;Assistance with cooking/housework;Assist for transportation;Help with stairs or ramp for entrance   Equipment Recommendations  Rolling walker (2 wheels);Other (comment) (Bariatric)       Precautions /  Restrictions Precautions Precautions: Sternal;Fall;Other (comment) Precaution Booklet Issued: No Precaution Comments: Watch SpO2, VAC Restrictions Weight Bearing Restrictions: Yes RUE Weight Bearing: Non weight bearing LUE Weight Bearing: Non weight bearing Other Position/Activity Restrictions: sternal     Mobility  Bed Mobility               General bed mobility comments: sitting EoB with cardiac rehab on entry    Transfers Overall transfer level: Needs assistance Equipment used: Rolling walker (2 wheels) Transfers: Sit to/from Stand Sit to Stand: Min guard   Step pivot transfers: Min assist       General transfer comment: min guard for safety, vc for use of sternal pillow during power up to refrain pushing off with UE, light min A for steadying with stepping to recliner    Ambulation/Gait Ambulation/Gait assistance: Min guard Gait Distance (Feet): 70 Feet Assistive device: Rolling walker (2 wheels) Gait Pattern/deviations: Step-through pattern, Decreased stride length Gait velocity: WFL Gait velocity interpretation: 1.31 - 2.62 ft/sec, indicative of limited community ambulator   General Gait Details: cues for upright posture,   Stairs Stairs: Yes Stairs assistance: Min assist Stair Management: Backwards, With walker Number of Stairs: 1 (x3) General stair comments: backwards stepping up step, pt with decreased ability to refrain from increased pushing with UE,vc for decreased UE use, limited by R knee pain, sequenced to maximize L LE strength, pt ultimately unable to progress to next step up with RW without increased force through UE, discussed that having rails put on his steps would be the safest option. Pt in agreement       Balance Overall balance assessment: Needs assistance Sitting-balance support: No upper extremity supported, Feet supported Sitting balance-Leahy Scale: Good Sitting  balance - Comments: EOB without support   Standing balance  support: Bilateral upper extremity supported, During functional activity Standing balance-Leahy Scale: Fair Standing balance comment: can stand without UB support, RW for gait                            Cognition Arousal/Alertness: Awake/alert Behavior During Therapy: WFL for tasks assessed/performed, Anxious Overall Cognitive Status: Within Functional Limits for tasks assessed                                 General Comments: aware of precautions and situation           General Comments General comments (skin integrity, edema, etc.): VSS on RA      Pertinent Vitals/Pain Pain Assessment Pain Assessment: Faces Faces Pain Scale: Hurts even more Pain Location: L foot (forefoot/dorsal surface); sternal incision, BLE feet Pain Descriptors / Indicators: Discomfort, Grimacing, Tingling Pain Intervention(s): Limited activity within patient's tolerance, Monitored during session, Repositioned     PT Goals (current goals can now be found in the care plan section) Acute Rehab PT Goals PT Goal Formulation: With patient Time For Goal Achievement: 02/07/23 Potential to Achieve Goals: Good Progress towards PT goals: Progressing toward goals    Frequency    Min 1X/week      PT Plan Discharge plan needs to be updated       AM-PAC PT "6 Clicks" Mobility   Outcome Measure  Help needed turning from your back to your side while in a flat bed without using bedrails?: A Little Help needed moving from lying on your back to sitting on the side of a flat bed without using bedrails?: A Little Help needed moving to and from a bed to a chair (including a wheelchair)?: A Little Help needed standing up from a chair using your arms (e.g., wheelchair or bedside chair)?: A Little Help needed to walk in hospital room?: A Little Help needed climbing 3-5 steps with a railing? : A Little 6 Click Score: 18    End of Session Equipment Utilized During Treatment: Gait  belt Activity Tolerance: Patient tolerated treatment well Patient left: with call bell/phone within reach;in chair;with chair alarm set Nurse Communication: Mobility status PT Visit Diagnosis: Other abnormalities of gait and mobility (R26.89);Difficulty in walking, not elsewhere classified (R26.2)     Time: 1914-7829 PT Time Calculation (min) (ACUTE ONLY): 43 min  Charges:  $Gait Training: 23-37 mins $Therapeutic Activity: 8-22 mins                     Bettie Swavely B. Beverely Risen PT, DPT Acute Rehabilitation Services Please use secure chat or  Call Office [phone]    [name] [facility] 01/27/2023, 4:41 PM

## 2023-01-27 NOTE — Progress Notes (Signed)
Inpatient Rehab Admissions Coordinator:    I met with Pt. To discuss potential CIR admit. He is potentially interested. He is currently high level, walking and transferring with min guard assist. At this time, he does meet criteria for CIR; however he is not medically ready for CIR at this time, as he has a procedure Wednesday and will potentially further intervention after that.  Pt. States that his wife works and he needs to go home at independent level and be able to manage 5 stairs. I think it is likely that Pt. Will progress to not needing CIR by the time he's medically stable for d/c but I will follow and see how he progressed. I spoke with PA on attending service and there are concerns that Pt. Experiences setbacks post-op, so I will follow and see how he does after his procedure.   Megan Salon, MS, CCC-SLP Rehab Admissions Coordinator  [phone] (celll) [phone] (office)

## 2023-01-27 NOTE — Progress Notes (Addendum)
[address]       [place] [address]             [phone]      4 Days Post-Op Procedure(s) (LRB): Excision of sternal wound with Myriad (N/A) APPLICATION OF WOUND VAC (N/A) Subjective: Pt states he hears some sloshing since the wound VAC hasn't been working but no new complaints.   Objective: Vital signs in last 24 hours: Temp:  [98 F (36.7 C)-98.4 F (36.9 C)] 98.4 F (36.9 C) (06/03 0419) Pulse Rate:  [66-81] 71 (06/03 0419) Cardiac Rhythm: Sinus bradycardia;Other (Comment) (06/03 0406) Resp:  [15-22] 15 (06/03 0419) BP: (110-131)/(53-71) 122/71 (06/03 0419) SpO2:  [91 %-100 %] 100 % (06/03 0419)  Hemodynamic parameters for last 24 hours:    Intake/Output from previous day: 06/02 0701 - 06/03 0700 In: -  Out: 4575 [Urine:4550; Drains:25] Intake/Output this shift: No intake/output data recorded.  General appearance: alert, cooperative, and no distress Neurologic: intact Heart: regular rate and rhythm, S1, S2 normal, no murmur, click, rub or gallop Lungs: clear to auscultation bilaterally Abdomen: soft, non-tender; bowel sounds normal; no masses,  no organomegaly Extremities: edema 2+, right TED hose in place Wound: Saturated dressing in place with serous drainage, removed black sponge and changed dressing, no sign of infection. EVH sites are clean and dry.   Lab Results: No results for input(s): "WBC", "HGB", "HCT", "PLT" in the last 72 hours. BMET:  Recent Labs    01/25/23 0112 01/27/23 0200  NA 130* 133*  K 3.6 3.5  CL 92* 91*  CO2 30 30  GLUCOSE 114* 107*  BUN 21 21  CREATININE 1.11 1.11  CALCIUM 8.2* 8.5*    PT/INR: No results for input(s): "LABPROT", "INR" in the last 72 hours. ABG    Component Value Date/Time   PHART 7.399 01/08/2023 0830   HCO3 28.2 (H) 01/08/2023 0830   TCO2 30 01/08/2023 0830   ACIDBASEDEF 1.0 01/07/2023 1936   O2SAT 97 01/08/2023 0830   CBG (last 3)  Recent Labs    01/26/23 1648  01/26/23 2136 01/27/23 0621  GLUCAP 101* 132* 103*    Assessment/Plan: S/P Procedure(s) (LRB): Excision of sternal wound with Myriad (N/A) APPLICATION OF WOUND VAC (N/A)  Neuro: Pt with decreased ambulation due to pain and tingling in feet, likely neuropathy   CV: NSR, HR 70s. SBP 122. Hx of afib. Continue Amiodarone 200mg  daily and Lopressor 37.5mg  BID.   Pulm: Saturating 100% on RA. CXR showed stable trace right apical pneumothorax. Encourage IS, flutter valve and ambulation.   GI: Good appetite, no nausea. +BM 06/02.    Endo: CBGs controlled on SSI. New diabetes diagnosis A1C 6.6, outpatient appt with PCP has been arranged.   Renal: Cr 1.11. UO 4550cc/24hrs.  Hypokalemia: Resolved, K 3.5, continue supplementation   ID: Leukocytosis resolved, afebrile. On Empiric Keflex.   Hyponatremia: Mild and improved, Na 133. Will monitor.   Sternal dehiscence: Wound VAC occluded, dressing saturated this AM with serous drainage. Removed black sponge and filled with KY jelly, placed 4x4's and abdominal sponges. Plastic surgery to see today for wound vac change, may take to back to OR this week per last plastic surgery note.   Deconditioning: Pt more deconditioned, not ambulating much due to foot pain. Will not have full time help at home as significant other will be working full time. Pending CIR evaluation, and new PT evaluation.    Dispo: Plastic surgery to see patient today for  wound VAC change, CIR eval pending.    LOS: 25 days    Jenny Reichmann, PA-C 01/27/2023   Chart reviewed, patient examined, agree with above. Wound VAC seal lost yesterday and temporary dressing applied. PRS replaced the dressing today and is planning return to OR this week for dressing change and debridement as needed. Discharge planning complicated because he does not have anyone at home during the day since his significant other has to work.

## 2023-01-27 NOTE — Progress Notes (Signed)
CARDIAC REHAB PHASE I   PRE:  Rate/Rhythm: 74 SR    BP: sitting 111/66    SpO2: 93 RA  MODE:  Ambulation: 400 ft   POST:  Rate/Rhythm: 86 SR    BP: sitting 142/57     SpO2: 95 RA  Pt is recliner, eager to ambulate. Rocked to stand with min assist. Ambulated hall with RW. C/o SOB with talking and took x2 short rest stops. Overall tolerated well. To bed, mod assist to lift legs. He knew correct mechanics for getting in bed. VSS.  [id number]   Ethelda Chick BS, ACSM-CEP 01/27/2023 2:38 PM

## 2023-01-28 ENCOUNTER — Telehealth: Payer: Self-pay

## 2023-01-28 LAB — BASIC METABOLIC PANEL
Anion gap: 9 (ref 5–15)
BUN: 20 mg/dL (ref 8–23)
CO2: 30 mmol/L (ref 22–32)
Calcium: 8.4 mg/dL — ABNORMAL LOW (ref 8.9–10.3)
Chloride: 95 mmol/L — ABNORMAL LOW (ref 98–111)
Creatinine, Ser: 1.16 mg/dL (ref 0.61–1.24)
GFR, Estimated: >60 mL/min (ref >60)
Glucose, Bld: 111 mg/dL — ABNORMAL HIGH (ref 70–99)
Potassium: 4 mmol/L (ref 3.5–5.1)
Sodium: 134 mmol/L — ABNORMAL LOW (ref 135–145)

## 2023-01-28 LAB — GLUCOSE, CAPILLARY
Glucose-Capillary: 125 mg/dL — ABNORMAL HIGH (ref 70–99)
Glucose-Capillary: 103 mg/dL — ABNORMAL HIGH (ref 70–99)
Glucose-Capillary: 78 mg/dL (ref 70–99)

## 2023-01-28 MED ORDER — GUAIFENESIN-DM 100-10 MG/5ML PO SYRP
5.0000 mL | ORAL_SOLUTION | ORAL | Status: DC | PRN
  Administered 2023-01-29 (×2): 5 mL via ORAL
  Filled 2023-01-28 (×2): qty 5

## 2023-01-28 MED ORDER — CHLORHEXIDINE GLUCONATE CLOTH 2 % EX PADS
6.0000 | MEDICATED_PAD | Freq: Once | CUTANEOUS | Status: AC
  Administered 2023-01-29: 6 via TOPICAL

## 2023-01-28 MED ORDER — FUROSEMIDE 40 MG PO TABS
40.0000 mg | ORAL_TABLET | Freq: Once | ORAL | Status: AC
  Administered 2023-01-28: 40 mg via ORAL
  Filled 2023-01-28: qty 1

## 2023-01-28 MED ORDER — CEFAZOLIN IN SODIUM CHLORIDE 3-0.9 GM/100ML-% IV SOLN
3.0000 g | INTRAVENOUS | Status: AC
  Administered 2023-01-29: 3 g via INTRAVENOUS

## 2023-01-28 NOTE — Progress Notes (Signed)
CARDIAC REHAB PHASE I   PRE:  Rate/Rhythm: 77 SR       SaO2: 94 RA   MODE:  Ambulation: 400 ft   POST:  Rate/Rhythm: 81 SR      SaO2: 96 RA   Pt agreeable for walk in hall. Pt with  min assist to stand from lying in bed, utilizing proper sternal precautions. Ambulated in hall, using front wheel walker, moving at slow steady pace. Tolerated well with no dizziness and mild SOB at times. Pt took a few short standing breaks. Returned to chair with call bell and bedside table in reach. Will continue to follow.   [id number]    Woodroe Chen, RN BSN 01/28/2023 10:46 AM

## 2023-01-28 NOTE — Progress Notes (Signed)
Occupational Therapy Treatment Patient Details Name: Bryan Brock MRN: [id number] DOB: May 29, 1956 Today's Date: 01/28/2023   History of present illness 67 yo male admitted 5/9 for CABG x 4. 5/14 sternal rewiring due to dehiscence coughing. 5/27 sternal wound I&D with VAC placement. PMhx: HTN, HLD, bipolar d/o, obesity, sleep apnea, mitral valve prolapse, GERD.   OT comments  Pt making good progress with functional goals. Pt reports SOB after sit - stand/SPTs and required rest breaks for task continuation. Pt also hyperverbose and required redirection multiple times to initiate/continue tasks. Pt stood from chair adhering to sternal precautions using pillow, walked around bedside to [facility], clothing mgt mod A, min guard A to sit on BSC, min A to stand from [facility]. Pt on RA upon arrival with O2 SATs 96%, dropping to 90-91% with in room activity. Pt reports that he feels much better since being in the hospital. OT will continue to follow acutely to maximize level of function and safety   Recommendations for follow up therapy are one component of a multi-disciplinary discharge planning process, led by the attending physician.  Recommendations may be updated based on patient status, additional functional criteria and insurance authorization.    Assistance Recommended at Discharge Intermittent Supervision/Assistance  Patient can return home with the following  Assist for transportation;Assistance with cooking/housework;A lot of help with bathing/dressing/bathroom;A little help with walking and/or transfers   Equipment Recommendations   (TBD at next venue of care)    Recommendations for Other Services      Precautions / Restrictions Precautions Precautions: Sternal;Fall;Other (comment) Precaution Comments: Watch SpO2 Restrictions Weight Bearing Restrictions: Yes RUE Weight Bearing: Non weight bearing LUE Weight Bearing: Non weight bearing Other Position/Activity Restrictions: sternal        Mobility Bed Mobility Overal bed mobility: Needs Assistance             General bed mobility comments: pt in chair upon arrival    Transfers Overall transfer level: Needs assistance Equipment used: Rolling walker (2 wheels)   Sit to Stand: Min guard     Step pivot transfers: Min assist     General transfer comment: min guard A for safety,  light min A for steadying stepping to BSC, bed and back to recliner     Balance Overall balance assessment: Needs assistance Sitting-balance support: No upper extremity supported, Feet supported Sitting balance-Leahy Scale: Good     Standing balance support: Bilateral upper extremity supported, During functional activity Standing balance-Leahy Scale: Fair                             ADL either performed or assessed with clinical judgement   ADL Overall ADL's : Needs assistance/impaired     Grooming: Wash/dry hands;Wash/dry face;Min guard;Standing           Upper Body Dressing : Minimal assistance;Sitting   Lower Body Dressing: Moderate assistance Lower Body Dressing Details (indicate cue type and reason): AE training with reacher, sock aide, LH shoehorn, LH sponge, dressing stick, and sock aide Toilet Transfer: Minimal assistance;Min guard;Rolling walker (2 wheels);BSC/3in1   Toileting- Clothing Manipulation and Hygiene: Moderate assistance Toileting - Clothing Manipulation Details (indicate cue type and reason): clothing mgt     Functional mobility during ADLs: Minimal assistance;Min guard;Rolling walker (2 wheels) General ADL Comments: reviewed A/E use for LB ADLs    Extremity/Trunk Assessment Upper Extremity Assessment Upper Extremity Assessment: Overall WFL for tasks assessed   Lower Extremity Assessment Lower  Extremity Assessment: Defer to PT evaluation        Vision Baseline Vision/History: 1 Wears glasses Ability to See in Adequate Light: 0 Adequate Patient Visual Report: No change from  baseline     Perception     Praxis      Cognition Arousal/Alertness: Awake/alert Behavior During Therapy: WFL for tasks assessed/performed Overall Cognitive Status: Within Functional Limits for tasks assessed                                 General Comments: hyperverbose, requires redirection for task continuation        Exercises      Shoulder Instructions       General Comments      Pertinent Vitals/ Pain       Pain Assessment Pain Assessment: Faces Faces Pain Scale: Hurts little more Pain Location: L foot (forefoot/dorsal surface); sternal incision, BLE feet Pain Descriptors / Indicators: Discomfort, Grimacing, Tingling Pain Intervention(s): Monitored during session, Repositioned  Home Living                                          Prior Functioning/Environment              Frequency  Min 1X/week        Progress Toward Goals  OT Goals(current goals can now be found in the care plan section)  Progress towards OT goals: Progressing toward goals     Plan Discharge plan remains appropriate    Co-evaluation                 AM-PAC OT "6 Clicks" Daily Activity     Outcome Measure   Help from another person eating meals?: None Help from another person taking care of personal grooming?: A Little Help from another person toileting, which includes using toliet, bedpan, or urinal?: A Lot Help from another person bathing (including washing, rinsing, drying)?: A Lot Help from another person to put on and taking off regular upper body clothing?: A Little Help from another person to put on and taking off regular lower body clothing?: A Lot 6 Click Score: 16    End of Session Equipment Utilized During Treatment: Rolling walker (2 wheels)  OT Visit Diagnosis: Unsteadiness on feet (R26.81);Muscle weakness (generalized) (M62.81)   Activity Tolerance Patient limited by fatigue   Patient Left with call bell/phone  within reach;in chair   Nurse Communication          Time: 1308-6578 OT Time Calculation (min): 26 min  Charges: OT General Charges $OT Visit: 1 Visit OT Treatments $Self Care/Home Management : 8-22 mins $Therapeutic Activity: 8-22 mins    Galen Manila 01/28/2023, 1:36 PM

## 2023-01-28 NOTE — Progress Notes (Signed)
5 Days Post-Op  Subjective: Patient is a 67-year-old male with history of a sternal wound. He underwent excision of sternal wound and soft tissue, intermediate closure of the wound and placement of myriad and powder as well as wound VAC with Dr. Dillingham on 01/23/2023.  He is 5 days postop.  Patient was seen yesterday.  Wound VAC was changed as wound VAC was not functioning over the weekend.  Patient tolerated wound VAC change well.  Overnight, wound VAC was alerting with blockages.  Per CT surgery PA, wound VAC was holding suction anywhere between 10 mmHg to 100 mmHg.  She requested advice for the malfunctioning wound VAC.  Upon entering the room, patient was sitting upright in no acute distress.  He had just finished with physical therapy.  He reports he is doing well.  He reports he heard some sloshing earlier today, but it has improved.  Objective: Vital signs in last 24 hours: Temp:  [97.8 F (36.6 C)-98.8 F (37.1 C)] 98.6 F (37 C) (06/04 1100) Pulse Rate:  [74-93] 76 (06/04 1100) Resp:  [14-20] 14 (06/04 1100) BP: (101-144)/(49-125) 144/125 (06/04 1100) SpO2:  [70 %-100 %] 93 % (06/04 1100) Last BM Date : 01/26/23  Intake/Output from previous day: 06/03 0701 - 06/04 0700 In: -  Out: 925 [Urine:850; Drains:75] Intake/Output this shift: No intake/output data recorded.  General appearance: alert, cooperative, and no distress Resp: Unlabored breathing, no respiratory distress Chest wall: Wound VAC noted with new canister was at 50 mmHg.  There was a whistling sound noted at the wound VAC site.  There appears to be some drainage underneath the wound VAC drains.  Wound VAC sponges and drapes were removed.  There was some serous appearing drainage noted on the skin.  There is no surrounding erythema on the skin.  There is no malodor.  Sorbact was left in place.  New white wound VAC sponge and new black wound VAC sponge were applied over the Sorbact along with wound VAC drapes.  New  canister was placed on the wound VAC machine as well.  Suction was set to 125 mmHg.  Adequate seal was noted.  No signs of leakage after change.  Lab Results:     Latest Ref Rng & Units 01/24/2023   12:53 AM 01/22/2023    2:47 AM 01/21/2023    1:01 AM  CBC  WBC 4.0 - 10.5 K/uL 8.4  9.6  9.5   Hemoglobin 13.0 - 17.0 g/dL 8.9  9.6  9.1   Hematocrit 39.0 - 52.0 % 28.4  30.7  29.1   Platelets 150 - 400 K/uL 376  470  496     BMET Recent Labs    01/27/23 0200 01/28/23 0243  NA 133* 134*  K 3.5 4.0  CL 91* 95*  CO2 30 30  GLUCOSE 107* 111*  BUN 21 20  CREATININE 1.11 1.16  CALCIUM 8.5* 8.4*   PT/INR No results for input(s): "LABPROT", "INR" in the last 72 hours. ABG No results for input(s): "PHART", "HCO3" in the last 72 hours.  Invalid input(s): "PCO2", "PO2"  Studies/Results: No results found.  Anti-infectives: Anti-infectives (From admission, onward)    Start     Dose/Rate Route Frequency Ordered Stop   01/24/23 0800  vancomycin (VANCOREADY) IVPB 1500 mg/300 mL  Status:  Discontinued        1,500 mg 150 mL/hr over 120 Minutes Intravenous Every 24 hours 01/23/23 1303 01/24/23 0750   01/24/23 0800  cephALEXin (KEFLEX) capsule 500   mg        500 mg Oral 3 times daily before meals & bedtime 01/24/23 0750 01/30/23 2159   01/23/23 0600  ceFAZolin (ANCEF) IVPB 3g/100 mL premix        3 g 200 mL/hr over 30 Minutes Intravenous On call to O.R. 01/22/23 2011 01/23/23 1506   01/20/23 1400  ceFEPIme (MAXIPIME) 2 g in sodium chloride 0.9 % 100 mL IVPB  Status:  Discontinued        2 g 200 mL/hr over 30 Minutes Intravenous Every 8 hours 01/20/23 1017 01/24/23 0750   01/20/23 0600  vancomycin (VANCOREADY) IVPB 1500 mg/300 mL  Status:  Discontinued        1,500 mg 150 mL/hr over 120 Minutes Intravenous On call to O.R. 01/19/23 1849 01/19/23 1917   01/19/23 2015  vancomycin (VANCOREADY) IVPB 1500 mg/300 mL  Status:  Discontinued        1,500 mg 150 mL/hr over 120 Minutes Intravenous  Every 24 hours 01/19/23 1917 01/23/23 1303   01/16/23 0745  cephALEXin (KEFLEX) capsule 500 mg  Status:  Discontinued        500 mg Oral Every 8 hours 01/16/23 0652 01/20/23 1017   01/07/23 1930  ceFAZolin (ANCEF) IVPB 2g/100 mL premix        2 g 200 mL/hr over 30 Minutes Intravenous Every 8 hours 01/07/23 1738 01/09/23 1513   01/07/23 0600  ceFAZolin (ANCEF) IVPB 3g/100 mL premix        3 g 200 mL/hr over 30 Minutes Intravenous On call to O.R. 01/06/23 1428 01/07/23 1411   01/02/23 2000  vancomycin (VANCOCIN) IVPB 1000 mg/200 mL premix        1,000 mg 200 mL/hr over 60 Minutes Intravenous  Once 01/02/23 1416 01/02/23 2122   01/02/23 1500  ceFAZolin (ANCEF) IVPB 2g/100 mL premix        2 g 200 mL/hr over 30 Minutes Intravenous Every 8 hours 01/02/23 1416 01/04/23 0721   01/02/23 0400  vancomycin (VANCOREADY) IVPB 1500 mg/300 mL        1,500 mg 150 mL/hr over 120 Minutes Intravenous To Surgery 01/01/23 1018 01/02/23 1003   01/02/23 0400  ceFAZolin (ANCEF) IVPB 2g/100 mL premix        2 g 200 mL/hr over 30 Minutes Intravenous To Surgery 01/01/23 1018 01/02/23 1244   01/02/23 0400  ceFAZolin (ANCEF) IVPB 3g/100 mL premix        3 g 200 mL/hr over 30 Minutes Intravenous To Surgery 01/01/23 1018 01/02/23 0825       Assessment/Plan: s/p Procedure(s): Excision of sternal wound with Myriad APPLICATION OF WOUND VAC Plan: -Plan is to take patient back to the operating room tomorrow with Dr. Dillingham for irrigation and debridement of the sternal wound along with placement of myriad and wound VAC change. -N.p.o. after midnight, orders have been placed. -Continue wound VAC suction at 125 mmHg.  Reinforce wound VAC drape edges if needed for leakage.  Continue binder at all times. -Plastics will continue to follow.   LOS: 26 days    Anayia Eugene E Loni Delbridge, PA-C 01/28/2023  

## 2023-01-28 NOTE — PMR Pre-admission (Signed)
PMR Admission Coordinator Pre-Admission Assessment  Patient: Bryan Brock is an 67 y.o., male MRN: [id number] DOB: March 16, 1956 Height: 5\' 11"  (180.3 cm) Weight: (!) 145 kg  Insurance Information HMO:     PPO:      PCP:      IPA:      80/20:      OTHER:  PRIMARY:  Medicare A and B       Policy#: [id number] Subscriber: Pt. Phone#: Verified online    Fax#:  Pre-Cert#:       Employer:  Benefits:  Phone #:      Name:  Eff. Date: Parts A and B effective 12/25/2019  Deduct: $1632      Out of Pocket Max:  None      Life Max: N/A  CIR: 100%      SNF: 100 days Outpatient: 80%     Co-Pay: 20% Home Health58: 100%      Co-Pay: none DME: 80%     Co-Pay: 20% Providers: patient's choice SECONDARY: BCBS Supplement       Policy#: [id number]      Phone#:   Financial Counselor:       Phone#:   The "Data Collection Information Summary" for patients in Inpatient Rehabilitation Facilities with attached "Privacy Act Statement-Health Care Records" was provided and verbally reviewed with: Patient  Emergency Contact Information Contact Information     Name Relation Home Work Mobile   Bryan Brock Significant other  [phone]       Current Medical History  Patient Admitting Diagnosis: CABG, Cardiac Debility  History of Present Illness: Bryan Brock is a 67 year old male who was evaluated earlier this year by Dr. Lavinia Brock concerning coronary artery disease.  He underwent extensive workup and was found to have multivessel disease consisting of serial right coronary artery lesions, high-grade left circumflex disease, are FFR positive LAD D disease and moderate second diagonal disease.  He was counseled regarding recommendations for outpatient cardiothoracic surgical evaluation.  Echocardiogram showed normal left ventricular systolic function and no other valvular abnormality.  He was counseled regarding recommendations for coronary bypass graft surgery is the best treatment for resolution of  his exertional fatigue and shortness of breath and preserve myocardium.  On 5/09, he underwent coronary artery bypass grafting x 4 with endoscopic vein harvest from right and left legs.  He was transferred to the ICU for observation and extubated on 5/09.  Required blood pressure support and was not diuresed.  He was transferred to stepdown unit on 5/11.  He developed a cough and was not using his pillow to stabilize his chest.  The patient was strongly advised regarding risk of sternal dehiscence is higher with morbid obesity.  CT chest was obtained to rule out sternal dehiscence.  On 5/14,  he was returned to the operating room and underwent sternal rewiring.  He was noted to be in atrial fibrillation with controlled rate in the 80s.  Amiodarone was switched to intravenous administration.  Was extubated on 5/15. Chest tubes discontinued 5/20. Some drainage from sternal incision prompted CT examination on 5/22. Chest CT showed displacement of 3rd, 4th and 5th ribs with similar air and fluid levels around the site. Continued dressing changes and Keflex prophylactically .  Diuresed.  He underwent left pleurocentesis on 5/26.  Repeat chest CT 5/26 failed significant anterior chest wall collection on the left and complete dehiscence of the left ribs from the sternum.  He was returned to the operating room on 5/27 and underwent  I&D of peristernal fluid collection and placement of wound VAC.  Continued on cefepime and vancomycin and follow-up wound cultures.  Plastic surgery consultation was obtained on 5/29 and he was returned to the operating room on 5/30 and underwent excision of sternal wound skin and soft tissue and intermediate closure of the wound with placement of wound VAC by Bryan Brock.  He underwent this procedure again on 6/05.  On 6/07 he underwent right pectoralis major muscle advancement flap and placement of wound VAC by Bryan Brock.  He is voiding well and tolerating a carb modified diet.The  patient requires inpatient medicine and rehabilitation evaluations and services for ongoing dysfunction secondary to coronary artery disease status post CABG with sternal dehiscence, pectoralis major muscle flap. Pt. Seen by PT/OT and they recommend CIR to assist return to PLOF.     Patient's medical record from [facility]  has been reviewed by the rehabilitation admission coordinator and physician.  Past Medical History  Past Medical History:  Diagnosis Date   Acne    ADHD (attention deficit hyperactivity disorder)    Arthritis    Asthma    Bipolar 1 disorder (HCC)    Cancer (HCC)    basal cell cancer removed from back   Cataract    Coronary artery disease    Depression    Dyspnea    Dysrhythmia    PVCs   GERD (gastroesophageal reflux disease)    Heart murmur    Hepatitis    remote hx Hepatitis A (caused by food contaminant in childhood)   History of hiatal hernia    Hyperlipidemia    Hypertension    Mitral valve prolapse    Panic attacks    Peripheral vascular disease (HCC)    PFO (patent foramen ovale)    ? small PFO per echo   PONV (postoperative nausea and vomiting)    Pre-diabetes    Sleep apnea     Has the patient had major surgery during 100 days prior to admission? Yes  Family History   family history includes Cancer in his father; Diabetes in his mother; Heart disease in his father and mother; Hyperlipidemia in his father and mother; Obesity in his mother.  Current Medications  Current Facility-Administered Medications:    albuterol (PROVENTIL) (2.5 MG/3ML) 0.083% nebulizer solution 2.5 mg, 2.5 mg, Nebulization, Q4H PRN, Bryan Borne, MD   ALPRAZolam Prudy Feeler) tablet 0.5 mg, 0.5 mg, Oral, BID PRN, Bryan Borne, MD, 0.5 mg at 01/27/23 2232   amiodarone (PACERONE) tablet 200 mg, 200 mg, Oral, Daily, Brock, Bryan R, PA-C, 200 mg at 01/28/23 0900   aspirin EC tablet 325 mg, 325 mg, Oral, Daily, 325 mg at 01/28/23 0900 **OR** [DISCONTINUED]  aspirin chewable tablet 324 mg, 324 mg, Per Tube, Daily, Bartle, Bryan Doughty, MD   bisacodyl (DULCOLAX) EC tablet 10 mg, 10 mg, Oral, Daily, Bryan Borne, MD, 10 mg at 01/28/23 0859   budesonide (PULMICORT) nebulizer solution 0.5 mg, 0.5 mg, Nebulization, BID, Bryan Borne, MD, 0.5 mg at 01/28/23 0759   calcium carbonate (TUMS - dosed in mg elemental calcium) chewable tablet 400 mg of elemental calcium, 400 mg of elemental calcium, Oral, BID PRN, Bryan Borne, MD, 400 mg of elemental calcium at 01/12/23 1121   cephALEXin (KEFLEX) capsule 500 mg, 500 mg, Oral, TID AC & HS, Stehler, Bailey C, PA-C, 500 mg at 01/28/23 1146   chlorpheniramine-HYDROcodone (TUSSIONEX) 10-8 MG/5ML suspension 5 mL, 5 mL, Oral, Q12H, Bartle,  Bryan Doughty, MD, 5 mL at 01/28/23 0901   docusate sodium (COLACE) capsule 200 mg, 200 mg, Oral, Daily, Bryan Borne, MD, 200 mg at 01/28/23 0859   enoxaparin (LOVENOX) injection 40 mg, 40 mg, Subcutaneous, Daily, Bryan Borne, MD, 40 mg at 01/28/23 0858   gabapentin (NEURONTIN) capsule 200 mg, 200 mg, Oral, BID, Stehler, Bailey C, PA-C, 200 mg at 01/28/23 0858   guaiFENesin (MUCINEX) 12 hr tablet 1,200 mg, 1,200 mg, Oral, BID, Bryan Borne, MD, 1,200 mg at 01/28/23 0859   CBG monitoring, , , 4x Daily, AC & HS **AND** insulin aspart (novoLOG) injection 0-24 Units, 0-24 Units, Subcutaneous, TID AC & HS, Bryan Borne, MD, 2 Units at 01/28/23 1147   lactulose (CHRONULAC) 10 GM/15ML solution 20 g, 20 g, Oral, PRN, Loreli Slot, MD   lamoTRIgine (LAMICTAL) tablet 100 mg, 100 mg, Oral, Daily, Bryan Borne, MD, 100 mg at 01/28/23 1146   metoCLOPramide (REGLAN) tablet 10 mg, 10 mg, Oral, TID AC, Bryan Borne, MD, 10 mg at 01/28/23 1146   metoprolol tartrate (LOPRESSOR) tablet 37.5 mg, 37.5 mg, Oral, BID, Bryan Borne, MD, 37.5 mg at 01/28/23 0858   Muscle Rub CREA, , Topical, PRN, Bryan Borne, MD, Given at 01/12/23 1020   nicotine (NICODERM CQ - dosed in mg/24  hours) patch 14 mg, 14 mg, Transdermal, Daily, Bryan Borne, MD, 14 mg at 01/28/23 0907   nutrition supplement (JUVEN) (JUVEN) powder packet 1 packet, 1 packet, Oral, BID BM, Bryan Borne, MD, 1 packet at 01/28/23 0859   ondansetron (ZOFRAN) injection 4 mg, 4 mg, Intravenous, Q6H PRN, Bryan Borne, MD   Oral care mouth rinse, 15 mL, Mouth Rinse, PRN, Bryan Borne, MD   oxyCODONE (Oxy IR/ROXICODONE) immediate release tablet 10 mg, 10 mg, Oral, Q3H PRN, Bryan Borne, MD, 10 mg at 01/28/23 0914   pantoprazole (PROTONIX) EC tablet 40 mg, 40 mg, Oral, Daily, Bryan Borne, MD, 40 mg at 01/28/23 0858   potassium chloride SA (KLOR-CON M) CR tablet 20 mEq, 20 mEq, Oral, BID, Stehler, Bailey C, PA-C, 20 mEq at 01/28/23 0858   rosuvastatin (CRESTOR) tablet 10 mg, 10 mg, Oral, Daily, Bryan Borne, MD, 10 mg at 01/28/23 0902   simethicone (MYLICON) chewable tablet 80 mg, 80 mg, Oral, BID PRN, Bryan Borne, MD, 80 mg at 01/22/23 1356   traMADol (ULTRAM) tablet 50-100 mg, 50-100 mg, Oral, Q4H PRN, Bryan Borne, MD, 100 mg at 01/20/23 2128   zinc sulfate capsule 220 mg, 220 mg, Oral, Daily, Bryan Borne, MD, 220 mg at 01/28/23 0902  Patients Current Diet:  Diet Order             Diet heart healthy/carb modified Room service appropriate? Yes; Fluid consistency: Thin  Diet effective now                   Precautions / Restrictions Precautions Precautions: Sternal, Fall, Other (comment) Precaution Booklet Issued: No Precaution Comments: Watch SpO2, VAC Restrictions Weight Bearing Restrictions: Yes RUE Weight Bearing: Non weight bearing LUE Weight Bearing: Non weight bearing Other Position/Activity Restrictions: sternal   Has the patient had 2 or more falls or a fall with injury in the past year? No  Prior Activity Level  Pt. Active in the community PTA   Prior Functional Level Self Care: Did the patient need help bathing, dressing, using the toilet or eating?  Independent  Indoor Mobility:  Did the patient need assistance with walking from room to room (with or without device)? Independent  Stairs: Did the patient need assistance with internal or external stairs (with or without device)? Independent  Functional Cognition: Did the patient need help planning regular tasks such as shopping or remembering to take medications? Independent  Patient Information Are you of Hispanic, Latino/a,or Spanish origin?: A. No, not of Hispanic, Latino/a, or Spanish origin What is your race?: A. White Do you need or want an interpreter to communicate with a doctor or health care staff?: 0. No  Patient's Response To:  Health Literacy and Transportation Is the patient able to respond to health literacy and transportation needs?: Yes Health Literacy - How often do you need to have someone help you when you read instructions, pamphlets, or other written material from your doctor or pharmacy?: Never In the past 12 months, has lack of transportation kept you from medical appointments or from getting medications?: No In the past 12 months, has lack of transportation kept you from meetings, work, or from getting things needed for daily living?: No  Home Assistive Devices / Equipment Home Assistive Devices/Equipment: Blood pressure cuff, Eyeglasses Home Equipment: Shower seat  Prior Device Use: Indicate devices/aids used by the patient prior to current illness, exacerbation or injury? None of the above  Current Functional Level Cognition  Overall Cognitive Status: Within Functional Limits for tasks assessed Orientation Level: Oriented X4 General Comments: aware of precautions and situation    Extremity Assessment (includes Sensation/Coordination)  Upper Extremity Assessment: Overall WFL for tasks assessed  Lower Extremity Assessment: Defer to PT evaluation    ADLs  Overall ADL's : Needs assistance/impaired Grooming: Oral care, Standing Grooming Details  (indicate cue type and reason): cues to maintain sternal prec Upper Body Dressing : Moderate assistance, Sitting Lower Body Dressing: Maximal assistance, Sit to/from stand Lower Body Dressing Details (indicate cue type and reason): AE training with reacher, sock aide, LH shoehorn, LH sponge, dressing stick, and sock aide Toilet Transfer: Minimal assistance, Ambulation, Rolling walker (2 wheels), Regular Toilet Toilet Transfer Details (indicate cue type and reason): cues for technique Toileting- Clothing Manipulation and Hygiene: Total assistance, Sitting/lateral lean Toileting - Clothing Manipulation Details (indicate cue type and reason): posterior pericare in standing Functional mobility during ADLs: Minimal assistance, Rolling walker (2 wheels) General ADL Comments: focused on AE training for LB dressing and bathing    Mobility  Overal bed mobility: Needs Assistance Bed Mobility: Rolling, Sit to Sidelying Rolling: Supervision Sidelying to sit: Min assist Sit to supine: Min assist Sit to sidelying: Min assist General bed mobility comments: sitting EoB with cardiac rehab on entry    Transfers  Overall transfer level: Needs assistance Equipment used: Rolling walker (2 wheels) Transfers: Sit to/from Stand Sit to Stand: Min guard Bed to/from chair/wheelchair/BSC transfer type:: Step pivot Step pivot transfers: Min assist General transfer comment: min guard for safety, vc for use of sternal pillow during power up to refrain pushing off with UE, light min A for steadying with stepping to recliner    Ambulation / Gait / Stairs / Wheelchair Mobility  Ambulation/Gait Ambulation/Gait assistance: Land (Feet): 70 Feet Assistive device: Rolling walker (2 wheels) Gait Pattern/deviations: Step-through pattern, Decreased stride length General Gait Details: cues for upright posture, Gait velocity: WFL Gait velocity interpretation: 1.31 - 2.62 ft/sec, indicative of limited  community ambulator Stairs: Yes Stairs assistance: Min assist Stair Management: Backwards, With walker Number of Stairs: 1 (x3) General stair comments: backwards stepping  up step, pt with decreased ability to refrain from increased pushing with UE,vc for decreased UE use, limited by Brock knee pain, sequenced to maximize L LE strength, pt ultimately unable to progress to next step up with RW without increased force through UE, discussed that having rails put on his steps would be the safest option. Pt in agreement    Posture / Balance Dynamic Sitting Balance Sitting balance - Comments: EOB without support Balance Overall balance assessment: Needs assistance Sitting-balance support: No upper extremity supported, Feet supported Sitting balance-Leahy Scale: Good Sitting balance - Comments: EOB without support Standing balance support: Bilateral upper extremity supported, During functional activity Standing balance-Leahy Scale: Fair Standing balance comment: can stand without UB support, RW for gait    Special needs/care consideration Wound Vac sternal  and Skin surgical incision    Previous Home Environment (from acute therapy documentation) Living Arrangements: Spouse/significant other Available Help at Discharge: Family, Available 24 hours/day Type of Home: House Home Layout: Two level, Able to live on main level with bedroom/bathroom Home Access: Stairs to enter Entrance Stairs-Number of Steps: 5 Bathroom Shower/Tub: Engineer, manufacturing systems: Handicapped height Bathroom Accessibility: Yes How Accessible: Accessible via walker Home Care Services: No Additional Comments: significant other was ordering RW and shower seat  Discharge Living Setting Plans for Discharge Living Setting: Patient's home Type of Home at Discharge: House Discharge Home Layout: Two level, Able to live on main level with bedroom/bathroom Alternate Level Stairs-Rails: Right, Left Alternate Level  Stairs-Number of Steps: flight Discharge Home Access: Stairs to enter Entrance Stairs-Rails: Right, Left Entrance Stairs-Number of Steps: 5 Discharge Bathroom Shower/Tub: Tub/shower unit Discharge Bathroom Toilet: Handicapped height Discharge Bathroom Accessibility: Yes How Accessible: Accessible via wheelchair, Accessible via walker Does the patient have any problems obtaining your medications?: No  Social/Family/Support Systems Patient Roles: Spouse Contact Information: [phone] Anticipated Caregiver: Lucienne Minks Ability/Limitations of Caregiver: Works days Medical laboratory scientific officer: Evenings only Discharge Plan Discussed with Primary Caregiver: Yes Is Caregiver In Agreement with Plan?: Yes Does Caregiver/Family have Issues with Lodging/Transportation while Pt is in Rehab?: No  Goals Patient/Family Goal for Rehab: PT/OT Mod I Expected length of stay: 5-7 days Pt/Family Agrees to Admission and willing to participate: Yes Program Orientation Provided & Reviewed with Pt/Caregiver Including Roles  & Responsibilities: Yes  Decrease burden of Care through IP rehab admission: Not anticipated   Possible need for SNF placement upon discharge: no   Patient Condition: I have reviewed medical records from [facility] , spoken with CM, and patient. I met with patient at the bedside for inpatient rehabilitation assessment.  Patient will benefit from ongoing PT and OT, can actively participate in 3 hours of therapy a day 5 days of the week, and can make measurable gains during the admission.  Patient will also benefit from the coordinated team approach during an Inpatient Acute Rehabilitation admission.  The patient will receive intensive therapy as well as Rehabilitation physician, nursing, social worker, and care management interventions.  Due to safety, skin/wound care, disease management, medication administration, pain management, and patient education the patient  requires 24 hour a day rehabilitation nursing.  The patient is currently min-min guard with mobility and basic ADLs.  Discharge setting and therapy post discharge at home with home health is anticipated.  Patient has agreed to participate in the Acute Inpatient Rehabilitation Program and will admit today.  Preadmission Screen Completed By:  Jeronimo Greaves, 01/28/2023 1:18 PM ______________________________________________________________________   Discussed status with Dr. Natale Lay  on  02/04/23 at 1000 and received approval for admission today.  Admission Coordinator:  Jeronimo Greaves, CCC-SLP, time 1400/Date 02/04/23   Assessment/Plan: Diagnosis: Debility related to CAD  s/p CABG Does the need for close, 24 hr/day Medical supervision in concert with the patient's rehab needs make it unreasonable for this patient to be served in a less intensive setting? Yes Co-Morbidities requiring supervision/potential complications: HTN, DM2, Tobacco use, CAD, Afib, Gerd, ABLA, hyponatremia  Due to bladder management, bowel management, safety, skin/wound care, disease management, medication administration, pain management, and patient education, does the patient require 24 hr/day rehab nursing? Yes Does the patient require coordinated care of a physician, rehab nurse, PT, OT, and SLP to address physical and functional deficits in the context of the above medical diagnosis(es)? Yes Addressing deficits in the following areas: balance, endurance, locomotion, strength, transferring, bowel/bladder control, bathing, dressing, feeding, grooming, toileting, and psychosocial support Can the patient actively participate in an intensive therapy program of at least 3 hrs of therapy 5 days a week? Yes The potential for patient to make measurable gains while on inpatient rehab is excellent and good Anticipated functional outcomes upon discharge from inpatient rehab: modified independent PT, modified independent OT, modified  independent SLP Estimated rehab length of stay to reach the above functional goals is: 5-7 days Anticipated discharge destination: Home 10. Overall Rehab/Functional Prognosis: excellent   MD Signature: Fanny Dance

## 2023-01-28 NOTE — Telephone Encounter (Signed)
Bryan Brock with [facility] contacted the office stating he is unable to get in touch with patient in order to start home health nursing services. Advised via secure voicemail that patient is still admitted into the hospital and it does not seem as if he will be discharged from the hospital today.

## 2023-01-28 NOTE — Progress Notes (Addendum)
[address]       [place] [address]             [phone]      5 Days Post-Op Procedure(s) (LRB): Excision of sternal wound with Myriad (N/A) APPLICATION OF WOUND VAC (N/A) Subjective: Pt with no new complaints. Wound VAC alerting blockage when entering the room but quickly restarted with suction ranging from 10-135mmHg.   Objective: Vital signs in last 24 hours: Temp:  [97.8 F (36.6 C)-98.6 F (37 C)] 98 F (36.7 C) (06/04 0415) Pulse Rate:  [74-84] 74 (06/04 0415) Cardiac Rhythm: Normal sinus rhythm (06/03 2012) Resp:  [18-21] 20 (06/04 0415) BP: (95-117)/(49-62) 109/61 (06/04 0415) SpO2:  [90 %-100 %] 90 % (06/04 0415)  Hemodynamic parameters for last 24 hours:    Intake/Output from previous day: 06/03 0701 - 06/04 0700 In: -  Out: 925 [Urine:850; Drains:75] Intake/Output this shift: No intake/output data recorded.  General appearance: alert, cooperative, and no distress Neurologic: intact Heart: regular rate and rhythm, S1, S2 normal, no murmur, click, rub or gallop Lungs: clear to auscultation bilaterally Abdomen: soft, non-tender; bowel sounds normal; no masses,  no organomegaly Extremities: edema 2+, ecchymosis around [facility] site improving, Right TED hose in place Wound: Clean and dry EVH sites , wound VAC with a good seal, 200cc in canister yellow/brown tinged serosanguinous drainage  Lab Results: No results for input(s): "WBC", "HGB", "HCT", "PLT" in the last 72 hours. BMET:  Recent Labs    01/27/23 0200 01/28/23 0243  NA 133* 134*  K 3.5 4.0  CL 91* 95*  CO2 30 30  GLUCOSE 107* 111*  BUN 21 20  CREATININE 1.11 1.16  CALCIUM 8.5* 8.4*    PT/INR: No results for input(s): "LABPROT", "INR" in the last 72 hours. ABG    Component Value Date/Time   PHART 7.399 01/08/2023 0830   HCO3 28.2 (H) 01/08/2023 0830   TCO2 30 01/08/2023 0830   ACIDBASEDEF 1.0 01/07/2023 1936   O2SAT 97 01/08/2023 0830   CBG (last 3)  Recent  Labs    01/27/23 1247 01/27/23 1654 01/27/23 2107  GLUCAP 121* 92 86    Assessment/Plan: S/P Procedure(s) (LRB): Excision of sternal wound with Myriad (N/A) APPLICATION OF WOUND VAC (N/A)  CV: NSR, HR 70s. SBP 109. Hx of afib. Continue Amiodarone 200mg  daily and Lopressor 37.5mg  BID.   Pulm: Saturating 90-100% on RA. Encourage IS, flutter valve and ambulation.   GI: Good appetite, no nausea. +BM 06/02.    Endo: CBGs controlled on SSI. New diabetes diagnosis A1C 6.6, outpatient appt with PCP has been arranged.   Renal: Cr 1.16. UO 850cc/24hrs. Will give a dose of Lasix due to drop in UO    Hypokalemia: Resolved, K 4.0   ID: Leukocytosis resolved, afebrile. On Empiric Keflex.   Hyponatremia: Mild and improved, Na 134. Will monitor.   Sternal dehiscence: Wound VAC changed by plastic surgery team yesterday. VAC alerting blockage when entering the room, but later suction ranging from 10-166mmHg. Good seal, no leak. About 200cc of yellow/brown tinged serosanguinous drainage in canister. Plastics plan to take him back to the OR for dressing change and debridement if needed. Plastics to determine if VAC needs to be changed prior due to blockage.    Deconditioning: CIR evaluated, currently a candidate and they will continue to follow him. PT recommended CIR. Continue work with PT/OT.  Dispo: Plastic surgery to take pt to OR tomorrow. Hopefully if he remains  a candidate for CIR can d/c by the end of the week. If not may have to look into SNF since he will not have full time help at home.    LOS: 26 days    Jenny Reichmann, PA-C 01/28/2023

## 2023-01-28 NOTE — H&P (View-Only) (Signed)
5 Days Post-Op  Subjective: Patient is a 67 year old male with history of a sternal wound. He underwent excision of sternal wound and soft tissue, intermediate closure of the wound and placement of myriad and powder as well as wound VAC with Dr. Ulice Bold on 01/23/2023.  He is 5 days postop.  Patient was seen yesterday.  Wound VAC was changed as wound VAC was not functioning over the weekend.  Patient tolerated wound VAC change well.  Overnight, wound VAC was alerting with blockages.  Per CT surgery PA, wound VAC was holding suction anywhere between 10 mmHg to 100 mmHg.  She requested advice for the malfunctioning wound VAC.  Upon entering the room, patient was sitting upright in no acute distress.  He had just finished with physical therapy.  He reports he is doing well.  He reports he heard some sloshing earlier today, but it has improved.  Objective: Vital signs in last 24 hours: Temp:  [97.8 F (36.6 C)-98.8 F (37.1 C)] 98.6 F (37 C) (06/04 1100) Pulse Rate:  [74-93] 76 (06/04 1100) Resp:  [14-20] 14 (06/04 1100) BP: (101-144)/(49-125) 144/125 (06/04 1100) SpO2:  [70 %-100 %] 93 % (06/04 1100) Last BM Date : 01/26/23  Intake/Output from previous day: 06/03 0701 - 06/04 0700 In: -  Out: 925 [Urine:850; Drains:75] Intake/Output this shift: No intake/output data recorded.  General appearance: alert, cooperative, and no distress Resp: Unlabored breathing, no respiratory distress Chest wall: Wound VAC noted with new canister was at 50 mmHg.  There was a whistling sound noted at the wound VAC site.  There appears to be some drainage underneath the wound VAC drains.  Wound VAC sponges and drapes were removed.  There was some serous appearing drainage noted on the skin.  There is no surrounding erythema on the skin.  There is no malodor.  Sorbact was left in place.  New white wound VAC sponge and new black wound VAC sponge were applied over the Sorbact along with wound VAC drapes.  New  canister was placed on the wound VAC machine as well.  Suction was set to 125 mmHg.  Adequate seal was noted.  No signs of leakage after change.  Lab Results:     Latest Ref Rng & Units 01/24/2023   12:53 AM 01/22/2023    2:47 AM 01/21/2023    1:01 AM  CBC  WBC 4.0 - 10.5 K/uL 8.4  9.6  9.5   Hemoglobin 13.0 - 17.0 g/dL 8.9  9.6  9.1   Hematocrit 39.0 - 52.0 % 28.4  30.7  29.1   Platelets 150 - 400 K/uL 376  470  496     BMET Recent Labs    01/27/23 0200 01/28/23 0243  NA 133* 134*  K 3.5 4.0  CL 91* 95*  CO2 30 30  GLUCOSE 107* 111*  BUN 21 20  CREATININE 1.11 1.16  CALCIUM 8.5* 8.4*   PT/INR No results for input(s): "LABPROT", "INR" in the last 72 hours. ABG No results for input(s): "PHART", "HCO3" in the last 72 hours.  Invalid input(s): "PCO2", "PO2"  Studies/Results: No results found.  Anti-infectives: Anti-infectives (From admission, onward)    Start     Dose/Rate Route Frequency Ordered Stop   01/24/23 0800  vancomycin (VANCOREADY) IVPB 1500 mg/300 mL  Status:  Discontinued        1,500 mg 150 mL/hr over 120 Minutes Intravenous Every 24 hours 01/23/23 1303 01/24/23 0750   01/24/23 0800  cephALEXin (KEFLEX) capsule 500  mg        500 mg Oral 3 times daily before meals & bedtime 01/24/23 0750 01/30/23 2159   01/23/23 0600  ceFAZolin (ANCEF) IVPB 3g/100 mL premix        3 g 200 mL/hr over 30 Minutes Intravenous On call to O.R. 01/22/23 2011 01/23/23 1506   01/20/23 1400  ceFEPIme (MAXIPIME) 2 g in sodium chloride 0.9 % 100 mL IVPB  Status:  Discontinued        2 g 200 mL/hr over 30 Minutes Intravenous Every 8 hours 01/20/23 1017 01/24/23 0750   01/20/23 0600  vancomycin (VANCOREADY) IVPB 1500 mg/300 mL  Status:  Discontinued        1,500 mg 150 mL/hr over 120 Minutes Intravenous On call to O.R. 01/19/23 1849 01/19/23 1917   01/19/23 2015  vancomycin (VANCOREADY) IVPB 1500 mg/300 mL  Status:  Discontinued        1,500 mg 150 mL/hr over 120 Minutes Intravenous  Every 24 hours 01/19/23 1917 01/23/23 1303   01/16/23 0745  cephALEXin (KEFLEX) capsule 500 mg  Status:  Discontinued        500 mg Oral Every 8 hours 01/16/23 0652 01/20/23 1017   01/07/23 1930  ceFAZolin (ANCEF) IVPB 2g/100 mL premix        2 g 200 mL/hr over 30 Minutes Intravenous Every 8 hours 01/07/23 1738 01/09/23 1513   01/07/23 0600  ceFAZolin (ANCEF) IVPB 3g/100 mL premix        3 g 200 mL/hr over 30 Minutes Intravenous On call to O.R. 01/06/23 1428 01/07/23 1411   01/02/23 2000  vancomycin (VANCOCIN) IVPB 1000 mg/200 mL premix        1,000 mg 200 mL/hr over 60 Minutes Intravenous  Once 01/02/23 1416 01/02/23 2122   01/02/23 1500  ceFAZolin (ANCEF) IVPB 2g/100 mL premix        2 g 200 mL/hr over 30 Minutes Intravenous Every 8 hours 01/02/23 1416 01/04/23 0721   01/02/23 0400  vancomycin (VANCOREADY) IVPB 1500 mg/300 mL        1,500 mg 150 mL/hr over 120 Minutes Intravenous To Surgery 01/01/23 1018 01/02/23 1003   01/02/23 0400  ceFAZolin (ANCEF) IVPB 2g/100 mL premix        2 g 200 mL/hr over 30 Minutes Intravenous To Surgery 01/01/23 1018 01/02/23 1244   01/02/23 0400  ceFAZolin (ANCEF) IVPB 3g/100 mL premix        3 g 200 mL/hr over 30 Minutes Intravenous To Surgery 01/01/23 1018 01/02/23 0825       Assessment/Plan: s/p Procedure(s): Excision of sternal wound with Myriad APPLICATION OF WOUND VAC Plan: -Plan is to take patient back to the operating room tomorrow with Dr. Ulice Bold for irrigation and debridement of the sternal wound along with placement of myriad and wound VAC change. -N.p.o. after midnight, orders have been placed. -Continue wound VAC suction at 125 mmHg.  Reinforce wound VAC drape edges if needed for leakage.  Continue binder at all times. -Plastics will continue to follow.   LOS: 26 days    Laurena Spies, PA-C 01/28/2023

## 2023-01-28 NOTE — Progress Notes (Signed)
Mobility Specialist Progress Note:    01/28/23 1618  Mobility  Activity Ambulated with assistance in hallway  Level of Assistance Contact guard assist, steadying assist  Assistive Device Front wheel walker  Distance Ambulated (ft) 400 ft  Activity Response Tolerated well  Mobility Referral Yes  $Mobility charge 1 Mobility  Mobility Specialist Start Time (ACUTE ONLY) 1545  Mobility Specialist Stop Time (ACUTE ONLY) 1612  Mobility Specialist Time Calculation (min) (ACUTE ONLY) 27 min   Pt received in chair, eager to ambulate. Pt c/o SOB and overall weakness. Pt took x4 standing breaks. Pt assisted back to bed w/ call bell in hand and PA in room.   Thompson Grayer Mobility Specialist  Please contact vis Secure Chat or  Rehab Office [phone]

## 2023-01-28 NOTE — Progress Notes (Signed)
Inpatient Rehab Admissions Coordinator:   CIR following for potential admit once medically ready. Will follow up after procedure tomorrow and assess needs at that time.   Megan Salon, MS, CCC-SLP Rehab Admissions Coordinator  [phone] (celll) [phone] (office)

## 2023-01-29 ENCOUNTER — Encounter (HOSPITAL_COMMUNITY): Payer: Self-pay | Admitting: Surgery

## 2023-01-29 ENCOUNTER — Inpatient Hospital Stay (HOSPITAL_COMMUNITY): Payer: Medicare Other | Admitting: Certified Registered Nurse Anesthetist

## 2023-01-29 ENCOUNTER — Encounter (HOSPITAL_COMMUNITY)
Admission: RE | Disposition: A | Discharge status: Still In Hospital | Payer: Self-pay | Source: Ambulatory Visit | Attending: Surgery

## 2023-01-29 DIAGNOSIS — G4730 Sleep apnea, unspecified: Secondary | ICD-10-CM

## 2023-01-29 DIAGNOSIS — I10 Essential (primary) hypertension: Secondary | ICD-10-CM | POA: Diagnosis not present

## 2023-01-29 DIAGNOSIS — I2510 Atherosclerotic heart disease of native coronary artery without angina pectoris: Secondary | ICD-10-CM

## 2023-01-29 DIAGNOSIS — T8131XA Disruption of external operation (surgical) wound, not elsewhere classified, initial encounter: Secondary | ICD-10-CM | POA: Diagnosis not present

## 2023-01-29 DIAGNOSIS — S21109A Unspecified open wound of unspecified front wall of thorax without penetration into thoracic cavity, initial encounter: Secondary | ICD-10-CM | POA: Diagnosis not present

## 2023-01-29 HISTORY — PX: APPLICATION OF WOUND VAC: SHX5189

## 2023-01-29 HISTORY — PX: INCISION AND DRAINAGE OF WOUND: SHX1803

## 2023-01-29 LAB — GLUCOSE, CAPILLARY
Glucose-Capillary: 127 mg/dL — ABNORMAL HIGH (ref 70–99)
Glucose-Capillary: 120 mg/dL — ABNORMAL HIGH (ref 70–99)
Glucose-Capillary: 93 mg/dL (ref 70–99)
Glucose-Capillary: 190 mg/dL — ABNORMAL HIGH (ref 70–99)

## 2023-01-29 SURGERY — IRRIGATION AND DEBRIDEMENT WOUND
Anesthesia: General | Site: Chest

## 2023-01-29 MED ORDER — CEFAZOLIN IN SODIUM CHLORIDE 3-0.9 GM/100ML-% IV SOLN
INTRAVENOUS | Status: AC
  Filled 2023-01-29: qty 100

## 2023-01-29 MED ORDER — MEPERIDINE HCL 25 MG/ML IJ SOLN: 6.2500 mg | INTRAMUSCULAR | Status: DC | PRN

## 2023-01-29 MED ORDER — LACTATED RINGERS IV SOLN: INTRAVENOUS | Status: DC

## 2023-01-29 MED ORDER — ORAL CARE MOUTH RINSE: 15.0000 mL | Freq: Once | OROMUCOSAL | Status: AC

## 2023-01-29 MED ORDER — PROMETHAZINE HCL 25 MG/ML IJ SOLN: 6.2500 mg | INTRAMUSCULAR | Status: DC | PRN

## 2023-01-29 MED ORDER — AMISULPRIDE (ANTIEMETIC) 5 MG/2ML IV SOLN: 10.0000 mg | Freq: Once | INTRAVENOUS | Status: DC | PRN

## 2023-01-29 MED ORDER — ONDANSETRON HCL 4 MG/2ML IJ SOLN
INTRAMUSCULAR | Status: DC | PRN
  Administered 2023-01-29: 4 mg via INTRAVENOUS

## 2023-01-29 MED ORDER — VASHE WOUND IRRIGATION OPTIME
TOPICAL | Status: DC | PRN
  Administered 2023-01-29: 34 [oz_av]

## 2023-01-29 MED ORDER — LIDOCAINE-EPINEPHRINE 1 %-1:100000 IJ SOLN
INTRAMUSCULAR | Status: AC
  Filled 2023-01-29: qty 1

## 2023-01-29 MED ORDER — 0.9 % SODIUM CHLORIDE (POUR BTL) OPTIME
TOPICAL | Status: DC | PRN
  Administered 2023-01-29: 1000 mL

## 2023-01-29 MED ORDER — PROPOFOL 10 MG/ML IV BOLUS
INTRAVENOUS | Status: DC | PRN
  Administered 2023-01-29: 170 mg via INTRAVENOUS

## 2023-01-29 MED ORDER — SUGAMMADEX SODIUM 200 MG/2ML IV SOLN
INTRAVENOUS | Status: DC | PRN
  Administered 2023-01-29: 400 mg via INTRAVENOUS

## 2023-01-29 MED ORDER — PROPOFOL 10 MG/ML IV BOLUS
INTRAVENOUS | Status: AC
  Filled 2023-01-29: qty 20

## 2023-01-29 MED ORDER — PHENYLEPHRINE HCL-NACL 20-0.9 MG/250ML-% IV SOLN
INTRAVENOUS | Status: DC | PRN
  Administered 2023-01-29: 25 ug/min via INTRAVENOUS

## 2023-01-29 MED ORDER — CHLORHEXIDINE GLUCONATE 0.12 % MT SOLN
15.0000 mL | Freq: Once | OROMUCOSAL | Status: AC
  Administered 2023-01-29: 15 mL via OROMUCOSAL

## 2023-01-29 MED ORDER — CEFAZOLIN SODIUM-DEXTROSE 2-4 GM/100ML-% IV SOLN
INTRAVENOUS | Status: AC
  Filled 2023-01-29: qty 100

## 2023-01-29 MED ORDER — DEXAMETHASONE SODIUM PHOSPHATE 10 MG/ML IJ SOLN
INTRAMUSCULAR | Status: DC | PRN
  Administered 2023-01-29: 5 mg via INTRAVENOUS

## 2023-01-29 MED ORDER — LIDOCAINE 2% (20 MG/ML) 5 ML SYRINGE
INTRAMUSCULAR | Status: DC | PRN
  Administered 2023-01-29: 100 mg via INTRAVENOUS

## 2023-01-29 MED ORDER — FENTANYL CITRATE (PF) 100 MCG/2ML IJ SOLN
25.0000 ug | INTRAMUSCULAR | Status: DC | PRN
  Administered 2023-01-29: 50 ug via INTRAVENOUS

## 2023-01-29 MED ORDER — FENTANYL CITRATE (PF) 250 MCG/5ML IJ SOLN
INTRAMUSCULAR | Status: DC | PRN
  Administered 2023-01-29: 50 ug via INTRAVENOUS

## 2023-01-29 MED ORDER — FENTANYL CITRATE (PF) 100 MCG/2ML IJ SOLN
INTRAMUSCULAR | Status: AC
  Filled 2023-01-29: qty 2

## 2023-01-29 MED ORDER — ACETAMINOPHEN 500 MG PO TABS
1000.0000 mg | ORAL_TABLET | Freq: Once | ORAL | Status: AC
  Administered 2023-01-29: 1000 mg via ORAL

## 2023-01-29 MED ORDER — ROCURONIUM BROMIDE 10 MG/ML (PF) SYRINGE
PREFILLED_SYRINGE | INTRAVENOUS | Status: DC | PRN
  Administered 2023-01-29: 50 mg via INTRAVENOUS

## 2023-01-29 MED ORDER — FENTANYL CITRATE (PF) 250 MCG/5ML IJ SOLN
INTRAMUSCULAR | Status: AC
  Filled 2023-01-29: qty 5

## 2023-01-29 MED ORDER — CHLORHEXIDINE GLUCONATE 0.12 % MT SOLN
OROMUCOSAL | Status: AC
  Filled 2023-01-29: qty 15

## 2023-01-29 MED ORDER — ACETAMINOPHEN 10 MG/ML IV SOLN
INTRAVENOUS | Status: AC
  Filled 2023-01-29: qty 100

## 2023-01-29 SURGICAL SUPPLY — 62 items
APL SKNCLS STERI-STRIP NONHPOA (GAUZE/BANDAGES/DRESSINGS) ×1
APL SWBSTK 6 STRL LF DISP (MISCELLANEOUS)
APPLICATOR COTTON TIP 6 STRL (MISCELLANEOUS) IMPLANT
APPLICATOR COTTON TIP 6IN STRL (MISCELLANEOUS) IMPLANT
BAG COUNTER SPONGE SURGICOUNT (BAG) ×1 IMPLANT
BAG DECANTER FOR FLEXI CONT (MISCELLANEOUS) IMPLANT
BAG SPNG CNTER NS LX DISP (BAG) ×1
BENZOIN TINCTURE PRP APPL 2/3 (GAUZE/BANDAGES/DRESSINGS) ×1 IMPLANT
CANISTER SUCT 3000ML PPV (MISCELLANEOUS) ×1 IMPLANT
CANISTER WOUND CARE 500ML ATS (WOUND CARE) IMPLANT
CLEANSER WND VASHE 34 (WOUND CARE) IMPLANT
CNTNR URN SCR LID CUP LEK RST (MISCELLANEOUS) IMPLANT
CONT SPEC 4OZ STRL OR WHT (MISCELLANEOUS)
COVER SURGICAL LIGHT HANDLE (MISCELLANEOUS) ×1 IMPLANT
DRAIN CHANNEL 19F RND (DRAIN) IMPLANT
DRAIN JP 10F RND SILICONE (MISCELLANEOUS) IMPLANT
DRAPE HALF SHEET 40X57 (DRAPES) IMPLANT
DRAPE IMP U-DRAPE 54X76 (DRAPES) ×1 IMPLANT
DRAPE INCISE IOBAN 66X45 STRL (DRAPES) IMPLANT
DRAPE LAPAROSCOPIC ABDOMINAL (DRAPES) IMPLANT
DRAPE LAPAROTOMY 100X72 PEDS (DRAPES) ×1 IMPLANT
DRESSING MORCELLS FINE 1000 (Tissue) IMPLANT
DRSG ADAPTIC 3X8 NADH LF (GAUZE/BANDAGES/DRESSINGS) IMPLANT
DRSG CUTIMED SORBACT 7X9 (GAUZE/BANDAGES/DRESSINGS) IMPLANT
DRSG HYDROCOLLOID 4X4 (GAUZE/BANDAGES/DRESSINGS) IMPLANT
DRSG VAC GRANUFOAM LG (GAUZE/BANDAGES/DRESSINGS) IMPLANT
DRSG VAC GRANUFOAM MED (GAUZE/BANDAGES/DRESSINGS) IMPLANT
DRSG VAC GRANUFOAM SM (GAUZE/BANDAGES/DRESSINGS) IMPLANT
DRSG VERSA FOAM LRG 10X15 (GAUZE/BANDAGES/DRESSINGS) IMPLANT
ELECT CAUTERY BLADE 6.4 (BLADE) IMPLANT
ELECT REM PT RETURN 9FT ADLT (ELECTROSURGICAL) ×1
ELECTRODE REM PT RTRN 9FT ADLT (ELECTROSURGICAL) ×1 IMPLANT
GAUZE PAD ABD 8X10 STRL (GAUZE/BANDAGES/DRESSINGS) IMPLANT
GAUZE SPONGE 4X4 12PLY STRL (GAUZE/BANDAGES/DRESSINGS) IMPLANT
GLOVE BIO SURGEON STRL SZ 6.5 (GLOVE) ×1 IMPLANT
GLOVE BIOGEL M 6.5 STRL (GLOVE) ×1 IMPLANT
GLOVE SURG SS PI 6.5 STRL IVOR (GLOVE) IMPLANT
GLOVE SURG SS PI 7.5 STRL IVOR (GLOVE) IMPLANT
GOWN STRL REUS W/ TWL LRG LVL3 (GOWN DISPOSABLE) ×3 IMPLANT
GOWN STRL REUS W/TWL LRG LVL3 (GOWN DISPOSABLE) ×3
KIT BASIN OR (CUSTOM PROCEDURE TRAY) ×1 IMPLANT
KIT TURNOVER KIT B (KITS) ×1 IMPLANT
NDL HYPO 25GX1X1/2 BEV (NEEDLE) ×1 IMPLANT
NEEDLE HYPO 25GX1X1/2 BEV (NEEDLE) ×1 IMPLANT
NS IRRIG 1000ML POUR BTL (IV SOLUTION) ×1 IMPLANT
PACK GENERAL/GYN (CUSTOM PROCEDURE TRAY) ×1 IMPLANT
PACK UNIVERSAL I (CUSTOM PROCEDURE TRAY) ×1 IMPLANT
PAD ARMBOARD 7.5X6 YLW CONV (MISCELLANEOUS) ×2 IMPLANT
STAPLER VISISTAT 35W (STAPLE) ×1 IMPLANT
SURGILUBE 2OZ TUBE FLIPTOP (MISCELLANEOUS) IMPLANT
SUT MNCRL AB 3-0 PS2 27 (SUTURE) IMPLANT
SUT MNCRL AB 4-0 PS2 18 (SUTURE) IMPLANT
SUT MON AB 2-0 CT1 36 (SUTURE) IMPLANT
SUT MON AB 5-0 PS2 18 (SUTURE) IMPLANT
SUT VIC AB 5-0 PS2 18 (SUTURE) IMPLANT
SUT VICRYL 3 0 (SUTURE) IMPLANT
SWAB COLLECTION DEVICE MRSA (MISCELLANEOUS) IMPLANT
SWAB CULTURE ESWAB REG 1ML (MISCELLANEOUS) IMPLANT
SYR BULB IRRIG 60ML STRL (SYRINGE) IMPLANT
SYR CONTROL 10ML LL (SYRINGE) ×1 IMPLANT
TOWEL GREEN STERILE (TOWEL DISPOSABLE) ×1 IMPLANT
UNDERPAD 30X36 HEAVY ABSORB (UNDERPADS AND DIAPERS) ×1 IMPLANT

## 2023-01-29 NOTE — Anesthesia Procedure Notes (Signed)
Procedure Name: Intubation Date/Time: 01/29/2023 4:39 PM  Performed by: Samara Deist, CRNAPre-anesthesia Checklist: Patient identified, Emergency Drugs available, Suction available, Patient being monitored and Timeout performed Patient Re-evaluated:Patient Re-evaluated prior to induction Oxygen Delivery Method: Circle system utilized Preoxygenation: Pre-oxygenation with 100% oxygen Induction Type: IV induction Ventilation: Mask ventilation without difficulty Laryngoscope Size: Mac and 4 Grade View: Grade I Tube type: Oral Tube size: 7.5 mm Number of attempts: 1 Airway Equipment and Method: Stylet Placement Confirmation: ETT inserted through vocal cords under direct vision, positive ETCO2 and breath sounds checked- equal and bilateral Secured at: 21 cm Tube secured with: Tape Dental Injury: Teeth and Oropharynx as per pre-operative assessment

## 2023-01-29 NOTE — Progress Notes (Signed)
CARDIAC REHAB PHASE I   Ambulated in hall with PT. Pt reports tolerating well. Scheduled for OR this afternoon. Will continue to follow.    [id number]  Woodroe Chen, RN BSN 01/29/2023 1100

## 2023-01-29 NOTE — Op Note (Signed)
DATE OF OPERATION: 01/29/2023  LOCATION: [facility] Room  PREOPERATIVE DIAGNOSIS: Sternal wound  POSTOPERATIVE DIAGNOSIS: Same  PROCEDURE:  Excision of sternal wound 5  x 14 x 3.5 cm skin and soft tissue (2 x 2 cm) Placement of Myriad 1 gm powder Placement of VAC  SURGEON: Tashonda Pinkus [employer], DO  ASSISTANT: Evelena Leyden, PA  EBL: none  CONDITION: Stable  COMPLICATIONS: None  INDICATION: The patient, Bryan Brock, is a 66 y.o. male born on [birth year], is here for treatment of a sternal wound after a CABG surgery.   PROCEDURE DETAILS:  The patient was seen prior to surgery and marked.  The IV antibiotics were given. The patient was taken to the operating room and given a general anesthetic. A standard time out was performed and all information was confirmed by those in the room. SCDs were placed.   The sternal wound was irrigated with Vashe.  It is 5 x 15 x 3.5 cm.  The #10 blade and curette were used to excise the skin and soft tissue was that nonviable.  This was a 2 x 2 cm area.   All of the myriad powder was placed.  The Sorbact was applied followed by the white sponge and then the black sponge.  The VAC was applied.  There was an excellent seal.  The patient was allowed to wake up and taken to recovery room in stable condition at the end of the case. The family was notified at the end of the case.   The advanced practice practitioner (APP) assisted throughout the case.  The APP was essential in retraction and counter traction when needed to make the case progress smoothly.  This retraction and assistance made it possible to see the tissue plans for the procedure.  The assistance was needed for blood control, tissue re-approximation and assisted with closure of the incision site.

## 2023-01-29 NOTE — Progress Notes (Signed)
Nutrition Brief Note  Visited patient at bedside to follow up on diet edu. He reports he has been eating well and is NPO for his 5th proceudre today. Patient is POD 28 CABGx4. He is tolerating Juven BID. CIR vs SNF pending.   Patient has had good wound progression with wound vac.   No c/o N/V/D/C. No c/o chewing/swallowing difficulties.   Labs, meds, and weight have been reviewed.   RD signing off  Leodis Rains, RDN, LDN  Clinical Nutrition

## 2023-01-29 NOTE — Progress Notes (Signed)
Physical Therapy Treatment Patient Details Name: AMELIO KALKOWSKI MRN: [id number] DOB: 12/26/1955 Today's Date: 01/29/2023   History of Present Illness 67 yo male admitted 5/9 for CABG x 4. 5/14 sternal rewiring due to dehiscence coughing. 5/27 sternal wound I&D with VAC placement. PMhx: HTN, HLD, bipolar d/o, obesity, sleep apnea, mitral valve prolapse, GERD.    PT Comments    Pt required min assist bed mobility, min guard assist transfers, and min guard assist amb 400' with bariatric RW. Multiple standing rest breaks during amb due to 1/4 DOE. Max HR 102. SpO2 99% on RA. Cues for relaxation and pursed lip breathing. Pt returned to bed at end of session. Plan for return to OR this afternoon for repeat I&D and possible wound closure.    Recommendations for follow up therapy are one component of a multi-disciplinary discharge planning process, led by the attending physician.  Recommendations may be updated based on patient status, additional functional criteria and insurance authorization.  Follow Up Recommendations       Assistance Recommended at Discharge Intermittent Supervision/Assistance  Patient can return home with the following A little help with walking and/or transfers;A little help with bathing/dressing/bathroom;Assistance with cooking/housework;Assist for transportation;Help with stairs or ramp for entrance   Equipment Recommendations  Rolling walker (2 wheels) (bariatric)    Recommendations for Other Services       Precautions / Restrictions Precautions Precautions: Sternal;Fall;Other (comment) Precaution Comments: Watch SpO2, sternal wound vac     Mobility  Bed Mobility Overal bed mobility: Needs Assistance Bed Mobility: Rolling, Sit to Sidelying, Sidelying to Sit Rolling: Supervision Sidelying to sit: Min assist     Sit to sidelying: Min assist General bed mobility comments: increased time    Transfers Overall transfer level: Needs assistance Equipment used:  Rolling walker (2 wheels) (bariatric) Transfers: Sit to/from Stand Sit to Stand: Min guard           General transfer comment: Pt using momentum to power up.    Ambulation/Gait Ambulation/Gait assistance: Min guard Gait Distance (Feet): 400 Feet Assistive device: Rolling walker (2 wheels) Gait Pattern/deviations: Step-through pattern, Decreased stride length Gait velocity: decreased Gait velocity interpretation: <1.31 ft/sec, indicative of household ambulator   General Gait Details: cues for posture and proximity to RW. Multiple standing rest breaks. 1/4 DOE. HR 102. SpO2 99% on RA.   Stairs             Wheelchair Mobility    Modified Rankin (Stroke Patients Only)       Balance Overall balance assessment: Needs assistance Sitting-balance support: No upper extremity supported, Feet supported Sitting balance-Leahy Scale: Good     Standing balance support: Bilateral upper extremity supported, During functional activity Standing balance-Leahy Scale: Fair Standing balance comment: can stand without UB support, RW for gait                            Cognition Arousal/Alertness: Awake/alert Behavior During Therapy: WFL for tasks assessed/performed Overall Cognitive Status: Within Functional Limits for tasks assessed                                 General Comments: hyperverbose, distractable        Exercises      General Comments General comments (skin integrity, edema, etc.): SpO2 99% on RA during amb. 1/4 DOE. Cues for relaxation and pursed lip breathing.  Pertinent Vitals/Pain Pain Assessment Pain Assessment: Faces Faces Pain Scale: Hurts little more Pain Location: L foot Pain Descriptors / Indicators: Grimacing, Discomfort Pain Intervention(s): Monitored during session, Repositioned    Home Living                          Prior Function            PT Goals (current goals can now be found in the care  plan section) Acute Rehab PT Goals Patient Stated Goal: rehab then home    Frequency    Min 1X/week      PT Plan Current plan remains appropriate    Co-evaluation              AM-PAC PT "6 Clicks" Mobility   Outcome Measure  Help needed turning from your back to your side while in a flat bed without using bedrails?: A Little Help needed moving from lying on your back to sitting on the side of a flat bed without using bedrails?: A Little Help needed moving to and from a bed to a chair (including a wheelchair)?: A Little Help needed standing up from a chair using your arms (e.g., wheelchair or bedside chair)?: A Little Help needed to walk in hospital room?: A Little Help needed climbing 3-5 steps with a railing? : A Little 6 Click Score: 18    End of Session Equipment Utilized During Treatment: Gait belt Activity Tolerance: Patient tolerated treatment well Patient left: in bed;with call bell/phone within reach Nurse Communication: Mobility status PT Visit Diagnosis: Other abnormalities of gait and mobility (R26.89);Difficulty in walking, not elsewhere classified (R26.2)     Time: 1610-9604 PT Time Calculation (min) (ACUTE ONLY): 30 min  Charges:  $Gait Training: 23-37 mins                     Ferd Glassing., PT  Office # [phone]    Ilda Foil 01/29/2023, 10:05 AM

## 2023-01-29 NOTE — Anesthesia Preprocedure Evaluation (Signed)
Anesthesia Evaluation  Patient identified by MRN, date of birth, ID band Patient awake    Reviewed: Allergy & Precautions, NPO status , Patient's Chart, lab work & pertinent test results  History of Anesthesia Complications (+) PONV and history of anesthetic complications  Airway Mallampati: III  TM Distance: >3 FB Neck ROM: Full    Dental  (+) Teeth Intact, Dental Advisory Given   Pulmonary shortness of breath, asthma , sleep apnea    breath sounds clear to auscultation       Cardiovascular hypertension, Pt. on medications + CAD, + CABG and + Peripheral Vascular Disease  + dysrhythmias + Valvular Problems/Murmurs MVP  Rhythm:Regular Rate:Normal  Echo 10/2022  1. Left ventricular ejection fraction, by estimation, is 55 to 60%. The left ventricle has normal function. The left ventricle has no regional wall motion abnormalities. There is mild concentric left ventricular hypertrophy. Left ventricular diastolic parameters are consistent with Grade I diastolic dysfunction (impaired relaxation). Elevated left atrial pressure.   2. Right ventricular systolic function is normal. The right ventricular size is normal.   3. Left atrial size was moderately dilated.   4. The mitral valve is normal in structure. No evidence of mitral valve regurgitation.   5. The aortic valve is tricuspid. Aortic valve regurgitation is not visualized. No aortic stenosis is present.     Neuro/Psych  PSYCHIATRIC DISORDERS Anxiety Depression Bipolar Disorder      GI/Hepatic hiatal hernia,GERD  ,,(+) Hepatitis -  Endo/Other  negative endocrine ROS    Renal/GU negative Renal ROS     Musculoskeletal  (+) Arthritis ,    Abdominal  (+) + obese  Peds  Hematology negative hematology ROS (+)   Anesthesia Other Findings   Reproductive/Obstetrics                              Anesthesia Physical Anesthesia Plan  ASA: 3  Anesthesia  Plan: General   Post-op Pain Management: Tylenol PO (pre-op)*   Induction: Intravenous  PONV Risk Score and Plan: 3 and Ondansetron, Treatment may vary due to age or medical condition and Dexamethasone  Airway Management Planned: LMA  Additional Equipment:   Intra-op Plan:   Post-operative Plan: Extubation in OR  Informed Consent: I have reviewed the patients History and Physical, chart, labs and discussed the procedure including the risks, benefits and alternatives for the proposed anesthesia with the patient or authorized representative who has indicated his/her understanding and acceptance.     Dental advisory given  Plan Discussed with: CRNA  Anesthesia Plan Comments:         Anesthesia Quick Evaluation

## 2023-01-29 NOTE — Transfer of Care (Signed)
Immediate Anesthesia Transfer of Care Note  Patient: Bryan Brock  Procedure(s) Performed: Sternal wound irrigation and debridement, placement of myriad and wound VAC change (Chest) APPLICATION OF SKIN SUBSTITUTE (Chest) APPLICATION OF WOUND VAC (Chest)  Patient Location: PACU  Anesthesia Type:General  Level of Consciousness: awake, alert , and oriented  Airway & Oxygen Therapy: Patient Spontanous Breathing and Patient connected to face mask oxygen  Post-op Assessment: Report given to RN and Post -op Vital signs reviewed and stable  Post vital signs: Reviewed and stable  Last Vitals:  Vitals Value Taken Time  BP 139/77 01/29/23 1720  Temp    Pulse 78 01/29/23 1723  Resp 25 01/29/23 1723  SpO2 94 % 01/29/23 1723  Vitals shown include unvalidated device data.  Last Pain:  Vitals:   01/29/23 1555  TempSrc:   PainSc: 0-No pain      Patients Stated Pain Goal: 3 (01/29/23 0400)  Complications: No notable events documented.

## 2023-01-29 NOTE — Interval H&P Note (Signed)
History and Physical Interval Note:  01/29/2023 4:16 PM  Bryan Brock  has presented today for surgery, with the diagnosis of sternal wound.  The various methods of treatment have been discussed with the patient and family. After consideration of risks, benefits and other options for treatment, the patient has consented to  Procedure(s): Sternal wound irrigation and debridement, placement of myriad and wound VAC change (N/A) APPLICATION OF SKIN SUBSTITUTE (N/A) APPLICATION OF WOUND VAC (N/A) as a surgical intervention.  The patient's history has been reviewed, patient examined, no change in status, stable for surgery.  I have reviewed the patient's chart and labs.  Questions were answered to the patient's satisfaction.     Alena Bills Anush Wiedeman

## 2023-01-29 NOTE — Progress Notes (Signed)
Inpatient Rehab Admissions Coordinator:   CIR following. Pt. To OR today. He worked with PT this morning and walked 400 ft with min guard assist. If Pt. Does this well post-op, he may not meet criteria for CIR admit; however, attending service is concerned that Pt. Has experienced functional decline after trips to the OR, so I will see how he does functionally post-op before signing off.   Megan Salon, MS, CCC-SLP Rehab Admissions Coordinator  [phone] (celll) [phone] (office)

## 2023-01-29 NOTE — Progress Notes (Addendum)
[address]       [place] [address]             [phone]      6 Days Post-Op Procedure(s) (LRB): Excision of sternal wound with Myriad (N/A) APPLICATION OF WOUND VAC (N/A) Subjective: Pt states he had some coughing last night but otherwise feels good and is ready for his procedure today.  Objective: Vital signs in last 24 hours: Temp:  [97.5 F (36.4 C)-99.7 F (37.6 C)] 99.7 F (37.6 C) (06/05 0400) Pulse Rate:  [73-93] 73 (06/05 0400) Cardiac Rhythm: Normal sinus rhythm (06/05 0001) Resp:  [14-27] 16 (06/05 0400) BP: (110-144)/(57-125) 142/67 (06/05 0400) SpO2:  [70 %-100 %] 100 % (06/05 0400) Weight:  [140.3 kg] 140.3 kg (06/05 0500)  Hemodynamic parameters for last 24 hours:    Intake/Output from previous day: 06/04 0701 - 06/05 0700 In: 240 [P.O.:240] Out: 1525 [Urine:1500; Drains:25] Intake/Output this shift: No intake/output data recorded.  General appearance: alert, cooperative, and no distress Neurologic: intact Heart: regular rate and rhythm, no murmur, PVCs Lungs: clear to auscultation bilaterally Abdomen: soft, non-tender; bowel sounds normal; no masses,  no organomegaly Extremities: edema 1+ Wound: Wound VAC with some serous drainage at inferior portion, small leak but wound VAC is still suctioning at . Canister with 200cc of serosanguinous fluid. Ecchymosis on BLE improving, EVH incisions are clean and dry  Lab Results: No results for input(s): "WBC", "HGB", "HCT", "PLT" in the last 72 hours. BMET:  Recent Labs    01/27/23 0200 01/28/23 0243  NA 133* 134*  K 3.5 4.0  CL 91* 95*  CO2 30 30  GLUCOSE 107* 111*  BUN 21 20  CREATININE 1.11 1.16  CALCIUM 8.5* 8.4*    PT/INR: No results for input(s): "LABPROT", "INR" in the last 72 hours. ABG    Component Value Date/Time   PHART 7.399 01/08/2023 0830   HCO3 28.2 (H) 01/08/2023 0830   TCO2 30 01/08/2023 0830   ACIDBASEDEF 1.0 01/07/2023 1936   O2SAT 97  01/08/2023 0830   CBG (last 3)  Recent Labs    01/28/23 1713 01/28/23 2107 01/29/23 0555  GLUCAP 103* 78 127*    Assessment/Plan: S/P Procedure(s) (LRB): Excision of sternal wound with Myriad (N/A) APPLICATION OF WOUND VAC (N/A)  CV: NSR, HR 80s. SBP 110-142. Hx of afib. Continue Amiodarone 200mg  daily and Lopressor 37.5mg  BID, will monitor for possible titration of BB.   Pulm: Saturating 90-100% on RA, saturates 90-93% with ambulation. Pt was coughing a lot last night and was placed back on 1L O2, saturating well. Encourage IS, flutter valve and ambulation.   GI: Good appetite, no nausea. +BM yesterday   Endo: CBGs controlled on SSI. New diabetes diagnosis A1C 6.6, outpatient appt with PCP has been arranged.   Renal: Last Cr 1.16. UO 1500cc/24hrs.   Hypokalemia: Resolved, K 4.0   ID: Leukocytosis resolved. Tmax 99.7. On Empiric Keflex. Will monitor.   Hyponatremia: Mild and improved, Na 134. Will monitor.   Sternal dehiscence: Wound VAC changed by plastic surgery team yesterday. Small leak due to serous fluid drainage this morning but wound VAC working well with good suction. Discussed with RN to reinforce drape edges until he is taken to the OR. Plastics to take back to OR today for wound VAC change and debridement.   Deconditioning: CIR evaluated, currently a candidate and they will continue to follow him. PT recommended CIR. Continue work with PT/OT.   Dispo:  Plastic surgery to take pt to OR today. Hopefully if he remains a candidate for CIR can d/c by the end of the week. If not may have to look into SNF since he will not have full time help at home.   LOS: 27 days    Jenny Reichmann, PA-C 01/29/2023   Chart reviewed, patient examined, agree with above.

## 2023-01-30 ENCOUNTER — Inpatient Hospital Stay (HOSPITAL_COMMUNITY): Payer: Medicare Other

## 2023-01-30 ENCOUNTER — Ambulatory Visit: Payer: Medicare Other | Admitting: Family Medicine

## 2023-01-30 ENCOUNTER — Encounter (HOSPITAL_COMMUNITY): Payer: Self-pay | Admitting: Plastic Surgery

## 2023-01-30 LAB — BASIC METABOLIC PANEL
Anion gap: 8 (ref 5–15)
BUN: 21 mg/dL (ref 8–23)
CO2: 25 mmol/L (ref 22–32)
Calcium: 8.4 mg/dL — ABNORMAL LOW (ref 8.9–10.3)
Chloride: 100 mmol/L (ref 98–111)
Creatinine, Ser: 1.34 mg/dL — ABNORMAL HIGH (ref 0.61–1.24)
GFR, Estimated: 58 mL/min — ABNORMAL LOW (ref >60)
Glucose, Bld: 221 mg/dL — ABNORMAL HIGH (ref 70–99)
Potassium: 5.2 mmol/L — ABNORMAL HIGH (ref 3.5–5.1)
Sodium: 133 mmol/L — ABNORMAL LOW (ref 135–145)

## 2023-01-30 LAB — CBC
HCT: 32.2 % — ABNORMAL LOW (ref 39.0–52.0)
Hemoglobin: 9.7 g/dL — ABNORMAL LOW (ref 13.0–17.0)
MCH: 29.4 pg (ref 26.0–34.0)
MCHC: 30.1 g/dL (ref 30.0–36.0)
MCV: 97.6 fL (ref 80.0–100.0)
Platelets: 309 10*3/uL (ref 150–400)
RBC: 3.3 MIL/uL — ABNORMAL LOW (ref 4.22–5.81)
RDW: 14.5 % (ref 11.5–15.5)
WBC: 4.9 10*3/uL (ref 4.0–10.5)
nRBC: 0 % (ref 0.0–0.2)

## 2023-01-30 LAB — GLUCOSE, CAPILLARY
Glucose-Capillary: 144 mg/dL — ABNORMAL HIGH (ref 70–99)
Glucose-Capillary: 136 mg/dL — ABNORMAL HIGH (ref 70–99)
Glucose-Capillary: 126 mg/dL — ABNORMAL HIGH (ref 70–99)
Glucose-Capillary: 124 mg/dL — ABNORMAL HIGH (ref 70–99)

## 2023-01-30 MED ORDER — CHLORHEXIDINE GLUCONATE CLOTH 2 % EX PADS
6.0000 | MEDICATED_PAD | Freq: Once | CUTANEOUS | Status: AC
  Administered 2023-01-31: 6 via TOPICAL

## 2023-01-30 MED ORDER — CHLORHEXIDINE GLUCONATE CLOTH 2 % EX PADS
6.0000 | MEDICATED_PAD | Freq: Once | CUTANEOUS | Status: AC
  Administered 2023-01-30: 6 via TOPICAL

## 2023-01-30 MED ORDER — CEFAZOLIN IN SODIUM CHLORIDE 3-0.9 GM/100ML-% IV SOLN
3.0000 g | INTRAVENOUS | Status: DC
  Filled 2023-01-30: qty 100

## 2023-01-30 NOTE — Progress Notes (Addendum)
[address]       [place] [address]             [phone]      1 Day Post-Op Procedure(s) (LRB): Sternal wound irrigation and debridement, placement of myriad and wound VAC change (N/A) APPLICATION OF SKIN SUBSTITUTE (N/A) APPLICATION OF WOUND VAC (N/A) Subjective: Pt with no new complaints this AM  Objective: Vital signs in last 24 hours: Temp:  [97.6 F (36.4 C)-98.6 F (37 C)] 98.4 F (36.9 C) (06/06 0337) Pulse Rate:  [69-89] 69 (06/06 0337) Cardiac Rhythm: Normal sinus rhythm (06/05 2038) Resp:  [15-22] 20 (06/06 0337) BP: (106-139)/(51-88) 137/74 (06/06 0337) SpO2:  [91 %-98 %] 96 % (06/06 0337) FiO2 (%):  [21 %] 21 % (06/05 0914) Weight:  [139.7 kg] 139.7 kg (06/06 0557)  Hemodynamic parameters for last 24 hours:    Intake/Output from previous day: 06/05 0701 - 06/06 0700 In: 1100 [P.O.:400; I.V.:700] Out: 2200 [Urine:2100; Drains:100] Intake/Output this shift: No intake/output data recorded.  General appearance: alert, cooperative, and no distress Neurologic: intact Heart: regular rate and rhythm, S1, S2 normal, no murmur, click, rub or gallop Lungs: Diminished bibasilar Abdomen: soft, non-tender; bowel sounds normal; no masses,  no organomegaly Extremities: edema 1+, ecchymosis improving Wound: Wound VAC with good suction, 200cc serosanguinous output, EVH sites are clean and dry  Lab Results: Recent Labs    01/30/23 0243  WBC 4.9  HGB 9.7*  HCT 32.2*  PLT 309   BMET:  Recent Labs    01/28/23 0243 01/30/23 0243  NA 134* 133*  K 4.0 5.2*  CL 95* 100  CO2 30 25  GLUCOSE 111* 221*  BUN 20 21  CREATININE 1.16 1.34*  CALCIUM 8.4* 8.4*    PT/INR: No results for input(s): "LABPROT", "INR" in the last 72 hours. ABG    Component Value Date/Time   PHART 7.399 01/08/2023 0830   HCO3 28.2 (H) 01/08/2023 0830   TCO2 30 01/08/2023 0830   ACIDBASEDEF 1.0 01/07/2023 1936   O2SAT 97 01/08/2023 0830   CBG (last 3)   Recent Labs    01/29/23 1524 01/29/23 2102 01/30/23 0601  GLUCAP 93 190* 144*    Assessment/Plan: S/P Procedure(s) (LRB): Sternal wound irrigation and debridement, placement of myriad and wound VAC change (N/A) APPLICATION OF SKIN SUBSTITUTE (N/A) APPLICATION OF WOUND VAC (N/A)  CV: NSR, HR 70s. SBP 106-137. Hx of afib. Continue Amiodarone 200mg  daily and Lopressor 37.5mg  BID.   Pulm: CXR with vascular congestion and bibasilar atelectasis, small left pleural effusion. Saturating 96% on RA. Encourage IS, flutter valve and ambulation.    GI: Good appetite, no nausea. +BM yesterday   Endo: CBGs 93/190/144 on SSI, will monitor. New diabetes diagnosis A1C 6.6, outpatient appt with PCP has been arranged.   Renal: Cr 1.34 this AM. UO 2100cc/24hrs. Likely due to NPO yesterday, will monitor.   Hypokalemia: Now hyperkalemic, likely due to supplement. K 5.2, supplement d/c'd, will monitor.  ID: No sign of infection at wound. Leukocytosis resolved, afebrile. Keflex will be completed today.   Hyponatremia: Mild, Na 133. Will monitor.  Expected postop ABLA: H/H stable 9.7/32.2   Sternal dehiscence: OR yesterday by plastics for debridement, Myriad and new VAC placement. Wound Vac with 200cc of serosanguinous output. Possibly to OR tomorrow with plastic surgery for pec flap.    Deconditioning: CIR evaluated, currently a candidate and they will continue to follow him. PT recommended CIR. Continue work with PT/OT.  Dispo: Possibly back to OR tomorrow for pec flap by plastic surgery. Then hopefully to CIR next week once recovered and if he remains a candidate.   LOS: 28 days    Jenny Reichmann, PA-C 01/30/2023   Chart reviewed, patient examined, agree with above. He feels ok after wound debridement and VAC change yesterday.  Continue mobilization and await PRS plans for possible pectoralis muscle flap closure.

## 2023-01-30 NOTE — Anesthesia Postprocedure Evaluation (Signed)
Anesthesia Post Note  Patient: Bryan Brock  Procedure(s) Performed: Sternal wound irrigation and debridement, placement of myriad and wound VAC change (Chest) APPLICATION OF SKIN SUBSTITUTE (Chest) APPLICATION OF WOUND VAC (Chest)     Patient location during evaluation: PACU Anesthesia Type: General Level of consciousness: sedated and patient cooperative Pain management: pain level controlled Vital Signs Assessment: post-procedure vital signs reviewed and stable Respiratory status: spontaneous breathing Cardiovascular status: stable Anesthetic complications: no   No notable events documented.  Last Vitals:  Vitals:   01/30/23 1929 01/30/23 2105  BP: 123/65   Pulse: 78 75  Resp: 20   Temp: 36.7 C   SpO2: 100% 98%    Last Pain:  Vitals:   01/30/23 1929  TempSrc: Oral  PainSc:                  [name]

## 2023-01-30 NOTE — H&P (View-Only) (Signed)
1 Day Post-Op  Subjective:  Post op day # 7 SP wound excision, intermediate closure and placement of wound vac  Doing well today without complaints  Wound vac with good suction   Objective: Vital signs in last 24 hours: Temp:  [97.6 F (36.4 C)-98.6 F (37 C)] 98.2 F (36.8 C) (06/06 1116) Pulse Rate:  [69-89] 69 (06/06 0337) Resp:  [20-22] 20 (06/06 1116) BP: (106-139)/(61-88) 107/62 (06/06 1116) SpO2:  [91 %-98 %] 97 % (06/06 0855) FiO2 (%):  [21 %] 21 % (06/06 0855) Weight:  [139.7 kg] 139.7 kg (06/06 0557) Last BM Date : 01/29/23   Physical Exam:  General: Pt resting comfortably in no acute distress Sternal wound with wound vac in place, good suction. No surrounding redness. Serosanguinous output in vac canister    Assessment/Plan: s/p Procedure(s): Sternal wound irrigation and debridement, placement of myriad and wound VAC change APPLICATION OF SKIN SUBSTITUTE APPLICATION OF WOUND VAC   SP wound excision, closure, and placement of wound vac  Given the large defect Dr. Dillingham would recommend proceeding with right pectoralis flap. We plan to take him to the OR tomorrow. He is in agreement with the plan. All questions were addresses. NPO after midnight.    Bryan Brock Bryan Wasil Wolke, PA-C 01/30/2023  

## 2023-01-30 NOTE — Progress Notes (Signed)
1 Day Post-Op  Subjective:  Post op day # 7 SP wound excision, intermediate closure and placement of wound vac  Doing well today without complaints  Wound vac with good suction   Objective: Vital signs in last 24 hours: Temp:  [97.6 F (36.4 C)-98.6 F (37 C)] 98.2 F (36.8 C) (06/06 1116) Pulse Rate:  [69-89] 69 (06/06 0337) Resp:  [20-22] 20 (06/06 1116) BP: (106-139)/(61-88) 107/62 (06/06 1116) SpO2:  [91 %-98 %] 97 % (06/06 0855) FiO2 (%):  [21 %] 21 % (06/06 0855) Weight:  [139.7 kg] 139.7 kg (06/06 0557) Last BM Date : 01/29/23   Physical Exam:  General: Pt resting comfortably in no acute distress Sternal wound with wound vac in place, good suction. No surrounding redness. Serosanguinous output in vac canister    Assessment/Plan: s/p Procedure(s): Sternal wound irrigation and debridement, placement of myriad and wound VAC change APPLICATION OF SKIN SUBSTITUTE APPLICATION OF WOUND VAC   SP wound excision, closure, and placement of wound vac  Given the large defect Dr. Ulice Bold would recommend proceeding with right pectoralis flap. We plan to take him to the OR tomorrow. He is in agreement with the plan. All questions were addresses. NPO after midnight.    Kelle Darting Mavryk Pino, PA-C 01/30/2023

## 2023-01-30 NOTE — Progress Notes (Signed)
Occupational Therapy Treatment Patient Details Name: Bryan Brock MRN: [id number] DOB: [birth year] Today's Date: 01/30/2023   History of present illness     OT comments  Patient seen by OT after patient completed PT treatment. Patient stated he felt very fatigued from PT treatment and was limited on physical activity he could perform for OT. Patient agreeable to address grooming and discussion of adaptive equipment and other needs for when he returns home. Patient declined attempting adaptive equipment use stating he did not have strength. Patient will benefit from intensive inpatient follow up therapy, >3 hours/day to address bathing, dressing, and toilet transfers. Acute OT to continue to follow.    Recommendations for follow up therapy are one component of a multi-disciplinary discharge planning process, led by the attending physician.  Recommendations may be updated based on patient status, additional functional criteria and insurance authorization.    Assistance Recommended at Discharge Intermittent Supervision/Assistance  Patient can return home with the following  Assist for transportation;Assistance with cooking/housework;A lot of help with bathing/dressing/bathroom;A little help with walking and/or transfers   Equipment Recommendations  None recommended by OT    Recommendations for Other Services      Precautions / Restrictions         Mobility Bed Mobility                    Transfers                         Balance                                           ADL either performed or assessed with clinical judgement   ADL                                              Extremity/Trunk Assessment              Vision       Perception     Praxis      Cognition                                                Exercises      Shoulder Instructions       General Comments       Pertinent Vitals/ Pain          Home Living                                          Prior Functioning/Environment              Frequency  Min 1X/week        Progress Toward Goals  OT Goals(current goals can now be found in the care plan section)  Progress towards OT goals: Progressing toward goals  Acute Rehab OT Goals Patient Stated Goal: get stronger OT Goal Formulation: With patient Time For Goal Achievement: 02/07/23 Potential to Achieve Goals: Good ADL Goals Pt Will Perform Grooming:  with supervision;standing Pt Will Perform Upper Body Dressing: with supervision;sitting Pt Will Perform Lower Body Dressing: with min assist;sit to/from stand Pt Will Transfer to Toilet: with supervision;ambulating;regular height toilet Pt/caregiver will Perform Home Exercise Program: Increased ROM;Both right and left upper extremity  Plan Discharge plan remains appropriate    Co-evaluation                 AM-PAC OT "6 Clicks" Daily Activity     Outcome Measure   Help from another person eating meals?: None Help from another person taking care of personal grooming?: A Little Help from another person toileting, which includes using toliet, bedpan, or urinal?: A Lot Help from another person bathing (including washing, rinsing, drying)?: A Lot Help from another person to put on and taking off regular upper body clothing?: A Little Help from another person to put on and taking off regular lower body clothing?: A Lot 6 Click Score: 16    End of Session    OT Visit Diagnosis: Unsteadiness on feet (R26.81);Muscle weakness (generalized) (M62.81)   Activity Tolerance Patient limited by fatigue   Patient Left with call bell/phone within reach;in chair   Nurse Communication Mobility status        Time: 4782-9562 OT Time Calculation (min): 23 min  Charges: OT General Charges $OT Visit: 1 Visit OT Treatments $Self Care/Home Management : 23-37  mins  Alfonse Flavors, OTA Acute Rehabilitation Services  Office [phone]   Dewain Penning 01/30/2023, 1:27 PM

## 2023-01-30 NOTE — Progress Notes (Signed)
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 5/14 sternal rewiring due to dehiscence coughing. 5/27 sternal wound I&D with VAC placement. 6/5 s/p Sternal wound irrigation and debridement, placement of myriad and wound VAC change. Plan for possible return to OR 6/7 for R pec flap with plastic surgery. PMhx: HTN, HLD, bipolar d/o, obesity, sleep apnea, mitral valve prolapse, GERD.    PT Comments    Pt received in supine, agreeable to therapy session after initial reluctance due to c/o increased fatigue after recent procedure. After discussion on benefits of mobility/risks of immobility and need for continued progression within tolerance, pt agreeable, self-limiting gait distance to shorter household distance due to pain, chair follow provided for his safety. Pt without episodes of buckling or lightheadedness, but reports moderate to severe fatigue and R knee/incisional pain as main limitations for him this date. SpO2/BP WFL when checked post-exertion (poor signal on pulse oximeter during trial but DOE not more than 2/4 and pt defers seated break). Pt continues to benefit from PT services to progress toward functional mobility goals, continue to recommend moderate to higher intensity post-acute rehab, pt participatory as able and puts forth effort even on days where post-op fatigue/pain is increased.   Recommendations for follow up therapy are one component of a multi-disciplinary discharge planning process, led by the attending physician.  Recommendations may be updated based on patient status, additional functional criteria and insurance authorization.  Follow Up Recommendations       Assistance Recommended at Discharge Intermittent Supervision/Assistance  Patient can return home with the following A little help with walking and/or transfers;A little help with  bathing/dressing/bathroom;Assistance with cooking/housework;Assist for transportation;Help with stairs or ramp for entrance   Equipment Recommendations  Rolling walker (2 wheels) (pt requesting regular width RW in addition to bariatric width one already delivered, he is OK to pay out of pocket for it.)    Recommendations for Other Services       Precautions / Restrictions Precautions Precautions: Sternal;Fall;Other (comment) Precaution Booklet Issued: No (handout previously delivered) Precaution Comments: Watch SpO2, sternal wound vac Restrictions Weight Bearing Restrictions: No Other Position/Activity Restrictions: sternal precs; pt has chest binder in place around sternal incision.     Mobility  Bed Mobility Overal bed mobility: Needs Assistance Bed Mobility: Rolling, Sidelying to Sit Rolling: Supervision Sidelying to sit: Min assist       General bed mobility comments: increased time, x1 cues given for sternal precs, no cues needed for rolling with good initiation    Transfers Overall transfer level: Needs assistance Equipment used: Rolling walker (2 wheels) (bariatric) Transfers: Sit to/from Stand, Bed to chair/wheelchair/BSC Sit to Stand: Min assist   Step pivot transfers: Min guard       General transfer comment: Pt using momentum to power up but needs minA steadying/lift from lower bed height as he is more fatigued this date. Consistent cues needed for pushing from his knees with elbows tucked in prior to standing/sitting.    Ambulation/Gait Ambulation/Gait assistance: Min guard, +2 safety/equipment Gait Distance (Feet): 100 Feet Assistive device: Rolling walker (2 wheels) Gait Pattern/deviations: Step-through pattern, Decreased stride length, Trunk flexed Gait velocity: decreased     General Gait Details: Cues for posture, precs and proximity to RW. Multiple standing rest breaks. 1/4 DOE. HR WFL SpO2 99% on RA when signal achieved (noisy signal while pt  using RW). +2  only for chair follow for safety, no overt buckling or LOB. Pt self-limiting gait distance due to c/o fatigue after procedure yesterday.   Stairs         General stair comments:  (pt defers today due to increased fatigue after gait trial)   Wheelchair Mobility    Modified Rankin (Stroke Patients Only)       Balance Overall balance assessment: Needs assistance Sitting-balance support: No upper extremity supported, Feet supported Sitting balance-Leahy Scale: Good Sitting balance - Comments: EOB without support   Standing balance support: Bilateral upper extremity supported, During functional activity, Reliant on assistive device for balance Standing balance-Leahy Scale: Fair Standing balance comment: can stand without UB support, RW for gait                            Cognition Arousal/Alertness: Awake/alert Behavior During Therapy: WFL for tasks assessed/performed Overall Cognitive Status: Within Functional Limits for tasks assessed                                 General Comments: slow pacing, hyperverbose; Reluctantly agreeable to participate with encouragement and discussion on benefits of therapy.        Exercises General Exercises - Lower Extremity Ankle Circles/Pumps: AROM, Both, 20 reps, Supine, Seated (x10 reps ea posture) Long Arc Quad: AROM, Both, Seated, 10 reps Heel Slides: AROM, Both, 10 reps, Supine Hip Flexion/Marching: AROM, Both, Seated, 10 reps    General Comments General comments (skin integrity, edema, etc.): HR 77 bpm; SpO2 97% on RA with exertion when good signal obtained; BP 129/84 post-exertion in chair with feet on floor.      Pertinent Vitals/Pain Pain Assessment Pain Assessment: Faces Faces Pain Scale: Hurts even more Pain Location: R knee, sternal incision, generalized Pain Descriptors / Indicators: Grimacing, Discomfort, Guarding Pain Intervention(s): Monitored during session, Limited activity  within patient's tolerance, Repositioned (pt defers ice for chronic R knee pain)     PT Goals (current goals can now be found in the care plan section) Acute Rehab PT Goals Patient Stated Goal: rehab so I can get stronger then home and back to work PT Goal Formulation: With patient Time For Goal Achievement: 02/07/23 Progress towards PT goals: Progressing toward goals    Frequency    Min 1X/week      PT Plan Current plan remains appropriate    Co-evaluation              AM-PAC PT "6 Clicks" Mobility   Outcome Measure  Help needed turning from your back to your side while in a flat bed without using bedrails?: A Little Help needed moving from lying on your back to sitting on the side of a flat bed without using bedrails?: A Lot (flat bed/no rails) Help needed moving to and from a bed to a chair (including a wheelchair)?: A Little Help needed standing up from a chair using your arms (e.g., wheelchair or bedside chair)?: A Little Help needed to walk in hospital room?: A Little Help needed climbing 3-5 steps with a railing? : A Lot (based on today's performance) 6 Click Score: 16    End of Session Equipment Utilized During Treatment: Gait belt Activity Tolerance: Patient tolerated treatment well;Patient limited by fatigue Patient left: in chair;with call bell/phone within reach;Other (comment) (in care of OT, did not turn chair alarm on yet due to pt about  to work with OT) Nurse Communication: Mobility status PT Visit Diagnosis: Other abnormalities of gait and mobility (R26.89);Difficulty in walking, not elsewhere classified (R26.2)     Time: 4782-9562 PT Time Calculation (min) (ACUTE ONLY): 24 min  Charges:  $Gait Training: 8-22 mins $Therapeutic Exercise: 8-22 mins                     Kayen Grabel P., PTA Acute Rehabilitation Services Secure Chat Preferred 9a-5:30pm Office: [phone]    Dorathy Kinsman [facility] 01/30/2023, 2:51 PM

## 2023-01-30 NOTE — Progress Notes (Signed)
CARDIAC REHAB PHASE I   Pt refused walk in hall,after max encouragement, citing fatigue for procedure yesterday. Assisted back to bed, reports he is "wiped out" from sitting in chair the past 3 hours. Encouraged to ambulate later today, IS use and oob to chair for lunch. Will continue to follow.    [id number] Woodroe Chen, RN BSN 01/30/2023 10:49 AM

## 2023-01-31 ENCOUNTER — Inpatient Hospital Stay (HOSPITAL_COMMUNITY): Payer: Medicare Other | Admitting: Certified Registered"

## 2023-01-31 ENCOUNTER — Encounter (HOSPITAL_COMMUNITY)
Admission: RE | Disposition: A | Discharge status: Still In Hospital | Payer: Self-pay | Source: Ambulatory Visit | Attending: Surgery

## 2023-01-31 ENCOUNTER — Encounter (HOSPITAL_COMMUNITY): Payer: Self-pay | Admitting: Surgery

## 2023-01-31 DIAGNOSIS — I2510 Atherosclerotic heart disease of native coronary artery without angina pectoris: Secondary | ICD-10-CM

## 2023-01-31 DIAGNOSIS — I341 Nonrheumatic mitral (valve) prolapse: Secondary | ICD-10-CM

## 2023-01-31 DIAGNOSIS — I119 Hypertensive heart disease without heart failure: Secondary | ICD-10-CM

## 2023-01-31 DIAGNOSIS — T8149XA Infection following a procedure, other surgical site, initial encounter: Secondary | ICD-10-CM | POA: Diagnosis not present

## 2023-01-31 DIAGNOSIS — J45909 Unspecified asthma, uncomplicated: Secondary | ICD-10-CM

## 2023-01-31 DIAGNOSIS — Z951 Presence of aortocoronary bypass graft: Secondary | ICD-10-CM

## 2023-01-31 DIAGNOSIS — T8131XA Disruption of external operation (surgical) wound, not elsewhere classified, initial encounter: Secondary | ICD-10-CM | POA: Diagnosis not present

## 2023-01-31 HISTORY — PX: INCISION AND DRAINAGE OF WOUND: SHX1803

## 2023-01-31 HISTORY — PX: PECTORALIS FLAP: SHX6228

## 2023-01-31 LAB — GLUCOSE, CAPILLARY
Glucose-Capillary: 90 mg/dL (ref 70–99)
Glucose-Capillary: 85 mg/dL (ref 70–99)
Glucose-Capillary: 97 mg/dL (ref 70–99)
Glucose-Capillary: 91 mg/dL (ref 70–99)

## 2023-01-31 LAB — BASIC METABOLIC PANEL
Anion gap: 7 (ref 5–15)
BUN: 29 mg/dL — ABNORMAL HIGH (ref 8–23)
CO2: 25 mmol/L (ref 22–32)
Calcium: 8.1 mg/dL — ABNORMAL LOW (ref 8.9–10.3)
Chloride: 101 mmol/L (ref 98–111)
Creatinine, Ser: 1.34 mg/dL — ABNORMAL HIGH (ref 0.61–1.24)
GFR, Estimated: 58 mL/min — ABNORMAL LOW (ref >60)
Glucose, Bld: 157 mg/dL — ABNORMAL HIGH (ref 70–99)
Potassium: 4.6 mmol/L (ref 3.5–5.1)
Sodium: 133 mmol/L — ABNORMAL LOW (ref 135–145)

## 2023-01-31 SURGERY — ADVANCEMENT, FLAP, PECTORALIS
Anesthesia: General | Site: Chest | Laterality: Right

## 2023-01-31 MED ORDER — CHLORHEXIDINE GLUCONATE 0.12 % MT SOLN: 15.0000 mL | Freq: Once | OROMUCOSAL | Status: DC

## 2023-01-31 MED ORDER — LIDOCAINE 2% (20 MG/ML) 5 ML SYRINGE
INTRAMUSCULAR | Status: AC
  Filled 2023-01-31: qty 20

## 2023-01-31 MED ORDER — MIDAZOLAM HCL 2 MG/2ML IJ SOLN
INTRAMUSCULAR | Status: DC | PRN
  Administered 2023-01-31 (×2): 1 mg via INTRAVENOUS

## 2023-01-31 MED ORDER — ATROPINE SULFATE 0.4 MG/ML IV SOLN
INTRAVENOUS | Status: AC
  Filled 2023-01-31: qty 1

## 2023-01-31 MED ORDER — DEXMEDETOMIDINE HCL IN NACL 80 MCG/20ML IV SOLN
INTRAVENOUS | Status: AC
  Filled 2023-01-31: qty 20

## 2023-01-31 MED ORDER — HEMOSTATIC AGENTS (NO CHARGE) OPTIME
TOPICAL | Status: DC | PRN
  Administered 2023-01-31 (×3): 1 via TOPICAL

## 2023-01-31 MED ORDER — MIDAZOLAM HCL 2 MG/2ML IJ SOLN
INTRAMUSCULAR | Status: AC
  Filled 2023-01-31: qty 2

## 2023-01-31 MED ORDER — PROPOFOL 10 MG/ML IV BOLUS
INTRAVENOUS | Status: AC
  Filled 2023-01-31: qty 20

## 2023-01-31 MED ORDER — SUGAMMADEX SODIUM 200 MG/2ML IV SOLN
INTRAVENOUS | Status: DC | PRN
  Administered 2023-01-31: 200 mg via INTRAVENOUS
  Administered 2023-01-31: 100 mg via INTRAVENOUS

## 2023-01-31 MED ORDER — ONDANSETRON HCL 4 MG/2ML IJ SOLN
INTRAMUSCULAR | Status: AC
  Filled 2023-01-31: qty 8

## 2023-01-31 MED ORDER — CHLORHEXIDINE GLUCONATE 0.12 % MT SOLN
OROMUCOSAL | Status: AC
  Filled 2023-01-31: qty 15

## 2023-01-31 MED ORDER — ROCURONIUM BROMIDE 10 MG/ML (PF) SYRINGE
PREFILLED_SYRINGE | INTRAVENOUS | Status: DC | PRN
  Administered 2023-01-31: 40 mg via INTRAVENOUS
  Administered 2023-01-31: 60 mg via INTRAVENOUS

## 2023-01-31 MED ORDER — DEXAMETHASONE SODIUM PHOSPHATE 10 MG/ML IJ SOLN
INTRAMUSCULAR | Status: AC
  Filled 2023-01-31: qty 3

## 2023-01-31 MED ORDER — CEFAZOLIN IN SODIUM CHLORIDE 3-0.9 GM/100ML-% IV SOLN
3.0000 g | INTRAVENOUS | Status: AC
  Administered 2023-01-31: 2 g via INTRAVENOUS

## 2023-01-31 MED ORDER — FENTANYL CITRATE (PF) 250 MCG/5ML IJ SOLN
INTRAMUSCULAR | Status: AC
  Filled 2023-01-31: qty 5

## 2023-01-31 MED ORDER — ONDANSETRON HCL 4 MG/2ML IJ SOLN
INTRAMUSCULAR | Status: DC | PRN
  Administered 2023-01-31: 4 mg via INTRAVENOUS

## 2023-01-31 MED ORDER — PHENYLEPHRINE HCL-NACL 20-0.9 MG/250ML-% IV SOLN
INTRAVENOUS | Status: DC | PRN
  Administered 2023-01-31: 25 ug/min via INTRAVENOUS

## 2023-01-31 MED ORDER — EPHEDRINE SULFATE-NACL 50-0.9 MG/10ML-% IV SOSY
PREFILLED_SYRINGE | INTRAVENOUS | Status: DC | PRN
  Administered 2023-01-31 (×2): 5 mg via INTRAVENOUS

## 2023-01-31 MED ORDER — LACTATED RINGERS IV SOLN: INTRAVENOUS | Status: DC

## 2023-01-31 MED ORDER — BUPIVACAINE LIPOSOME 1.3 % IJ SUSP
INTRAMUSCULAR | Status: DC | PRN
  Administered 2023-01-31: 20 mL

## 2023-01-31 MED ORDER — CEFAZOLIN SODIUM 1 G IJ SOLR
INTRAMUSCULAR | Status: AC
  Filled 2023-01-31: qty 30

## 2023-01-31 MED ORDER — BUPIVACAINE LIPOSOME 1.3 % IJ SUSP
INTRAMUSCULAR | Status: AC
  Filled 2023-01-31: qty 20

## 2023-01-31 MED ORDER — ROCURONIUM BROMIDE 10 MG/ML (PF) SYRINGE
PREFILLED_SYRINGE | INTRAVENOUS | Status: AC
  Filled 2023-01-31: qty 20

## 2023-01-31 MED ORDER — PROPOFOL 10 MG/ML IV BOLUS
INTRAVENOUS | Status: DC | PRN
  Administered 2023-01-31: 80 mg via INTRAVENOUS
  Administered 2023-01-31: 120 mg via INTRAVENOUS

## 2023-01-31 MED ORDER — FENTANYL CITRATE (PF) 100 MCG/2ML IJ SOLN: 25.0000 ug | INTRAMUSCULAR | Status: DC | PRN

## 2023-01-31 MED ORDER — ORAL CARE MOUTH RINSE: 15.0000 mL | Freq: Once | OROMUCOSAL | Status: DC

## 2023-01-31 MED ORDER — FENTANYL CITRATE (PF) 250 MCG/5ML IJ SOLN
INTRAMUSCULAR | Status: DC | PRN
  Administered 2023-01-31 (×3): 50 ug via INTRAVENOUS

## 2023-01-31 MED ORDER — ACETAMINOPHEN 10 MG/ML IV SOLN: 1000.0000 mg | Freq: Once | INTRAVENOUS | Status: DC | PRN

## 2023-01-31 MED ORDER — 0.9 % SODIUM CHLORIDE (POUR BTL) OPTIME
TOPICAL | Status: DC | PRN
  Administered 2023-01-31 (×2): 1000 mL

## 2023-01-31 MED ORDER — LIDOCAINE 2% (20 MG/ML) 5 ML SYRINGE
INTRAMUSCULAR | Status: DC | PRN
  Administered 2023-01-31: 40 mg via INTRAVENOUS

## 2023-01-31 MED ORDER — EPHEDRINE 5 MG/ML INJ
INTRAVENOUS | Status: AC
  Filled 2023-01-31: qty 10

## 2023-01-31 MED ORDER — LIDOCAINE-EPINEPHRINE 1 %-1:100000 IJ SOLN
INTRAMUSCULAR | Status: AC
  Filled 2023-01-31: qty 1

## 2023-01-31 SURGICAL SUPPLY — 102 items
AGENT HMST PWDR BTL CLGN 5GM (Miscellaneous) ×2 IMPLANT
APL PRP STRL LF DISP 70% ISPRP (MISCELLANEOUS) ×2
APL SKNCLS STERI-STRIP NONHPOA (GAUZE/BANDAGES/DRESSINGS) ×2
APL SWBSTK 6 STRL LF DISP (MISCELLANEOUS)
APPLICATOR COTTON TIP 6 STRL (MISCELLANEOUS) IMPLANT
APPLICATOR COTTON TIP 6IN STRL (MISCELLANEOUS) IMPLANT
APPLIER CLIP 11 MED OPEN (CLIP) ×2
APPLIER CLIP 9.375 MED OPEN (MISCELLANEOUS)
APR CLP MED 11 20 MLT OPN (CLIP) ×2
APR CLP MED 9.3 20 MLT OPN (MISCELLANEOUS)
ATCH SMKEVC FLXB CAUT HNDSWH (FILTER) ×2 IMPLANT
BAG COUNTER SPONGE SURGICOUNT (BAG) ×2 IMPLANT
BAG DECANTER FOR FLEXI CONT (MISCELLANEOUS) ×2 IMPLANT
BAG SPNG CNTER NS LX DISP (BAG) ×2
BENZOIN TINCTURE PRP APPL 2/3 (GAUZE/BANDAGES/DRESSINGS) ×2 IMPLANT
BIOPATCH RED 1 DISK 7.0 (GAUZE/BANDAGES/DRESSINGS) ×2 IMPLANT
BLADE SURG 15 STRL LF DISP TIS (BLADE) ×2 IMPLANT
BLADE SURG 15 STRL SS (BLADE) ×2
CANISTER SUCT 3000ML PPV (MISCELLANEOUS) ×2 IMPLANT
CANISTER WOUND CARE 500ML ATS (WOUND CARE) IMPLANT
CHLORAPREP W/TINT 26 (MISCELLANEOUS) ×2 IMPLANT
CLEANSER WND VASHE 34 (WOUND CARE) IMPLANT
CLIP APPLIE 11 MED OPEN (CLIP) IMPLANT
CLIP APPLIE 9.375 MED OPEN (MISCELLANEOUS) IMPLANT
CNTNR URN SCR LID CUP LEK RST (MISCELLANEOUS) IMPLANT
COLLAGEN CELLERATERX 5 GRAM (Miscellaneous) IMPLANT
CONT SPEC 4OZ STRL OR WHT (MISCELLANEOUS)
COVER SURGICAL LIGHT HANDLE (MISCELLANEOUS) ×2 IMPLANT
DRAIN CHANNEL 19F RND (DRAIN) IMPLANT
DRAIN JP 10F RND SILICONE (MISCELLANEOUS) IMPLANT
DRAPE HALF SHEET 40X57 (DRAPES) IMPLANT
DRAPE IMP U-DRAPE 54X76 (DRAPES) ×2 IMPLANT
DRAPE INCISE 23X17 STRL (DRAPES) ×2 IMPLANT
DRAPE INCISE IOBAN 23X17 STRL (DRAPES) ×2 IMPLANT
DRAPE INCISE IOBAN 66X45 STRL (DRAPES) IMPLANT
DRAPE LAPAROSCOPIC ABDOMINAL (DRAPES) IMPLANT
DRAPE LAPAROTOMY 100X72 PEDS (DRAPES) ×2 IMPLANT
DRAPE ORTHO SPLIT 77X108 STRL (DRAPES) ×4
DRAPE SURG 17X23 STRL (DRAPES) ×8 IMPLANT
DRAPE SURG ORHT 6 SPLT 77X108 (DRAPES) ×4 IMPLANT
DRESSING MORCELLS FINE 1000 (Tissue) IMPLANT
DRESSING PEEL AND PLAC PRVNA20 (GAUZE/BANDAGES/DRESSINGS) IMPLANT
DRSG ADAPTIC 3X8 NADH LF (GAUZE/BANDAGES/DRESSINGS) IMPLANT
DRSG PEEL AND PLACE PREVENA 20 (GAUZE/BANDAGES/DRESSINGS) ×2
DRSG TEGADERM 4X4.75 (GAUZE/BANDAGES/DRESSINGS) IMPLANT
DRSG VAC GRANUFOAM LG (GAUZE/BANDAGES/DRESSINGS) IMPLANT
DRSG VAC GRANUFOAM MED (GAUZE/BANDAGES/DRESSINGS) IMPLANT
DRSG VAC GRANUFOAM SM (GAUZE/BANDAGES/DRESSINGS) IMPLANT
ELECT BLADE 4.0 EZ CLEAN MEGAD (MISCELLANEOUS) ×2
ELECT BLADE 6.5 EXT (BLADE) IMPLANT
ELECT CAUTERY BLADE 6.4 (BLADE) ×2 IMPLANT
ELECT REM PT RETURN 9FT ADLT (ELECTROSURGICAL) ×2
ELECTRODE BLDE 4.0 EZ CLN MEGD (MISCELLANEOUS) IMPLANT
ELECTRODE REM PT RTRN 9FT ADLT (ELECTROSURGICAL) ×2 IMPLANT
EVACUATOR SILICONE 100CC (DRAIN) IMPLANT
EVACUATOR SMOKE ACCUVAC VALLEY (FILTER) ×2
GAUZE PAD ABD 8X10 STRL (GAUZE/BANDAGES/DRESSINGS) ×2 IMPLANT
GAUZE SPONGE 4X4 12PLY STRL (GAUZE/BANDAGES/DRESSINGS) ×2 IMPLANT
GAUZE XEROFORM 5X9 LF (GAUZE/BANDAGES/DRESSINGS) IMPLANT
GLOVE SURG SS PI 6.5 STRL IVOR (GLOVE) IMPLANT
GLOVE SURG SS PI 7.5 STRL IVOR (GLOVE) IMPLANT
GOWN STRL REUS W/ TWL LRG LVL3 (GOWN DISPOSABLE) ×6 IMPLANT
GOWN STRL REUS W/TWL LRG LVL3 (GOWN DISPOSABLE) ×6
HEMOSTAT ARISTA ABSORB 3G PWDR (HEMOSTASIS) IMPLANT
KIT BASIN OR (CUSTOM PROCEDURE TRAY) ×2 IMPLANT
KIT TURNOVER KIT B (KITS) ×2 IMPLANT
MARKER SKIN DUAL TIP RULER LAB (MISCELLANEOUS) ×2 IMPLANT
NDL HYPO 25GX1X1/2 BEV (NEEDLE) ×2 IMPLANT
NEEDLE HYPO 25GX1X1/2 BEV (NEEDLE) ×2 IMPLANT
NS IRRIG 1000ML POUR BTL (IV SOLUTION) ×4 IMPLANT
PACK GENERAL/GYN (CUSTOM PROCEDURE TRAY) ×2 IMPLANT
PACK UNIVERSAL I (CUSTOM PROCEDURE TRAY) ×2 IMPLANT
PAD ARMBOARD 7.5X6 YLW CONV (MISCELLANEOUS) ×4 IMPLANT
PENCIL BUTTON HOLSTER BLD 10FT (ELECTRODE) ×2 IMPLANT
PREFILTER EVAC NS 1 1/3-3/8IN (MISCELLANEOUS) ×2 IMPLANT
SPONGE T-LAP 18X18 ~~LOC~~+RFID (SPONGE) IMPLANT
STAPLER VISISTAT 35W (STAPLE) ×2 IMPLANT
STRIP CLOSURE SKIN 1/2X4 (GAUZE/BANDAGES/DRESSINGS) ×2 IMPLANT
SURGILUBE 2OZ TUBE FLIPTOP (MISCELLANEOUS) IMPLANT
SUT MNCRL AB 3-0 PS2 18 (SUTURE) IMPLANT
SUT MNCRL AB 3-0 PS2 27 (SUTURE) IMPLANT
SUT MNCRL AB 4-0 PS2 18 (SUTURE) IMPLANT
SUT MON AB 2-0 CT1 36 (SUTURE) IMPLANT
SUT MON AB 5-0 PS2 18 (SUTURE) IMPLANT
SUT PDS AB 0 CT 36 (SUTURE) IMPLANT
SUT PDS AB 2-0 CT2 27 (SUTURE) IMPLANT
SUT PDS AB 3-0 SH 27 (SUTURE) IMPLANT
SUT PROLENE 3 0 PS 1 (SUTURE) IMPLANT
SUT SILK 3 0 SH 30 (SUTURE) IMPLANT
SUT VIC AB 3-0 FS2 27 (SUTURE) IMPLANT
SUT VIC AB 3-0 SH 8-18 (SUTURE) IMPLANT
SUT VIC AB 5-0 PS2 18 (SUTURE) IMPLANT
SUT VICRYL 3 0 (SUTURE) IMPLANT
SWAB COLLECTION DEVICE MRSA (MISCELLANEOUS) IMPLANT
SWAB CULTURE ESWAB REG 1ML (MISCELLANEOUS) IMPLANT
SYR 50ML SLIP (SYRINGE) IMPLANT
SYR BULB IRRIG 60ML STRL (SYRINGE) ×2 IMPLANT
SYR CONTROL 10ML LL (SYRINGE) ×2 IMPLANT
TOWEL GREEN STERILE (TOWEL DISPOSABLE) ×2 IMPLANT
TOWEL GREEN STERILE FF (TOWEL DISPOSABLE) ×2 IMPLANT
TUBE CONNECTING 12X1/4 (SUCTIONS) ×2 IMPLANT
UNDERPAD 30X36 HEAVY ABSORB (UNDERPADS AND DIAPERS) ×2 IMPLANT

## 2023-01-31 NOTE — Interval H&P Note (Signed)
History and Physical Interval Note:  01/31/2023 2:58 PM  Bryan Brock  has presented today for surgery, with the diagnosis of Sternal wound.  The various methods of treatment have been discussed with the patient and family. After consideration of risks, benefits and other options for treatment, the patient has consented to  Procedure(s): RIGHT PECTORALIS FLAP (Right) IRRIGATION AND DEBRIDEMENT OF STERNAL WOUND (N/A) as a surgical intervention.  The patient's history has been reviewed, patient examined, no change in status, stable for surgery.  I have reviewed the patient's chart and labs.  Questions were answered to the patient's satisfaction.     Alena Bills Blakleigh Straw

## 2023-01-31 NOTE — Anesthesia Postprocedure Evaluation (Signed)
Anesthesia Post Note  Patient: Bryan Brock  Procedure(s) Performed: RIGHT PECTORALIS FLAP (Right: Chest) IRRIGATION AND DEBRIDEMENT OF STERNAL WOUND (Chest)     Patient location during evaluation: PACU Anesthesia Type: General Level of consciousness: awake and alert Pain management: pain level controlled Vital Signs Assessment: post-procedure vital signs reviewed and stable Respiratory status: spontaneous breathing, nonlabored ventilation, respiratory function stable and patient connected to nasal cannula oxygen Cardiovascular status: blood pressure returned to baseline and stable Postop Assessment: no apparent nausea or vomiting Anesthetic complications: no   No notable events documented.  Last Vitals:  Vitals:   01/31/23 1930 01/31/23 1945  BP: (!) 140/71 134/74  Pulse: 69 71  Resp: (!) 24 19  Temp:  36.7 C  SpO2: 95% 95%    Last Pain:  Vitals:   01/31/23 2202  TempSrc:   PainSc: 10-Worst pain ever                 Earl Lites P Parsa Rickett

## 2023-01-31 NOTE — Transfer of Care (Signed)
Immediate Anesthesia Transfer of Care Note  Patient: Bryan Brock  Procedure(s) Performed: RIGHT PECTORALIS FLAP (Right: Chest) IRRIGATION AND DEBRIDEMENT OF STERNAL WOUND (Chest)  Patient Location: PACU  Anesthesia Type:General  Level of Consciousness: awake, alert , and oriented  Airway & Oxygen Therapy: Patient Spontanous Breathing and Patient connected to face mask oxygen  Post-op Assessment: Report given to RN and Post -op Vital signs reviewed and stable  Post vital signs: Reviewed and stable  Last Vitals:  Vitals Value Taken Time  BP 128/69   Temp    Pulse 68 01/31/23 1906  Resp 23   SpO2 98 01/31/23 1906  Vitals shown include unvalidated device data.  Last Pain:  Vitals:   01/31/23 1548  TempSrc: Oral  PainSc: 0-No pain      Patients Stated Pain Goal: 3 (01/30/23 1952)  Complications: No notable events documented.

## 2023-01-31 NOTE — Progress Notes (Signed)
Mobility Specialist Progress Note:    01/31/23 1522  Mobility  Activity Refused mobility  Mobility Specialist Start Time (ACUTE ONLY) 1457  Mobility Specialist Stop Time (ACUTE ONLY) 1521  Mobility Specialist Time Calculation (min) (ACUTE ONLY) 24 min   Pt refused mobility d/t sx soon.  Thompson Grayer Mobility Specialist  Please contact vis Secure Chat or  Rehab Office [phone]

## 2023-01-31 NOTE — Anesthesia Preprocedure Evaluation (Signed)
Anesthesia Evaluation  Patient identified by MRN, date of birth, ID band Patient awake    Reviewed: Allergy & Precautions, NPO status , Patient's Chart, lab work & pertinent test results  History of Anesthesia Complications (+) PONV and history of anesthetic complications  Airway Mallampati: III  TM Distance: >3 FB Neck ROM: Full    Dental  (+) Teeth Intact, Dental Advisory Given   Pulmonary asthma , sleep apnea    breath sounds clear to auscultation       Cardiovascular hypertension, + CAD, + CABG and + Peripheral Vascular Disease  + dysrhythmias + Valvular Problems/Murmurs MVP  Rhythm:Regular Rate:Normal  Echo: 1. Left ventricular ejection fraction, by estimation, is 55 to 60%. The  left ventricle has normal function. The left ventricle has no regional  wall motion abnormalities. There is mild concentric left ventricular  hypertrophy. Left ventricular diastolic  parameters are consistent with Grade I diastolic dysfunction (impaired  relaxation). Elevated left atrial pressure.   2. Right ventricular systolic function is normal. The right ventricular  size is normal.   3. Left atrial size was moderately dilated.   4. The mitral valve is normal in structure. No evidence of mitral valve  regurgitation.   5. The aortic valve is tricuspid. Aortic valve regurgitation is not  visualized. No aortic stenosis is present.     Neuro/Psych  PSYCHIATRIC DISORDERS Anxiety Depression Bipolar Disorder      GI/Hepatic hiatal hernia,GERD  ,,(+) Hepatitis -  Endo/Other  negative endocrine ROS    Renal/GU negative Renal ROS     Musculoskeletal  (+) Arthritis ,    Abdominal   Peds  Hematology negative hematology ROS (+)   Anesthesia Other Findings   Reproductive/Obstetrics                              Anesthesia Physical Anesthesia Plan  ASA: 3  Anesthesia Plan: General   Post-op Pain Management:     Induction: Intravenous  PONV Risk Score and Plan: 3 and Ondansetron, Midazolam and Treatment may vary due to age or medical condition  Airway Management Planned: LMA  Additional Equipment:   Intra-op Plan:   Post-operative Plan: Extubation in OR  Informed Consent: I have reviewed the patients History and Physical, chart, labs and discussed the procedure including the risks, benefits and alternatives for the proposed anesthesia with the patient or authorized representative who has indicated his/her understanding and acceptance.     Dental advisory given  Plan Discussed with: CRNA  Anesthesia Plan Comments:         Anesthesia Quick Evaluation  

## 2023-01-31 NOTE — Anesthesia Procedure Notes (Signed)
Procedure Name: Intubation Date/Time: 01/31/2023 4:24 PM  Performed by: Marena Chancy, CRNAPre-anesthesia Checklist: Patient identified, Emergency Drugs available, Suction available and Patient being monitored Patient Re-evaluated:Patient Re-evaluated prior to induction Oxygen Delivery Method: Circle System Utilized Preoxygenation: Pre-oxygenation with 100% oxygen Induction Type: IV induction Ventilation: Mask ventilation without difficulty Laryngoscope Size: Mac and 4 Grade View: Grade I Tube type: Oral Tube size: 7.5 mm Number of attempts: 1 Airway Equipment and Method: Stylet and Oral airway Placement Confirmation: ETT inserted through vocal cords under direct vision, positive ETCO2 and breath sounds checked- equal and bilateral Tube secured with: Tape Dental Injury: Teeth and Oropharynx as per pre-operative assessment

## 2023-01-31 NOTE — Op Note (Signed)
DATE OF OPERATION: 01/31/2023  LOCATION: [facility] Inpatient  PREOPERATIVE DIAGNOSIS: Sternal / Chest wound with infection   POSTOPERATIVE DIAGNOSIS: Same  PROCEDURE: 1. Right Pectoralis Major Muscle Advancement Flap 2. Placement of myriad powder 2 gm 3. Placement of VAC  SURGEON: Foster Simpson, DO  ASSISTANT: Jeff Hedges, PA  EBL: less than 50 cc  SPECIMEN: None  DRAINS: two # 19 blake round drain  CONDITION: Stable  COMPLICATIONS: None  INDICATION: ATION: The patient, Bryan Brock, is a 67 y.o. 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  He developed a wound and infection. He has undergone several debridements.  He has not healed up the area. He presents for closure.      PROCEDURE DETAILS:  The patient was seen on the day of her surgery and marked. The patient was taken to the operating room and given a general anesthetic. A standard time out was performed and all information was confirmed by those in the room.  SCD's were placed and all key points padded. He was then prepped and draped in the standard sterile fashion using a chloroprep. The previous sutures were removed. The procedure began by washing out the defect with Vashe and saline.     The right medial margin of the pectoralis major muscle was identified at the sternal margin.  The skin and soft tissue was lifted off the muscle with dissection carried to the insertion. All margins of the pectoralis were identified. The muscle was then released inferiorly and medially and care was taken not to pick up the serratus anteriorly. The flap was then raised to the insertion.Clips were placed at the origin of the pectoralis. Care was taken to protect the vasculature throughout the procedure. The muscle looked healthy throughout the case.  The muscle was then secured to the left medial sternal area and soft tissue with the 3-0 Vicryl. It covered the majority of the sternum minus 2-3 cm  inferiorly. One drain was placed at the lateral aspect of the right chest wall and secured with 3-0 Silk. Another drain was placed on the left side over the pectoralis muscle. All of the myriad powder 2 gm was placed at the inferior portion of the sternum. Arista was also used in the right pocket as well as Cellerate.  Experel was injected into the right pectoralis minor fascia.  The chest was then closed with the 2-0 and 3-0 PDS.  The skin was closed with the 3-0 Monocryl. The VAC was applied and had an excellent seal. The patient was allowed to wake up and taken to recovery room in stable condition at the end of the case. The family was notified at the end of the case.   The advanced practice practitioner (APP) assisted throughout the case.  The APP was essential in retraction and counter traction when needed to make the case progress smoothly.  This retraction and assistance made it possible to see the tissue plans for the procedure.  The assistance was needed for blood control, tissue re-approximation and assisted with closure of the incision site.

## 2023-01-31 NOTE — Progress Notes (Addendum)
[address]       [place] [address]             [phone]      2 Days Post-Op Procedure(s) (LRB): Sternal wound irrigation and debridement, placement of myriad and wound VAC change (N/A) APPLICATION OF SKIN SUBSTITUTE (N/A) APPLICATION OF WOUND VAC (N/A) Subjective: Pt with no new complaints this AM, ready for next procedure.  Objective: Vital signs in last 24 hours: Temp:  [97.8 F (36.6 C)-98.4 F (36.9 C)] 97.8 F (36.6 C) (06/07 0344) Pulse Rate:  [63-78] 63 (06/07 0344) Cardiac Rhythm: Normal sinus rhythm (06/06 2118) Resp:  [20] 20 (06/07 0344) BP: (107-125)/(62-74) 125/74 (06/07 0344) SpO2:  [95 %-100 %] 95 % (06/07 0344) FiO2 (%):  [21 %] 21 % (06/06 0855)  Hemodynamic parameters for last 24 hours:    Intake/Output from previous day: 06/06 0701 - 06/07 0700 In: 400 [P.O.:400] Out: 1900 [Urine:1750; Drains:150] Intake/Output this shift: No intake/output data recorded.  General appearance: alert, cooperative, and no distress Neurologic: intact Heart: regular rate and rhythm, S1, S2 normal, no murmur, click, rub or gallop Lungs: clear to auscultation bilaterally Abdomen: soft, non-tender; bowel sounds normal; no masses,  no organomegaly Extremities: edema 1+, ecchymosis around [facility] sites Wound: 150cc serosanguinous drainage in VAC canister, EVH sites clean and dry.   Lab Results: Recent Labs    01/30/23 0243  WBC 4.9  HGB 9.7*  HCT 32.2*  PLT 309   BMET:  Recent Labs    01/30/23 0243 01/31/23 0219  NA 133* 133*  K 5.2* 4.6  CL 100 101  CO2 25 25  GLUCOSE 221* 157*  BUN 21 29*  CREATININE 1.34* 1.34*  CALCIUM 8.4* 8.1*    PT/INR: No results for input(s): "LABPROT", "INR" in the last 72 hours. ABG    Component Value Date/Time   PHART 7.399 01/08/2023 0830   HCO3 28.2 (H) 01/08/2023 0830   TCO2 30 01/08/2023 0830   ACIDBASEDEF 1.0 01/07/2023 1936   O2SAT 97 01/08/2023 0830   CBG (last 3)  Recent Labs     01/30/23 1622 01/30/23 2106 01/31/23 0645  GLUCAP 126* 124* 90    Assessment/Plan: S/P Procedure(s) (LRB): Sternal wound irrigation and debridement, placement of myriad and wound VAC change (N/A) APPLICATION OF SKIN SUBSTITUTE (N/A) APPLICATION OF WOUND VAC (N/A)  CV: NSR, HR 70s. SBP 107-125. Hx of afib. Continue Amiodarone 200mg  daily and Lopressor 37.5mg  BID.   Pulm: CXR yesterday with bibasilar atelectasis and small left pleural effusion. Saturating 95-100% on RA. Encourage IS, flutter valve and ambulation.    GI: Good appetite, no nausea. +BM 06/05   Endo: CBGs controlled on SSI, will monitor. New diabetes diagnosis A1C 6.6, outpatient appt with PCP has been arranged.   Renal: Stable Cr 1.34 this AM. Good UO 1750cc/24hrs.    Hypokalemia/hyperkalemia: Resolved, continue to monitor   ID:  Leukocytosis resolved, afebrile. Keflex course completed yesterday   Hyponatremia: Mild and stable, Na 133. Will monitor.   Expected postop ABLA: Last H/H stable 9.7/32.2   Sternal dehiscence: 150cc serosanguinous output, with leak and VAC alerating blockage this AM likely due to serous drainage. Replaced tubing and reinforced draping, ok seal and suctioning at when I left the room. Plastic surgery to take him to the OR today for right pectoralis muscle flap closure.   Deconditioning: CIR evaluated, currently a candidate and they will continue to follow him. PT recommended CIR. Continue work with  PT/OT.    Dispo: To OR today, CIR following for possible admission once ready for d/c.    LOS: 29 days    Jenny Reichmann, PA-C 01/31/2023   Chart reviewed, patient examined, agree with above. Planning muscle flap closure of sternal defect later today.

## 2023-01-31 NOTE — Progress Notes (Signed)
Inpatient Rehab Admissions Coordinator:    CIR following. Pt. To return to OR today. Will follow up post op for potential admit this weekend vs early next week.   Megan Salon, MS, CCC-SLP Rehab Admissions Coordinator  [phone] (celll) [phone] (office)

## 2023-02-01 ENCOUNTER — Encounter (HOSPITAL_COMMUNITY): Payer: Self-pay | Admitting: Plastic Surgery

## 2023-02-01 ENCOUNTER — Inpatient Hospital Stay (HOSPITAL_COMMUNITY): Payer: Medicare Other

## 2023-02-01 ENCOUNTER — Other Ambulatory Visit: Payer: Self-pay

## 2023-02-01 LAB — CBC
HCT: 33.8 % — ABNORMAL LOW (ref 39.0–52.0)
HCT: 32 % — ABNORMAL LOW (ref 39.0–52.0)
HCT: 33.5 % — ABNORMAL LOW (ref 39.0–52.0)
Hemoglobin: 10.1 g/dL — ABNORMAL LOW (ref 13.0–17.0)
Hemoglobin: 9.6 g/dL — ABNORMAL LOW (ref 13.0–17.0)
Hemoglobin: 9.8 g/dL — ABNORMAL LOW (ref 13.0–17.0)
MCH: 29.2 pg (ref 26.0–34.0)
MCH: 28.8 pg (ref 26.0–34.0)
MCH: 28.6 pg (ref 26.0–34.0)
MCHC: 29.9 g/dL — ABNORMAL LOW (ref 30.0–36.0)
MCHC: 30 g/dL (ref 30.0–36.0)
MCHC: 29.3 g/dL — ABNORMAL LOW (ref 30.0–36.0)
MCV: 97.7 fL (ref 80.0–100.0)
MCV: 96.1 fL (ref 80.0–100.0)
MCV: 97.7 fL (ref 80.0–100.0)
Platelets: 329 10*3/uL (ref 150–400)
Platelets: 386 10*3/uL (ref 150–400)
Platelets: 351 10*3/uL (ref 150–400)
RBC: 3.46 MIL/uL — ABNORMAL LOW (ref 4.22–5.81)
RBC: 3.33 MIL/uL — ABNORMAL LOW (ref 4.22–5.81)
RBC: 3.43 MIL/uL — ABNORMAL LOW (ref 4.22–5.81)
RDW: 14.6 % (ref 11.5–15.5)
RDW: 14.7 % (ref 11.5–15.5)
RDW: 14.9 % (ref 11.5–15.5)
WBC: 8 10*3/uL (ref 4.0–10.5)
WBC: 7.5 10*3/uL (ref 4.0–10.5)
WBC: 7.6 10*3/uL (ref 4.0–10.5)
nRBC: 0 % (ref 0.0–0.2)
nRBC: 0 % (ref 0.0–0.2)
nRBC: 0.3 % — ABNORMAL HIGH (ref 0.0–0.2)

## 2023-02-01 LAB — BASIC METABOLIC PANEL
Anion gap: 12 (ref 5–15)
BUN: 16 mg/dL (ref 8–23)
CO2: 25 mmol/L (ref 22–32)
Calcium: 8.3 mg/dL — ABNORMAL LOW (ref 8.9–10.3)
Chloride: 99 mmol/L (ref 98–111)
Creatinine, Ser: 1.14 mg/dL (ref 0.61–1.24)
GFR, Estimated: >60 mL/min (ref >60)
Glucose, Bld: 143 mg/dL — ABNORMAL HIGH (ref 70–99)
Potassium: 4.4 mmol/L (ref 3.5–5.1)
Sodium: 136 mmol/L (ref 135–145)

## 2023-02-01 LAB — GLUCOSE, CAPILLARY
Glucose-Capillary: 134 mg/dL — ABNORMAL HIGH (ref 70–99)
Glucose-Capillary: 144 mg/dL — ABNORMAL HIGH (ref 70–99)
Glucose-Capillary: 111 mg/dL — ABNORMAL HIGH (ref 70–99)
Glucose-Capillary: 164 mg/dL — ABNORMAL HIGH (ref 70–99)

## 2023-02-01 MED ORDER — SODIUM CHLORIDE 0.9 % IV BOLUS
1000.0000 mL | Freq: Once | INTRAVENOUS | Status: AC
  Administered 2023-02-01: 1000 mL via INTRAVENOUS

## 2023-02-01 MED ORDER — CHLORHEXIDINE GLUCONATE CLOTH 2 % EX PADS
6.0000 | MEDICATED_PAD | Freq: Once | CUTANEOUS | Status: AC
  Administered 2023-02-01: 6 via TOPICAL

## 2023-02-01 MED ORDER — CHLORHEXIDINE GLUCONATE CLOTH 2 % EX PADS: 6.0000 | MEDICATED_PAD | Freq: Once | CUTANEOUS | Status: AC

## 2023-02-01 MED ORDER — TRANEXAMIC ACID-NACL 1000-0.7 MG/100ML-% IV SOLN
1000.0000 mg | Freq: Once | INTRAVENOUS | Status: AC
  Administered 2023-02-01: 1000 mg via INTRAVENOUS
  Filled 2023-02-01: qty 100

## 2023-02-01 NOTE — Progress Notes (Signed)
JP drain output noted >700 cc this am 7am-915am.   CTS notified  Plastic surgery notified and arrived at bedside.   New orders received.

## 2023-02-01 NOTE — Progress Notes (Addendum)
      [address]       [place] [address]             [phone]        1 Day Post-Op Procedure(s) (LRB): RIGHT PECTORALIS FLAP (Right) IRRIGATION AND DEBRIDEMENT OF STERNAL WOUND (N/A)  Subjective: Patient sleeping and awakened. He is having sternal pain (from recent surgery)  Objective: Vital signs in last 24 hours: Temp:  [98 F (36.7 C)-98.7 F (37.1 C)] 98.5 F (36.9 C) (06/08 0442) Pulse Rate:  [68-94] 94 (06/08 0442) Cardiac Rhythm: Normal sinus rhythm (06/07 2115) Resp:  [17-24] 20 (06/08 0442) BP: (119-146)/(60-81) 146/68 (06/08 0442) SpO2:  [93 %-96 %] 96 % (06/08 0442) Weight:  [148.8 kg] 148.8 kg (06/08 0500)   Current Weight  02/01/23 (!) 148.8 kg      Intake/Output from previous day: 06/07 0701 - 06/08 0700 In: 1480 [P.O.:480; I.V.:1000] Out: 1330 [Urine:850; Drains:455; Blood:25]   Physical Exam:  Cardiovascular: RRR Pulmonary: Clear to auscultation bilaterally Abdomen: Soft, non tender, bowel sounds present. Extremities: Ted hose/SCDs in place this am Wounds: VAC in place on sternal wound. LE wounds well healed  Lab Results: CBC: Recent Labs    01/30/23 0243 02/01/23 0202  WBC 4.9 8.0  HGB 9.7* 10.1*  HCT 32.2* 33.8*  PLT 309 329   BMET:  Recent Labs    01/31/23 0219 02/01/23 0202  NA 133* 136  K 4.6 4.4  CL 101 99  CO2 25 25  GLUCOSE 157* 143*  BUN 29* 16  CREATININE 1.34* 1.14  CALCIUM 8.1* 8.3*    PT/INR:  Lab Results  Component Value Date   INR 1.3 (H) 01/02/2023   INR 1.0 12/31/2022   ABG:  INR: Will add last result for INR, ABG once components are confirmed Will add last 4 CBG results once components are confirmed  Assessment/Plan:  1. CV - SR.  On Amiodarone 200 mg daily and Lopressor 37.5 mg bid.  2.  Pulmonary - On room air. Encourage incentive spirometer. 3.  Expected post op acute blood loss anemia - H and H stable at 10.1 and 33.8 4. DM-CBGs 97/91/134. Pre op HGA1C 6.6. Will need  follow up with medical doctor after discharge. 5. Sternal dehiscence/wound infection-VAC in place and functioning. Appreciate plastic surgery assistance 4. Deconditioned-PT/OT. May need CIR at discharge   [facility] 7:56 AM   Agree with above S/p pec flap Dispo planning  Analucia Hush O Titus Drone

## 2023-02-01 NOTE — Progress Notes (Signed)
1 Day Post-Op  Subjective:  POD #1 SP right pec flap Pt reports some post op pain, nursing notes some anxiety Drain output increased; right drain with 240 surgery- 7AM, 220cc 7am-9am, 80cc 9am-9;45AM, left 90 cc 7AM- 9:45  Objective: Vital signs in last 24 hours: Temp:  [98 F (36.7 C)-98.7 F (37.1 C)] 98.1 F (36.7 C) (06/08 0900) Pulse Rate:  [68-94] 85 (06/08 0900) Resp:  [17-24] 20 (06/08 0900) BP: (109-146)/(60-81) 109/67 (06/08 0900) SpO2:  [90 %-98 %] 98 % (06/08 0900) Weight:  [148.8 kg] 148.8 kg (06/08 0500) Last BM Date : 01/29/23   Physical Exam:  General: Pt resting comfortably, some confusion this morning Wound Vac in place with good suction Right drain with blood output, left serosanguinous Chest with no swelling or redness   Assessment/Plan: s/p Procedure(s): RIGHT PECTORALIS FLAP IRRIGATION AND DEBRIDEMENT OF STERNAL WOUND   POD #1 SP right pec flap Pt reports some post op pain, nursing notes some anxiety Drain output increased; right drain with 240 surgery- 7AM, 220cc 7am-9am, 80cc 9am-9;45AM, left 90 cc 7AM- 9:45 Hg 10.1 - baseline  Given the increased drain output plan for stat repeat CBC. Uncertain true output overnight given it was changed at shift change. We will place ABDs over the right chest to add some additional compression. Drains have been emptied and we will continue to monitor closely. Case discussed with Dr. Ulice Bold. Await drain output updates and CBC. PT did have some water and oral medication this morning. Please keep NPO. Call with any updates or concerns. Nursing has both my and Dr. Gus Height phone numbers.

## 2023-02-02 LAB — GLUCOSE, CAPILLARY
Glucose-Capillary: 113 mg/dL — ABNORMAL HIGH (ref 70–99)
Glucose-Capillary: 172 mg/dL — ABNORMAL HIGH (ref 70–99)
Glucose-Capillary: 132 mg/dL — ABNORMAL HIGH (ref 70–99)
Glucose-Capillary: 128 mg/dL — ABNORMAL HIGH (ref 70–99)

## 2023-02-02 LAB — CBC
HCT: 30.7 % — ABNORMAL LOW (ref 39.0–52.0)
Hemoglobin: 9 g/dL — ABNORMAL LOW (ref 13.0–17.0)
MCH: 28.6 pg (ref 26.0–34.0)
MCHC: 29.3 g/dL — ABNORMAL LOW (ref 30.0–36.0)
MCV: 97.5 fL (ref 80.0–100.0)
Platelets: 316 10*3/uL (ref 150–400)
RBC: 3.15 MIL/uL — ABNORMAL LOW (ref 4.22–5.81)
RDW: 15 % (ref 11.5–15.5)
WBC: 4.6 10*3/uL (ref 4.0–10.5)
nRBC: 0 % (ref 0.0–0.2)

## 2023-02-02 NOTE — Progress Notes (Addendum)
      [address]       [place] [address]             [phone]        2 Days Post-Op Procedure(s) (LRB): RIGHT PECTORALIS FLAP (Right) IRRIGATION AND DEBRIDEMENT OF STERNAL WOUND (N/A)  Subjective: Patient feels better this am.   Objective: Vital signs in last 24 hours: Temp:  [97.6 F (36.4 C)-98.7 F (37.1 C)] 98.7 F (37.1 C) (06/08 2045) Pulse Rate:  [70-88] 88 (06/08 2045) Cardiac Rhythm: Normal sinus rhythm (06/08 2027) Resp:  [18-20] 20 (06/08 2045) BP: (89-110)/(45-68) 105/53 (06/08 2045) SpO2:  [86 %-98 %] 93 % (06/08 2045) Weight:  [149.4 kg] 149.4 kg (06/09 0500)   Current Weight  02/02/23 (!) 149.4 kg      Intake/Output from previous day: 06/08 0701 - 06/09 0700 In: 480 [P.O.:480] Out: 1100 [Urine:300; Drains:800]   Physical Exam:  Cardiovascular: RRR Pulmonary: Clear to auscultation bilaterally Abdomen: Soft, non tender, bowel sounds present. Extremities: Ted hose in place this am Wounds: VAC in place on sternal wound. LE wounds well healed JP drains with light bloody drainage  Lab Results: CBC: Recent Labs    02/01/23 1503 02/02/23 0205  WBC 7.6 4.6  HGB 9.8* 9.0*  HCT 33.5* 30.7*  PLT 351 316    BMET:  Recent Labs    01/31/23 0219 02/01/23 0202  NA 133* 136  K 4.6 4.4  CL 101 99  CO2 25 25  GLUCOSE 157* 143*  BUN 29* 16  CREATININE 1.34* 1.14  CALCIUM 8.1* 8.3*     PT/INR:  Lab Results  Component Value Date   INR 1.3 (H) 01/02/2023   INR 1.0 12/31/2022   ABG:  INR: Will add last result for INR, ABG once components are confirmed Will add last 4 CBG results once components are confirmed  Assessment/Plan:  1. CV - SR.  On Amiodarone 200 mg daily and Lopressor 37.5 mg bid.  2.  Pulmonary - On room air. Encourage incentive spirometer. 3.  Expected post op acute blood loss anemia - H and H stable decreased to 9 and 30.7. JP drain output decreasing. 4. DM-CBGs 111/164/113. Pre op HGA1C 6.6.  Will need follow up with medical doctor after discharge. 5. Sternal dehiscence/wound infection-VAC in place and functioning. JP drains with 190 cc last 12 hours. Appreciate plastic surgery assistance 4. Deconditioned-PT/OT. May need CIR at discharge   [facility] 8:56 AM   Agree with above Dispo planning  Karletta Millay Keane Scrape

## 2023-02-02 NOTE — Progress Notes (Signed)
2 Days Post-Op  Subjective:  POD #2 SP right Pectoralis flap  Sitting up in no distress, notes some pain in bilateral axilla R>L Vitals have remained stable  Objective: Vital signs in last 24 hours: Temp:  [97.6 F (36.4 C)-98.7 F (37.1 C)] 98.7 F (37.1 C) (06/08 2045) Pulse Rate:  [70-88] 88 (06/08 2045) Resp:  [18-20] 20 (06/08 2045) BP: (89-110)/(45-68) 105/53 (06/08 2045) SpO2:  [86 %-93 %] 93 % (06/08 2045) Weight:  [149.4 kg] 149.4 kg (06/09 0500) Last BM Date : 01/29/23   Physical Exam:  General: Pt resting comfortably in no acute distress Wound vac in place with good suction, no surrounding redness JP drains with bloody output bilateral  Chest with no palpable fluctuance     Assessment/Plan: s/p Procedure(s): RIGHT PECTORALIS FLAP IRRIGATION AND DEBRIDEMENT OF STERNAL WOUND   POD # 2 right Pectoralis flap  Bryan Brock subjectively feeling much better today Drain continue to have ongoing output, he appears to be compensating well. Vitals have remained stable with minimal drop in hemoglobin to 9.0 from 9.8, relatively stable over course of hospitalization. Will continue to trend. Continue monitoring drain output. Call with any questions or concerns.     Kelle Darting Tremell Reimers, PA-C 02/02/2023

## 2023-02-03 LAB — CBC
HCT: 28.5 % — ABNORMAL LOW (ref 39.0–52.0)
Hemoglobin: 8.5 g/dL — ABNORMAL LOW (ref 13.0–17.0)
MCH: 29.3 pg (ref 26.0–34.0)
MCHC: 29.8 g/dL — ABNORMAL LOW (ref 30.0–36.0)
MCV: 98.3 fL (ref 80.0–100.0)
Platelets: 313 10*3/uL (ref 150–400)
RBC: 2.9 MIL/uL — ABNORMAL LOW (ref 4.22–5.81)
RDW: 14.7 % (ref 11.5–15.5)
WBC: 4.4 10*3/uL (ref 4.0–10.5)
nRBC: 0 % (ref 0.0–0.2)

## 2023-02-03 LAB — GLUCOSE, CAPILLARY
Glucose-Capillary: 122 mg/dL — ABNORMAL HIGH (ref 70–99)
Glucose-Capillary: 126 mg/dL — ABNORMAL HIGH (ref 70–99)
Glucose-Capillary: 100 mg/dL — ABNORMAL HIGH (ref 70–99)
Glucose-Capillary: 99 mg/dL (ref 70–99)

## 2023-02-03 NOTE — Progress Notes (Signed)
Mobility Specialist Progress Note:    02/03/23 1552  Mobility  Activity Refused mobility   Pt received in bed, refusing to ambulate today d/t fatigue and tiredness. Will f/u tomorrow.   Thompson Grayer Mobility Specialist  Please contact vis Secure Chat or  Rehab Office [phone]

## 2023-02-03 NOTE — Progress Notes (Addendum)
[address]       [place] [address]             [phone]     3 Days Post-Op Procedure(s) (LRB): RIGHT PECTORALIS FLAP (Right) IRRIGATION AND DEBRIDEMENT OF STERNAL WOUND (N/A) Subjective: Soreness, some diarrhea- has received a lot of stool softeners/laxatives  Objective: Vital signs in last 24 hours: Temp:  [97.8 F (36.6 C)-98.3 F (36.8 C)] 98.3 F (36.8 C) (06/10 0430) Pulse Rate:  [61-81] 61 (06/10 0430) Cardiac Rhythm: Normal sinus rhythm (06/09 2100) Resp:  [14-26] 14 (06/10 0430) BP: (109-120)/(48-67) 113/48 (06/10 0430) SpO2:  [95 %-100 %] 98 % (06/10 0430)  Hemodynamic parameters for last 24 hours:    Intake/Output from previous day: 06/09 0701 - 06/10 0700 In: 480 [P.O.:480] Out: 1130 [Urine:750; Drains:380] Intake/Output this shift: No intake/output data recorded.  General appearance: alert, cooperative, and no distress Heart: regular rate and rhythm Lungs: clear to auscultation bilaterally Abdomen: benign Extremities: + BLE edema Wound: VAC in place  Lab Results: Recent Labs    02/02/23 0205 02/03/23 0202  WBC 4.6 4.4  HGB 9.0* 8.5*  HCT 30.7* 28.5*  PLT 316 313   BMET:  Recent Labs    02/01/23 0202  NA 136  K 4.4  CL 99  CO2 25  GLUCOSE 143*  BUN 16  CREATININE 1.14  CALCIUM 8.3*    PT/INR: No results for input(s): "LABPROT", "INR" in the last 72 hours. ABG    Component Value Date/Time   PHART 7.399 01/08/2023 0830   HCO3 28.2 (H) 01/08/2023 0830   TCO2 30 01/08/2023 0830   ACIDBASEDEF 1.0 01/07/2023 1936   O2SAT 97 01/08/2023 0830   CBG (last 3)  Recent Labs    02/02/23 1732 02/02/23 2108 02/03/23 0545  GLUCAP 132* 128* 122*    Meds Scheduled Meds:  amiodarone  200 mg Oral Daily   aspirin EC  325 mg Oral Daily   bisacodyl  10 mg Oral Daily   budesonide (PULMICORT) nebulizer solution  0.5 mg Nebulization BID   chlorpheniramine-HYDROcodone  5 mL Oral Q12H   docusate sodium  200 mg Oral  Daily   enoxaparin (LOVENOX) injection  40 mg Subcutaneous Daily   gabapentin  200 mg Oral BID   guaiFENesin  1,200 mg Oral BID   insulin aspart  0-24 Units Subcutaneous TID AC & HS   lamoTRIgine  100 mg Oral Daily   metoCLOPramide  10 mg Oral TID AC   metoprolol tartrate  37.5 mg Oral BID   nicotine  14 mg Transdermal Daily   nutrition supplement (JUVEN)  1 packet Oral BID BM   pantoprazole  40 mg Oral Daily   rosuvastatin  10 mg Oral Daily   zinc sulfate  220 mg Oral Daily   Continuous Infusions: PRN Meds:.albuterol, ALPRAZolam, calcium carbonate, guaiFENesin-dextromethorphan, lactulose, Muscle Rub, ondansetron (ZOFRAN) IV, mouth rinse, oxyCODONE, simethicone, traMADol  Xrays No results found.  Assessment/Plan: S/P Procedure(s) (LRB): RIGHT PECTORALIS FLAP (Right) IRRIGATION AND DEBRIDEMENT OF STERNAL WOUND (N/A)  1 afeb, VSS 2 O2 sats good on RA 3 moderate drainage from bulb drains,380/24 h,  VAC in place, plastics managing 4 fair UOP , unsure if all recorded 5 frequent stooling , 8 recorded, limit laxatives, etc today 6 therapies as able, poss CIR   LOS: 32 days    [name] PA-C Pager [phone] 02/03/2023   Chart reviewed, patient examined, agree with above. He is  doing fairly well overall. Still needs a lot of help with mobilization especially after muscle flap surgery. Hopefully he can go to CIR when ok with plastic surgery.

## 2023-02-03 NOTE — Progress Notes (Signed)
3 Days Post-Op  Subjective:  POD # 3 SP right pectoralis flap PT doing better today, notes ongoing pain in the bilateral axilla R>L Reports today is the first day he feels more like himself  Drains 380/24hrs  Objective: Vital signs in last 24 hours: Temp:  [97.8 F (36.6 C)-98.3 F (36.8 C)] 98.2 F (36.8 C) (06/10 1158) Pulse Rate:  [61-81] 65 (06/10 1158) Resp:  [14-26] 18 (06/10 1158) BP: (112-123)/(48-67) 123/62 (06/10 1158) SpO2:  [95 %-100 %] 95 % (06/10 1158) Last BM Date : 02/02/23   Physical Exam:  General: Pt resting comfortably in no acute distress  Wound vac in place with good suction, no surrounding redness JP drains with bloody output bilateral  Chest with no palpable fluctuance    Assessment/Plan: s/p Procedure(s): RIGHT PECTORALIS FLAP IRRIGATION AND DEBRIDEMENT OF STERNAL WOUND   POD #3 SP right Pectoralis flap  Mr Bromwell is doing well today. His drain output has decreased and has become lighter. Hemoglobin down slightly to 8.5 from 9. Vitals have remained stable. If drain output remains stable to decreased ok to work with PT tomorrow. Repeat CBC tomorrow. Hopefully he will be able to DC to rehab this week.  Plan to leave drains in place as well as incisional vac. He will need to follow up in our clinic next week for vac removal and likely left drain removal. We will continue to follow.   Kelle Darting Sherrina Zaugg, PA-C 02/03/2023

## 2023-02-03 NOTE — Progress Notes (Signed)
Inpatient Rehab Admissions Coordinator:    CIR following. Pt. Is not medically ready for d/c at this time. Per Plastics, hopeful he will be ready tomorrow. Pt. Updated.   Megan Salon, MS, CCC-SLP Rehab Admissions Coordinator  [phone] (celll) [phone] (office)

## 2023-02-03 NOTE — Progress Notes (Signed)
CARDIAC REHAB PHASE I   Pt ambulated in hall with PT. In room with nurse tech, assisting to [facility]. Pt wants to go back to bed for rest this afternoon. Will ask mobility team to walk with pt later today.    Woodroe Chen, RN BSN 02/03/2023 1:54 PM

## 2023-02-03 NOTE — Progress Notes (Signed)
Physical Therapy Treatment Patient Details Name: Bryan Brock MRN: [id number] DOB: Feb 12, 1956 Today's Date: 02/03/2023   History of Present Illness 67 yo male admitted 5/9 for CABG x 4. 5/14 sternal rewiring due to dehiscence coughing. 5/27 sternal wound I&D with VAC placement. 6/5 s/p Sternal wound irrigation and debridement, placement of myriad and wound VAC change.  OR 6/7 for R pec flap with plastic surgery. PMhx: HTN, HLD, bipolar d/o, obesity, sleep apnea, mitral valve prolapse, GERD.    PT Comments    Patient reports incr pain in chest and axilla due to surgery 6/7. Required vc to maintain sternal precautions with rolling and sit to stand. Required min assist for bed mobility and minguard for transfers and ambulation. Only tolerated ambulating 150 ft due to pain and fatigue. Encouraged pt to not talk while walking to help his respiratory status and pt admitted this is VERY difficult for him as he "is a talker." Despite education, he continued to talk and require standing rest breaks (~5 over 150 ft). Feel he continues to need intensive rehab prior to discharge home.    Recommendations for follow up therapy are one component of a multi-disciplinary discharge planning process, led by the attending physician.  Recommendations may be updated based on patient status, additional functional criteria and insurance authorization.  Follow Up Recommendations       Assistance Recommended at Discharge Intermittent Supervision/Assistance  Patient can return home with the following A little help with walking and/or transfers;A little help with bathing/dressing/bathroom;Assistance with cooking/housework;Assist for transportation;Help with stairs or ramp for entrance   Equipment Recommendations  Rolling walker (2 wheels) (pt requesting regular width RW in addition to bariatric width one already delivered, he is OK to pay out of pocket for it (he now plans to order via [facility]).)    Recommendations for  Other Services       Precautions / Restrictions Precautions Precautions: Sternal;Fall Precaution Booklet Issued: No (handout previously delivered) Precaution Comments: Watch SpO2, sternal wound vac Restrictions Weight Bearing Restrictions: Yes RUE Weight Bearing:  (sternal precautions) Other Position/Activity Restrictions: sternal precs; pt has chest binder in place around sternal incision.     Mobility  Bed Mobility Overal bed mobility: Needs Assistance Bed Mobility: Rolling, Sidelying to Sit Rolling: Min assist (to left to simulate home) Sidelying to sit: Min assist, HOB elevated     Sit to sidelying: Min assist General bed mobility comments: pt initially tried to push with RUE behind him to roll left and corrected to maintain sternal precautions, pt then required min assist to initiate roll; assist to raise torso (even with HOB elevated); assist to raise legs onto bed, although pt agreed that putting HOB down and going down sideways was easier than going straight to his back (as he reports he has done)    Transfers Overall transfer level: Needs assistance Equipment used: Rolling walker (2 wheels) (bariatric) Transfers: Sit to/from Stand Sit to Stand: Min guard           General transfer comment: Pt using momentum to power up.  Consistent cues needed for pushing from his knees with elbows tucked in prior to standing/sitting.    Ambulation/Gait Ambulation/Gait assistance: Min guard Gait Distance (Feet): 150 Feet Assistive device: Rolling walker (2 wheels) Gait Pattern/deviations: Step-through pattern, Decreased stride length, Trunk flexed Gait velocity: decreased     General Gait Details: Cues for posture, precs and proximity to RW. Multiple standing rest breaks. 1/4 DOE. HR 80s SpO2 99% on RA when signal achieved (  noisy signal while pt using RW). Reported incr pain after procedure 6/7   Stairs             Wheelchair Mobility    Modified Rankin (Stroke  Patients Only)       Balance Overall balance assessment: Needs assistance Sitting-balance support: No upper extremity supported, Feet supported Sitting balance-Leahy Scale: Good Sitting balance - Comments: EOB without support   Standing balance support: Bilateral upper extremity supported, During functional activity, Reliant on assistive device for balance Standing balance-Leahy Scale: Fair Standing balance comment: can stand without UB support, RW for gait                            Cognition Arousal/Alertness: Awake/alert Behavior During Therapy: WFL for tasks assessed/performed Overall Cognitive Status: Within Functional Limits for tasks assessed                                 General Comments: slow pacing, hyperverbose;        Exercises      General Comments General comments (skin integrity, edema, etc.): Pt able to verbalize all sternal precautions, however needs cues to adhere to precautions      Pertinent Vitals/Pain Pain Assessment Pain Assessment: Faces Faces Pain Scale: Hurts even more Pain Location: rt and lt chest/axilla Pain Descriptors / Indicators: Grimacing, Discomfort, Guarding Pain Intervention(s): Limited activity within patient's tolerance, Monitored during session    Home Living                          Prior Function            PT Goals (current goals can now be found in the care plan section) Acute Rehab PT Goals Patient Stated Goal: rehab so I can get stronger then home and back to work PT Goal Formulation: With patient Time For Goal Achievement: 02/07/23 Potential to Achieve Goals: Good Progress towards PT goals: Not progressing toward goals - comment (Regressed due to another surgery since last seen)    Frequency    Min 1X/week      PT Plan Current plan remains appropriate    Co-evaluation              AM-PAC PT "6 Clicks" Mobility   Outcome Measure  Help needed turning from your  back to your side while in a flat bed without using bedrails?: A Little Help needed moving from lying on your back to sitting on the side of a flat bed without using bedrails?: A Lot (flat bed/no rails) Help needed moving to and from a bed to a chair (including a wheelchair)?: A Little Help needed standing up from a chair using your arms (e.g., wheelchair or bedside chair)?: A Little Help needed to walk in hospital room?: A Little Help needed climbing 3-5 steps with a railing? : A Lot (based on today's performance) 6 Click Score: 16    End of Session Equipment Utilized During Treatment: Gait belt Activity Tolerance: Patient limited by fatigue;Patient limited by pain Patient left: with call bell/phone within reach;in bed;with nursing/sitter in room (nursing ok'd pt back to bed as he had already been in chair this a.m.) Nurse Communication: Mobility status PT Visit Diagnosis: Other abnormalities of gait and mobility (R26.89);Difficulty in walking, not elsewhere classified (R26.2)     Time: 1012-1050 PT Time Calculation (min) (ACUTE  ONLY): 38 min  Charges:  $Gait Training: 23-37 mins $Self Care/Home Management: 8-22                      Jerolyn Center, PT Acute Rehabilitation Services  Office [phone]    Zena Amos 02/03/2023, 12:21 PM

## 2023-02-04 ENCOUNTER — Inpatient Hospital Stay (HOSPITAL_COMMUNITY)
Admission: RE | Admit: 2023-02-04 | Discharge: 2023-02-11 | DRG: 945 | Disposition: A | Discharge status: Home Health | Payer: Medicare Other | Source: Intra-hospital | Attending: Physical Medicine & Rehabilitation | Admitting: Physical Medicine & Rehabilitation

## 2023-02-04 ENCOUNTER — Inpatient Hospital Stay (HOSPITAL_COMMUNITY): Payer: Medicare Other

## 2023-02-04 ENCOUNTER — Encounter (HOSPITAL_COMMUNITY): Payer: Self-pay | Admitting: Physical Medicine & Rehabilitation

## 2023-02-04 ENCOUNTER — Other Ambulatory Visit: Payer: Self-pay

## 2023-02-04 DIAGNOSIS — Z808 Family history of malignant neoplasm of other organs or systems: Secondary | ICD-10-CM

## 2023-02-04 DIAGNOSIS — I4891 Unspecified atrial fibrillation: Secondary | ICD-10-CM | POA: Diagnosis present

## 2023-02-04 DIAGNOSIS — M1990 Unspecified osteoarthritis, unspecified site: Secondary | ICD-10-CM | POA: Diagnosis present

## 2023-02-04 DIAGNOSIS — Z8619 Personal history of other infectious and parasitic diseases: Secondary | ICD-10-CM | POA: Diagnosis not present

## 2023-02-04 DIAGNOSIS — K5903 Drug induced constipation: Secondary | ICD-10-CM | POA: Diagnosis not present

## 2023-02-04 DIAGNOSIS — E1151 Type 2 diabetes mellitus with diabetic peripheral angiopathy without gangrene: Secondary | ICD-10-CM | POA: Diagnosis present

## 2023-02-04 DIAGNOSIS — D649 Anemia, unspecified: Secondary | ICD-10-CM | POA: Diagnosis not present

## 2023-02-04 DIAGNOSIS — F909 Attention-deficit hyperactivity disorder, unspecified type: Secondary | ICD-10-CM | POA: Diagnosis present

## 2023-02-04 DIAGNOSIS — Z794 Long term (current) use of insulin: Secondary | ICD-10-CM | POA: Diagnosis not present

## 2023-02-04 DIAGNOSIS — S21109A Unspecified open wound of unspecified front wall of thorax without penetration into thoracic cavity, initial encounter: Secondary | ICD-10-CM | POA: Diagnosis not present

## 2023-02-04 DIAGNOSIS — I10 Essential (primary) hypertension: Secondary | ICD-10-CM | POA: Diagnosis present

## 2023-02-04 DIAGNOSIS — F17290 Nicotine dependence, other tobacco product, uncomplicated: Secondary | ICD-10-CM | POA: Diagnosis present

## 2023-02-04 DIAGNOSIS — I25810 Atherosclerosis of coronary artery bypass graft(s) without angina pectoris: Secondary | ICD-10-CM

## 2023-02-04 DIAGNOSIS — R011 Cardiac murmur, unspecified: Secondary | ICD-10-CM | POA: Diagnosis present

## 2023-02-04 DIAGNOSIS — Q2112 Patent foramen ovale: Secondary | ICD-10-CM | POA: Diagnosis not present

## 2023-02-04 DIAGNOSIS — Z951 Presence of aortocoronary bypass graft: Secondary | ICD-10-CM | POA: Diagnosis not present

## 2023-02-04 DIAGNOSIS — J45909 Unspecified asthma, uncomplicated: Secondary | ICD-10-CM | POA: Diagnosis present

## 2023-02-04 DIAGNOSIS — D62 Acute posthemorrhagic anemia: Secondary | ICD-10-CM | POA: Diagnosis present

## 2023-02-04 DIAGNOSIS — K5900 Constipation, unspecified: Secondary | ICD-10-CM | POA: Diagnosis not present

## 2023-02-04 DIAGNOSIS — E871 Hypo-osmolality and hyponatremia: Secondary | ICD-10-CM

## 2023-02-04 DIAGNOSIS — E785 Hyperlipidemia, unspecified: Secondary | ICD-10-CM | POA: Diagnosis present

## 2023-02-04 DIAGNOSIS — Z6841 Body Mass Index (BMI) 40.0 and over, adult: Secondary | ICD-10-CM | POA: Diagnosis not present

## 2023-02-04 DIAGNOSIS — K219 Gastro-esophageal reflux disease without esophagitis: Secondary | ICD-10-CM | POA: Diagnosis present

## 2023-02-04 DIAGNOSIS — R5381 Other malaise: Secondary | ICD-10-CM | POA: Diagnosis present

## 2023-02-04 DIAGNOSIS — Z85828 Personal history of other malignant neoplasm of skin: Secondary | ICD-10-CM

## 2023-02-04 DIAGNOSIS — Z833 Family history of diabetes mellitus: Secondary | ICD-10-CM

## 2023-02-04 DIAGNOSIS — Z7982 Long term (current) use of aspirin: Secondary | ICD-10-CM

## 2023-02-04 DIAGNOSIS — I341 Nonrheumatic mitral (valve) prolapse: Secondary | ICD-10-CM | POA: Diagnosis present

## 2023-02-04 DIAGNOSIS — Z79899 Other long term (current) drug therapy: Secondary | ICD-10-CM | POA: Diagnosis not present

## 2023-02-04 DIAGNOSIS — I25708 Atherosclerosis of coronary artery bypass graft(s), unspecified, with other forms of angina pectoris: Secondary | ICD-10-CM | POA: Diagnosis not present

## 2023-02-04 DIAGNOSIS — I2510 Atherosclerotic heart disease of native coronary artery without angina pectoris: Secondary | ICD-10-CM | POA: Diagnosis present

## 2023-02-04 DIAGNOSIS — Z91040 Latex allergy status: Secondary | ICD-10-CM

## 2023-02-04 DIAGNOSIS — R0789 Other chest pain: Secondary | ICD-10-CM

## 2023-02-04 DIAGNOSIS — F319 Bipolar disorder, unspecified: Secondary | ICD-10-CM | POA: Diagnosis present

## 2023-02-04 DIAGNOSIS — F410 Panic disorder [episodic paroxysmal anxiety] without agoraphobia: Secondary | ICD-10-CM | POA: Diagnosis present

## 2023-02-04 DIAGNOSIS — Z83438 Family history of other disorder of lipoprotein metabolism and other lipidemia: Secondary | ICD-10-CM

## 2023-02-04 DIAGNOSIS — E119 Type 2 diabetes mellitus without complications: Secondary | ICD-10-CM | POA: Diagnosis not present

## 2023-02-04 DIAGNOSIS — Z888 Allergy status to other drugs, medicaments and biological substances status: Secondary | ICD-10-CM

## 2023-02-04 DIAGNOSIS — E1159 Type 2 diabetes mellitus with other circulatory complications: Secondary | ICD-10-CM

## 2023-02-04 DIAGNOSIS — G4730 Sleep apnea, unspecified: Secondary | ICD-10-CM | POA: Diagnosis present

## 2023-02-04 DIAGNOSIS — Z8249 Family history of ischemic heart disease and other diseases of the circulatory system: Secondary | ICD-10-CM

## 2023-02-04 LAB — CBC
HCT: 29.9 % — ABNORMAL LOW (ref 39.0–52.0)
Hemoglobin: 9.1 g/dL — ABNORMAL LOW (ref 13.0–17.0)
MCH: 29.3 pg (ref 26.0–34.0)
MCHC: 30.4 g/dL (ref 30.0–36.0)
MCV: 96.1 fL (ref 80.0–100.0)
Platelets: 341 10*3/uL (ref 150–400)
RBC: 3.11 MIL/uL — ABNORMAL LOW (ref 4.22–5.81)
RDW: 14.7 % (ref 11.5–15.5)
WBC: 4.6 10*3/uL (ref 4.0–10.5)
nRBC: 0 % (ref 0.0–0.2)

## 2023-02-04 LAB — BASIC METABOLIC PANEL
Anion gap: 6 (ref 5–15)
BUN: 20 mg/dL (ref 8–23)
CO2: 26 mmol/L (ref 22–32)
Calcium: 8 mg/dL — ABNORMAL LOW (ref 8.9–10.3)
Chloride: 102 mmol/L (ref 98–111)
Creatinine, Ser: 1.15 mg/dL (ref 0.61–1.24)
GFR, Estimated: >60 mL/min (ref >60)
Glucose, Bld: 115 mg/dL — ABNORMAL HIGH (ref 70–99)
Potassium: 3.8 mmol/L (ref 3.5–5.1)
Sodium: 134 mmol/L — ABNORMAL LOW (ref 135–145)

## 2023-02-04 LAB — GLUCOSE, CAPILLARY
Glucose-Capillary: 116 mg/dL — ABNORMAL HIGH (ref 70–99)
Glucose-Capillary: 128 mg/dL — ABNORMAL HIGH (ref 70–99)
Glucose-Capillary: 102 mg/dL — ABNORMAL HIGH (ref 70–99)

## 2023-02-04 MED ORDER — FUROSEMIDE 40 MG PO TABS: 40.0000 mg | ORAL_TABLET | Freq: Every day | ORAL | 1 refills | Status: DC

## 2023-02-04 MED ORDER — LAMOTRIGINE 100 MG PO TABS
100.0000 mg | ORAL_TABLET | Freq: Every day | ORAL | Status: DC
  Administered 2023-02-05 – 2023-02-11 (×7): 100 mg via ORAL
  Filled 2023-02-04 (×7): qty 1

## 2023-02-04 MED ORDER — TRAZODONE HCL 50 MG PO TABS
50.0000 mg | ORAL_TABLET | Freq: Every evening | ORAL | Status: DC | PRN
  Filled 2023-02-04: qty 1

## 2023-02-04 MED ORDER — ONDANSETRON HCL 4 MG/2ML IJ SOLN: 4.0000 mg | Freq: Four times a day (QID) | INTRAMUSCULAR | Status: DC | PRN

## 2023-02-04 MED ORDER — ZINC SULFATE 220 (50 ZN) MG PO CAPS
220.0000 mg | ORAL_CAPSULE | Freq: Every day | ORAL | Status: DC
  Administered 2023-02-05 – 2023-02-11 (×7): 220 mg via ORAL
  Filled 2023-02-04 (×7): qty 1

## 2023-02-04 MED ORDER — ALBUTEROL SULFATE (2.5 MG/3ML) 0.083% IN NEBU: 2.5000 mg | INHALATION_SOLUTION | RESPIRATORY_TRACT | Status: DC | PRN

## 2023-02-04 MED ORDER — FUROSEMIDE 40 MG PO TABS: 40.0000 mg | ORAL_TABLET | Freq: Every day | ORAL | Status: DC

## 2023-02-04 MED ORDER — HYDROCOD POLI-CHLORPHE POLI ER 10-8 MG/5ML PO SUER
5.0000 mL | Freq: Two times a day (BID) | ORAL | Status: AC
  Administered 2023-02-04 – 2023-02-10 (×13): 5 mL via ORAL
  Filled 2023-02-04 (×13): qty 5

## 2023-02-04 MED ORDER — METOPROLOL TARTRATE 25 MG PO TABS
37.5000 mg | ORAL_TABLET | Freq: Two times a day (BID) | ORAL | Status: DC
  Administered 2023-02-04 – 2023-02-11 (×14): 37.5 mg via ORAL
  Filled 2023-02-04 (×14): qty 1

## 2023-02-04 MED ORDER — AMIODARONE HCL 200 MG PO TABS: 200.0000 mg | ORAL_TABLET | Freq: Every day | ORAL | 1 refills | Status: DC

## 2023-02-04 MED ORDER — ONDANSETRON HCL 4 MG PO TABS: 4.0000 mg | ORAL_TABLET | Freq: Four times a day (QID) | ORAL | Status: DC | PRN

## 2023-02-04 MED ORDER — NICOTINE 14 MG/24HR TD PT24
14.0000 mg | MEDICATED_PATCH | Freq: Every day | TRANSDERMAL | Status: DC
  Administered 2023-02-05 – 2023-02-11 (×7): 14 mg via TRANSDERMAL
  Filled 2023-02-04 (×7): qty 1

## 2023-02-04 MED ORDER — OXYCODONE HCL 5 MG PO TABS: 5.0000 mg | ORAL_TABLET | Freq: Four times a day (QID) | ORAL | 0 refills | Status: DC | PRN

## 2023-02-04 MED ORDER — GUAIFENESIN ER 600 MG PO TB12: 1200.0000 mg | ORAL_TABLET | Freq: Two times a day (BID) | ORAL | Status: DC | PRN

## 2023-02-04 MED ORDER — TRAMADOL HCL 50 MG PO TABS
50.0000 mg | ORAL_TABLET | ORAL | Status: DC | PRN
  Filled 2023-02-04: qty 2

## 2023-02-04 MED ORDER — INSULIN ASPART 100 UNIT/ML IJ SOLN
0.0000 [IU] | Freq: Three times a day (TID) | INTRAMUSCULAR | Status: DC
  Administered 2023-02-05: 2 [IU] via SUBCUTANEOUS

## 2023-02-04 MED ORDER — POTASSIUM CHLORIDE CRYS ER 20 MEQ PO TBCR
20.0000 meq | EXTENDED_RELEASE_TABLET | Freq: Two times a day (BID) | ORAL | Status: AC
  Administered 2023-02-04: 20 meq via ORAL
  Filled 2023-02-04: qty 1

## 2023-02-04 MED ORDER — BUDESONIDE 0.5 MG/2ML IN SUSP
0.5000 mg | Freq: Two times a day (BID) | RESPIRATORY_TRACT | Status: DC
  Administered 2023-02-04 – 2023-02-11 (×13): 0.5 mg via RESPIRATORY_TRACT
  Filled 2023-02-04 (×15): qty 2

## 2023-02-04 MED ORDER — ACETAMINOPHEN 325 MG PO TABS
325.0000 mg | ORAL_TABLET | ORAL | Status: DC | PRN
  Administered 2023-02-05: 650 mg via ORAL
  Filled 2023-02-04: qty 2

## 2023-02-04 MED ORDER — AMIODARONE HCL 200 MG PO TABS
200.0000 mg | ORAL_TABLET | Freq: Every day | ORAL | Status: DC
  Administered 2023-02-05 – 2023-02-11 (×7): 200 mg via ORAL
  Filled 2023-02-04 (×7): qty 1

## 2023-02-04 MED ORDER — OXYCODONE HCL 5 MG PO TABS
10.0000 mg | ORAL_TABLET | ORAL | Status: DC | PRN
  Administered 2023-02-04 – 2023-02-06 (×8): 10 mg via ORAL
  Filled 2023-02-04 (×8): qty 2

## 2023-02-04 MED ORDER — FUROSEMIDE 10 MG/ML IJ SOLN
40.0000 mg | Freq: Once | INTRAMUSCULAR | Status: AC
  Administered 2023-02-04: 40 mg via INTRAVENOUS
  Filled 2023-02-04: qty 4

## 2023-02-04 MED ORDER — ENOXAPARIN SODIUM 40 MG/0.4ML IJ SOSY: 40.0000 mg | PREFILLED_SYRINGE | INTRAMUSCULAR | Status: DC

## 2023-02-04 MED ORDER — MUSCLE RUB 10-15 % EX CREA: TOPICAL_CREAM | CUTANEOUS | Status: DC | PRN

## 2023-02-04 MED ORDER — GUAIFENESIN ER 600 MG PO TB12
1200.0000 mg | ORAL_TABLET | Freq: Two times a day (BID) | ORAL | Status: DC
  Administered 2023-02-04 – 2023-02-10 (×12): 1200 mg via ORAL
  Filled 2023-02-04 (×12): qty 2

## 2023-02-04 MED ORDER — JUVEN PO PACK
1.0000 | PACK | Freq: Two times a day (BID) | ORAL | Status: DC
  Administered 2023-02-05 – 2023-02-10 (×10): 1 via ORAL
  Filled 2023-02-04 (×10): qty 1

## 2023-02-04 MED ORDER — PANTOPRAZOLE SODIUM 40 MG PO TBEC
40.0000 mg | DELAYED_RELEASE_TABLET | Freq: Every day | ORAL | Status: DC
  Administered 2023-02-05 – 2023-02-11 (×7): 40 mg via ORAL
  Filled 2023-02-04 (×7): qty 1

## 2023-02-04 MED ORDER — NICOTINE 14 MG/24HR TD PT24: 14.0000 mg | MEDICATED_PATCH | Freq: Every day | TRANSDERMAL | 0 refills | Status: DC

## 2023-02-04 MED ORDER — DIPHENHYDRAMINE HCL 25 MG PO CAPS: 25.0000 mg | ORAL_CAPSULE | Freq: Four times a day (QID) | ORAL | Status: DC | PRN

## 2023-02-04 MED ORDER — GUAIFENESIN-DM 100-10 MG/5ML PO SYRP: 5.0000 mL | ORAL_SOLUTION | ORAL | Status: DC | PRN

## 2023-02-04 MED ORDER — HYDROCOD POLI-CHLORPHE POLI ER 10-8 MG/5ML PO SUER: 5.0000 mL | Freq: Two times a day (BID) | ORAL | 0 refills | Status: DC

## 2023-02-04 MED ORDER — POLYETHYLENE GLYCOL 3350 17 G PO PACK: 17.0000 g | PACK | Freq: Every day | ORAL | 0 refills | Status: DC | PRN

## 2023-02-04 MED ORDER — FLEET ENEMA 7-19 GM/118ML RE ENEM: 1.0000 | ENEMA | Freq: Once | RECTAL | Status: DC | PRN

## 2023-02-04 MED ORDER — POTASSIUM CHLORIDE CRYS ER 20 MEQ PO TBCR: 20.0000 meq | EXTENDED_RELEASE_TABLET | Freq: Every day | ORAL | 1 refills | Status: DC

## 2023-02-04 MED ORDER — ASPIRIN 325 MG PO TBEC
325.0000 mg | DELAYED_RELEASE_TABLET | Freq: Every day | ORAL | Status: DC
  Administered 2023-02-05 – 2023-02-11 (×7): 325 mg via ORAL
  Filled 2023-02-04 (×7): qty 1

## 2023-02-04 MED ORDER — GABAPENTIN 100 MG PO CAPS: 200.0000 mg | ORAL_CAPSULE | Freq: Two times a day (BID) | ORAL | 1 refills | Status: DC

## 2023-02-04 MED ORDER — GABAPENTIN 100 MG PO CAPS
200.0000 mg | ORAL_CAPSULE | Freq: Two times a day (BID) | ORAL | Status: DC
  Administered 2023-02-04 – 2023-02-11 (×14): 200 mg via ORAL
  Filled 2023-02-04 (×14): qty 2

## 2023-02-04 MED ORDER — ROSUVASTATIN CALCIUM 5 MG PO TABS
10.0000 mg | ORAL_TABLET | Freq: Every day | ORAL | Status: DC
  Administered 2023-02-05 – 2023-02-11 (×7): 10 mg via ORAL
  Filled 2023-02-04 (×7): qty 2

## 2023-02-04 MED ORDER — POLYETHYLENE GLYCOL 3350 17 G PO PACK
17.0000 g | PACK | Freq: Every day | ORAL | Status: DC | PRN
  Administered 2023-02-06: 17 g via ORAL
  Filled 2023-02-04: qty 1

## 2023-02-04 MED ORDER — METHOCARBAMOL 500 MG PO TABS
500.0000 mg | ORAL_TABLET | Freq: Four times a day (QID) | ORAL | Status: DC | PRN
  Administered 2023-02-05: 500 mg via ORAL
  Filled 2023-02-04 (×2): qty 1

## 2023-02-04 MED ORDER — ALUM & MAG HYDROXIDE-SIMETH 200-200-20 MG/5ML PO SUSP: 30.0000 mL | ORAL | Status: DC | PRN

## 2023-02-04 MED ORDER — BISACODYL 5 MG PO TBEC: 5.0000 mg | DELAYED_RELEASE_TABLET | Freq: Every day | ORAL | Status: DC | PRN

## 2023-02-04 MED ORDER — BUDESONIDE 0.5 MG/2ML IN SUSP: 0.5000 mg | Freq: Two times a day (BID) | RESPIRATORY_TRACT | 30 refills | Status: DC | PRN

## 2023-02-04 MED ORDER — POTASSIUM CHLORIDE CRYS ER 20 MEQ PO TBCR
20.0000 meq | EXTENDED_RELEASE_TABLET | Freq: Two times a day (BID) | ORAL | Status: DC
  Administered 2023-02-04: 20 meq via ORAL
  Filled 2023-02-04: qty 1

## 2023-02-04 MED ORDER — ASPIRIN 325 MG PO TBEC: 325.0000 mg | DELAYED_RELEASE_TABLET | Freq: Every day | ORAL | Status: DC

## 2023-02-04 MED ORDER — ALBUTEROL SULFATE (2.5 MG/3ML) 0.083% IN NEBU: 2.5000 mg | INHALATION_SOLUTION | RESPIRATORY_TRACT | 30 refills | Status: DC | PRN

## 2023-02-04 MED ORDER — ENOXAPARIN SODIUM 40 MG/0.4ML IJ SOSY
40.0000 mg | PREFILLED_SYRINGE | Freq: Every day | INTRAMUSCULAR | Status: DC
  Administered 2023-02-05 – 2023-02-11 (×7): 40 mg via SUBCUTANEOUS
  Filled 2023-02-04 (×7): qty 0.4

## 2023-02-04 MED ORDER — DOCUSATE SODIUM 100 MG PO CAPS
200.0000 mg | ORAL_CAPSULE | Freq: Every day | ORAL | Status: DC
  Administered 2023-02-04 – 2023-02-11 (×8): 200 mg via ORAL
  Filled 2023-02-04 (×8): qty 2

## 2023-02-04 MED ORDER — CALCIUM CARBONATE ANTACID 500 MG PO CHEW: 400.0000 mg | CHEWABLE_TABLET | Freq: Two times a day (BID) | ORAL | Status: DC | PRN

## 2023-02-04 MED ORDER — FUROSEMIDE 40 MG PO TABS
40.0000 mg | ORAL_TABLET | Freq: Every day | ORAL | Status: DC
  Administered 2023-02-05 – 2023-02-11 (×7): 40 mg via ORAL
  Filled 2023-02-04 (×7): qty 1

## 2023-02-04 MED ORDER — METOPROLOL TARTRATE 37.5 MG PO TABS: 37.5000 mg | ORAL_TABLET | Freq: Two times a day (BID) | ORAL | 1 refills | Status: DC

## 2023-02-04 NOTE — H&P (Addendum)
Physical Medicine and Rehabilitation Admission H&P   CC: Functional deficits secondary to CAD, s/p CABG with sternal dehiscence  HPI: Bryan Brock is a 67 year old male who was evaluated earlier this year by Dr. Lavinia Sharps concerning coronary artery disease.  He underwent extensive workup and was found to have multivessel disease consisting of serial right coronary artery lesions, high-grade left circumflex disease, are FFR positive LAD D disease and moderate second diagonal disease.  He was counseled regarding recommendations for outpatient cardiothoracic surgical evaluation.  Echocardiogram showed normal left ventricular systolic function and no other valvular abnormality.  He was counseled regarding recommendations for coronary bypass graft surgery is the best treatment for resolution of his exertional fatigue and shortness of breath and preserve myocardium.  On 5/09, he underwent coronary artery bypass grafting x 4 with endoscopic vein harvest from right and left legs.  He was transferred to the ICU for observation and extubated on 5/09.  Required blood pressure support and was not diuresed.  He was transferred to stepdown unit on 5/11.  He developed a cough and was not using his pillow to stabilize his chest.  The patient was strongly advised regarding risk of sternal dehiscence is higher with morbid obesity.  CT chest was obtained to rule out sternal dehiscence.  On 5/14,  he was returned to the operating room and underwent sternal rewiring.  He was noted to be in atrial fibrillation with controlled rate in the 80s.  Amiodarone was switched to intravenous administration.  Was extubated on 5/15. Chest tubes discontinued 5/20. Some drainage from sternal incision prompted CT examination on 5/22. Chest CT showed displacement of 3rd, 4th and 5th ribs with similar air and fluid levels around the site. Continued dressing changes and Keflex prophylactically .  Diuresed.  He underwent left pleurocentesis on 5/26.   Repeat chest CT 5/26 failed significant anterior chest wall collection on the left and complete dehiscence of the left ribs from the sternum.  He was returned to the operating room on 5/27 and underwent I&D of peristernal fluid collection and placement of wound VAC.  Continued on cefepime and vancomycin and follow-up wound cultures.  Plastic surgery consultation was obtained on 5/29 and he was returned to the operating room on 5/30 and underwent excision of sternal wound skin and soft tissue and intermediate closure of the wound with placement of wound VAC by Dr. Ulice Bold.  He underwent this procedure again on 6/05.  On 6/07 he underwent right pectoralis major muscle advancement flap and placement of wound VAC by Dr. Ulice Bold.  He is voiding well and tolerating a carb modified diet.he reports his pain is controlled with current regimen of gabapentin, as needed oxycodone, as needed tramadol.  Reports shortness of breath with activities.  The patient requires inpatient medicine and rehabilitation evaluations and services for ongoing dysfunction secondary to coronary artery disease status post CABG with sternal dehiscence, pectoralis major muscle flap.  Works part-time in [employer]. Lives with wife who works full-time. He is a non-smoker but does use smokeless tobacco products.         Review of Systems  Constitutional:  Negative for chills and fever.  HENT:  Negative for congestion and sore throat.   Eyes:  Negative for blurred vision and double vision.  Respiratory:  Positive for cough and shortness of breath.   Cardiovascular:  Positive for chest pain. Negative for palpitations.  Gastrointestinal:  Negative for nausea.  Genitourinary:  Negative for dysuria and urgency.  Musculoskeletal:  Positive for joint  pain. Negative for back pain and neck pain.  Neurological:  Positive for weakness. Negative for dizziness and headaches.  Psychiatric/Behavioral:  Negative for depression. The patient is  not nervous/anxious.    Past Medical History:  Diagnosis Date   Acne    ADHD (attention deficit hyperactivity disorder)    Arthritis    Asthma    Bipolar 1 disorder (HCC)    Cancer (HCC)    basal cell cancer removed from back   Cataract    Coronary artery disease    Depression    Dyspnea    Dysrhythmia    PVCs   GERD (gastroesophageal reflux disease)    Heart murmur    Hepatitis    remote hx Hepatitis A (caused by food contaminant in childhood)   History of hiatal hernia    Hyperlipidemia    Hypertension    Mitral valve prolapse    Panic attacks    Peripheral vascular disease (HCC)    PFO (patent foramen ovale)    ? small PFO per echo   PONV (postoperative nausea and vomiting)    Pre-diabetes    Sleep apnea    Past Surgical History:  Procedure Laterality Date   APPLICATION OF WOUND VAC N/A 01/20/2023   Procedure: APPLICATION OF WOUND VAC;  Surgeon: Lovett Sox, MD;  Location: [facility] OR;  Service: Thoracic;  Laterality: N/A;   APPLICATION OF WOUND VAC N/A 01/23/2023   Procedure: APPLICATION OF WOUND VAC;  Surgeon: Peggye Form, DO;  Location: [facility] OR;  Service: Plastics;  Laterality: N/A;   APPLICATION OF WOUND VAC N/A 01/29/2023   Procedure: APPLICATION OF WOUND VAC;  Surgeon: Peggye Form, DO;  Location: [facility] OR;  Service: Plastics;  Laterality: N/A;   CORONARY ARTERY BYPASS GRAFT N/A 01/02/2023   Procedure: CORONARY ARTERY BYPASS GRAFTING (CABG) TIMES FOUR USING THE LEFT INTERNAL MAMMARY ARTERY (LIMA) AND BILATERAL GREATER SAPHENOUS VEIN ARTERIES;  Surgeon: [name], MD;  Location: [facility] OR;  Service: Open Heart Surgery;  Laterality: N/A;  Open median sternotomy   CORONARY PRESSURE/FFR STUDY N/A 11/21/2022   Procedure: INTRAVASCULAR PRESSURE WIRE/FFR STUDY;  Surgeon: Orbie Pyo, MD;  Location: [facility];  Service: Cardiovascular;  Laterality: N/A;   HYDROCELE EXCISION Bilateral 03/22/2022   Procedure: HYDROCELECTOMY ADULT;  Surgeon: Noel Christmas, MD;  Location: [facility];  Service: Urology;  Laterality: Bilateral;  2 HRS   INCISION AND DRAINAGE OF WOUND N/A 01/29/2023   Procedure: Sternal wound irrigation and debridement, placement of myriad and wound VAC change;  Surgeon: Peggye Form, DO;  Location: [facility];  Service: Plastics;  Laterality: N/A;   INCISION AND DRAINAGE OF WOUND N/A 01/31/2023   Procedure: IRRIGATION AND DEBRIDEMENT OF STERNAL WOUND;  Surgeon: Peggye Form, DO;  Location: [facility];  Service: Plastics;  Laterality: N/A;   LEFT HEART CATH AND CORONARY ANGIOGRAPHY N/A 11/21/2022   Procedure: LEFT HEART CATH AND CORONARY ANGIOGRAPHY;  Surgeon: Orbie Pyo, MD;  Location: [facility];  Service: Cardiovascular;  Laterality: N/A;   MOHS SURGERY  1990   PECTORALIS FLAP Right 01/31/2023   Procedure: RIGHT PECTORALIS FLAP;  Surgeon: Peggye Form, DO;  Location: [facility];  Service: Plastics;  Laterality: Right;   STERNAL INCISION RECLOSURE N/A 01/07/2023   Procedure: STERNAL REWIRING;  Surgeon: [name], MD;  Location: [facility];  Service: Thoracic;  Laterality: N/A;   STERNAL WOUND DEBRIDEMENT N/A 01/20/2023   Procedure: DRAIN STERNAL INFECTION;  Surgeon: Lovett Sox, MD;  Location: [facility] OR;  Service: Thoracic;  Laterality: N/A;   STERNAL WOUND DEBRIDEMENT N/A 01/23/2023   Procedure: Excision of sternal wound with Myriad;  Surgeon: Peggye Form, DO;  Location: [facility];  Service: Plastics;  Laterality: N/A;   STRABISMUS SURGERY Left    TEE WITHOUT CARDIOVERSION N/A 01/02/2023   Procedure: TRANSESOPHAGEAL ECHOCARDIOGRAM;  Surgeon: [name], MD;  Location: [facility] OR;  Service: Open Heart Surgery;  Laterality: N/A;   TONSILLECTOMY AND ADENOIDECTOMY     WISDOM TOOTH EXTRACTION     Family History  Problem Relation Age of Onset   Heart disease Mother    Diabetes Mother    Obesity Mother    Hyperlipidemia Mother    Cancer Father        skin cancer   Heart disease Father    Hyperlipidemia  Father    Colon cancer Neg Hx    Esophageal cancer Neg Hx    Rectal cancer Neg Hx    Stomach cancer Neg Hx    Social History:  reports that he has never smoked. His smokeless tobacco use includes snuff. He reports current alcohol use of about 1.0 - 2.0 standard drink of alcohol per week. He reports that he does not use drugs. Allergies:  Allergies  Allergen Reactions   Latex Dermatitis   Lisinopril Cough   Medications Prior to Admission  Medication Sig Dispense Refill   albuterol (PROVENTIL) (2.5 MG/3ML) 0.083% nebulizer solution Take 3 mLs (2.5 mg total) by nebulization every 4 (four) hours as needed for wheezing or shortness of breath. 3 mL 30   albuterol (VENTOLIN HFA) 108 (90 Base) MCG/ACT inhaler Inhale 1 puff into the lungs every 6 (six) hours as needed for wheezing or shortness of breath.     amiodarone (PACERONE) 200 MG tablet Take 1 tablet (200 mg total) by mouth daily. 30 tablet 1   APPLE CIDER VINEGAR PO Take 15 mLs by mouth every Monday, Wednesday, and Friday.     Ascorbic Acid (VITAMIN C) 1000 MG tablet Take 3,000 mg by mouth daily.     aspirin EC 325 MG tablet Take 1 tablet (325 mg total) by mouth daily.     budesonide (PULMICORT) 0.5 MG/2ML nebulizer solution Take 2 mLs (0.5 mg total) by nebulization 2 (two) times daily as needed (SOB, wheezing). 2 mL 30   calcium elemental as carbonate (TUMS ULTRA 1000) 400 MG chewable tablet Chew 2,000 mg by mouth 3 (three) times daily as needed for heartburn.     chlorpheniramine-HYDROcodone (TUSSIONEX) 10-8 MG/5ML Take 5 mLs by mouth every 12 (twelve) hours. 30 mL 0   Cholecalciferol (DIALYVITE VITAMIN D 5000) 125 MCG (5000 UT) capsule Take 20,000 Units by mouth daily.     Coenzyme Q10 (COQ-10) 100 MG CAPS Take 100 mg by mouth daily.     diclofenac Sodium (VOLTAREN) 1 % GEL Apply 1 Application topically daily as needed (pain).     Flaxseed, Linseed, (FLAX SEED OIL) 1000 MG CAPS Take 1,000 mg by mouth daily.     folic acid (FOLVITE) 800  MCG tablet Take 800 mcg by mouth daily.     [START ON 02/05/2023] furosemide (LASIX) 40 MG tablet Take 1 tablet (40 mg total) by mouth daily. 30 tablet 1   gabapentin (NEURONTIN) 100 MG capsule Take 2 capsules (200 mg total) by mouth 2 (two) times daily. 60 capsule 1   guaiFENesin (MUCINEX) 600 MG 12 hr tablet Take 2 tablets (1,200 mg  total) by mouth 2 (two) times daily as needed for to loosen phlegm or cough.     lamoTRIgine (LAMICTAL) 100 MG tablet Take 100 mg by mouth daily.     Liniments (BLUE-EMU SUPER STRENGTH EX) Apply 1 Application topically daily as needed (pain).     magnesium oxide (MAG-OX) 400 MG tablet Take 400 mg by mouth daily.     Metoprolol Tartrate 37.5 MG TABS Take 1 tablet (37.5 mg total) by mouth 2 (two) times daily. 60 tablet 1   Multiple Vitamins-Minerals (MULTIVITAMIN WITH MINERALS) tablet Take 1 tablet by mouth daily.     nicotine (NICODERM CQ - DOSED IN MG/24 HOURS) 14 mg/24hr patch Place 1 patch (14 mg total) onto the skin daily. 28 patch 0   Omega 3 1000 MG CAPS Take 1,000 mg by mouth daily.     OVER THE COUNTER MEDICATION Nano CBD OIL: Pt takes 20 drops in the morning and 20 drops in the evening.     oxyCODONE (OXY IR/ROXICODONE) 5 MG immediate release tablet Take 1 tablet (5 mg total) by mouth every 6 (six) hours as needed for severe pain. 28 tablet 0   polyethylene glycol (MIRALAX) 17 g packet Take 17 g by mouth daily as needed for mild constipation.  0   potassium chloride SA (KLOR-CON M) 20 MEQ tablet Take 1 tablet (20 mEq total) by mouth daily. 30 tablet 1   rosuvastatin (CRESTOR) 10 MG tablet Take 1 tablet (10 mg total) by mouth daily. 90 tablet 3   sildenafil (VIAGRA) 100 MG tablet Take 100 mg by mouth daily as needed for erectile dysfunction.     zinc gluconate 50 MG tablet Take 50 mg by mouth daily.      Home: Home Living Family/patient expects to be discharged to:: Private residence Living Arrangements: Spouse/significant other Available Help at  Discharge: Family, Available 24 hours/day Type of Home: House Home Access: Stairs to enter [employer] of Steps: 5 Home Layout: Two level, Able to live on main level with bedroom/bathroom Bathroom Shower/Tub: Engineer, manufacturing systems: Handicapped height Bathroom Accessibility: Yes Home Equipment: Shower seat Additional Comments: significant other was ordering RW and shower seat   Functional History: Prior Function Prior Level of Function : Independent/Modified Independent   Functional Status:  Mobility: Bed Mobility Overal bed mobility: Needs Assistance Bed Mobility: Rolling, Sidelying to Sit Rolling: Min assist (to left to simulate home) Sidelying to sit: Min assist, HOB elevated Sit to supine: Min assist Sit to sidelying: Min assist General bed mobility comments: pt initially tried to push with RUE behind him to roll left and corrected to maintain sternal precautions, pt then required min assist to initiate roll; assist to raise torso (even with HOB elevated); assist to raise legs onto bed, although pt agreed that putting HOB down and going down sideways was easier than going straight to his back (as he reports he has done) Transfers Overall transfer level: Needs assistance Equipment used: Rolling walker (2 wheels) (bariatric) Transfers: Sit to/from Stand Sit to Stand: Min guard Bed to/from chair/wheelchair/BSC transfer type:: Step pivot Step pivot transfers: Min guard General transfer comment: Pt using momentum to power up.  Consistent cues needed for pushing from his knees with elbows tucked in prior to standing/sitting. Ambulation/Gait Ambulation/Gait assistance: Min guard Gait Distance (Feet): 150 Feet Assistive device: Rolling walker (2 wheels) Gait Pattern/deviations: Step-through pattern, Decreased stride length, Trunk flexed General Gait Details: Cues for posture, precs and proximity to RW. Multiple standing rest breaks. 1/4 DOE.  HR 80s SpO2 99% on RA  when signal achieved (noisy signal while pt using RW). Reported incr pain after procedure 6/7 Gait velocity: decreased Gait velocity interpretation: <1.31 ft/sec, indicative of household ambulator Stairs: Yes Stairs assistance: Min assist Stair Management: Backwards, With walker Number of Stairs: 1 (x3) General stair comments:  (pt defers today due to increased fatigue after gait trial)   ADL: ADL Overall ADL's : Needs assistance/impaired Grooming: Wash/dry hands, Wash/dry face, Min guard, Standing Grooming Details (indicate cue type and reason): cues to maintain sternal prec Upper Body Dressing : Minimal assistance, Sitting Lower Body Dressing: Moderate assistance Lower Body Dressing Details (indicate cue type and reason): AE training with reacher, sock aide, LH shoehorn, LH sponge, dressing stick, and sock aide Toilet Transfer: Minimal assistance, Min guard, Rolling walker (2 wheels), BSC/3in1 Toilet Transfer Details (indicate cue type and reason): cues for technique Toileting- Clothing Manipulation and Hygiene: Moderate assistance Toileting - Clothing Manipulation Details (indicate cue type and reason): clothing mgt Functional mobility during ADLs: Minimal assistance, Min guard, Rolling walker (2 wheels) General ADL Comments: reviewed A/E use for LB ADLs   Cognition: Cognition Overall Cognitive Status: Within Functional Limits for tasks assessed Orientation Level: Oriented X4 Cognition Arousal/Alertness: Awake/alert Behavior During Therapy: WFL for tasks assessed/performed Overall Cognitive Status: Within Functional Limits for tasks assessed General Comments: slow pacing, hyperverbose;      Physical Exam: Blood pressure 135/67, pulse 77, temperature 98.3 F (36.8 C), temperature source Oral, resp. rate 18, height 5\' 11"  (1.803 m), weight (!) 144 kg, SpO2 97 %.   General: No apparent distress, lying in bed appears comfortable HEENT: Head is normocephalic, atraumatic,  PERRLA, EOMI, sclera anicteric, oral mucosa pink and moist, dentition intact, ext ear canals clear,  Neck: Supple without JVD or lymphadenopathy Heart: Reg rate and rhythm Chest: CTA bilaterally without wheezes, rales, or rhonchi; no distress.  Chest wall tenderness present Abdomen: Soft, non-tender, non-distended, bowel sounds positive.  Small umbilical hernia Extremities: Edema bilateral lower extremities present 1+ Ecchymosis noted in bilateral lower extremities Psych: Pt's affect is appropriate. Pt is cooperative.  He is very pleasant Skin: Wound VAC in place with good seal. Binder in place. Drain x2 serosanguineous drainage.  Small left abdominal dressing clean and dry, Neuro: Alert oriented x 4, follows commands, cranial nerves II through XII grossly intact, no speech or language deficits noted Sensation intact to light touch in all 4 extremities though this is altered in his left distal foot Strength 5 out of 5 in bilateral lower extremities No focal motor deficits noted in bilateral upper extremities, testing limited by sternal precautions No ataxia or dysmetria noted Musculoskeletal:  Tenderness over dorsal left foot-patient reports this is due to chronic ganglion cyst  IV left upper extremity looks okay  Results for orders placed or performed during the hospital encounter of 01/02/23 (from the past 48 hour(s))  Glucose, capillary     Status: Abnormal   Collection Time: 02/02/23  5:32 PM  Result Value Ref Range   Glucose-Capillary 132 (H) 70 - 99 mg/dL    Comment: Glucose reference range applies only to samples taken after fasting for at least 8 hours.  Glucose, capillary     Status: Abnormal   Collection Time: 02/02/23  9:08 PM  Result Value Ref Range   Glucose-Capillary 128 (H) 70 - 99 mg/dL    Comment: Glucose reference range applies only to samples taken after fasting for at least 8 hours.   Comment 1 Notify RN  Comment 2 Document in Chart   CBC     Status: Abnormal    Collection Time: 02/03/23  2:02 AM  Result Value Ref Range   WBC 4.4 4.0 - 10.5 K/uL   RBC 2.90 (L) 4.22 - 5.81 MIL/uL   Hemoglobin 8.5 (L) 13.0 - 17.0 g/dL   HCT 82.9 (L) 56.2 - 13.0 %   MCV 98.3 80.0 - 100.0 fL   MCH 29.3 26.0 - 34.0 pg   MCHC 29.8 (L) 30.0 - 36.0 g/dL   RDW 86.5 78.4 - 69.6 %   Platelets 313 150 - 400 K/uL   nRBC 0.0 0.0 - 0.2 %    Comment: Performed at [facility], [address] [place], [place] [address]  Glucose, capillary     Status: Abnormal   Collection Time: 02/03/23  5:45 AM  Result Value Ref Range   Glucose-Capillary 122 (H) 70 - 99 mg/dL    Comment: Glucose reference range applies only to samples taken after fasting for at least 8 hours.   Comment 1 Notify RN    Comment 2 Document in Chart   Glucose, capillary     Status: Abnormal   Collection Time: 02/03/23 11:56 AM  Result Value Ref Range   Glucose-Capillary 126 (H) 70 - 99 mg/dL    Comment: Glucose reference range applies only to samples taken after fasting for at least 8 hours.  Glucose, capillary     Status: Abnormal   Collection Time: 02/03/23  5:20 PM  Result Value Ref Range   Glucose-Capillary 100 (H) 70 - 99 mg/dL    Comment: Glucose reference range applies only to samples taken after fasting for at least 8 hours.  Glucose, capillary     Status: None   Collection Time: 02/03/23  9:14 PM  Result Value Ref Range   Glucose-Capillary 99 70 - 99 mg/dL    Comment: Glucose reference range applies only to samples taken after fasting for at least 8 hours.   Comment 1 Notify RN    Comment 2 Document in Chart   CBC     Status: Abnormal   Collection Time: 02/04/23  1:31 AM  Result Value Ref Range   WBC 4.6 4.0 - 10.5 K/uL   RBC 3.11 (L) 4.22 - 5.81 MIL/uL   Hemoglobin 9.1 (L) 13.0 - 17.0 g/dL   HCT 41.3 (L) 24.4 - 01.0 %   MCV 96.1 80.0 - 100.0 fL   MCH 29.3 26.0 - 34.0 pg   MCHC 30.4 30.0 - 36.0 g/dL   RDW 27.2 53.6 - 64.4 %   Platelets 341 150 - 400 K/uL   nRBC 0.0 0.0 - 0.2 %     Comment: Performed at [facility], [address], [place], [place] [address]  Basic metabolic panel     Status: Abnormal   Collection Time: 02/04/23  1:31 AM  Result Value Ref Range   Sodium 134 (L) 135 - 145 mmol/L   Potassium 3.8 3.5 - 5.1 mmol/L   Chloride 102 98 - 111 mmol/L   CO2 26 22 - 32 mmol/L   Glucose, Bld 115 (H) 70 - 99 mg/dL    Comment: Glucose reference range applies only to samples taken after fasting for at least 8 hours.   BUN 20 8 - 23 mg/dL   Creatinine, Ser 2.59 0.61 - 1.24 mg/dL   Calcium 8.0 (L) 8.9 - 10.3 mg/dL   GFR, Estimated >56 >38 mL/min  Comment: (NOTE) Calculated using the CKD-EPI Creatinine Equation (2021)    Anion gap 6 5 - 15    Comment: Performed at [facility], [address], [place], [place] [address]  Glucose, capillary     Status: Abnormal   Collection Time: 02/04/23  6:07 AM  Result Value Ref Range   Glucose-Capillary 116 (H) 70 - 99 mg/dL    Comment: Glucose reference range applies only to samples taken after fasting for at least 8 hours.  Glucose, capillary     Status: Abnormal   Collection Time: 02/04/23 11:10 AM  Result Value Ref Range   Glucose-Capillary 128 (H) 70 - 99 mg/dL    Comment: Glucose reference range applies only to samples taken after fasting for at least 8 hours.   DG CHEST PORT 1 VIEW  Result Date: 02/04/2023 CLINICAL DATA:  Shortness of breath EXAM: PORTABLE CHEST 1 VIEW COMPARISON:  Previous studies including the examination of 02/01/2023 FINDINGS: Transverse diameter of heart is increased. Metallic sutures are seen in sternum suggesting previous cardiac surgery. Increased interstitial markings are seen in left parahilar region and left lower lung field. There is blunting of left lateral CP angle suggesting small effusion. Right lateral CP angle is unremarkable. There is no pneumothorax. IMPRESSION: Cardiomegaly. There are no signs of alveolar pulmonary edema. Increased interstitial markings are seen in  left parahilar region and left lower lung fields suggesting asymmetric interstitial edema or interstitial pneumonia. Small left pleural effusion. Electronically Signed   By: Ernie Avena M.D.   On: 02/04/2023 15:31      Blood pressure 135/67, pulse 77, temperature 98.3 F (36.8 C), temperature source Oral, resp. rate 18, height 5\' 11"  (1.803 m), weight (!) 144 kg, SpO2 97 %.  Medical Problem List and Plan: 1. Functional deficits secondary to debility due to CAD s/p CABG and resulting sternal wound and right pectoralis major muscle advancement flap   -patient may not shower  -ELOS/Goals: 5-7 days, Mod I goals PT/OT  -Admit to CIR   2.  Antithrombotics: -DVT/anticoagulation:  Pharmaceutical: Lovenox  -antiplatelet therapy: Aspirin 325 mg daily  3. Pain Management: Tylenol, oxycodone, tramadol as needed  -gabapentin 200 mg BID  4. Mood/Behavior/Sleep: LCSW to evaluate and provide emotional support; history of bipolar 1 disorder  -antipsychotic agents: n/a  -continue Lamictal 100 mg daily   5. Neuropsych/cognition: This patient is capable of making decisions on his own behalf.  6. Skin/Wound Care: Routine skin care checks  -continue wound VAC and drain>>follow-up with plastic surgery regarding management   7. Fluids/Electrolytes/Nutrition: Routine Is and Os and follow-up chemistries  -continue Juven, zinc  8: Hypertension: monitor TID and prn.  Monitor with mobility  -continue Lopressor 37.5 mg BID  -continue Lasix 40 mg daily  -continue Pacerone 200 mg daily  9: Hyperlipidemia: continue Crestor 10 mg daily  10: DM-2: CBGs QID, carb modified diet, A1c = 6.6% (new diagnosis ??)  -continue SSI  -CBGs appear well-controlled on current regimen  11: Tobacco use/cough: cessation counseling  -continue nicotine patch  -continue Pulmicort nebs BID  -continue Tussionex BID  -continue Mucinex BID  -continue Robitussin prn  12: CAD: s/p CBAG; on crestor 10mg  and  aspirin  13: Post-op atrial fibrillation with RVR; rate has been controlled  -continue Pacerone, Lopressor  -on aspirin 325 mg daily  14: GERD: continue Protonix  15: ABLA: H and H improving, last HGB 9.1 6/11; follow-up CBC  16: Hyponatremia, mild 134 on 6/11 : follow-up BMP  I  have personally performed a face to face diagnostic evaluation of this patient and formulated the key components of the plan.  Additionally, I have personally reviewed laboratory data, imaging studies, as well as relevant notes and concur with the physician assistant's documentation above.  The patient's status has not changed from the original H&P.  Any changes in documentation from the acute care chart have been noted above.  Fanny Dance, MD, FAAPMR   Milinda Antis, PA-C 02/04/2023

## 2023-02-04 NOTE — Progress Notes (Signed)
CARDIAC REHAB PHASE I   Pt refused mobility. States he is to SOB and tired right now. Complains of feeling "wiped out". Educated on importance of mobility, IS use and oob to chair. Pt walked once yesterday, per chart. It is difficult to educate pt, as he wants to talk during visits and will not really listen to education. Will return to offer walk later today as time allows. Will continue to follow.   [id number] Woodroe Chen, RN BSN 02/04/2023 12:11 PM

## 2023-02-04 NOTE — TOC Transition Note (Signed)
Transition of Care (TOC) - CM/SW Discharge Note Donn Pierini RN, BSN Transitions of Care Unit 4E- RN Case Manager See Treatment Team for direct phone #   Patient Details  Name: Bryan Brock MRN: [id number] Date of Birth: 1956/03/17  Transition of Care [facility]) CM/SW Contact:  Darrold Span, RN Phone Number: 02/04/2023, 4:08 PM   Clinical Narrative:    Pt has been cleared for transition to [facility] INPT rehab today, Rehab liaison has confirmed bed and pt will admit today.   CM has notified [facility] liaison that pt is going to INPT rehab- previous home VAC that was approved will need to be resubmitted for approval if pt needs home VAC on discharge from Rehab. French Ana w/ 14M to follow up with pt in INPT rehab for wound vac needs.   HH set up with [facility]- liaison also notified pt going to rehab and they will follow for [facility] needs post rehab stay.    Final next level of care: IP Rehab Facility Barriers to Discharge: Barriers Resolved   Patient Goals and CMS Choice CMS Medicare.gov Compare Post Acute Care list provided to:: Patient Choice offered to / list presented to : Patient  Discharge Placement                 [facility] INPT rehab        Discharge Plan and Services Additional resources added to the After Visit Summary for   In-house Referral: NA Discharge Planning Services: CM Consult Post Acute Care Choice: Durable Medical Equipment, Home Health          DME Arranged: Walker rolling DME Agency: [employer]       HH Arranged: RN, PT, OT [facility] Agency: [facility]     Representative spoke with at [facility] Agency: Bjorn Loser  Social Determinants of Health (SDOH) Interventions SDOH Screenings   Food Insecurity: No Food Insecurity (01/05/2023)  Housing: Low Risk  (01/05/2023)  Transportation Needs: No Transportation Needs (01/05/2023)  Utilities: Not At Risk (01/05/2023)  Alcohol Screen: Low Risk  (03/05/2022)  Depression (PHQ2-9): Low Risk  (10/31/2022)  Financial  Resource Strain: Low Risk  (03/05/2022)  Physical Activity: Inactive (03/05/2022)  Social Connections: Moderately Integrated (03/05/2022)  Stress: No Stress Concern Present (03/05/2022)  Tobacco Use: High Risk (02/01/2023)     Readmission Risk Interventions    02/04/2023    4:08 PM  Readmission Risk Prevention Plan  Transportation Screening Complete  Home Care Screening Complete  Medication Review (RN CM) Complete

## 2023-02-04 NOTE — Progress Notes (Signed)
Patient admitted to room [phone]. Patient a/o x4. Patient with no distress, but states he has been SOB at times (no complaints at this time). Patient VSS. Patient has midline wound vac to chest, two jp drains present. No drainage present, areas are clean, dry and intact. Skin noted to be intact. Patient has a scab to right calf from CABG per patient. LBM today. Patient with no concerns, oriented to Rehab unit. Denies pain at this time. Call bell in reach.

## 2023-02-04 NOTE — Progress Notes (Signed)
Inpatient Rehab Admissions Coordinator:    Pt. To admit to CIR today. RN may call report to [phone].   Pt. In agreement to admit to CIR for an estimated 7-10 days with the goal of reaching mod I goals and discharging home with intermittent support from his wife.  Megan Salon, MS, CCC-SLP Rehab Admissions Coordinator  [phone] (celll) [phone] (office)

## 2023-02-04 NOTE — Progress Notes (Signed)
PMR Admission Coordinator Pre-Admission Assessment   Patient: Bryan Brock is an 67 y.o., male MRN: [id number] DOB: [birth year] Height: 5\' 11"  (180.3 cm) Weight: (!) 145 kg   Insurance Information HMO:     PPO:      PCP:      IPA:      80/20:      OTHER:  PRIMARY:  Medicare A and B       Policy#: [id number] Subscriber: Pt. Phone#: Verified online    Fax#:  Pre-Cert#:       Employer:  Benefits:  Phone #:      Name:  Eff. Date: Parts A and B effective 12/25/2019  Deduct: $1632      Out of Pocket Max:  None      Life Max: N/A  CIR: 100%      SNF: 100 days Outpatient: 80%     Co-Pay: 20% Home Health58: 100%      Co-Pay: none DME: 80%     Co-Pay: 20% Providers: patient's choice SECONDARY: BCBS Supplement       Policy#: [id number]      Phone#:    Financial Counselor:       Phone#:    The "Data Collection Information Summary" for patients in Inpatient Rehabilitation Facilities with attached "Privacy Act Statement-Health Care Records" was provided and verbally reviewed with: Patient   Emergency Contact Information Contact Information       Name Relation Home Work Mobile    McLaughlin,Leslie Significant other   [phone]           Current Medical History  Patient Admitting Diagnosis: CABG, Cardiac Debility  History of Present Illness: Bryan Brock is a 67 year old male who was evaluated earlier this year by Dr. Lavinia Sharps concerning coronary artery disease.  He underwent extensive workup and was found to have multivessel disease consisting of serial right coronary artery lesions, high-grade left circumflex disease, are FFR positive LAD D disease and moderate second diagonal disease.  He was counseled regarding recommendations for outpatient cardiothoracic surgical evaluation.  Echocardiogram showed normal left ventricular systolic function and no other valvular abnormality.  He was counseled regarding recommendations for coronary bypass graft surgery is the best treatment for  resolution of his exertional fatigue and shortness of breath and preserve myocardium.  On 5/09, he underwent coronary artery bypass grafting x 4 with endoscopic vein harvest from right and left legs.  He was transferred to the ICU for observation and extubated on 5/09.  Required blood pressure support and was not diuresed.  He was transferred to stepdown unit on 5/11.  He developed a cough and was not using his pillow to stabilize his chest.  The patient was strongly advised regarding risk of sternal dehiscence is higher with morbid obesity.  CT chest was obtained to rule out sternal dehiscence.  On 5/14,  he was returned to the operating room and underwent sternal rewiring.  He was noted to be in atrial fibrillation with controlled rate in the 80s.  Amiodarone was switched to intravenous administration.  Was extubated on 5/15. Chest tubes discontinued 5/20. Some drainage from sternal incision prompted CT examination on 5/22. Chest CT showed displacement of 3rd, 4th and 5th ribs with similar air and fluid levels around the site. Continued dressing changes and Keflex prophylactically .  Diuresed.  He underwent left pleurocentesis on 5/26.  Repeat chest CT 5/26 failed significant anterior chest wall collection on the left and complete dehiscence of the left ribs from the  sternum.  He was returned to the operating room on 5/27 and underwent I&D of peristernal fluid collection and placement of wound VAC.  Continued on cefepime and vancomycin and follow-up wound cultures.  Plastic surgery consultation was obtained on 5/29 and he was returned to the operating room on 5/30 and underwent excision of sternal wound skin and soft tissue and intermediate closure of the wound with placement of wound VAC by Dr. Ulice Bold.  He underwent this procedure again on 6/05.  On 6/07 he underwent right pectoralis major muscle advancement flap and placement of wound VAC by Dr. Ulice Bold.  He is voiding well and tolerating a carb modified  diet.The patient requires inpatient medicine and rehabilitation evaluations and services for ongoing dysfunction secondary to coronary artery disease status post CABG with sternal dehiscence, pectoralis major muscle flap. Pt. Seen by PT/OT and they recommend CIR to assist return to PLOF.    Patient's medical record from [facility]  has been reviewed by the rehabilitation admission coordinator and physician.   Past Medical History      Past Medical History:  Diagnosis Date   Acne     ADHD (attention deficit hyperactivity disorder)     Arthritis     Asthma     Bipolar 1 disorder (HCC)     Cancer (HCC)      basal cell cancer removed from back   Cataract     Coronary artery disease     Depression     Dyspnea     Dysrhythmia      PVCs   GERD (gastroesophageal reflux disease)     Heart murmur     Hepatitis      remote hx Hepatitis A (caused by food contaminant in childhood)   History of hiatal hernia     Hyperlipidemia     Hypertension     Mitral valve prolapse     Panic attacks     Peripheral vascular disease (HCC)     PFO (patent foramen ovale)      ? small PFO per echo   PONV (postoperative nausea and vomiting)     Pre-diabetes     Sleep apnea        Has the patient had major surgery during 100 days prior to admission? Yes   Family History   family history includes Cancer in his father; Diabetes in his mother; Heart disease in his father and mother; Hyperlipidemia in his father and mother; Obesity in his mother.   Current Medications   Current Facility-Administered Medications:    albuterol (PROVENTIL) (2.5 MG/3ML) 0.083% nebulizer solution 2.5 mg, 2.5 mg, Nebulization, Q4H PRN, [name], MD   ALPRAZolam Prudy Feeler) tablet 0.5 mg, 0.5 mg, Oral, BID PRN, [name], MD, 0.5 mg at 01/27/23 2232   amiodarone (PACERONE) tablet 200 mg, 200 mg, Oral, Daily, Barrett, Erin R, PA-C, 200 mg at 01/28/23 0900   aspirin EC tablet 325 mg, 325 mg, Oral, Daily,  325 mg at 01/28/23 0900 **OR** [DISCONTINUED] aspirin chewable tablet 324 mg, 324 mg, Per Tube, Daily, Bartle, Payton Doughty, MD   bisacodyl (DULCOLAX) EC tablet 10 mg, 10 mg, Oral, Daily, Bartle, Payton Doughty, MD, 10 mg at 01/28/23 0859   budesonide (PULMICORT) nebulizer solution 0.5 mg, 0.5 mg, Nebulization, BID, [name], MD, 0.5 mg at 01/28/23 0759   calcium carbonate (TUMS - dosed in mg elemental calcium) chewable tablet 400 mg of elemental calcium, 400 mg of elemental calcium, Oral, BID  PRN, [name], MD, 400 mg of elemental calcium at 01/12/23 1121   cephALEXin (KEFLEX) capsule 500 mg, 500 mg, Oral, TID AC & HS, Stehler, Bailey C, PA-C, 500 mg at 01/28/23 1146   chlorpheniramine-HYDROcodone (TUSSIONEX) 10-8 MG/5ML suspension 5 mL, 5 mL, Oral, Q12H, Bartle, Payton Doughty, MD, 5 mL at 01/28/23 0901   docusate sodium (COLACE) capsule 200 mg, 200 mg, Oral, Daily, [name], MD, 200 mg at 01/28/23 0859   enoxaparin (LOVENOX) injection 40 mg, 40 mg, Subcutaneous, Daily, [name], MD, 40 mg at 01/28/23 0858   gabapentin (NEURONTIN) capsule 200 mg, 200 mg, Oral, BID, Stehler, Bailey C, PA-C, 200 mg at 01/28/23 0858   guaiFENesin (MUCINEX) 12 hr tablet 1,200 mg, 1,200 mg, Oral, BID, [name], MD, 1,200 mg at 01/28/23 0859   CBG monitoring, , , 4x Daily, AC & HS **AND** insulin aspart (novoLOG) injection 0-24 Units, 0-24 Units, Subcutaneous, TID AC & HS, [name], MD, 2 Units at 01/28/23 1147   lactulose (CHRONULAC) 10 GM/15ML solution 20 g, 20 g, Oral, PRN, Loreli Slot, MD   lamoTRIgine (LAMICTAL) tablet 100 mg, 100 mg, Oral, Daily, [name], MD, 100 mg at 01/28/23 1146   metoCLOPramide (REGLAN) tablet 10 mg, 10 mg, Oral, TID AC, [name], MD, 10 mg at 01/28/23 1146   metoprolol tartrate (LOPRESSOR) tablet 37.5 mg, 37.5 mg, Oral, BID, [name], MD, 37.5 mg at 01/28/23 0858   Muscle Rub CREA, , Topical, PRN, [name], MD, Given at 01/12/23  1020   nicotine (NICODERM CQ - dosed in mg/24 hours) patch 14 mg, 14 mg, Transdermal, Daily, [name], MD, 14 mg at 01/28/23 0907   nutrition supplement (JUVEN) (JUVEN) powder packet 1 packet, 1 packet, Oral, BID BM, [name], MD, 1 packet at 01/28/23 0859   ondansetron (ZOFRAN) injection 4 mg, 4 mg, Intravenous, Q6H PRN, [name], MD   Oral care mouth rinse, 15 mL, Mouth Rinse, PRN, [name], MD   oxyCODONE (Oxy IR/ROXICODONE) immediate release tablet 10 mg, 10 mg, Oral, Q3H PRN, [name], MD, 10 mg at 01/28/23 0914   pantoprazole (PROTONIX) EC tablet 40 mg, 40 mg, Oral, Daily, [name], MD, 40 mg at 01/28/23 0858   potassium chloride SA (KLOR-CON M) CR tablet 20 mEq, 20 mEq, Oral, BID, Stehler, Bailey C, PA-C, 20 mEq at 01/28/23 0858   rosuvastatin (CRESTOR) tablet 10 mg, 10 mg, Oral, Daily, [name], MD, 10 mg at 01/28/23 0902   simethicone (MYLICON) chewable tablet 80 mg, 80 mg, Oral, BID PRN, [name], MD, 80 mg at 01/22/23 1356   traMADol (ULTRAM) tablet 50-100 mg, 50-100 mg, Oral, Q4H PRN, [name], MD, 100 mg at 01/20/23 2128   zinc sulfate capsule 220 mg, 220 mg, Oral, Daily, [name], MD, 220 mg at 01/28/23 0902   Patients Current Diet:  Diet Order                  Diet heart healthy/carb modified Room service appropriate? Yes; Fluid consistency: Thin  Diet effective now                         Precautions / Restrictions Precautions Precautions: Sternal, Fall, Other (comment) Precaution Booklet Issued: No Precaution Comments: Watch SpO2, VAC Restrictions Weight Bearing Restrictions: Yes RUE Weight Bearing: Non weight bearing LUE Weight Bearing:  Non weight bearing Other Position/Activity Restrictions: sternal    Has the patient had 2 or more falls or a fall with injury in the past year? No   Prior Activity Level  Pt. Active in the community PTA    Prior Functional Level Self Care: Did the patient  need help bathing, dressing, using the toilet or eating? Independent   Indoor Mobility: Did the patient need assistance with walking from room to room (with or without device)? Independent   Stairs: Did the patient need assistance with internal or external stairs (with or without device)? Independent   Functional Cognition: Did the patient need help planning regular tasks such as shopping or remembering to take medications? Independent   Patient Information Are you of Hispanic, Latino/a,or Spanish origin?: A. No, not of Hispanic, Latino/a, or Spanish origin What is your race?: A. White Do you need or want an interpreter to communicate with a doctor or health care staff?: 0. No   Patient's Response To:  Health Literacy and Transportation Is the patient able to respond to health literacy and transportation needs?: Yes Health Literacy - How often do you need to have someone help you when you read instructions, pamphlets, or other written material from your doctor or pharmacy?: Never In the past 12 months, has lack of transportation kept you from medical appointments or from getting medications?: No In the past 12 months, has lack of transportation kept you from meetings, work, or from getting things needed for daily living?: No   Home Assistive Devices / Equipment Home Assistive Devices/Equipment: Blood pressure cuff, Eyeglasses Home Equipment: Shower seat   Prior Device Use: Indicate devices/aids used by the patient prior to current illness, exacerbation or injury? None of the above   Current Functional Level Cognition   Overall Cognitive Status: Within Functional Limits for tasks assessed Orientation Level: Oriented X4 General Comments: aware of precautions and situation    Extremity Assessment (includes Sensation/Coordination)   Upper Extremity Assessment: Overall WFL for tasks assessed  Lower Extremity Assessment: Defer to PT evaluation     ADLs   Overall ADL's : Needs  assistance/impaired Grooming: Oral care, Standing Grooming Details (indicate cue type and reason): cues to maintain sternal prec Upper Body Dressing : Moderate assistance, Sitting Lower Body Dressing: Maximal assistance, Sit to/from stand Lower Body Dressing Details (indicate cue type and reason): AE training with reacher, sock aide, LH shoehorn, LH sponge, dressing stick, and sock aide Toilet Transfer: Minimal assistance, Ambulation, Rolling walker (2 wheels), Regular Toilet Toilet Transfer Details (indicate cue type and reason): cues for technique Toileting- Clothing Manipulation and Hygiene: Total assistance, Sitting/lateral lean Toileting - Clothing Manipulation Details (indicate cue type and reason): posterior pericare in standing Functional mobility during ADLs: Minimal assistance, Rolling walker (2 wheels) General ADL Comments: focused on AE training for LB dressing and bathing     Mobility   Overal bed mobility: Needs Assistance Bed Mobility: Rolling, Sit to Sidelying Rolling: Supervision Sidelying to sit: Min assist Sit to supine: Min assist Sit to sidelying: Min assist General bed mobility comments: sitting EoB with cardiac rehab on entry     Transfers   Overall transfer level: Needs assistance Equipment used: Rolling walker (2 wheels) Transfers: Sit to/from Stand Sit to Stand: Min guard Bed to/from chair/wheelchair/BSC transfer type:: Step pivot Step pivot transfers: Min assist General transfer comment: min guard for safety, vc for use of sternal pillow during power up to refrain pushing off with UE, light min A for steadying with stepping  to recliner     Ambulation / Gait / Stairs / Wheelchair Mobility   Ambulation/Gait Ambulation/Gait assistance: Land (Feet): 70 Feet Assistive device: Rolling walker (2 wheels) Gait Pattern/deviations: Step-through pattern, Decreased stride length General Gait Details: cues for upright posture, Gait velocity:  WFL Gait velocity interpretation: 1.31 - 2.62 ft/sec, indicative of limited community ambulator Stairs: Yes Stairs assistance: Min assist Stair Management: Backwards, With walker Number of Stairs: 1 (x3) General stair comments: backwards stepping up step, pt with decreased ability to refrain from increased pushing with UE,vc for decreased UE use, limited by R knee pain, sequenced to maximize L LE strength, pt ultimately unable to progress to next step up with RW without increased force through UE, discussed that having rails put on his steps would be the safest option. Pt in agreement     Posture / Balance Dynamic Sitting Balance Sitting balance - Comments: EOB without support Balance Overall balance assessment: Needs assistance Sitting-balance support: No upper extremity supported, Feet supported Sitting balance-Leahy Scale: Good Sitting balance - Comments: EOB without support Standing balance support: Bilateral upper extremity supported, During functional activity Standing balance-Leahy Scale: Fair Standing balance comment: can stand without UB support, RW for gait     Special needs/care consideration Wound Vac sternal  and Skin surgical incision     Previous Home Environment (from acute therapy documentation) Living Arrangements: Spouse/significant other Available Help at Discharge: Family, Available 24 hours/day Type of Home: House Home Layout: Two level, Able to live on main level with bedroom/bathroom Home Access: Stairs to enter Entrance Stairs-Number of Steps: 5 Bathroom Shower/Tub: Engineer, manufacturing systems: Handicapped height Bathroom Accessibility: Yes How Accessible: Accessible via walker Home Care Services: No Additional Comments: significant other was ordering RW and shower seat   Discharge Living Setting Plans for Discharge Living Setting: Patient's home Type of Home at Discharge: House Discharge Home Layout: Two level, Able to live on main level with  bedroom/bathroom Alternate Level Stairs-Rails: Right, Left Alternate Level Stairs-Number of Steps: flight Discharge Home Access: Stairs to enter Entrance Stairs-Rails: Right, Left Entrance Stairs-Number of Steps: 5 Discharge Bathroom Shower/Tub: Tub/shower unit Discharge Bathroom Toilet: Handicapped height Discharge Bathroom Accessibility: Yes How Accessible: Accessible via wheelchair, Accessible via walker Does the patient have any problems obtaining your medications?: No   Social/Family/Support Systems Patient Roles: Spouse Contact Information: [phone] Anticipated Caregiver: Lucienne Minks Ability/Limitations of Caregiver: Works days Medical laboratory scientific officer: Evenings only Discharge Plan Discussed with Primary Caregiver: Yes Is Caregiver In Agreement with Plan?: Yes Does Caregiver/Family have Issues with Lodging/Transportation while Pt is in Rehab?: No   Goals Patient/Family Goal for Rehab: PT/OT Mod I Expected length of stay: 5-7 days Pt/Family Agrees to Admission and willing to participate: Yes Program Orientation Provided & Reviewed with Pt/Caregiver Including Roles  & Responsibilities: Yes   Decrease burden of Care through IP rehab admission: Not anticipated    Possible need for SNF placement upon discharge: no    Patient Condition: I have reviewed medical records from [facility] , spoken with CM, and patient. I met with patient at the bedside for inpatient rehabilitation assessment.  Patient will benefit from ongoing PT and OT, can actively participate in 3 hours of therapy a day 5 days of the week, and can make measurable gains during the admission.  Patient will also benefit from the coordinated team approach during an Inpatient Acute Rehabilitation admission.  The patient will receive intensive therapy as well as Rehabilitation physician, nursing, social worker, and  care management interventions.  Due to safety, skin/wound care, disease management,  medication administration, pain management, and patient education the patient requires 24 hour a day rehabilitation nursing.  The patient is currently min-min guard with mobility and basic ADLs.  Discharge setting and therapy post discharge at home with home health is anticipated.  Patient has agreed to participate in the Acute Inpatient Rehabilitation Program and will admit today.   Preadmission Screen Completed By:  Jeronimo Greaves, 01/28/2023 1:18 PM ______________________________________________________________________   Discussed status with Dr. Natale Lay  on 02/04/23 at 1000 and received approval for admission today.   Admission Coordinator:  Jeronimo Greaves, CCC-SLP, time 1400/Date 02/04/23    Assessment/Plan: Diagnosis: Debility related to CAD  s/p CABG Does the need for close, 24 hr/day Medical supervision in concert with the patient's rehab needs make it unreasonable for this patient to be served in a less intensive setting? Yes Co-Morbidities requiring supervision/potential complications: HTN, DM2, Tobacco use, CAD, Afib, Gerd, ABLA, hyponatremia  Due to bladder management, bowel management, safety, skin/wound care, disease management, medication administration, pain management, and patient education, does the patient require 24 hr/day rehab nursing? Yes Does the patient require coordinated care of a physician, rehab nurse, PT, OT, and SLP to address physical and functional deficits in the context of the above medical diagnosis(es)? Yes Addressing deficits in the following areas: balance, endurance, locomotion, strength, transferring, bowel/bladder control, bathing, dressing, feeding, grooming, toileting, and psychosocial support Can the patient actively participate in an intensive therapy program of at least 3 hrs of therapy 5 days a week? Yes The potential for patient to make measurable gains while on inpatient rehab is excellent and good Anticipated functional outcomes upon discharge from  inpatient rehab: modified independent PT, modified independent OT, modified independent SLP Estimated rehab length of stay to reach the above functional goals is: 5-7 days Anticipated discharge destination: Home 10. Overall Rehab/Functional Prognosis: excellent     MD Signature: Fanny Dance

## 2023-02-04 NOTE — Progress Notes (Addendum)
      [address]       [place] [address]             [phone]      4 Days Post-Op Procedure(s) (LRB): RIGHT PECTORALIS FLAP (Right) IRRIGATION AND DEBRIDEMENT OF STERNAL WOUND (N/A) Subjective: Pt complains of shortness of breath that comes and goes.  Objective: Vital signs in last 24 hours: Temp:  [97.6 F (36.4 C)-98.3 F (36.8 C)] 97.6 F (36.4 C) (06/11 0720) Pulse Rate:  [63-74] 63 (06/11 0439) Cardiac Rhythm: Sinus bradycardia (06/10 2315) Resp:  [16-26] 19 (06/11 0720) BP: (112-129)/(58-75) 112/74 (06/11 0720) SpO2:  [95 %-100 %] 100 % (06/11 0720)  Hemodynamic parameters for last 24 hours:    Intake/Output from previous day: 06/10 0701 - 06/11 0700 In: -  Out: 3390 [Urine:3050; Drains:340] Intake/Output this shift: No intake/output data recorded.  General appearance: alert, cooperative, and no distress Neurologic: intact Heart: regular rate and rhythm, S1, S2 normal, no murmur, click, rub or gallop Lungs: clear to auscultation bilaterally Abdomen: soft, non-tender; bowel sounds normal; no masses,  no organomegaly Extremities: edema 1+ Wound: Drains with serosanguinous output, EVH sites are healing well without sign of infection  Lab Results: Recent Labs    02/03/23 0202 02/04/23 0131  WBC 4.4 4.6  HGB 8.5* 9.1*  HCT 28.5* 29.9*  PLT 313 341   BMET:  Recent Labs    02/04/23 0131  NA 134*  K 3.8  CL 102  CO2 26  GLUCOSE 115*  BUN 20  CREATININE 1.15  CALCIUM 8.0*    PT/INR: No results for input(s): "LABPROT", "INR" in the last 72 hours. ABG    Component Value Date/Time   PHART 7.399 01/08/2023 0830   HCO3 28.2 (H) 01/08/2023 0830   TCO2 30 01/08/2023 0830   ACIDBASEDEF 1.0 01/07/2023 1936   O2SAT 97 01/08/2023 0830   CBG (last 3)  Recent Labs    02/03/23 1720 02/03/23 2114 02/04/23 0607  GLUCAP 100* 99 116*    Assessment/Plan: S/P Procedure(s) (LRB): RIGHT PECTORALIS FLAP (Right) IRRIGATION AND  DEBRIDEMENT OF STERNAL WOUND (N/A)  CV: NSR, HR 70s. SBP 112. Hx of afib. Continue Amiodarone 200mg  daily and Lopressor 37.5mg  BID.   Pulm: Saturating 100% on RA. Continue nebulizers. Encourage IS, flutter valve and ambulation.    GI: Good appetite, no nausea. +BM this AM   Endo: CBGs controlled on SSI. New diabetes diagnosis A1C 6.6, outpatient appt with PCP has been arranged.   Renal: Stable Cr 1.15. Great UO 1750cc/24hrs.    Expected postop ABLA: Improved 9.1/29.9   Sternal dehiscence: s/p pec flap, drain output 340cc/24 hrs, serosanguinous output. Plastic surgery following.   Deconditioning: CIR following and approved him for admission yesterday, pending medical readiness. Continue work with PT/OT.   Dispo: Pt stable for d/c from our standpoint. Will d/c to CIR once pt is ready for d/c from plastic surgery standpoint, appreciate their assistance.   LOS: 33 days    Jenny Reichmann, PA-C 02/04/2023   Chart reviewed, patient examined, agree with above. Reports some shortness of breath this am which he has had intermittently. Will repeat CXR since he has not had one in 3 days. He had some left base atelectasis on his prior CXR. Will also resume lasix since his wt is above preop and he has some LE edema. He needs to stay on diuretic. Planning CIR.

## 2023-02-04 NOTE — Progress Notes (Signed)
Inpatient Rehab Admissions Coordinator:    I potentially have a CIR bed for this Pt today but await CXR results and medical clearance from MD.   Megan Salon, MS, CCC-SLP Rehab Admissions Coordinator  [phone] (celll) [phone] (office)

## 2023-02-05 ENCOUNTER — Ambulatory Visit: Payer: Medicare Other | Admitting: Surgery

## 2023-02-05 DIAGNOSIS — R5381 Other malaise: Secondary | ICD-10-CM | POA: Diagnosis not present

## 2023-02-05 DIAGNOSIS — I25708 Atherosclerosis of coronary artery bypass graft(s), unspecified, with other forms of angina pectoris: Secondary | ICD-10-CM

## 2023-02-05 LAB — CBC WITH DIFFERENTIAL/PLATELET
Abs Immature Granulocytes: 0.06 10*3/uL (ref 0.00–0.07)
Basophils Absolute: 0 10*3/uL (ref 0.0–0.1)
Basophils Relative: 1 %
Eosinophils Absolute: 0.5 10*3/uL (ref 0.0–0.5)
Eosinophils Relative: 9 %
HCT: 32.6 % — ABNORMAL LOW (ref 39.0–52.0)
Hemoglobin: 9.7 g/dL — ABNORMAL LOW (ref 13.0–17.0)
Immature Granulocytes: 1 %
Lymphocytes Relative: 23 %
Lymphs Abs: 1.2 10*3/uL (ref 0.7–4.0)
MCH: 28.1 pg (ref 26.0–34.0)
MCHC: 29.8 g/dL — ABNORMAL LOW (ref 30.0–36.0)
MCV: 94.5 fL (ref 80.0–100.0)
Monocytes Absolute: 0.5 10*3/uL (ref 0.1–1.0)
Monocytes Relative: 9 %
Neutro Abs: 3 10*3/uL (ref 1.7–7.7)
Neutrophils Relative %: 57 %
Platelets: 381 10*3/uL (ref 150–400)
RBC: 3.45 MIL/uL — ABNORMAL LOW (ref 4.22–5.81)
RDW: 14.7 % (ref 11.5–15.5)
WBC: 5.2 10*3/uL (ref 4.0–10.5)
nRBC: 0.4 % — ABNORMAL HIGH (ref 0.0–0.2)

## 2023-02-05 LAB — GLUCOSE, CAPILLARY
Glucose-Capillary: 106 mg/dL — ABNORMAL HIGH (ref 70–99)
Glucose-Capillary: 119 mg/dL — ABNORMAL HIGH (ref 70–99)
Glucose-Capillary: 121 mg/dL — ABNORMAL HIGH (ref 70–99)
Glucose-Capillary: 124 mg/dL — ABNORMAL HIGH (ref 70–99)
Glucose-Capillary: 97 mg/dL (ref 70–99)

## 2023-02-05 LAB — COMPREHENSIVE METABOLIC PANEL
ALT: 33 U/L (ref 0–44)
AST: 25 U/L (ref 15–41)
Albumin: 2.5 g/dL — ABNORMAL LOW (ref 3.5–5.0)
Alkaline Phosphatase: 97 U/L (ref 38–126)
Anion gap: 9 (ref 5–15)
BUN: 16 mg/dL (ref 8–23)
CO2: 25 mmol/L (ref 22–32)
Calcium: 8.4 mg/dL — ABNORMAL LOW (ref 8.9–10.3)
Chloride: 102 mmol/L (ref 98–111)
Creatinine, Ser: 1.2 mg/dL (ref 0.61–1.24)
GFR, Estimated: >60 mL/min (ref >60)
Glucose, Bld: 114 mg/dL — ABNORMAL HIGH (ref 70–99)
Potassium: 4 mmol/L (ref 3.5–5.1)
Sodium: 136 mmol/L (ref 135–145)
Total Bilirubin: 0.3 mg/dL (ref 0.3–1.2)
Total Protein: 5.4 g/dL — ABNORMAL LOW (ref 6.5–8.1)

## 2023-02-05 NOTE — Patient Care Conference (Signed)
Inpatient RehabilitationTeam Conference and Plan of Care Update Date: 02/05/2023   Time: 10:07 AM    Patient Name: Bryan Brock      Medical Record Number: [id number]  Date of Birth: November 13, 1955 Sex: Male         Room/Bed: 4W25C/4W25C-01 Payor Info: Payor: MEDICARE / Plan: MEDICARE PART A AND B / Product Type: *No Product type* /    Admit Date/Time:  02/04/2023  4:14 PM  Primary Diagnosis:  <principal problem not specified>  Hospital Problems: Active Problems:   Debility    Expected Discharge Date: Expected Discharge Date: 02/11/23 (evals pending)  Team Members Present: Physician leading conference: Dr. Claudette Laws Social Worker Present: Lavera Guise, BSW Nurse Present: Chana Bode, RN PT Present: Casimiro Needle, PT OT Present: Primitivo Gauze, OT PPS Coordinator present : Fae Pippin, SLP     Current Status/Progress Goal Weekly Team Focus  Bowel/Bladder      Continent of bowel and bladder          Swallow/Nutrition/ Hydration               ADL's   eval pending            Mobility   eval pending           Communication                Safety/Cognition/ Behavioral Observations               Pain      Prns for pain control    Pain < 4 with prns    Assess pain and effectiveness of medications  Skin      Wound vac to sternal wound post dehiscence CABG incisision; JP x2   Patient able to manage skin care at discharge    Wound vac education Skin care education and hands on practice    Discharge Planning:    2 level, main B+B , 5 ste, no rails, S.O. works day shift  Team Discussion: Patient with surgical site dehiscence, wound vac and JP drain post CABG. Note fluid overload on Lasix/TTEDs.  Patient on target to meet rehab goals: Evals pending  *See Care Plan and progress notes for long and short-term goals.   Revisions to Treatment Plan:  N/a  Teaching Needs: Safety, medications, dietary modifications, skin care, wound  care, transfers, etc.  Current Barriers to Discharge: Decreased caregiver support, Home enviroment access/layout, and Wound care  Possible Resolutions to Barriers: Family education     Medical Summary Current Status: Sternal wound dehiscence requiring reconstructive surgery by plastic surgery.  Fluid overload  Barriers to Discharge: Volume Overload;Complicated Wound;Cardiac Complications;Morbid Obesity   Possible Resolutions to [employer] Focus: Fluid and electrolyte management, localized wound care, will need assistance of plastic surgery.   Continued Need for Acute Rehabilitation Level of Care: The patient requires daily medical management by a physician with specialized training in physical medicine and rehabilitation for the following reasons: Direction of a multidisciplinary physical rehabilitation program to maximize functional independence : Yes Medical management of patient stability for increased activity during participation in an intensive rehabilitation regime.: Yes Analysis of laboratory values and/or radiology reports with any subsequent need for medication adjustment and/or medical intervention. : Yes   I attest that I was present, lead the team conference, and concur with the assessment and plan of the team.   Chana Bode B 02/05/2023, 1:15 PM

## 2023-02-05 NOTE — Progress Notes (Addendum)
Physical Therapy Session Note  Patient Details  Name: Bryan Brock MRN: [id number] Date of Birth: [birth year]  Today's Date: 02/05/2023 PT Individual Time: 4098-1191 PT Individual Time Calculation (min): 74 min   Short Term Goals: Week 1:  PT Short Term Goal 1 (Week 1): STG = LTG due to ELOS  Skilled Therapeutic Interventions/Progress Updates: Patient in recliner on entrance to room. Patient alert and agreeable to PT session.   Patient reported 6/10 pain at beginning of session. Attending nurse arrived to provide medication and to drain B chest tubes.  Therapeutic Activity: Bed Mobility: Pt performed supine<>sit on EOB with close supervision/light CGA for safety. VC required to avoid using L HOB rail to pull as to upkeep sternal precautions. Patient close supervision for rolling to R and L side from supine for safety. 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 Patient leans on L elbow to assist in bringing LE's over onto bed (avoids pulling on HOB railing with minimal cuing).  Transfers: Pt performed sit<>stand pivot transfers throughout session with CGA/supervision for safety. Provided VC to avoid using UE's to pull self up, or to push self from chair and to instead hug pillow until standing, and then grab RW with UE. Patient recalled anterior weight shift to assist with momentum. Patient requires increased cues to keep RW in safe proximity when sitting, or standing to as to avoid overstretching UE's for sternal precautions.   - Attending nurse requested patient to be weight. Patient transported to weight machine, and ambulated a couple of steps onto it with no AD, and CGA for safety. (309.4lbs). - Patient performed car transfer from RW with minA for safety in navigating sternal precautions. Patient recalled not to pull from the top, as well as not to pull from steering wheel per report of doing so prior to admission. PTA managed wound vac  during transfer. Patient backed in and leaned onto L elbow to bring LE's into car, and did the same in reverse to get B LE's out of car. - TUG performed today with patient performing in 28 seconds while in RW. PTA managed wound vac during. Patient noted to have unsafe turning stability, as well as unsafe proximity of RW to self.  - Once back in room (patient dependently transported for time management), patient required use of urinal. Patient statically stood with CGA for safety to relieve bladder with no issue.  Patient supine in bed at end of session with brakes locked, bed alarm set, and all needs within reach.  Therapy Documentation Precautions:  Precautions Precautions: Sternal, Fall Precaution Comments: Watch SpO2, sternal wound vac Restrictions Weight Bearing Restrictions: Yes Other Position/Activity Restrictions: sternal precs; pt has chest binder in place around sternal incision.  Therapy/Group: Individual Therapy  Vang Kraeger PTA 02/05/2023, 4:23 PM

## 2023-02-05 NOTE — Progress Notes (Signed)
Physical Therapy Assessment and Plan  Patient Details  Name: Bryan Brock MRN: [id number] Date of Birth: [birth year]  PT Diagnosis: Abnormality of gait, Difficulty walking, and Muscle weakness Rehab Potential: Good ELOS: 7-9 days   Today's Date: 02/05/2023 PT Individual Time: 4098-1191 PT Individual Time Calculation (min): 75 min    Hospital Problem: Active Problems:   Debility   Past Medical History:  Past Medical History:  Diagnosis Date   Acne    ADHD (attention deficit hyperactivity disorder)    Arthritis    Asthma    Bipolar 1 disorder (HCC)    Cancer (HCC)    basal cell cancer removed from back   Cataract    Coronary artery disease    Depression    Dyspnea    Dysrhythmia    PVCs   GERD (gastroesophageal reflux disease)    Heart murmur    Hepatitis    remote hx Hepatitis A (caused by food contaminant in childhood)   History of hiatal hernia    Hyperlipidemia    Hypertension    Mitral valve prolapse    Panic attacks    Peripheral vascular disease (HCC)    PFO (patent foramen ovale)    ? small PFO per echo   PONV (postoperative nausea and vomiting)    Pre-diabetes    Sleep apnea    Past Surgical History:  Past Surgical History:  Procedure Laterality Date   APPLICATION OF WOUND VAC N/A 01/20/2023   Procedure: APPLICATION OF WOUND VAC;  Surgeon: Lovett Sox, MD;  Location: [facility];  Service: Thoracic;  Laterality: N/A;   APPLICATION OF WOUND VAC N/A 01/23/2023   Procedure: APPLICATION OF WOUND VAC;  Surgeon: Peggye Form, DO;  Location: [facility];  Service: Plastics;  Laterality: N/A;   APPLICATION OF WOUND VAC N/A 01/29/2023   Procedure: APPLICATION OF WOUND VAC;  Surgeon: Peggye Form, DO;  Location: [facility];  Service: Plastics;  Laterality: N/A;   CORONARY ARTERY BYPASS GRAFT N/A 01/02/2023   Procedure: CORONARY ARTERY BYPASS GRAFTING (CABG) TIMES FOUR USING THE LEFT INTERNAL MAMMARY ARTERY (LIMA) AND BILATERAL GREATER SAPHENOUS VEIN ARTERIES;   Surgeon: [name], MD;  Location: [facility];  Service: Open Heart Surgery;  Laterality: N/A;  Open median sternotomy   CORONARY PRESSURE/FFR STUDY N/A 11/21/2022   Procedure: INTRAVASCULAR PRESSURE WIRE/FFR STUDY;  Surgeon: Orbie Pyo, MD;  Location: [facility];  Service: Cardiovascular;  Laterality: N/A;   HYDROCELE EXCISION Bilateral 03/22/2022   Procedure: HYDROCELECTOMY ADULT;  Surgeon: Noel Christmas, MD;  Location: [facility];  Service: Urology;  Laterality: Bilateral;  2 HRS   INCISION AND DRAINAGE OF WOUND N/A 01/29/2023   Procedure: Sternal wound irrigation and debridement, placement of myriad and wound VAC change;  Surgeon: Peggye Form, DO;  Location: [facility];  Service: Plastics;  Laterality: N/A;   INCISION AND DRAINAGE OF WOUND N/A 01/31/2023   Procedure: IRRIGATION AND DEBRIDEMENT OF STERNAL WOUND;  Surgeon: Peggye Form, DO;  Location: [facility];  Service: Plastics;  Laterality: N/A;   LEFT HEART CATH AND CORONARY ANGIOGRAPHY N/A 11/21/2022   Procedure: LEFT HEART CATH AND CORONARY ANGIOGRAPHY;  Surgeon: Orbie Pyo, MD;  [facility]: [facility];  Service: Cardiovascular;  Laterality: N/A;   MOHS SURGERY  1990   PECTORALIS FLAP Right 01/31/2023   Procedure: RIGHT PECTORALIS FLAP;  Surgeon: Peggye Form, DO;  Location: [facility];  Service: Plastics;  Laterality: Right;   STERNAL  INCISION RECLOSURE N/A 01/07/2023   Procedure: STERNAL REWIRING;  Surgeon: [name], MD;  Location: [facility];  Service: Thoracic;  Laterality: N/A;   STERNAL WOUND DEBRIDEMENT N/A 01/20/2023   Procedure: DRAIN STERNAL INFECTION;  Surgeon: Lovett Sox, MD;  Location: [facility] OR;  Service: Thoracic;  Laterality: N/A;   STERNAL WOUND DEBRIDEMENT N/A 01/23/2023   Procedure: Excision of sternal wound with Myriad;  Surgeon: Peggye Form, DO;  Location: [facility];  Service: Plastics;  Laterality: N/A;   STRABISMUS SURGERY Left    TEE WITHOUT CARDIOVERSION N/A 01/02/2023    Procedure: TRANSESOPHAGEAL ECHOCARDIOGRAM;  Surgeon: [name], MD;  Location: [facility] OR;  Service: Open Heart Surgery;  Laterality: N/A;   TONSILLECTOMY AND ADENOIDECTOMY     WISDOM TOOTH EXTRACTION      Assessment & Plan Clinical Impression: Patient is a 67 year old male who was evaluated earlier this year by Dr. Lavinia Sharps concerning coronary artery disease.  He underwent extensive workup and was found to have multivessel disease consisting of serial right coronary artery lesions, high-grade left circumflex disease, are FFR positive LAD D disease and moderate second diagonal disease.  He was counseled regarding recommendations for outpatient cardiothoracic surgical evaluation.  Echocardiogram showed normal left ventricular systolic function and no other valvular abnormality.  He was counseled regarding recommendations for coronary bypass graft surgery is the best treatment for resolution of his exertional fatigue and shortness of breath and preserve myocardium.  On 5/09, he underwent coronary artery bypass grafting x 4 with endoscopic vein harvest from right and left legs.  He was transferred to the ICU for observation and extubated on 5/09.  Required blood pressure support and was not diuresed.  He was transferred to stepdown unit on 5/11.  He developed a cough and was not using his pillow to stabilize his chest.  The patient was strongly advised regarding risk of sternal dehiscence is higher with morbid obesity.  CT chest was obtained to rule out sternal dehiscence.  On 5/14,  he was returned to the operating room and underwent sternal rewiring.  He was noted to be in atrial fibrillation with controlled rate in the 80s.  Amiodarone was switched to intravenous administration.  Was extubated on 5/15. Chest tubes discontinued 5/20. Some drainage from sternal incision prompted CT examination on 5/22. Chest CT showed displacement of 3rd, 4th and 5th ribs with similar air and fluid levels around the site.  Continued dressing changes and Keflex prophylactically .  Diuresed.  He underwent left pleurocentesis on 5/26.  Repeat chest CT 5/26 failed significant anterior chest wall collection on the left and complete dehiscence of the left ribs from the sternum.  He was returned to the operating room on 5/27 and underwent I&D of peristernal fluid collection and placement of wound VAC.  Continued on cefepime and vancomycin and follow-up wound cultures.  Plastic surgery consultation was obtained on 5/29 and he was returned to the operating room on 5/30 and underwent excision of sternal wound skin and soft tissue and intermediate closure of the wound with placement of wound VAC by Dr. Ulice Bold.  He underwent this procedure again on 6/05.  On 6/07 he underwent right pectoralis major muscle advancement flap and placement of wound VAC by Dr. Ulice Bold.  He is voiding well and tolerating a carb modified diet.he reports his pain is controlled with current regimen of gabapentin, as needed oxycodone, as needed tramadol.  Reports shortness of breath with activities.  The patient requires inpatient medicine and rehabilitation evaluations  and services for ongoing dysfunction secondary to coronary artery disease status post CABG with sternal dehiscence, pectoralis major muscle flap.   Works part-time in [employer]. Lives with wife who works full-time. He is a non-smoker but does use smokeless tobacco products.  Patient currently requires min with mobility secondary to muscle weakness, decreased cardiorespiratoy endurance,  , and decreased standing balance, decreased postural control, decreased balance strategies, and difficulty maintaining precautions.  Prior to hospitalization, patient was independent  with mobility and lived with Significant other in a House home.  Home access is 5Stairs to enter.  Patient will benefit from skilled PT intervention to maximize safe functional mobility, minimize fall risk, and decrease  caregiver burden for planned discharge home with intermittent assist.  Anticipate patient will benefit from follow up [facility] at discharge.  PT - End of Session Activity Tolerance: Tolerates 30+ min activity without fatigue Endurance Deficit: Yes Endurance Deficit Description: decreased tolerance to upright mobility, hyperverbose which facilitates SOB with activity PT Assessment Rehab Potential (ACUTE/IP ONLY): Good PT Barriers to Discharge: Decreased caregiver support;Home environment access/layout PT Barriers to Discharge Comments: 5 STE, wife works during the day and will have intermittent assist morning/night PT Patient demonstrates impairments in the following area(s): Balance;Pain;Perception;Safety;Endurance PT Transfers Functional Problem(s): Bed Mobility;Bed to Chair;Car PT Locomotion Functional Problem(s): Ambulation;Stairs PT Plan PT Intensity: Minimum of 1-2 x/day ,45 to 90 minutes PT Frequency: 5 out of 7 days PT Duration Estimated Length of Stay: 7-9 days PT Treatment/Interventions: Ambulation/gait training;Discharge planning;DME/adaptive equipment instruction;Functional mobility training;Pain management;Psychosocial support;Therapeutic Activities;UE/LE Strength taining/ROM;UE/LE Coordination activities;Stair training;Therapeutic Exercise;Patient/family education;Neuromuscular re-education;Disease management/prevention;Balance/vestibular training;Community reintegration PT Transfers Anticipated Outcome(s): modI with RW PT Locomotion Anticipated Outcome(s): modI with RW short distances PT Recommendation Follow Up Recommendations: Home health PT Patient destination: Home Equipment Recommended: Rolling walker with 5" wheels Equipment Details: pt has been given bari RW but would also like standard RW   PT Evaluation Precautions/Restrictions Precautions Precautions: Sternal;Fall Precaution Comments: Watch SpO2, sternal wound vac Restrictions Other Position/Activity Restrictions:  sternal precs; pt has chest binder in place around sternal incision. Pain Interference Pain Interference Pain Effect on Sleep: 2. Occasionally Pain Interference with Therapy Activities: 2. Occasionally Pain Interference with Day-to-Day Activities: 2. Occasionally Home Living/Prior Functioning Home Living Available Help at Discharge: Family;Available PRN/intermittently Type of Home: House Home Access: Stairs to enter [employer] of Steps: 5 Entrance Stairs-Rails: Left;Right Home Layout: Two level;Able to live on main level with bedroom/bathroom Bathroom Shower/Tub: Tub/shower unit;Curtain Bathroom Toilet: Handicapped height Bathroom Accessibility: Yes Additional Comments: significant other was ordering RW and shower seat  Lives With: Significant other Prior Function Level of Independence: Independent with basic ADLs;Independent with gait  Able to Take Stairs?: Yes (step to gait pattern due to R knee arthritis) Driving: Yes Vocation: Retired Gaffer: works part time at [employer] Vision/Perception  Vision - History Ability to See in Adequate Light: 0 Adequate Perception Perception: Within Functional Limits Praxis Praxis: Intact  Cognition Overall Cognitive Status: Within Functional Limits for tasks assessed Arousal/Alertness: Awake/alert Orientation Level: Oriented X4 Year: 2024 Month: June Day of Week: Correct Attention: Selective Memory: Appears intact Awareness: Appears intact Problem Solving: Appears intact Safety/Judgment: Appears intact Sensation Sensation Light Touch: Appears Intact Hot/Cold: Appears Intact Proprioception: Appears Intact Stereognosis: Not tested Coordination Gross Motor Movements are Fluid and Coordinated: No Coordination and Movement Description: generalized weakness Motor  Motor Motor: Within Functional Limits   Trunk/Postural Assessment  Cervical Assessment Cervical Assessment: Within Functional  Limits Thoracic Assessment Thoracic Assessment: Within Functional Limits Lumbar  Assessment Lumbar Assessment: Within Functional Limits Postural Control Postural Control: Deficits on evaluation Trunk Control: minimal posterior lean in standing, tends to be more pronounced with initial sit to stand, tends to put weight on his heels and keeps knees extended Righting Reactions: decreased Protective Responses: decreased Postural Limitations: decreased, tendency for posterior LOB posterior lean with immediate standing noted  Balance Static Standing Balance Static Standing - Level of Assistance: 5: Stand by assistance (with UE support on RW) Dynamic Standing Balance Dynamic Standing - Level of Assistance: 4: Min assist Extremity Assessment  RLE Assessment RLE Assessment: Within Functional Limits General Strength Comments: 4/5 LLE Assessment LLE Assessment: Within Functional Limits General Strength Comments: 5/5  Care Tool Care Tool Bed Mobility Roll left and right activity   Roll left and right assist level: Supervision/Verbal cueing    Sit to lying activity   Sit to lying assist level: Minimal Assistance - Patient > 75% (to elevate LE's)    Lying to sitting on side of bed activity   Lying to sitting on side of bed assist level: the ability to move from lying on the back to sitting on the side of the bed with no back support.: Contact Guard/Touching assist     Care Tool Transfers Sit to stand transfer   Sit to stand assist level: Minimal Assistance - Patient > 75%    Chair/bed transfer   Chair/bed transfer assist level: Minimal Assistance - Patient > 75%     Toilet transfer   Assist Level: Minimal Assistance - Patient > 75%    Car transfer   Car transfer assist level: Minimal Assistance - Patient > 75%      Care Tool Locomotion Ambulation   Assist level: Contact Guard/Touching assist Assistive device: Walker-rolling Max distance: 95'  Walk 10 feet activity   Assist  level: Contact Guard/Touching assist Assistive device: Walker-rolling   Walk 50 feet with 2 turns activity   Assist level: Contact Guard/Touching assist Assistive device: Walker-rolling  Walk 150 feet activity Walk 150 feet activity did not occur: Safety/medical concerns      Walk 10 feet on uneven surfaces activity Walk 10 feet on uneven surfaces activity did not occur: Safety/medical concerns      Stairs   Assist level: Minimal Assistance - Patient > 75% Stairs assistive device: 2 hand rails Max number of stairs: 4  Walk up/down 1 step activity   Walk up/down 1 step (curb) assist level: Minimal Assistance - Patient > 75% Walk up/down 1 step or curb assistive device: 2 hand rails  Walk up/down 4 steps activity   Walk up/down 4 steps assist level: Minimal Assistance - Patient > 75% Walk up/down 4 steps assistive device: 2 hand rails  Walk up/down 12 steps activity Walk up/down 12 steps activity did not occur: Safety/medical concerns      Pick up small objects from floor Pick up small object from the floor (from standing position) activity did not occur: Safety/medical concerns      Wheelchair Is the patient using a wheelchair?: Yes Type of Wheelchair: Manual   Wheelchair assist level: Dependent - Patient 0% Max wheelchair distance: 150  Wheel 50 feet with 2 turns activity Wheelchair 50 feet with 2 turns activity did not occur:  (sternal precautions) Assist Level: Dependent - Patient 0% (sternal precautions)  Wheel 150 feet activity Wheelchair 150 feet activity did not occur:  (sternal precautions) Assist Level: Dependent - Patient 0% (sternal precautions)    Refer to Care Plan for Long Term  Goals  SHORT TERM GOAL WEEK 1 PT Short Term Goal 1 (Week 1): STG = LTG due to ELOS  Recommendations for other services: None   Skilled Therapeutic Intervention Evaluation completed (see details above and below) with education on PT POC and goals and individual treatment initiated  with focus on gait training, stair negotiation, transfer training, and education on energy conservation and sternal precautions. Pt presents in room in recliner, provides subjective information requiring increased time to complete secondary to hyperverbosity. Pt participates with objective portion of evaluation and examination. Pt then completes dressing while seated in recliner, dons shirt with supervision only. Pt requires min assist for donning pants while seated for threading pants over right foot, then completes pulling over hips while in standing with CGA for postural stability with unilateral UE support. Pt completes sit to stand with CGA/min assist for immediate standing balance, verbal cues provided to maintain sternal precautions. Pt then completes ambulatory stand pivot transfer with CGA/light min assist for postural corrections to prevent posterior LOB. Pt then transported dependently to main gym secondary to pt unable to propel WC with sternal precautions. Pt completes stand to RW, verbal cues provided for sternal precautions and ambulates 95' with RW, occasionally demonstrates decreased proximity to RW however self corrects, pt provided with verbal cues to not talk during ambulation for energy conservation and reduce SOB with task with pt occasionally speaking. Pt requests to sit following gait trial due to fatigue and requires extended seated rest break. Vitals assessed with SpO2 98% and HR 66bpm. During rest break pt educated on energy conservation with tasks to reduce SOB. Pt then rolled to stairs where pt completes up/down 4 steps with BHRs, verbal cues for sequencing step to gait pattern and  to maintain sternal precautions with pt using BUEs for balance only. Pt requires light min assist for postural corrections only. Pt requires extended seated rest break following stairs however does not demonstrate SOB. Pt transported dependently from main gym to room and remains seated in [facility] with all needs  within reach and call light in place to await OT session.  Mobility Transfers Transfers: Sit to Stand;Stand to Sit;Stand Pivot Transfers Sit to Stand: Minimal Assistance - Patient > 75% (verbal cues for precautions, min assist for immediate standing balance with pt demonstrate increased anterior/posterior sway) Stand to Sit: Minimal Assistance - Patient > 75% (min assist for postural stability and control, verbal cues for positioning prior to sit) Stand Pivot Transfers: Minimal Assistance - Patient > 75% Stand Pivot Transfer Details: Verbal cues for precautions/safety Stand Pivot Transfer Details (indicate cue type and reason): cues for sternal precautions and postural stability with pt demonstrating increased anterior/posterior sway Transfer (Assistive device): Rolling walker Locomotion  Gait Ambulation: Yes Gait Assistance: Minimal Assistance - Patient > 75% Gait Distance (Feet): 95 Feet Assistive device: Rolling walker Gait Assistance Details: Tactile cues for posture;Verbal cues for precautions/safety;Verbal cues for safe use of DME/AE;Verbal cues for gait pattern (verbal cues to prevent talking during ambulation for energy conservation) Gait Gait: Yes Gait Pattern: Step-to pattern;Decreased stance time - left;Trunk flexed Gait velocity: decreased Stairs / Additional Locomotion Stairs: Yes Stairs Assistance: Minimal Assistance - Patient > 75% (verbal cues for step to gait pattern leading with LLE ascending/RLE descending, min assist for postural corrections with decreased UE support on BHRs) Stair Management Technique: Two rails Number of Stairs: 3 Height of Stairs: 6 Wheelchair Mobility Wheelchair Mobility: No   Discharge Criteria: Patient will be discharged from PT if patient refuses treatment 3  consecutive times without medical reason, if treatment goals not met, if there is a change in medical status, if patient makes no progress towards goals or if patient is discharged from  hospital.  The above assessment, treatment plan, treatment alternatives and goals were discussed and mutually agreed upon: by patient  Edwin Cap PT, DPT 02/05/2023, 1:03 PM

## 2023-02-05 NOTE — Plan of Care (Signed)
  Problem: RH Balance Goal: LTG Patient will maintain dynamic standing with ADLs (OT) Description: LTG:  Patient will maintain dynamic standing balance with assist during activities of daily living (OT)  Flowsheets (Taken 02/05/2023 1311) LTG: Pt will maintain dynamic standing balance during ADLs with: Independent with assistive device   Problem: Sit to Stand Goal: LTG:  Patient will perform sit to stand in prep for activites of daily living with assistance level (OT) Description: LTG:  Patient will perform sit to stand in prep for activites of daily living with assistance level (OT) Flowsheets (Taken 02/05/2023 1311) LTG: PT will perform sit to stand in prep for activites of daily living with assistance level: Independent with assistive device   Problem: RH Grooming Goal: LTG Patient will perform grooming w/assist,cues/equip (OT) Description: LTG: Patient will perform grooming with assist, with/without cues using equipment (OT) Flowsheets (Taken 02/05/2023 1311) LTG: Pt will perform grooming with assistance level of: Independent   Problem: RH Bathing Goal: LTG Patient will bathe all body parts with assist levels (OT) Description: LTG: Patient will bathe all body parts with assist levels (OT) Flowsheets (Taken 02/05/2023 1311) LTG: Pt will perform bathing with assistance level/cueing: Independent with assistive device    Problem: RH Dressing Goal: LTG Patient will perform upper body dressing (OT) Description: LTG Patient will perform upper body dressing with assist, with/without cues (OT). Flowsheets (Taken 02/05/2023 1311) LTG: Pt will perform upper body dressing with assistance level of: Independent Goal: LTG Patient will perform lower body dressing w/assist (OT) Description: LTG: Patient will perform lower body dressing with assist, with/without cues in positioning using equipment (OT) Flowsheets (Taken 02/05/2023 1311) LTG: Pt will perform lower body dressing with assistance level of:  Independent with assistive device   Problem: RH Toileting Goal: LTG Patient will perform toileting task (3/3 steps) with assistance level (OT) Description: LTG: Patient will perform toileting task (3/3 steps) with assistance level (OT)  Flowsheets (Taken 02/05/2023 1311) LTG: Pt will perform toileting task (3/3 steps) with assistance level: Independent with assistive device   Problem: RH Simple Meal Prep Goal: LTG Patient will perform simple meal prep w/assist (OT) Description: LTG: Patient will perform simple meal prep with assistance, with/without cues (OT). Flowsheets (Taken 02/05/2023 1311) LTG: Pt will perform simple meal prep with assistance level of: Independent with assistive device LTG: Pt will perform simple meal prep w/level of: Ambulate with device   Problem: RH Toilet Transfers Goal: LTG Patient will perform toilet transfers w/assist (OT) Description: LTG: Patient will perform toilet transfers with assist, with/without cues using equipment (OT) Flowsheets (Taken 02/05/2023 1311) LTG: Pt will perform toilet transfers with assistance level of: Independent with assistive device

## 2023-02-05 NOTE — Discharge Summary (Signed)
Physician Discharge Summary  Patient ID: Bryan Brock MRN: [id number] DOB/AGE: [birth year] 67 y.o.  Admit date: 02/04/2023 Discharge date: 02/11/2023  Discharge Diagnoses:  Active Problems:   Primary hypertension   Debility   Drug-induced constipation   Type 2 diabetes mellitus without complication, without long-term current use of insulin (HCC)   Chest wall pain Debility due to coronary artery disease status post CABG and resulting sternal wound and right pectoralis major muscle advancement flap Polar 1 disorder Hypertension Hyperlipidemia Diabetes mellitus type 2 Tobacco use Cough Postoperative atrial fibrillation with RVR GERD Acute blood loss anemia Hyponatremia  Discharged Condition: good  Significant Diagnostic Studies: none  Labs:  Basic Metabolic Panel: Recent Labs  Lab 02/05/23 0611 02/10/23 0558  NA 136 140  K 4.0 5.2*  CL 102 101  CO2 25 19*  GLUCOSE 114* 103*  BUN 16 24*  CREATININE 1.20 1.05  CALCIUM 8.4* 8.9    CBC: Recent Labs  Lab 02/05/23 0611 02/10/23 0558  WBC 5.2 5.7  NEUTROABS 3.0  --   HGB 9.7* 10.7*  HCT 32.6* 34.4*  MCV 94.5 91.5  PLT 381 316    CBG: Recent Labs  Lab 02/06/23 2136 02/07/23 0634 02/07/23 1138 02/07/23 1605 02/08/23 1131  GLUCAP 111* 100* 136* 125* 103*    Brief HPI:   Bryan Brock is a 67 y.o. male who was evaluated earlier this year by Dr. Lavinia Sharps concerning coronary artery disease.  He underwent extensive workup and was found to have multivessel disease consisting of serial right coronary artery lesions, high-grade left circumflex disease, are FFR positive LAD D disease and moderate second diagonal disease.  He was counseled regarding recommendations for outpatient cardiothoracic surgical evaluation.  Echocardiogram showed normal left ventricular systolic function and no other valvular abnormality.  He was counseled regarding recommendations for coronary bypass graft surgery is the best treatment for  resolution of his exertional fatigue and shortness of breath and preserve myocardium.  On 5/09, he underwent coronary artery bypass grafting x 4 with endoscopic vein harvest from right and left legs.  He was transferred to the ICU for observation and extubated on 5/09.  Required blood pressure support and was not diuresed.  He was transferred to stepdown unit on 5/11.  He developed a cough and was not using his pillow to stabilize his chest.  The patient was strongly advised regarding risk of sternal dehiscence is higher with morbid obesity.  CT chest was obtained to rule out sternal dehiscence.  On 5/14,  he was returned to the operating room and underwent sternal rewiring.  He was noted to be in atrial fibrillation with controlled rate in the 80s.  Amiodarone was switched to intravenous administration.  Was extubated on 5/15. Chest tubes discontinued 5/20. Some drainage from sternal incision prompted CT examination on 5/22. Chest CT showed displacement of 3rd, 4th and 5th ribs with similar air and fluid levels around the site. Continued dressing changes and Keflex prophylactically .  Diuresed.  He underwent left pleurocentesis on 5/26.  Repeat chest CT 5/26 failed significant anterior chest wall collection on the left and complete dehiscence of the left ribs from the sternum.  He was returned to the operating room on 5/27 and underwent I&D of peristernal fluid collection and placement of wound VAC.  Continued on cefepime and vancomycin and follow-up wound cultures.  Plastic surgery consultation was obtained on 5/29 and he was returned to the operating room on 5/30 and underwent excision of sternal wound skin and  soft tissue and intermediate closure of the wound with placement of wound VAC by Dr. Ulice Bold.  He underwent this procedure again on 6/05.  On 6/07 he underwent right pectoralis major muscle advancement flap and placement of wound VAC by Dr. Ulice Bold.  He is voiding well and tolerating a carb modified  diet.he reports his pain is controlled with current regimen of gabapentin, as needed oxycodone, as needed tramadol.  Reports shortness of breath with activities.    Hospital Course: Bryan Brock was admitted to rehab 02/04/2023 for inpatient therapies to consist of PT, ST and OT at least three hours five days a week. Past admission physiatrist, therapy team and rehab RN have worked together to provide customized collaborative inpatient rehab. Follow-up labs show serum sodium of 136, H and H improved. Pain management and constipation addressed. Plastic surgery followed-up on 6/14: serosanguineous drain output and good seal on VAC. Drain sites without infection. Dicussed tapering of oxycodone and switching to taper of tramadol at discharge. H and H improving.    Blood pressures were monitored on TID basis and Lopressor 37.5 mg BID, Lasix 40 mg daily and Pacerone 200 mg daily  Diabetes has been monitored with ac/hs CBG checks and SSI was use prn for tighter BS control. Continue carb modified diet at discharge   Rehab course: During patient's stay in rehab weekly team conferences were held to monitor patient's progress, set goals and discuss barriers to discharge. At admission, patient required  min - mod with basic self-care skills and min with mobility   He has had improvement in activity tolerance, balance, postural control as well as ability to compensate for deficits. He has had improvement in functional use RUE/LUE  and RLE/LLE as well as improvement in awarenessPt completes stand to RW and ambulates ~200' from room to far end of main gym supervision, pt would be modI except therapist manages wound vac. Home health arranged with Frances Furbish PT, OT and RN.Marland Kitchen   Discharge disposition: 01-Home or Self Care     Diet: heart healthy  Special Instructions: No driving, alcohol consumption or tobacco use.  -Will adjust oxycodone to 5-10 every 4 hours, plan to d/c home on Oxy IR 5mg   (#30)take 1-2 BID x 3 d  , then 1 BID x 3 d , then 1 every day x1 day then change to tramadol 50mg  (#30)  1 po TID x 3d, BID x 3 d then daily x 1 d   Cont Gabapentin 200mg  BID   30-35 minutes were spent on discharge planning and discharge summary. Discharge Instructions     Discharge patient   Complete by: As directed    Discharge disposition: 01-Home or Self Care   Discharge patient date: 02/11/2023      Allergies as of 02/11/2023       Reactions   Latex Dermatitis   Lisinopril Cough        Medication List     STOP taking these medications    budesonide 0.5 MG/2ML nebulizer solution Commonly known as: PULMICORT   chlorpheniramine-HYDROcodone 10-8 MG/5ML Commonly known as: TUSSIONEX   guaiFENesin 600 MG 12 hr tablet Commonly known as: MUCINEX   magnesium oxide 400 MG tablet Commonly known as: MAG-OX   potassium chloride SA 20 MEQ tablet Commonly known as: KLOR-CON M   Voltaren 1 % Gel Generic drug: diclofenac Sodium       TAKE these medications    acetaminophen 325 MG tablet Commonly known as: TYLENOL Take 1-2 tablets (325-650 mg  total) by mouth every 4 (four) hours as needed for mild pain.   albuterol 108 (90 Base) MCG/ACT inhaler Commonly known as: VENTOLIN HFA Inhale 2 puffs into the lungs every 6 (six) hours as needed for wheezing or shortness of breath. What changed:  how much to take Another medication with the same name was removed. Continue taking this medication, and follow the directions you see here.   amiodarone 200 MG tablet Commonly known as: PACERONE Take 1 tablet (200 mg total) by mouth daily.   APPLE CIDER VINEGAR PO Take 15 mLs by mouth every Monday, Wednesday, and Friday.   aspirin EC 325 MG tablet Take 1 tablet (325 mg total) by mouth daily.   BLUE-EMU SUPER STRENGTH EX Apply 1 Application topically daily as needed (pain).   CoQ-10 100 MG Caps Take 100 mg by mouth daily.   Dialyvite Vitamin D 5000 125 MCG (5000 UT) capsule Generic drug:  Cholecalciferol Take 20,000 Units by mouth daily.   docusate sodium 100 MG capsule Commonly known as: COLACE Take 2 capsules (200 mg total) by mouth daily. Start taking on: February 12, 2023   Flax Seed Oil 1000 MG Caps Take 1,000 mg by mouth daily.   folic acid 800 MCG tablet Commonly known as: FOLVITE Take 800 mcg by mouth daily.   furosemide 40 MG tablet Commonly known as: LASIX Take 1 tablet (40 mg total) by mouth daily.   gabapentin 100 MG capsule Commonly known as: NEURONTIN Take 2 capsules (200 mg total) by mouth 2 (two) times daily.   lamoTRIgine 100 MG tablet Commonly known as: LAMICTAL Take 100 mg by mouth daily.   Metoprolol Tartrate 37.5 MG Tabs Take 1 tablet (37.5 mg total) by mouth 2 (two) times daily.   multivitamin with minerals tablet Take 1 tablet by mouth daily.   nicotine 14 mg/24hr patch Commonly known as: NICODERM CQ - dosed in mg/24 hours Place 1 patch (14 mg total) onto the skin daily.   Omega 3 1000 MG Caps Take 1,000 mg by mouth daily.   OVER THE COUNTER MEDICATION Nano CBD OIL: Pt takes 20 drops in the morning and 20 drops in the evening.   oxyCODONE 5 MG immediate release tablet Commonly known as: Oxy IR/ROXICODONE Take 1-2 BID x 3 d , then 1 BID x 3 d , then 1 every day x1 day then change to tramadol What changed:  how much to take how to take this when to take this reasons to take this additional instructions   polyethylene glycol 17 g packet Commonly known as: MIRALAX / GLYCOLAX Take 17 g by mouth 2 (two) times daily. What changed:  when to take this reasons to take this   rosuvastatin 10 MG tablet Commonly known as: CRESTOR Take 1 tablet (10 mg total) by mouth daily.   sildenafil 100 MG tablet Commonly known as: VIAGRA Take 100 mg by mouth daily as needed for erectile dysfunction.   traMADol 50 MG tablet Commonly known as: ULTRAM 1 po TID x 3d, BID x 3 d then daily x 1 d   Tums Ultra 1000 400 MG chewable  tablet Generic drug: calcium elemental as carbonate Chew 2,000 mg by mouth 3 (three) times daily as needed for heartburn.   vitamin C 1000 MG tablet Take 3,000 mg by mouth daily.   zinc gluconate 50 MG tablet Take 50 mg by mouth daily.        Follow-up Information     Rudd, Bertram Millard, MD.  Go to.   Specialty: Family Medicine Contact information: [address] [place] [address] [phone]         Meriam Sprague, MD Follow up.   Specialties: Cardiology, Radiology Why: Call in 1-2 days to make hospital follow-up appointment. Contact information: [address] [place] [address] [phone]         [name], MD. Go to.   Specialty: Cardiothoracic Surgery Contact information: [address] [place] [address] [phone]         Peggye Form, DO Follow up.   Specialty: Plastic Surgery Why: Call in 1-2 days to make hospital follow-up appointment. Contact information: [address] [place] [address] [phone]         Erick Colace, MD Follow up.   Specialty: Physical Medicine and Rehabilitation Why: As needed Contact information: [address] [place] [address] [phone]                 Signed: Milinda Antis 02/11/2023, 9:14 AM

## 2023-02-05 NOTE — Progress Notes (Signed)
Inpatient Rehabilitation Center Individual Statement of Services  Patient Name:  Bryan Brock  Date:  02/05/2023  Welcome to the Inpatient Rehabilitation Center.  Our goal is to provide you with an individualized program based on your diagnosis and situation, designed to meet your specific needs.  With this comprehensive rehabilitation program, you will be expected to participate in at least 3 hours of rehabilitation therapies Monday-Friday, with modified therapy programming on the weekends.  Your rehabilitation program will include the following services:  Physical Therapy (PT), Occupational Therapy (OT), Speech Therapy (ST), 24 hour per day rehabilitation nursing, Therapeutic Recreaction (TR), Neuropsychology, Care Coordinator, Rehabilitation Medicine, Nutrition Services, Pharmacy Services, and Other  Weekly team conferences will be held on Wednesdays to discuss your progress.  Your Inpatient Rehabilitation Care Coordinator will talk with you frequently to get your input and to update you on team discussions.  Team conferences with you and your family in attendance may also be held.  Expected length of stay: 5-7 Days  Overall anticipated outcome:  MOD I  Depending on your progress and recovery, your program may change. Your Inpatient Rehabilitation Care Coordinator will coordinate services and will keep you informed of any changes. Your Inpatient Rehabilitation Care Coordinator's name and contact numbers are listed  below.  The following services may also be recommended but are not provided by the Inpatient Rehabilitation Center:   Home Health Rehabiltiation Services Outpatient Rehabilitation Services    Arrangements will be made to provide these services after discharge if needed.  Arrangements include referral to agencies that provide these services.  Your insurance has been verified to be:   Medicare A & B Your primary doctor is:  Herbie Drape, MD  Pertinent information will be  shared with your doctor and your insurance company.  Inpatient Rehabilitation Care Coordinator:  Lavera Guise, [place] [phone] or [phone]  Information discussed with and copy given to patient by: Andria Rhein, 02/05/2023, 9:34 AM

## 2023-02-05 NOTE — Discharge Instructions (Addendum)
Inpatient Rehab Discharge Instructions  Bryan Brock Discharge date and time:  02/11/2023  Activities/Precautions/ Functional Status: Activity: no lifting, driving, or strenuous exercise until cleared by MD Wound Care: as directed; if drain bulbs fill, empty contents and record approximate amount; call Dr. Kittie Plater office for questions regarding wound VAC Functional status:  ___ No restrictions     ___ Walk up steps independently ___ 24/7 supervision/assistance   ___ Walk up steps with assistance __x_ Intermittent supervision/assistance  ___ Bathe/dress independently ___ Walk with walker     ___ Bathe/dress with assistance ___ Walk Independently    ___ Shower independently ___ Walk with assistance    __x_ Shower with assistance _x__ No alcohol     ___ Return to work/school ________  Special Instructions: No driving, alcohol consumption or tobacco use.  Recommend daily BP measurement in same arm and record time of day. Bring this information with you to follow-up appointment with PCP.   COMMUNITY REFERRALS UPON DISCHARGE:    Home Health:   PT     OT      RN                   Agency: [facility]  Phone: [phone] *Please expect follow-up within 2-3 days to schedule the home visit. If you have not received follow-up, be sure to contact the branch directly.*   Medical Equipment/Items Ordered: rolling walker                                                 Agency/Supplier: [employer] [facility] [phone]    My questions have been answered and I understand these instructions. I will adhere to these goals and the provided educational materials after my discharge from the hospital.  Patient/Caregiver Signature _______________________________ Date __________  Clinician Signature _______________________________________ Date __________  Please bring this form and your medication list with you to all your follow-up doctor's appointments.

## 2023-02-05 NOTE — Progress Notes (Signed)
Inpatient Rehabilitation Admission Medication Review by a Pharmacist  A complete drug regimen review was completed for this patient to identify any potential clinically significant medication issues.  High Risk Drug Classes Is patient taking? Indication by Medication  Antipsychotic No   Anticoagulant Yes Lovenox - VTE ppx  Antibiotic No   Opioid Yes Oxycodone - prn severe pain Tramadol - prn moderate pain  Antiplatelet No   Hypoglycemics/insulin Yes Insulin - DM (new)  Vasoactive Medication Yes Amiodarone, metoprolol- Afib, HTN Furosemide - fluid  Chemotherapy No   Other Yes Budesonide - COPD Tussionex- cough Gabapentin - pain Lamotrigine - Mood Pantoprazole - Reflux  Rosuvastatin - HLD Albuterol - prn SOB Methocarbamol - prn spasms Trazodone - prn sleep     Type of Medication Issue Identified Description of Issue Recommendation(s)  Drug Interaction(s) (clinically significant)     Duplicate Therapy     Allergy     No Medication Administration End Date     Incorrect Dose     Additional Drug Therapy Needed     Significant med changes from prior encounter (inform family/care partners about these prior to discharge).    Other       Clinically significant medication issues were identified that warrant physician communication and completion of prescribed/recommended actions by midnight of the next day:  No  Pharmacist comments: None  Time spent performing this drug regimen review (minutes):  20 minutes  Okey Regal, PharmD

## 2023-02-05 NOTE — Plan of Care (Signed)
  Problem: RH Balance Goal: LTG Patient will maintain dynamic standing balance (PT) Description: LTG:  Patient will maintain dynamic standing balance with assistance during mobility activities (PT) Flowsheets (Taken 02/05/2023 1631) LTG: Pt will maintain dynamic standing balance during mobility activities with:: Independent with assistive device  Note: With RW   Problem: Sit to Stand Goal: LTG:  Patient will perform sit to stand with assistance level (PT) Description: LTG:  Patient will perform sit to stand with assistance level (PT) Flowsheets (Taken 02/05/2023 1631) LTG: PT will perform sit to stand in preparation for functional mobility with assistance level: Independent with assistive device Note: With RW   Problem: RH Bed Mobility Goal: LTG Patient will perform bed mobility with assist (PT) Description: LTG: Patient will perform bed mobility with assistance, with/without cues (PT). Flowsheets (Taken 02/05/2023 1631) LTG: Pt will perform bed mobility with assistance level of: Independent   Problem: RH Bed to Chair Transfers Goal: LTG Patient will perform bed/chair transfers w/assist (PT) Description: LTG: Patient will perform bed to chair transfers with assistance (PT). Flowsheets (Taken 02/05/2023 1631) LTG: Pt will perform Bed to Chair Transfers with assistance level: Independent with assistive device  Note: With RW   Problem: RH Car Transfers Goal: LTG Patient will perform car transfers with assist (PT) Description: LTG: Patient will perform car transfers with assistance (PT). Flowsheets (Taken 02/05/2023 1631) LTG: Pt will perform car transfers with assist:: Independent with assistive device  Note: With RW   Problem: RH Ambulation Goal: LTG Patient will ambulate in controlled environment (PT) Description: LTG: Patient will ambulate in a controlled environment, # of feet with assistance (PT). Flowsheets (Taken 02/05/2023 1631) LTG: Pt will ambulate in controlled environ  assist  needed:: Supervision/Verbal cueing LTG: Ambulation distance in controlled environment: 150' Note: With RW Goal: LTG Patient will ambulate in home environment (PT) Description: LTG: Patient will ambulate in home environment, # of feet with assistance (PT). Flowsheets (Taken 02/05/2023 1631) LTG: Pt will ambulate in home environ  assist needed:: Independent with assistive device LTG: Ambulation distance in home environment: 19' Note: With RW   Problem: RH Stairs Goal: LTG Patient will ambulate up and down stairs w/assist (PT) Description: LTG: Patient will ambulate up and down # of stairs with assistance (PT) Flowsheets (Taken 02/05/2023 1631) LTG: Pt will ambulate up/down stairs assist needed:: Supervision/Verbal cueing LTG: Pt will  ambulate up and down number of stairs: 5 Note: With unilateral HR

## 2023-02-05 NOTE — Progress Notes (Signed)
Patient ID: Bryan Brock, male   DOB: 06-22-1956, 67 y.o.   MRN: [id number]  Team Conference Report to Patient/Family  Team Conference discussion was reviewed with the patient and caregiver, including goals, any changes in plan of care and target discharge date.  Patient and caregiver express understanding and are in agreement.  The patient has a target discharge date of 02/11/23 (evals pending).  SW met with patient, introduced self and explained role. Patient anticipates discharging home MOD I, due to S.O working during the day. Patient will require a standard RW due to bathroom size. Standard RW ordered through [employer]. SW will follow up with patient on d/c date. No additional questions or concerns. Andria Rhein 02/05/2023, 2:01 PM

## 2023-02-05 NOTE — Evaluation (Signed)
Occupational Therapy Assessment and Plan  Patient Details  Name: Bryan Brock MRN: [id number] Date of Birth: 1955-12-26  OT Diagnosis: acute pain and muscle weakness (generalized) Rehab Potential: Rehab Potential (ACUTE ONLY): Excellent ELOS: 7-9 days   Today's Date: 02/05/2023 OT Individual Time: 4098-1191 OT Individual Time Calculation (min): 60 min     Hospital Problem: Active Problems:   Debility   Past Medical History:  Past Medical History:  Diagnosis Date   Acne    ADHD (attention deficit hyperactivity disorder)    Arthritis    Asthma    Bipolar 1 disorder (HCC)    Cancer (HCC)    basal cell cancer removed from back   Cataract    Coronary artery disease    Depression    Dyspnea    Dysrhythmia    PVCs   GERD (gastroesophageal reflux disease)    Heart murmur    Hepatitis    remote hx Hepatitis A (caused by food contaminant in childhood)   History of hiatal hernia    Hyperlipidemia    Hypertension    Mitral valve prolapse    Panic attacks    Peripheral vascular disease (HCC)    PFO (patent foramen ovale)    ? small PFO per echo   PONV (postoperative nausea and vomiting)    Pre-diabetes    Sleep apnea    Past Surgical History:  Past Surgical History:  Procedure Laterality Date   APPLICATION OF WOUND VAC N/A 01/20/2023   Procedure: APPLICATION OF WOUND VAC;  Surgeon: Lovett Sox, MD;  [facility];  Service: Thoracic;  Laterality: N/A;   APPLICATION OF WOUND VAC N/A 01/23/2023   Procedure: APPLICATION OF WOUND VAC;  Surgeon: Peggye Form, DO;  [facility];  Service: Plastics;  Laterality: N/A;   APPLICATION OF WOUND VAC N/A 01/29/2023   Procedure: APPLICATION OF WOUND VAC;  Surgeon: Peggye Form, DO;  [facility];  Service: Plastics;  Laterality: N/A;   CORONARY ARTERY BYPASS GRAFT N/A 01/02/2023   Procedure: CORONARY ARTERY BYPASS GRAFTING (CABG) TIMES FOUR USING THE LEFT INTERNAL MAMMARY ARTERY (LIMA) AND BILATERAL GREATER  SAPHENOUS VEIN ARTERIES;  Surgeon: [name], MD;  [facility];  Service: Open Heart Surgery;  Laterality: N/A;  Open median sternotomy   CORONARY PRESSURE/FFR STUDY N/A 11/21/2022   Procedure: INTRAVASCULAR PRESSURE WIRE/FFR STUDY;  Surgeon: Orbie Pyo, MD;  [facility];  Service: Cardiovascular;  Laterality: N/A;   HYDROCELE EXCISION Bilateral 03/22/2022   Procedure: HYDROCELECTOMY ADULT;  Surgeon: Noel Christmas, MD;  Location: [facility];  Service: Urology;  Laterality: Bilateral;  2 HRS   INCISION AND DRAINAGE OF WOUND N/A 01/29/2023   Procedure: Sternal wound irrigation and debridement, placement of myriad and wound VAC change;  Surgeon: Peggye Form, DO;  [facility];  Service: Plastics;  Laterality: N/A;   INCISION AND DRAINAGE OF WOUND N/A 01/31/2023   Procedure: IRRIGATION AND DEBRIDEMENT OF STERNAL WOUND;  Surgeon: Peggye Form, DO;  [facility];  Service: Plastics;  Laterality: N/A;   LEFT HEART CATH AND CORONARY ANGIOGRAPHY N/A 11/21/2022   Procedure: LEFT HEART CATH AND CORONARY ANGIOGRAPHY;  Surgeon: Orbie Pyo, MD;  [facility];  Service: Cardiovascular;  Laterality: N/A;   MOHS SURGERY  1990   PECTORALIS FLAP Right 01/31/2023   Procedure: RIGHT PECTORALIS FLAP;  Surgeon: Peggye Form, DO;  [facility];  Service: Plastics;  Laterality: Right;  STERNAL INCISION RECLOSURE N/A 01/07/2023   Procedure: STERNAL REWIRING;  Surgeon: [name], MD;  [facility];  Service: Thoracic;  Laterality: N/A;   STERNAL WOUND DEBRIDEMENT N/A 01/20/2023   Procedure: DRAIN STERNAL INFECTION;  Surgeon: Lovett Sox, MD;  Location: [facility] OR;  Service: Thoracic;  Laterality: N/A;   STERNAL WOUND DEBRIDEMENT N/A 01/23/2023   Procedure: Excision of sternal wound with Myriad;  Surgeon: Peggye Form, DO;  [facility];  Service: Plastics;  Laterality: N/A;   STRABISMUS SURGERY Left    TEE WITHOUT  CARDIOVERSION N/A 01/02/2023   Procedure: TRANSESOPHAGEAL ECHOCARDIOGRAM;  Surgeon: [name], MD;  Location: [facility] OR;  Service: Open Heart Surgery;  Laterality: N/A;   TONSILLECTOMY AND ADENOIDECTOMY     WISDOM TOOTH EXTRACTION      Assessment & Plan Clinical Impression: Bryan Brock is a 67 year old male who was evaluated earlier this year by Dr. Lavinia Sharps concerning coronary artery disease.  He underwent extensive workup and was found to have multivessel disease consisting of serial right coronary artery lesions, high-grade left circumflex disease, are FFR positive LAD D disease and moderate second diagonal disease.  He was counseled regarding recommendations for outpatient cardiothoracic surgical evaluation.  Echocardiogram showed normal left ventricular systolic function and no other valvular abnormality.  He was counseled regarding recommendations for coronary bypass graft surgery is the best treatment for resolution of his exertional fatigue and shortness of breath and preserve myocardium.  On 5/09, he underwent coronary artery bypass grafting x 4 with endoscopic vein harvest from right and left legs.  He was transferred to the ICU for observation and extubated on 5/09.  Required blood pressure support and was not diuresed.  He was transferred to stepdown unit on 5/11.  He developed a cough and was not using his pillow to stabilize his chest.  The patient was strongly advised regarding risk of sternal dehiscence is higher with morbid obesity.  CT chest was obtained to rule out sternal dehiscence.  On 5/14,  he was returned to the operating room and underwent sternal rewiring.  He was noted to be in atrial fibrillation with controlled rate in the 80s.  Amiodarone was switched to intravenous administration.  Was extubated on 5/15. Chest tubes discontinued 5/20. Some drainage from sternal incision prompted CT examination on 5/22. Chest CT showed displacement of 3rd, 4th and 5th ribs with similar air and  fluid levels around the site. Continued dressing changes and Keflex prophylactically .  Diuresed.  He underwent left pleurocentesis on 5/26.  Repeat chest CT 5/26 failed significant anterior chest wall collection on the left and complete dehiscence of the left ribs from the sternum.  He was returned to the operating room on 5/27 and underwent I&D of peristernal fluid collection and placement of wound VAC.  Continued on cefepime and vancomycin and follow-up wound cultures.  Plastic surgery consultation was obtained on 5/29 and he was returned to the operating room on 5/30 and underwent excision of sternal wound skin and soft tissue and intermediate closure of the wound with placement of wound VAC by Dr. Ulice Bold.  He underwent this procedure again on 6/05.  On 6/07 he underwent right pectoralis major muscle advancement flap and placement of wound VAC by Dr. Ulice Bold.  He is voiding well and tolerating a carb modified diet.he reports his pain is controlled with current regimen of gabapentin, as needed oxycodone, as needed tramadol.  Reports shortness of breath with activities.  The patient requires inpatient medicine and  rehabilitation evaluations and services for ongoing dysfunction secondary to coronary artery disease status post CABG with sternal dehiscence, pectoralis major muscle flap.   Works part-time in [employer]. Lives with wife who works full-time. He is a non-smoker but does use smokeless tobacco products..  Patient transferred to CIR on 02/04/2023 .    Patient currently requires min - mod with basic self-care skills secondary to muscle weakness and sternal precautions, edema in LEs, decreased cardiorespiratoy endurance, and decreased standing balance, decreased postural control, and decreased balance strategies.  Prior to hospitalization, patient was fully independent.   Patient will benefit from skilled intervention to increase independence with basic self-care skills prior to discharge home  with care partner.  Anticipate patient will require intermittent supervision and follow up home health.  OT - End of Session Activity Tolerance: Tolerates 10 - 20 min activity with multiple rests Endurance Deficit: Yes Endurance Deficit Description: pt is very talkative which tends to make him more fatigued or out of breath with activity OT Assessment Rehab Potential (ACUTE ONLY): Excellent OT Patient demonstrates impairments in the following area(s): Balance;Endurance;Edema;Pain OT Basic ADL's Functional Problem(s): Bathing;Dressing;Toileting;Grooming OT Advanced ADL's Functional Problem(s): Simple Meal Preparation OT Transfers Functional Problem(s): Toilet OT Additional Impairment(s): None OT Plan OT Intensity: Minimum of 1-2 x/day, 45 to 90 minutes OT Frequency: 5 out of 7 days OT Duration/Estimated Length of Stay: 7-9 days OT Treatment/Interventions: Balance/vestibular training;Discharge planning;DME/adaptive equipment instruction;Functional mobility training;Pain management;Patient/family education;Psychosocial support;Self Care/advanced ADL retraining;UE/LE Strength taining/ROM;Therapeutic Exercise;Therapeutic Activities;UE/LE Coordination activities OT Self Feeding Anticipated Outcome(s): independent OT Basic Self-Care Anticipated Outcome(s): Modified independent OT Toileting Anticipated Outcome(s): Modified independent OT Bathroom Transfers Anticipated Outcome(s): Modified Independent OT Recommendation Patient destination: Home Follow Up Recommendations: Home health OT Equipment Recommended: None recommended by OT Equipment Details: wife has already purchased a shower chair for when he needs it in the future   OT Evaluation Precautions/Restrictions  Precautions Precautions: Sternal;Fall Precaution Comments: Watch SpO2, sternal wound vac Restrictions Other Position/Activity Restrictions: sternal precs; pt has chest binder in place around sternal incision.    Pain  5/10  chest pain from surgical site - premedicated  Home Living/Prior Functioning Home Living Family/patient expects to be discharged to:: Private residence Living Arrangements: Spouse/significant other Available Help at Discharge: Family, Available PRN/intermittently Type of Home: House Home Access: Stairs to enter Secretary/administrator of Steps: 5 Entrance Stairs-Rails: Left, Right Home Layout: Two level, Able to live on main level with bedroom/bathroom Bathroom Shower/Tub: Tub/shower unit, Engineer, building services: Handicapped height Bathroom Accessibility: Yes Additional Comments: significant other was ordering RW and shower seat  Lives With: Significant other Prior Function Level of Independence: Independent with basic ADLs, Independent with gait  Able to Take Stairs?: Yes (step to gait pattern due to R knee arthritis) Driving: Yes Vocation: Retired Gaffer: works part time at [employer] Baseline Vision/History: 1 Wears glasses (reading only) Ability to See in Adequate Light: 0 Adequate Patient Visual Report: No change from baseline Vision Assessment?: No apparent visual deficits Perception  Perception: Within Functional Limits Praxis Praxis: Intact Cognition Cognition Overall Cognitive Status: Within Functional Limits for tasks assessed Arousal/Alertness: Awake/alert Orientation Level: Person;Place;Situation Person: Oriented Place: Oriented Situation: Oriented Memory: Appears intact Attention: Selective Awareness: Appears intact Problem Solving: Appears intact Safety/Judgment: Appears intact Brief Interview for Mental Status (BIMS) Repetition of Three Words (First Attempt): 3 Temporal Orientation: Year: Correct Temporal Orientation: Month: Accurate within 5 days Temporal Orientation: Day: Correct Recall: "Sock": Yes, no cue required Recall: "Blue": Yes, no cue required  Recall: "Bed": Yes, no cue required BIMS Summary Score:  15 Sensation Sensation Light Touch: Appears Intact Hot/Cold: Appears Intact Proprioception: Appears Intact Stereognosis: Not tested Coordination Gross Motor Movements are Fluid and Coordinated: No Coordination and Movement Description: generalized weakness Motor  Motor Motor: Within Functional Limits  Trunk/Postural Assessment  Postural Control Postural Control: Deficits on evaluation Trunk Control: minimal posterior lean in standing, tends to be more pronounced with initial sit to stand, tends to put weight on his heels and keeps knees extended  Balance Static Standing Balance Static Standing - Level of Assistance: 5: Stand by assistance (with UE support on RW) Dynamic Standing Balance Dynamic Standing - Level of Assistance: 4: Min assist Extremity/Trunk Assessment RUE Assessment RUE Assessment: Within Functional Limits LUE Assessment LUE Assessment: Within Functional Limits  Care Tool Care Tool Self Care Eating   Eating Assist Level: Independent    Oral Care    Oral Care Assist Level: Set up assist    Bathing   Body parts bathed by patient: Right arm;Left arm;Front perineal area;Right upper leg;Left upper leg;Face Body parts bathed by helper: Chest;Abdomen Body parts n/a: Right lower leg;Left lower leg;Buttocks Assist Level: Minimal Assistance - Patient > 75%    Upper Body Dressing(including orthotics)   What is the patient wearing?: Pull over shirt   Assist Level: Supervision/Verbal cueing    Lower Body Dressing (excluding footwear)   What is the patient wearing?: Pants Assist for lower body dressing: Minimal Assistance - Patient > 75%    Putting on/Taking off footwear   What is the patient wearing?: Ted hose;Non-skid slipper socks (ACE wrap) Assist for footwear: Moderate Assistance - Patient 50 - 74%       Care Tool Toileting Toileting activity   Assist for toileting: Moderate Assistance - Patient 50 - 74%     Care Tool Bed Mobility Roll left and  right activity        Sit to lying activity        Lying to sitting on side of bed activity         Care Tool Transfers Sit to stand transfer   Sit to stand assist level: Minimal Assistance - Patient > 75%    Chair/bed transfer   Chair/bed transfer assist level: Minimal Assistance - Patient > 75%     Toilet transfer   Assist Level: Minimal Assistance - Patient > 75%     Care Tool Cognition  Expression of Ideas and Wants Expression of Ideas and Wants: 4. Without difficulty (complex and basic) - expresses complex messages without difficulty and with speech that is clear and easy to understand  Understanding Verbal and Non-Verbal Content Understanding Verbal and Non-Verbal Content: 4. Understands (complex and basic) - clear comprehension without cues or repetitions   Memory/Recall Ability Memory/Recall Ability : Current season;Location of own room;That he or she is in a hospital/hospital unit;Staff names and faces   Refer to Care Plan for Long Term Goals  SHORT TERM GOAL WEEK 1 OT Short Term Goal 1 (Week 1): STGs = LTGs  Recommendations for other services: None    Skilled Therapeutic Intervention ADL ADL Eating: Set up Grooming: Setup Upper Body Bathing: Setup Lower Body Bathing: Moderate assistance Upper Body Dressing: Setup Lower Body Dressing: Moderate assistance Toileting: Moderate assistance Toilet Transfer: Minimal assistance Toilet Transfer Method: Ambulating     Pt seen for initial evaluation and ADL training with a focus on safe mobility techniques to adhere to sternal precautions.  Reviewed role of OT, discussed  POC and pt's goals, and ELOS. Wrapped L leg with ACE wrap for edema control and Rt leg placed TED hose.  Pt has a ganglion cyst on L foot and can not tolerate pressure of TED hose when being pulled up. Pt worked on sit to stands and transfers.  Very little time spent on ADLS as pt is very hyperverbal and it was difficult getting everything  accomplished during the session.    Pt resting in recliner.  With all needs met and belt alarm set.     Discharge Criteria: Patient will be discharged from OT if patient refuses treatment 3 consecutive times without medical reason, if treatment goals not met, if there is a change in medical status, if patient makes no progress towards goals or if patient is discharged from hospital.  The above assessment, treatment plan, treatment alternatives and goals were discussed and mutually agreed upon: by patient  [facility] 02/05/2023, 1:07 PM

## 2023-02-05 NOTE — Progress Notes (Signed)
Inpatient Rehabilitation  Patient information reviewed and entered into eRehab system by Treylin Burtch M. Hadassa Cermak, M.A., CCC/SLP, PPS Coordinator.  Information including medical coding, functional ability and quality indicators will be reviewed and updated through discharge.    

## 2023-02-05 NOTE — Progress Notes (Signed)
Inpatient Rehabilitation Care Coordinator Assessment and Plan Patient Details  Name: Bryan Brock MRN: [id number] Date of Birth: March 29, 1956  Today's Date: 02/05/2023  Hospital Problems: Active Problems:   Debility  Past Medical History:  Past Medical History:  Diagnosis Date   Acne    ADHD (attention deficit hyperactivity disorder)    Arthritis    Asthma    Bipolar 1 disorder (HCC)    Cancer (HCC)    basal cell cancer removed from back   Cataract    Coronary artery disease    Depression    Dyspnea    Dysrhythmia    PVCs   GERD (gastroesophageal reflux disease)    Heart murmur    Hepatitis    remote hx Hepatitis A (caused by food contaminant in childhood)   History of hiatal hernia    Hyperlipidemia    Hypertension    Mitral valve prolapse    Panic attacks    Peripheral vascular disease (HCC)    PFO (patent foramen ovale)    ? small PFO per echo   PONV (postoperative nausea and vomiting)    Pre-diabetes    Sleep apnea    Past Surgical History:  Past Surgical History:  Procedure Laterality Date   APPLICATION OF WOUND VAC N/A 01/20/2023   Procedure: APPLICATION OF WOUND VAC;  Surgeon: Lovett Sox, MD;  [facility];  Service: Thoracic;  Laterality: N/A;   APPLICATION OF WOUND VAC N/A 01/23/2023   Procedure: APPLICATION OF WOUND VAC;  Surgeon: Peggye Form, DO;  [facility]: [facility];  Service: Plastics;  Laterality: N/A;   APPLICATION OF WOUND VAC N/A 01/29/2023   Procedure: APPLICATION OF WOUND VAC;  Surgeon: Peggye Form, DO;  [facility]: [facility];  Service: Plastics;  Laterality: N/A;   CORONARY ARTERY BYPASS GRAFT N/A 01/02/2023   Procedure: CORONARY ARTERY BYPASS GRAFTING (CABG) TIMES FOUR USING THE LEFT INTERNAL MAMMARY ARTERY (LIMA) AND BILATERAL GREATER SAPHENOUS VEIN ARTERIES;  Surgeon: [name], MD;  [facility]: [facility];  Service: Open Heart Surgery;  Laterality: N/A;  Open median sternotomy   CORONARY PRESSURE/FFR STUDY N/A 11/21/2022    Procedure: INTRAVASCULAR PRESSURE WIRE/FFR STUDY;  Surgeon: Orbie Pyo, MD;  [facility];  Service: Cardiovascular;  Laterality: N/A;   HYDROCELE EXCISION Bilateral 03/22/2022   Procedure: HYDROCELECTOMY ADULT;  Surgeon: Noel Christmas, MD;  Location: [facility];  Service: Urology;  Laterality: Bilateral;  2 HRS   INCISION AND DRAINAGE OF WOUND N/A 01/29/2023   Procedure: Sternal wound irrigation and debridement, placement of myriad and wound VAC change;  Surgeon: Peggye Form, DO;  [facility];  Service: Plastics;  Laterality: N/A;   INCISION AND DRAINAGE OF WOUND N/A 01/31/2023   Procedure: IRRIGATION AND DEBRIDEMENT OF STERNAL WOUND;  Surgeon: Peggye Form, DO;  [facility];  Service: Plastics;  Laterality: N/A;   LEFT HEART CATH AND CORONARY ANGIOGRAPHY N/A 11/21/2022   Procedure: LEFT HEART CATH AND CORONARY ANGIOGRAPHY;  Surgeon: Orbie Pyo, MD;  [facility];  Service: Cardiovascular;  Laterality: N/A;   MOHS SURGERY  1990   PECTORALIS FLAP Right 01/31/2023   Procedure: RIGHT PECTORALIS FLAP;  Surgeon: Peggye Form, DO;  [facility];  Service: Plastics;  Laterality: Right;   STERNAL INCISION RECLOSURE N/A 01/07/2023   Procedure: STERNAL REWIRING;  Surgeon: [name], MD;  [facility];  Service: Thoracic;  Laterality: N/A;   STERNAL WOUND DEBRIDEMENT N/A  01/20/2023   Procedure: DRAIN STERNAL INFECTION;  Surgeon: Lovett Sox, MD;  Location: [facility] OR;  Service: Thoracic;  Laterality: N/A;   STERNAL WOUND DEBRIDEMENT N/A 01/23/2023   Procedure: Excision of sternal wound with Myriad;  Surgeon: Peggye Form, DO;  [facility];  Service: Plastics;  Laterality: N/A;   STRABISMUS SURGERY Left    TEE WITHOUT CARDIOVERSION N/A 01/02/2023   Procedure: TRANSESOPHAGEAL ECHOCARDIOGRAM;  Surgeon: [name], MD;  Location: [facility] OR;  Service: Open Heart Surgery;  Laterality: N/A;   TONSILLECTOMY AND ADENOIDECTOMY      WISDOM TOOTH EXTRACTION     Social History:  reports that he has never smoked. His smokeless tobacco use includes snuff. He reports current alcohol use of about 1.0 - 2.0 standard drink of alcohol per week. He reports that he does not use drugs.  Family / Support Systems Patient Roles: Spouse Spouse/Significant Other: Lucienne Minks (S.O) Children: n/a Other Supports: n/a Anticipated Caregiver: Lucienne Minks Ability/Limitations of Caregiver: Works during the day. Patient needs to meet MOD I goals. 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 Plans: TBD Marine scientist Issues: n/a Guardian/Conservator: n/a   Abuse/Neglect Abuse/Neglect Assessment Can Be Completed: Yes Physical Abuse: Denies Verbal Abuse: Denies Sexual Abuse: Denies Exploitation of patient/patient's resources: Denies Self-Neglect: Denies  Patient response to: Social Isolation - How often do you feel lonely or isolated from those around you?: Never  Emotional Status Pt's affect, behavior and adjustment status: Pleasant, hard worker Recent Psychosocial Issues: Coping Psychiatric History: hx of depression, Bi polar 1 Substance Abuse History: n/a  Patient / Family Perceptions, Expectations & Goals Pt/Family understanding of illness & functional limitations: yes, patient aware Premorbid pt/family roles/activities: Independent Anticipated changes in roles/activities/participation: Plans to remain MOD I due to working S.O. Pt/family expectations/goals: MOD I  Games developer: None Premorbid Home Care/DME Agencies: Other (Comment) ([employer] RW, shower seat) Transportation available at discharge: yes Is the patient able to respond to transportation needs?: Yes In the past 12 months, has lack of transportation kept you from medical appointments or from getting medications?: No In the past 12 months, has lack of transportation kept you from meetings, work, or from getting things needed for daily living?: No Resource referrals recommended: Neuropsychology  Discharge Planning Living Arrangements: Spouse/significant other Support Systems: Spouse/significant other Type of Residence: Private residence (2 level home, 5 steps) Insurance Resources: Electrical engineer Resources: Employment Financial Screen Referred: No Living Expenses: Own Money Management: Patient, Significant Other Does the patient have any problems obtaining your medications?: No Home Management: Independent Patient/Family Preliminary Plans: Plans to continue to manage all cogntive tasks Care Coordinator Barriers to Discharge: Decreased caregiver support, Inaccessible home environment, Lack of/limited family support, Home environment access/layout, Wound Care Care Coordinator Anticipated Follow Up Needs: HH/OP DC Planning Additional Notes/Comments: No 02 use, wound vac Expected length of stay: 5-7 Days  Clinical Impression SW met with patient, introduced self and explained role. Patient anticipates discharging home MOD I, due to S.O working during the day. Patient will require a standard RW due to bathroom size. Standard RW ordered through [employer]. SW will follow up with patient on d/c  date. No additional questions or concerns.    2:20 PM: Patient potential to d/c home Tuesday 6/18.   Andria Rhein 02/05/2023, 2:24 PM

## 2023-02-05 NOTE — Progress Notes (Addendum)
PROGRESS NOTE   Subjective/Complaints:  No pain c/os, pt well aware of sternal precautions  ROS- neg CP, SOB, N/V/D  Objective:   DG CHEST PORT 1 VIEW  Result Date: 02/04/2023 CLINICAL DATA:  Shortness of breath EXAM: PORTABLE CHEST 1 VIEW COMPARISON:  Previous studies including the examination of 02/01/2023 FINDINGS: Transverse diameter of heart is increased. Metallic sutures are seen in sternum suggesting previous cardiac surgery. Increased interstitial markings are seen in left parahilar region and left lower lung field. There is blunting of left lateral CP angle suggesting small effusion. Right lateral CP angle is unremarkable. There is no pneumothorax. IMPRESSION: Cardiomegaly. There are no signs of alveolar pulmonary edema. Increased interstitial markings are seen in left parahilar region and left lower lung fields suggesting asymmetric interstitial edema or interstitial pneumonia. Small left pleural effusion. Electronically Signed   By: Ernie Avena M.D.   On: 02/04/2023 15:31   Recent Labs    02/04/23 0131 02/05/23 0611  WBC 4.6 5.2  HGB 9.1* 9.7*  HCT 29.9* 32.6*  PLT 341 381   Recent Labs    02/04/23 0131 02/05/23 0611  NA 134* 136  K 3.8 4.0  CL 102 102  CO2 26 25  GLUCOSE 115* 114*  BUN 20 16  CREATININE 1.15 1.20  CALCIUM 8.0* 8.4*    Intake/Output Summary (Last 24 hours) at 02/05/2023 0935 Last data filed at 02/05/2023 0745 Gross per 24 hour  Intake 720 ml  Output 1250 ml  Net -530 ml        Physical Exam: Vital Signs Blood pressure 138/66, pulse 77, temperature 98.6 F (37 C), temperature source Oral, resp. rate 17, height 5\' 11"  (1.803 m), weight (!) 144 kg, SpO2 94 %. General: No acute distress Mood and affect are appropriate Heart: Chest with 2 JP drains serosanguinous output  Regular rate and rhythm no rubs murmurs or extra sounds Lungs: Clear to auscultation, breathing unlabored, no  rales or wheezes Abdomen: Positive bowel sounds, soft nontender to palpation, nondistended Extremities: No clubbing, cyanosis, or edema Skin: No evidence of breakdown, no evidence of rash, chest wound with wound vac Neurologic: Cranial nerves II through XII intact, motor strength is 4/5 in bilateral delt  bicep, tricep( sternal prec) , 5/5 grip,4/5 hip flexor, knee extensors, ankle dorsiflexor and plantar flexor Sensory exam normal sensation to light touch and proprioception in bilateral upper and lower extremities  Musculoskeletal:  No joint swelling   Assessment/Plan: 1. Functional deficits which require 3+ hours per day of interdisciplinary therapy in a comprehensive inpatient rehab setting. Physiatrist is providing close team supervision and 24 hour management of active medical problems listed below. Physiatrist and rehab team continue to assess barriers to discharge/monitor patient progress toward functional and medical goals  Care Tool:  Bathing              Bathing assist       Upper Body Dressing/Undressing Upper body dressing        Upper body assist      Lower Body Dressing/Undressing Lower body dressing            Lower body assist       Toileting  Toileting    Toileting assist       Transfers Chair/bed transfer  Transfers assist     Chair/bed transfer assist level: Moderate Assistance - Patient 50 - 74%     Locomotion Ambulation   Ambulation assist              Walk 10 feet activity   Assist           Walk 50 feet activity   Assist           Walk 150 feet activity   Assist           Walk 10 feet on uneven surface  activity   Assist           Wheelchair     Assist               Wheelchair 50 feet with 2 turns activity    Assist            Wheelchair 150 feet activity     Assist          Blood pressure 138/66, pulse 77, temperature 98.6 F (37 C), temperature source  Oral, resp. rate 17, height 5\' 11"  (1.803 m), weight (!) 144 kg, SpO2 94 %.    Medical Problem List and Plan: 1. 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  Antithrombotics: -DVT/anticoagulation:  Pharmaceutical: Lovenox             -antiplatelet therapy: Aspirin 325 mg daily   3. Pain Management: Tylenol, oxycodone, tramadol as needed             -gabapentin 200 mg BID   4. Mood/Behavior/Sleep: LCSW to evaluate and provide emotional support; history of bipolar 1 disorder             -antipsychotic agents: n/a             -continue Lamictal 100 mg daily              5. Neuropsych/cognition: This patient is capable of making decisions on his own behalf.   6. Skin/Wound Care: Routine skin care checks             -continue wound VAC and drain>>follow-up with plastic surgery regarding management   7. Fluids/Electrolytes/Nutrition: Routine Is and Os and follow-up chemistries             -continue Juven, zinc   8: Hypertension: monitor TID and prn.  Monitor with mobility             -continue Lopressor 37.5 mg BID             -continue Lasix 40 mg daily             -continue Pacerone 200 mg daily Vitals:   02/05/23 0706 02/05/23 0814  BP:  138/66  Pulse:  77  Resp:    Temp:    SpO2: 94%       9: Hyperlipidemia: continue Crestor 10 mg daily   10: DM-2: CBGs QID, carb modified diet, A1c = 6.6% (new  diagnosis ??)             -continue SSI             -CBGs appear well-controlled on current regimen CBG (last 3)  Recent Labs    02/04/23 1110 02/04/23 2108 02/05/23 0602  GLUCAP 128* 102* 106*      11: Tobacco use/cough: cessation counseling             -continue nicotine patch             -continue Pulmicort nebs BID              -continue Tussionex BID             -continue Mucinex BID             -continue Robitussin prn   12: CAD: s/p CBAG; on crestor 10mg  and aspirin   13: Post-op atrial fibrillation with RVR; rate has been controlled             -continue Pacerone, Lopressor             -on aspirin 325 mg daily   14: GERD: continue Protonix   15: ABLA: H and H improving, last HGB 9.1 6/11; follow-up CBC   16: Hyponatremia, mild 134 on 6/11 : follow-up BMP   17.  Morbid obesity  LOS: 1 days A FACE TO FACE EVALUATION WAS PERFORMED  Erick Colace 02/05/2023, 9:35 AM

## 2023-02-06 DIAGNOSIS — I25708 Atherosclerosis of coronary artery bypass graft(s), unspecified, with other forms of angina pectoris: Secondary | ICD-10-CM | POA: Diagnosis not present

## 2023-02-06 DIAGNOSIS — R5381 Other malaise: Secondary | ICD-10-CM | POA: Diagnosis not present

## 2023-02-06 LAB — GLUCOSE, CAPILLARY
Glucose-Capillary: 93 mg/dL (ref 70–99)
Glucose-Capillary: 113 mg/dL — ABNORMAL HIGH (ref 70–99)
Glucose-Capillary: 116 mg/dL — ABNORMAL HIGH (ref 70–99)
Glucose-Capillary: 111 mg/dL — ABNORMAL HIGH (ref 70–99)

## 2023-02-06 MED ORDER — POLYETHYLENE GLYCOL 3350 17 G PO PACK: 17.0000 g | PACK | Freq: Every day | ORAL | Status: DC

## 2023-02-06 NOTE — Progress Notes (Addendum)
Physical Therapy Session Note  Patient Details  Name: Bryan Brock MRN: [id number] Date of Birth: 1956-04-17  Today's Date: 02/06/2023 PT Individual Time: 6962-9528 and 4132-4401 PT Individual Time Calculation (min): 30 min and 81 min  Short Term Goals: Week 1:  PT Short Term Goal 1 (Week 1): STG = LTG due to ELOS  Skilled Therapeutic Interventions/Progress Updates: Tx1: Pt presented in recliner completing breathing treatment and agreeable to therapy. Pt denies pain at rest during session. Session focused on functional mobility for cardiovascular endurance. PTA donned TED hose on RLE total A and donned ace bandage on LLE due to L foot pain with pressure. Pt able to thread pants with set up, performed Sit to stand with CGA and was able to pull pants over hips with CGA. Pt then ambulated to day room with close supervision and w/c follow. Vitals checked after ambulation SpO2 94% HR 95 mild dyspnea noted. After seated rest pt returned ambulation back to room in same manner as prior. Pt returned to recliner at end of session and left with belt alarm on, call bell within reach and needs met.   Tx2: Pt presented in recliner agreeable to therapy. MD and RN present (cardiothoracic MD checking in and RN draining lines). Pt also receiving pain meds prior to RN leaving room. Pt then requesting to use bathroom prior to leaving room. Stood with CGA (hands on BLE) and ambulated with RW and CGA to toilet. Pt was able to maintain standing balance and continent urinary void. Pt then ambulated to sink with RW to complete hand hygiene. Pt then donned gown with set up while remaining in standing. Pt then ambulated to day room with close supervision and PTA providing assist for wound vac management. In day room pt worked on Editor, commissioning activities by participating in several rounds of corn hole. First x 2 rounds pt performed without AD on level tile. Additional x 2 rounds performed with single LE on 4in step for  increased balance challenge. Pt able to complete without LOB however c/o increased R knee pain. PTA providing education during rest breaks regarding energy conservation with pt indicating at most 6/10 on mBORG. Pt also completed seated periscapular exercises with education regarding benefits (improved posture, decreased adhesions from scar tissue). Pt performed shoulder circles (forward/backwards), shrugs, scap pinches, and dorsal extension x 10 each. Pt then ambulated back to room however required x 3 standing rest breaks due to fatigue and RLE pain. In room pt requesting to return to bed. Pt sat EOB with CGA but required minA for sit to supine in order to maintain sternal precautions. Pt able to reposition in bed to comfort with supervision and use of bed features. Pt left in bed at end of session with bed alarm on, call bell within reach and needs met.   Access Code: [email or web address] URL: [email or web address] Date: 02/06/2023 Prepared by: Cornell Barman Amyriah Buras  Exercises - Seated Active Back Extension  - 1 x daily - 7 x weekly - 1 sets - 10 reps - Seated Shoulder Shrug Circles AROM Backward  - 1 x daily - 7 x weekly - 1 sets - 10 reps - Seated Shoulder Shrug Circles AROM Forward  - 1 x daily - 7 x weekly - 1 sets - 10 reps - Seated Shoulder Shrugs  - 1 x daily - 7 x weekly - 1 sets - 10 reps - Seated Scapular Retraction  - 1 x daily - 7 x weekly - 1 sets - 10 reps  Therapy Documentation Precautions:  Precautions Precautions: Sternal, Fall Precaution Comments: Watch SpO2, sternal wound vac Restrictions Weight Bearing Restrictions: Yes (Sternum Precautions) LUE Weight Bearing: Touch down weight bearing Other Position/Activity Restrictions: sternal precs; pt has chest binder in place around sternal incision. General:   Vital Signs:   Pain:   Mobility:   Locomotion :    Trunk/Postural Assessment :    Balance:   Exercises:   Other Treatments:      Therapy/Group:  Individual Therapy  Mel Langan 02/06/2023, 12:14 PM

## 2023-02-06 NOTE — Progress Notes (Signed)
Occupational Therapy Session Note  Patient Details  Name: Bryan Brock MRN: [id number] Date of Birth: 05/12/1956  Today's Date: 02/06/2023 OT Individual Time: 1015-1108 OT Individual Time Calculation (min): 53 min    Short Term Goals: Week 1:  OT Short Term Goal 1 (Week 1): STGs = LTGs  Skilled Therapeutic Interventions/Progress Updates:    Pt received in hand off from PT.  Pt returned to room to engage in bathing tasks. Worked in standing to brush teeth, wash UB, adjust binder with 1 short rest break. He consistently stood and sat with close S using his legs only. Able to wash his buttocks using mini squat and hip hike with arm next to trunk to maintain sternal precautions and then donned shorts without A.   Practiced using a toilet reacher with a wash cloth in simulated manner out side his clothing for practice with toileting. Pt felt this technique worked well.  Transferred to recliner and resting with all needs met. Alarm on.   Therapy Documentation Precautions:  Precautions Precautions: Sternal, Fall Precaution Comments: Watch SpO2, sternal wound vac Restrictions Weight Bearing Restrictions: Yes (Sternum Precautions) LUE Weight Bearing: Touch down weight bearing Other Position/Activity Restrictions: sternal precs; pt has chest binder in place around sternal incision.    Vital Signs: Therapy Vitals Temp: 97.7 F (36.5 C) Temp Source: Oral Pulse Rate: 85 Resp: 20 BP: 135/75 Patient Position (if appropriate): Lying Oxygen Therapy SpO2: 97 % O2 Device: Room Air Pain: Pain Assessment Pain Scale: 0-10 Pain Score: 6  Pain Type: Surgical pain Pain Location: Sternum Pain Orientation: Medial Pain Descriptors / Indicators: Discomfort;Aching Pain Frequency: Intermittent Pain Onset: On-going Patients Stated Pain Goal: 2 Pain Intervention(s): Medication (See eMAR) Multiple Pain Sites: No    Therapy/Group: Individual Therapy  Gayle Collard 02/06/2023, 8:29 AM

## 2023-02-06 NOTE — Progress Notes (Signed)
Physical Therapy Session Note  Patient Details  Name: Bryan Brock MRN: [id number] Date of Birth: May 14, 1956  Today's Date: 02/06/2023 PT Individual Time: 4098-1191 PT Individual Time Calculation (min): 57 min   Short Term Goals: Week 1:  PT Short Term Goal 1 (Week 1): STG = LTG due to ELOS  Skilled Therapeutic Interventions/Progress Updates: Patient in recliner on entrance to room. Patient alert and agreeable to PT session.   Patient reported   Therapeutic Activity: Transfers: Pt performed sit<>stand transfers throughout session with supervision for safety. Patient noted to not require VC this session as pt was able to recall sternal precautions by only using UE's to stabilize, and to anteriorly scoot with forward lean. Patient performed to RW or no AD with same assistance level. Patient required mod cues to ensure RW is in safe proximity prior to sitting or standing as to adhere to sternal precautions (not going into high flexion in B UE).  - PTA modified pt heart pillow with soft straps so that it hangs around neck as to increase pt accessibility to use for stent if required while ambulating.  Gait Training:  Pt ambulated 260' around main gym and wallway using RW with WC follow and supervision for safety. Patient required 3 standing rest breaks to catch breath, and was cued to perform pursed lip breathing. Patient min/mod cues to stand upright per forward flexed posture as to avoid WB on B UE. 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  Therapeutic Exercise: Pt performed the following exercises with therapist providing the described cuing and facilitation for improvement. - 2 x 10 sit to stands with patient requiring a couple of seconds throughout each set to rest, and to practice pursed lip breathing. Patient's SpO2 checked at 97% throughout. Cues to practice energy  conservation.  Patient handed off to OT in main gym.      Therapy Documentation Precautions:  Precautions Precautions: Sternal, Fall Precaution Comments: Watch SpO2, sternal wound vac Restrictions Weight Bearing Restrictions: Yes (Sternum Precautions) LUE Weight Bearing: Touch down weight bearing Other Position/Activity Restrictions: sternal precs; pt has chest binder in place around sternal incision.  Therapy/Group: Individual Therapy  Rukiya Hodgkins PTA 02/06/2023, 12:49 PM

## 2023-02-06 NOTE — Progress Notes (Signed)
PROGRESS NOTE   Subjective/Complaints:  No pain c/os, pt well aware of sternal precautions  Discussed probable discharge next week will need HH therapy so wound care can be addressed  Bryan Brock' works during the day   ROS- neg CP, SOB, N/V/D  Objective:   DG CHEST PORT 1 VIEW  Result Date: 02/04/2023 CLINICAL DATA:  Shortness of breath EXAM: PORTABLE CHEST 1 VIEW COMPARISON:  Previous studies including the examination of 02/01/2023 FINDINGS: Transverse diameter of heart is increased. Metallic sutures are seen in sternum suggesting previous cardiac surgery. Increased interstitial markings are seen in left parahilar region and left lower lung field. There is blunting of left lateral CP angle suggesting small effusion. Right lateral CP angle is unremarkable. There is no pneumothorax. IMPRESSION: Cardiomegaly. There are no signs of alveolar pulmonary edema. Increased interstitial markings are seen in left parahilar region and left lower lung fields suggesting asymmetric interstitial edema or interstitial pneumonia. Small left pleural effusion. Electronically Signed   By: Ernie Avena M.D.   On: 02/04/2023 15:31   Recent Labs    02/04/23 0131 02/05/23 0611  WBC 4.6 5.2  HGB 9.1* 9.7*  HCT 29.9* 32.6*  PLT 341 381    Recent Labs    02/04/23 0131 02/05/23 0611  NA 134* 136  K 3.8 4.0  CL 102 102  CO2 26 25  GLUCOSE 115* 114*  BUN 20 16  CREATININE 1.15 1.20  CALCIUM 8.0* 8.4*     Intake/Output Summary (Last 24 hours) at 02/06/2023 1610 Last data filed at 02/06/2023 9604 Gross per 24 hour  Intake 712 ml  Output 1655 ml  Net -943 ml         Physical Exam: Vital Signs Blood pressure 135/75, pulse 85, temperature 97.7 F (36.5 C), temperature source Oral, resp. rate 20, height 5\' 11"  (1.803 m), weight (!) 141.7 kg, SpO2 97 %. General: No acute distress Mood and affect are appropriate Heart: Chest with 2 JP  drains serosanguinous output  Regular rate and rhythm no rubs murmurs or extra sounds Lungs: Clear to auscultation, breathing unlabored, no rales or wheezes Abdomen: Positive bowel sounds, soft nontender to palpation, nondistended Extremities: No clubbing, cyanosis, or edema Skin: No evidence of breakdown, no evidence of rash, chest wound with wound vac Neurologic: Cranial nerves II through XII intact, motor strength is 4/5 in bilateral delt  bicep, tricep( sternal prec) , 5/5 grip,4/5 hip flexor, knee extensors, ankle dorsiflexor and plantar flexor Sensory exam normal sensation to light touch and proprioception in bilateral upper and lower extremities  Musculoskeletal:  No joint swelling   Assessment/Plan: 1. Functional deficits which require 3+ hours per day of interdisciplinary therapy in a comprehensive inpatient rehab setting. Physiatrist is providing close team supervision and 24 hour management of active medical problems listed below. Physiatrist and rehab team continue to assess barriers to discharge/monitor patient progress toward functional and medical goals  Care Tool:  Bathing    Body parts bathed by patient: Right arm, Left arm, Front perineal area, Right upper leg, Left upper leg, Face   Body parts bathed by helper: Chest, Abdomen Body parts n/a: Right lower leg, Left lower leg, Buttocks  Bathing assist Assist Level: Minimal Assistance - Patient > 75%     Upper Body Dressing/Undressing Upper body dressing   What is the patient wearing?: Pull over shirt    Upper body assist Assist Level: Supervision/Verbal cueing    Lower Body Dressing/Undressing Lower body dressing      What is the patient wearing?: Pants     Lower body assist Assist for lower body dressing: Minimal Assistance - Patient > 75%     Toileting Toileting    Toileting assist Assist for toileting: Moderate Assistance - Patient 50 - 74%     Transfers Chair/bed transfer  Transfers assist      Chair/bed transfer assist level: Minimal Assistance - Patient > 75%     Locomotion Ambulation   Ambulation assist      Assist level: Contact Guard/Touching assist Assistive device: Walker-rolling Max distance: 95'   Walk 10 feet activity   Assist     Assist level: Contact Guard/Touching assist Assistive device: Walker-rolling   Walk 50 feet activity   Assist    Assist level: Contact Guard/Touching assist Assistive device: Walker-rolling    Walk 150 feet activity   Assist Walk 150 feet activity did not occur: Safety/medical concerns         Walk 10 feet on uneven surface  activity   Assist Walk 10 feet on uneven surfaces activity did not occur: Safety/medical concerns         Wheelchair     Assist Is the patient using a wheelchair?: Yes Type of Wheelchair: Manual    Wheelchair assist level: Dependent - Patient 0% Max wheelchair distance: 150    Wheelchair 50 feet with 2 turns activity    Assist    Wheelchair 50 feet with 2 turns activity did not occur:  (sternal precautions)   Assist Level: Dependent - Patient 0% (sternal precautions)   Wheelchair 150 feet activity     Assist  Wheelchair 150 feet activity did not occur:  (sternal precautions)   Assist Level: Dependent - Patient 0% (sternal precautions)   Blood pressure 135/75, pulse 85, temperature 97.7 F (36.5 C), temperature source Oral, resp. rate 20, height 5\' 11"  (1.803 m), weight (!) 141.7 kg, SpO2 97 %.    Medical Problem List and Plan: 1. 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  Antithrombotics: -DVT/anticoagulation:  Pharmaceutical: Lovenox             -antiplatelet therapy: Aspirin 325 mg daily   3. Pain  Management: Tylenol, oxycodone, tramadol as needed             -gabapentin 200 mg BID   4. Mood/Behavior/Sleep: LCSW to evaluate and provide emotional support; history of bipolar 1 disorder             -antipsychotic agents: n/a             -continue Lamictal 100 mg daily              5. Neuropsych/cognition: This patient is capable of making decisions on his own behalf.   6. Skin/Wound Care: Routine  skin care checks             -continue wound VAC and drain>>follow-up with plastic surgery regarding management   7. Fluids/Electrolytes/Nutrition: Routine Is and Os and follow-up chemistries             -continue Juven, zinc   8: Hypertension: monitor TID and prn.  Monitor with mobility             -continue Lopressor 37.5 mg BID             -continue Lasix 40 mg daily             -continue Pacerone 200 mg daily Vitals:   02/06/23 0550 02/06/23 0745  BP: (!) 146/86 135/75  Pulse: 76 85  Resp: 20   Temp: 97.7 F (36.5 C)   SpO2: 97%       9: Hyperlipidemia: continue Crestor 10 mg daily   10: DM-2: CBGs QID, carb modified diet, A1c = 6.6% (new diagnosis ??)             -continue SSI             -CBGs appear well-controlled on current regimen CBG (last 3)  Recent Labs    02/05/23 2114 02/05/23 2347 02/06/23 0554  GLUCAP 124* 119* 93       11: Tobacco use/cough: cessation counseling             -continue nicotine patch             -continue Pulmicort nebs BID             -continue Tussionex BID             -continue Mucinex BID             -continue Robitussin prn   12: CAD: s/p CBAG; on crestor 10mg  and aspirin   13: Post-op atrial fibrillation with RVR; rate has been controlled             -continue Pacerone, Lopressor             -on aspirin 325 mg daily   14: GERD: continue Protonix   15: ABLA: H and H improving, last HGB 9.1 6/11; follow-up CBC   16: Hyponatremia, mild 134 on 6/11 : follow-up BMP   17.  Morbid obesity  LOS: 2 days A FACE TO FACE  EVALUATION WAS PERFORMED  Bryan Brock 02/06/2023, 8:33 AM

## 2023-02-06 NOTE — IPOC Note (Signed)
Overall Plan of Care [facility]) Patient Details Name: Bryan Brock MRN: [id number] DOB: 07/29/1956  Admitting Diagnosis:Coronary artery disease  Hospital Problems: Active Problems:   Debility     Functional Problem List: Nursing Bowel, Safety, Endurance, Medication Management, Skin Integrity, Pain  PT Balance, Pain, Perception, Safety, Endurance  OT Balance, Endurance, Edema, Pain  SLP    TR         Basic ADL's: OT Bathing, Dressing, Toileting, Grooming     Advanced  ADL's: OT Simple Meal Preparation     Transfers: PT Bed Mobility, Bed to Chair, Car  OT Toilet     Locomotion: PT Ambulation, Stairs     Additional Impairments: OT None  SLP        TR      Anticipated Outcomes Item Anticipated Outcome  Self Feeding independent  Swallowing      Basic self-care  Modified independent  Toileting  Modified independent   Bathroom Transfers Modified Independent  Bowel/Bladder  manage bowel w mod I  Transfers  modI with RW  Locomotion  modI with RW short distances  Communication     Cognition     Pain  < 4 with prns  Safety/Judgment  manage w cues   Therapy Plan: PT Intensity: Minimum of 1-2 x/day ,45 to 90 minutes PT Frequency: 5 out of 7 days PT Duration Estimated Length of Stay: 7-9 days OT Intensity: Minimum of 1-2 x/day, 45 to 90 minutes OT Frequency: 5 out of 7 days OT Duration/Estimated Length of Stay: 7-9 days     Team Interventions: Nursing Interventions Patient/Family Education, Pain Management, Medication Management, Discharge Planning, Bowel Management, Skin Care/Wound Management, Disease Management/Prevention  PT interventions Ambulation/gait training, Discharge planning, DME/adaptive equipment instruction, Functional mobility training, Pain management, Psychosocial support, Therapeutic Activities, UE/LE Strength taining/ROM, UE/LE Coordination activities, Stair training, Therapeutic Exercise, Patient/family education, Neuromuscular  re-education, Disease management/prevention, Warden/ranger, Community reintegration  OT Interventions Warden/ranger, Discharge planning, DME/adaptive equipment instruction, Functional mobility training, Pain management, Patient/family education, Psychosocial support, Self Care/advanced ADL retraining, UE/LE Strength taining/ROM, Therapeutic Exercise, Therapeutic Activities, UE/LE Coordination activities  SLP Interventions    TR Interventions    SW/CM Interventions Discharge Planning, Psychosocial Support, Patient/Family Education, Disease Management/Prevention   Barriers to Discharge MD  Wound care and Weight  Nursing Lack of/limited family support, Home environment access/layout 2 level, main B+B, 5 ste no rails solo; S.O. works days and will assist prn  PT Decreased caregiver support, Home environment access/layout 5 STE, wife works during the day and will have intermittent assist morning/night  OT      SLP      SW Decreased caregiver support, Inaccessible home environment, Lack of/limited family support, Home environment Best boy, Wound Care     Team Discharge Planning: Destination: PT-Home ,OT- Home , SLP-  Projected Follow-up: PT-Home health PT, OT-  Home health OT, SLP-  Projected Equipment Needs: PT-Rolling walker with 5" wheels, OT- None recommended by OT, SLP-  Equipment Details: PT-pt has been given bari RW but would also like standard RW, OT-wife has already purchased a shower chair for when he needs it in the future Patient/family involved in discharge planning: PT- Patient,  OT-Patient, SLP-   MD ELOS: 5-7d Medical Rehab Prognosis:  Good Assessment: The patient has been admitted for CIR therapies with the diagnosis of Coronary artery disease . The team will be addressing functional mobility, strength, stamina, balance, safety, adaptive techniques and equipment, self-care, bowel and bladder mgt, patient and caregiver education, Wound  care.  Goals have been set at Mod I. Anticipated discharge destination is Home .        See Team Conference Notes for weekly updates to the plan of care

## 2023-02-07 DIAGNOSIS — R5381 Other malaise: Secondary | ICD-10-CM | POA: Diagnosis not present

## 2023-02-07 DIAGNOSIS — I25708 Atherosclerosis of coronary artery bypass graft(s), unspecified, with other forms of angina pectoris: Secondary | ICD-10-CM | POA: Diagnosis not present

## 2023-02-07 LAB — GLUCOSE, CAPILLARY
Glucose-Capillary: 100 mg/dL — ABNORMAL HIGH (ref 70–99)
Glucose-Capillary: 136 mg/dL — ABNORMAL HIGH (ref 70–99)
Glucose-Capillary: 125 mg/dL — ABNORMAL HIGH (ref 70–99)

## 2023-02-07 MED ORDER — POLYETHYLENE GLYCOL 3350 17 G PO PACK
17.0000 g | PACK | Freq: Two times a day (BID) | ORAL | Status: DC
  Administered 2023-02-07 – 2023-02-11 (×8): 17 g via ORAL
  Filled 2023-02-07 (×8): qty 1

## 2023-02-07 MED ORDER — OXYCODONE HCL 5 MG PO TABS
10.0000 mg | ORAL_TABLET | ORAL | Status: DC | PRN
  Administered 2023-02-07 – 2023-02-09 (×8): 10 mg via ORAL
  Filled 2023-02-07 (×10): qty 2

## 2023-02-07 NOTE — Progress Notes (Signed)
Physical Therapy Session Note  Patient Details  Name: Bryan Brock MRN: [id number] Date of Birth: 14-Mar-1956  Today's Date: 02/07/2023 PT Individual Time: 1410-1510 PT Individual Time Calculation (min): 60 min   Short Term Goals: Week 1:  PT Short Term Goal 1 (Week 1): STG = LTG due to ELOS  SESSION 1 Skilled Therapeutic Interventions/Progress Updates: Patient in recliner on entrance to room. Patient alert and agreeable to PT session. Pt dependently transported to main gym for energy conservation.  Therapeutic Activity: Transfers: Pt performed sit<>stand transfers throughout session with RW and supervision for safety. No VC required.   - PTA readjusted patient heart pillow at beginning of session so that it remained around neck; however, further adjustment required in future session.  Gait Training:  Pt ambulated 200'+ using RW with WC follow for safety and PTA managing wound vac. Patient with forward flexed posture and was provided cues to ambulate upright and look forward ahead vs downward gaze presentation (mod cues). Patient required 3 standing rest breaks with pursed lip breathing cue. Patient instructed to lean to R when turning to L per previous presentation of excessive L lean when turning to the L. Patient also cued to decrease conversations while walking as to conserve energy and increase lung capacity.  Patient in recliner at end of session with brakes locked, chair alarm set, and all needs within reach.  SESSION 2 Skilled Therapeutic Interventions/Progress Updates: Patient in recliner on entrance to room. Patient alert and agreeable to PT session.   Today's session focused on building patient's B LE strength and cardiovascular endurance. Patient's R WC wheel was not bolted in tight and had a wobble to it. PTA adjusted bolt with tools for patient safety.   Therapeutic Activity: Bed Mobility: Pt performed sit to supine from EOB with supervision for safety. No VC required.  Patient rolled to R and L with supervision for PTA to untie hospital gown, and scooted towards [name] with bed flat and heavy modA (PTA use of chuck pad and patient use of B LE's). Transfers: Pt performed sit<>stands throughout session to RW with supervision for safety. No VC required.   Gait Training:  Pt ambulated 300'+ with 4lb ankle weight on B LE's. Patient used RW and required supervision for safety. PTA managed WC follow for rest breaks and managed wound vac. Pt required mod cues to ambulate with forward gaze and upright posture per forward flexed favoritism and downward gaze tendency. Patient required x 3 standing rest breaks and then a seated rest break 2/2 reports of increased R knee pain from "bone on bone" per patient. Patient transported back to main gym for PTA to finish applying straps around heart pillow so that patient can wear it around neck, having easy access to it while ambulating. Patient SpO2 recorded as 93%. Patient instructed to perform pursed lip breathing. O2 saturation increased to 98% shortly after pursed lip breathing. Patient cued to decrease conversations throughout ambulation for energy conservation.   Patient supine in bed at end of session with brakes locked, bed alarm set, and all needs within reach.       Therapy Documentation Precautions:  Precautions Precautions: Sternal, Fall Precaution Comments: Watch SpO2, sternal wound vac Restrictions Weight Bearing Restrictions: Yes (Sternum Precautions) RUE Weight Bearing:  (sternum precautions) LUE Weight Bearing: Touch down weight bearing Other Position/Activity Restrictions: sternal precs; pt has chest binder in place around sternal incision.  Vitals: SpO2 93% after ambulating 300'+  Therapy/Group: Individual Therapy  Yahye Siebert PTA 02/07/2023,  3:53 PM

## 2023-02-07 NOTE — Progress Notes (Addendum)
Patient ID: Bryan Brock, male   DOB: 09/10/1955, 67 y.o.   MRN: [id number]  [facility] referral sent to [facility].  Patient approved.  12:37: Orders sent.

## 2023-02-07 NOTE — Progress Notes (Signed)
Patient ID: Bryan Brock, male   DOB: July 09, 1956, 67 y.o.   MRN: [id number] Met with the patient to review current situation, rehab process, team conference and plan of care. Discussed wound vac(prevena) for home and JP drain care with log of output. Given information on  HH/CMM diet, medications and dietary modification recommendations.  Continue to follow along to address educational needs to facilitate preparation for discharge. Pamelia Hoit

## 2023-02-07 NOTE — Plan of Care (Signed)
  Problem: Consults Goal: RH GENERAL PATIENT EDUCATION Description: See Patient Education module for education specifics. Outcome: Progressing   Problem: RH BOWEL ELIMINATION Goal: RH STG MANAGE BOWEL WITH ASSISTANCE Description: STG Manage Bowel with toileting Assistance. Outcome: Progressing Goal: RH STG MANAGE BOWEL W/MEDICATION W/ASSISTANCE Description: STG Manage Bowel with Medication with mod I Assistance. Outcome: Progressing   Problem: RH SKIN INTEGRITY Goal: RH STG SKIN FREE OF INFECTION/BREAKDOWN Description: Manage with min assist Outcome: Progressing Goal: RH STG ABLE TO PERFORM INCISION/WOUND CARE W/ASSISTANCE Description: STG Able To Perform Incision/Wound Care With min Assistance. Outcome: Progressing   Problem: RH SAFETY Goal: RH STG ADHERE TO SAFETY PRECAUTIONS W/ASSISTANCE/DEVICE Description: STG Adhere to Safety Precautions With cues Assistance/Device. Outcome: Progressing   Problem: RH PAIN MANAGEMENT Goal: RH STG PAIN MANAGED AT OR BELOW PT'S PAIN GOAL Description: < 4 with prns Outcome: Progressing   Problem: RH KNOWLEDGE DEFICIT GENERAL Goal: RH STG INCREASE KNOWLEDGE OF SELF CARE AFTER HOSPITALIZATION Description: Patient and S.O. will be able to manage care at discharge , medications ,,dietary modification using educational resources independently Outcome: Progressing   Problem: Education: Goal: Will demonstrate proper wound care and an understanding of methods to prevent future damage Outcome: Progressing Goal: Knowledge of disease or condition will improve Outcome: Progressing Goal: Knowledge of the prescribed therapeutic regimen will improve Outcome: Progressing Goal: Individualized Educational Video(s) Outcome: Progressing   Problem: Activity: Goal: Risk for activity intolerance will decrease Outcome: Progressing   Problem: Cardiac: Goal: Will achieve and/or maintain hemodynamic stability Outcome: Progressing   Problem: Clinical  Measurements: Goal: Postoperative complications will be avoided or minimized Outcome: Progressing   Problem: Respiratory: Goal: Respiratory status will improve Outcome: Progressing   Problem: Skin Integrity: Goal: Wound healing without signs and symptoms of infection Outcome: Progressing Goal: Risk for impaired skin integrity will decrease Outcome: Progressing   Problem: Urinary Elimination: Goal: Ability to achieve and maintain adequate renal perfusion and functioning will improve Outcome: Progressing   Problem: Activity: Goal: Ability to tolerate increased activity will improve Outcome: Progressing   Problem: Respiratory: Goal: Ability to maintain a clear airway and adequate ventilation will improve Outcome: Progressing   Problem: Role Relationship: Goal: Method of communication will improve Outcome: Progressing

## 2023-02-07 NOTE — Progress Notes (Signed)
PROGRESS NOTE   Subjective/Complaints:  No pain c/os, pt well aware of sternal precautions  Discussed probable discharge next week will need HH therapy so wound care can be addressed  [name]' works during the day   ROS- neg CP, SOB, N/V/D  Objective:   No results found. Recent Labs    02/05/23 0611  WBC 5.2  HGB 9.7*  HCT 32.6*  PLT 381    Recent Labs    02/05/23 0611  NA 136  K 4.0  CL 102  CO2 25  GLUCOSE 114*  BUN 16  CREATININE 1.20  CALCIUM 8.4*     Intake/Output Summary (Last 24 hours) at 02/07/2023 0841 Last data filed at 02/07/2023 1610 Gross per 24 hour  Intake 578 ml  Output 1805 ml  Net -1227 ml         Physical Exam: Vital Signs Blood pressure (!) 141/89, pulse 62, temperature 98.1 F (36.7 C), temperature source Oral, resp. rate 20, height 5\' 11"  (1.803 m), weight (!) 138.9 kg, SpO2 100 %. General: No acute distress Mood and affect are appropriate Heart: Chest with 2 JP drains serosanguinous output  Regular rate and rhythm no rubs murmurs or extra sounds Lungs: Clear to auscultation, breathing unlabored, no rales or wheezes Abdomen: Positive bowel sounds, soft nontender to palpation, nondistended Extremities: No clubbing, cyanosis, or edema Skin: No evidence of breakdown, no evidence of rash, chest wound with wound vac Neurologic: Cranial nerves II through XII intact, motor strength is 4/5 in bilateral delt  bicep, tricep( sternal prec) , 5/5 grip,4/5 hip flexor, knee extensors, ankle dorsiflexor and plantar flexor Sensory exam normal sensation to light touch and proprioception in bilateral upper and lower extremities  Musculoskeletal:  No joint swelling   Assessment/Plan: 1. Functional deficits which require 3+ hours per day of interdisciplinary therapy in a comprehensive inpatient rehab setting. Physiatrist is providing close team supervision and 24 hour management of active  medical problems listed below. Physiatrist and rehab team continue to assess barriers to discharge/monitor patient progress toward functional and medical goals  Care Tool:  Bathing    Body parts bathed by patient: Right arm, Left arm, Front perineal area, Right upper leg, Left upper leg, Face, Buttocks   Body parts bathed by helper: Chest, Abdomen Body parts n/a: Right lower leg, Left lower leg   Bathing assist Assist Level: Minimal Assistance - Patient > 75%     Upper Body Dressing/Undressing Upper body dressing   What is the patient wearing?: Pull over shirt    Upper body assist Assist Level: Supervision/Verbal cueing    Lower Body Dressing/Undressing Lower body dressing      What is the patient wearing?: Pants     Lower body assist Assist for lower body dressing: Supervision/Verbal cueing     Toileting Toileting    Toileting assist Assist for toileting: Moderate Assistance - Patient 50 - 74%     Transfers Chair/bed transfer  Transfers assist     Chair/bed transfer assist level: Minimal Assistance - Patient > 75%     Locomotion Ambulation   Ambulation assist      Assist level: Contact Guard/Touching assist Assistive device: Walker-rolling Max distance:  95'   Walk 10 feet activity   Assist     Assist level: Contact Guard/Touching assist Assistive device: Walker-rolling   Walk 50 feet activity   Assist    Assist level: Contact Guard/Touching assist Assistive device: Walker-rolling    Walk 150 feet activity   Assist Walk 150 feet activity did not occur: Safety/medical concerns         Walk 10 feet on uneven surface  activity   Assist Walk 10 feet on uneven surfaces activity did not occur: Safety/medical concerns         Wheelchair     Assist Is the patient using a wheelchair?: Yes Type of Wheelchair: Manual    Wheelchair assist level: Dependent - Patient 0% Max wheelchair distance: 150    Wheelchair 50 feet  with 2 turns activity    Assist    Wheelchair 50 feet with 2 turns activity did not occur:  (sternal precautions)   Assist Level: Dependent - Patient 0% (sternal precautions)   Wheelchair 150 feet activity     Assist  Wheelchair 150 feet activity did not occur:  (sternal precautions)   Assist Level: Dependent - Patient 0% (sternal precautions)   Blood pressure (!) 141/89, pulse 62, temperature 98.1 F (36.7 C), temperature source Oral, resp. rate 20, height 5\' 11"  (1.803 m), weight (!) 138.9 kg, SpO2 100 %.    Medical Problem List and Plan: 1. 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  Antithrombotics: -DVT/anticoagulation:  Pharmaceutical: Lovenox             -antiplatelet therapy: Aspirin 325 mg daily   3. Pain Management: Tylenol, oxycodone, tramadol as needed             -gabapentin 200 mg BID Starting to wean Oxy IR, change from Q3h to Q4 h, pt not ready for q6h yet Anticipate home with 1 wk supply of pain meds with plastics f/u    4. Mood/Behavior/Sleep: LCSW to evaluate and provide emotional support; history of bipolar 1 disorder             -antipsychotic agents: n/a             -continue Lamictal 100 mg daily              5. Neuropsych/cognition: This patient is capable of making decisions on his own behalf.   6. Skin/Wound Care: Routine skin care checks             -continue wound VAC and drain>>follow-up with plastic surgery regarding management   7. Fluids/Electrolytes/Nutrition: Routine Is and Os and follow-up chemistries             -continue Juven, zinc   8: Hypertension: monitor TID and prn.  Monitor with mobility             -continue Lopressor 37.5 mg BID             -continue Lasix 40 mg daily              -continue Pacerone 200 mg daily Vitals:  02/06/23 2100 02/07/23 0534  BP: 139/75 (!) 141/89  Pulse: 81 62  Resp:  20  Temp:  98.1 F (36.7 C)  SpO2:  100%      9: Hyperlipidemia: continue Crestor 10 mg daily   10: DM-2: CBGs QID, carb modified diet, A1c = 6.6% (new diagnosis ??)             -continue SSI             -CBGs appear well-controlled on current regimen CBG (last 3)  Recent Labs    02/06/23 1707 02/06/23 2136 02/07/23 0634  GLUCAP 116* 111* 100*       11: Tobacco use/cough: cessation counseling             -continue nicotine patch             -continue Pulmicort nebs BID             -continue Tussionex BID             -continue Mucinex BID             -continue Robitussin prn   12: CAD: s/p CBAG; on crestor 10mg  and aspirin   13: Post-op atrial fibrillation with RVR; rate has been controlled             -continue Pacerone, Lopressor             -on aspirin 325 mg daily   14: GERD: continue Protonix   15: ABLA: H and H improving, last HGB 9.1 6/11; follow-up CBC   16: Hyponatremia, mild 134 on 6/11 : follow-up BMP   17.  Morbid obesity   18.  Constipation , no BM x 2-3 d enc further weaning of pain meds, change miralax to BID  LOS: 3 days A FACE TO FACE EVALUATION WAS PERFORMED  Erick Colace 02/07/2023, 8:41 AM

## 2023-02-07 NOTE — Progress Notes (Addendum)
Occupational Therapy Session Note  Patient Details  Name: Bryan Brock MRN: [id number] Date of Birth: Sep 01, 1955  Today's Date: 02/07/2023 OT Individual Time: 4098-1191 OT Individual Time Calculation (min): 40 min    Short Term Goals: Week 1:  OT Short Term Goal 1 (Week 1): STGs = LTGs   Skilled Therapeutic Interventions/Progress Updates:    1:1 Pt received in the w/c. Focus on toileting with tongs (simulated) in standing while maintaining his sternal precautions (keeping his arm adducted close to his body. Pt remained standing in session for ~ 10 min before needed to sit down. Performed sit to stand in regular lower chair in room 10x with prolonged rest break afterwards. Pt reports fatigue and can feel lightheaded when overworked and then leans to the right. Pt ambulated into the bathroom with RW with close supervision and discussed bring the RW over top of the toilet when voiding to keep it close and for UE support if need. Pt able to perform toileting with supervision. Pt returned to room and to recliner and left resting in recliner before next session.   Therapy Documentation Precautions:  Precautions Precautions: Sternal, Fall Precaution Comments: Watch SpO2, sternal wound vac Restrictions Weight Bearing Restrictions: Yes (Sternum Precautions) RUE Weight Bearing:  (sternum precautions) LUE Weight Bearing: Touch down weight bearing Other Position/Activity Restrictions: sternal precs; pt has chest binder in place around sternal incision.  Pain: No c/o pain in session   Therapy/Group: Individual Therapy  [place], [facility] 02/07/2023, 4:03 PM

## 2023-02-07 NOTE — Progress Notes (Signed)
Occupational Therapy Session Note  Patient Details  Name: SAMEL BUTKOVICH MRN: [id number] Date of Birth: [birth year]  Today's Date: 02/07/2023 OT Individual Time: 1025-1105 OT Individual Time Calculation (min): 40 min    Short Term Goals: Week 1:  OT Short Term Goal 1 (Week 1): STGs = LTGs  Skilled Therapeutic Interventions/Progress Updates:    Pt received in recliner ready for therapy.  Pt eager to shave and brush his teeth. Suggested pt practice in standing to build his endurance as he will need to stand at home.  Pt stood from recliner and ambulated with RW to sink with Supervision. Pt hyperverbal and was getting out of breath.  Cued pt to not talk as he was completing standing tasks to conserve his energy. Pt continued to have difficulty not talking. But he was able to complete his self care with only 1 rest break. He ambulated back to his recliner and worked on standing before sitting with lateral weight shifts and pt able to hold his balance with no LOB.    His abdominal binder was rubbing his under arms.  Applied moleskin to have a softer surface. Also recommended he ask his wife to buy strips of silky fabric that can be placed under the rubbing spots.    Pt resting in recliner with all needs met.   Therapy Documentation Precautions:  Precautions Precautions: Sternal, Fall Precaution Comments: Watch SpO2, sternal wound vac Restrictions Weight Bearing Restrictions: Yes (Sternum Precautions) RUE Weight Bearing:  (sternum precautions) LUE Weight Bearing: Touch down weight bearing Other Position/Activity Restrictions: sternal precs; pt has chest binder in place around sternal incision.    Vital Signs: Therapy Vitals Pulse Rate: 85 Resp: 18 BP: (Abnormal) 140/77 Patient Position (if appropriate): Lying Oxygen Therapy SpO2: 99 % O2 Device: Room Air Pain: Pain Assessment Pain Scale: 0-10 Pain Score: 5  Pain Type: Surgical pain Pain Location: Sternum Pain Orientation:  Medial Pain Descriptors / Indicators: Aching Pain Frequency: Intermittent Pain Onset: Gradual Patients Stated Pain Goal: 1 Pain Intervention(s): Medication (See eMAR)    Therapy/Group: Individual Therapy  Aeden Matranga 02/07/2023, 9:54 AM

## 2023-02-07 NOTE — Progress Notes (Signed)
Subjective: 67 year old male status post right pectoralis major muscle advancement flap, placement of myriad and placement of incisional wound VAC with Dr. Ulice Bold on 01/31/2023.  Patient is doing well today, he reports he is likely going to be discharged next week.  He does not report any significant pain to his chest or any other concerns today.  He does report that the JP drains have been putting out a fair amount of fluid, reports that normally the right JP drain has had more drainage, but today he has noticed more drainage from the left side.  Daily total for today is 145 cc from the left and 160 cc from the right.  He is not having any infectious symptoms, his pain is well-controlled.  Objective: Vital signs in last 24 hours: Temp:  [97.9 F (36.6 C)-98.1 F (36.7 C)] 98.1 F (36.7 C) (06/14 0534) Pulse Rate:  [62-86] 85 (06/14 0919) Resp:  [16-20] 18 (06/14 0904) BP: (122-141)/(70-89) 140/77 (06/14 0919) SpO2:  [95 %-100 %] 99 % (06/14 0904) Weight:  [138.9 kg] 138.9 kg (06/14 0534) Last BM Date : 02/04/23  Intake/Output from previous day: 06/13 0701 - 06/14 0700 In: 480 [P.O.:480] Out: 1805 [Urine:1500; Drains:305] Intake/Output this shift: Total I/O In: 338 [P.O.:338] Out: -   General appearance: alert, cooperative, no distress, and sitting in bedside chair Head: Normocephalic, without obvious abnormality, atraumatic Resp: Unlabored Chest wall: Mild tenderness noted of chest, midline sternal incision is intact and healing well.  Prevena incisional wound VAC was removed and replaced.  Bilateral JP drains in place with serosanguineous drainage in bulbs.  There is no subcutaneous fluid collection noted palpation.  There is no erythema or cellulitic changes noted.   Lab Results:     Latest Ref Rng & Units 02/05/2023    6:11 AM 02/04/2023    1:31 AM 02/03/2023    2:02 AM  CBC  WBC 4.0 - 10.5 K/uL 5.2  4.6  4.4   Hemoglobin 13.0 - 17.0 g/dL 9.7  9.1  8.5   Hematocrit  39.0 - 52.0 % 32.6  29.9  28.5   Platelets 150 - 400 K/uL 381  341  313     BMET Recent Labs    02/05/23 0611  NA 136  K 4.0  CL 102  CO2 25  GLUCOSE 114*  BUN 16  CREATININE 1.20  CALCIUM 8.4*   PT/INR No results for input(s): "LABPROT", "INR" in the last 72 hours. ABG No results for input(s): "PHART", "HCO3" in the last 72 hours.  Invalid input(s): "PCO2", "PO2"  Studies/Results: No results found.  Anti-infectives: Anti-infectives (From admission, onward)    None       Assessment/Plan: s/p  67 year old male status postright pectoralis major muscle advancement flap and placement of myriad wound matrix powder and incisional wound VAC on 01/31/2023.  He is 1 week postop.  Patient is doing well, wound VAC was replaced and incision appears to be healing well.  There is no incisional dehiscence noted.  Will leave current incisional VAC in place for another week, if discharged early next week, can switch tubing from hospital VAC to [facility] machine.  Recommend continuing with compressive garment to chest Recommend optimizing nutritional status with protein and multivitamins  Recommend following up with our office next week after discharge 02/13/23 or 02/14/2023.  Pictures were obtained of the patient and placed in the chart with the patient's or guardian's permission.    LOS: 3 days    Leslee Home, PA-C  02/07/2023  

## 2023-02-08 DIAGNOSIS — R5381 Other malaise: Secondary | ICD-10-CM | POA: Diagnosis not present

## 2023-02-08 DIAGNOSIS — S21109A Unspecified open wound of unspecified front wall of thorax without penetration into thoracic cavity, initial encounter: Secondary | ICD-10-CM | POA: Diagnosis not present

## 2023-02-08 DIAGNOSIS — K5900 Constipation, unspecified: Secondary | ICD-10-CM

## 2023-02-08 DIAGNOSIS — I10 Essential (primary) hypertension: Secondary | ICD-10-CM | POA: Diagnosis not present

## 2023-02-08 DIAGNOSIS — D649 Anemia, unspecified: Secondary | ICD-10-CM

## 2023-02-08 DIAGNOSIS — E119 Type 2 diabetes mellitus without complications: Secondary | ICD-10-CM | POA: Diagnosis not present

## 2023-02-08 LAB — GLUCOSE, CAPILLARY: Glucose-Capillary: 103 mg/dL — ABNORMAL HIGH (ref 70–99)

## 2023-02-08 NOTE — Progress Notes (Signed)
Physical Therapy Session Note  Patient Details  Name: Bryan Brock MRN: [id number] Date of Birth: 25-May-1956  Today's Date: 02/08/2023 PT Individual Time: 4098-1191; 1305 - 1402 PT Individual Time Calculation (min): 58 min; 57 min   Short Term Goals: Week 1:  PT Short Term Goal 1 (Week 1): STG = LTG due to ELOS  SESSION 1 Skilled Therapeutic Interventions/Progress Updates: Patient in recliner on entrance to room. Patient alert and agreeable to PT session.   Patient reported pain on incision site (sternum) unrated. Nursing provided pain medication earlier.  Therapeutic Activity: Transfers: Pt performed sit<>stand transfers throughout session with supervision/modI. No VC required.   Gait Training:  Pt ambulated from room to main gym (200'+) with supervision using RW with supervision for safety and PTA managing WC and wound vac. VC for patient to stand upright in RW and to decrease talking as to avoid decrease lung capacity and energy waste.   Stair Navigation: - Ascending/descending 4 (6") steps. Patient used RHR with cues to face rail and step up laterally, leading with L LE (in order to avoid overstretching B UE's). Patient with light CGA at first for safety, then supervision. Patient faced same direction when descending (leading with R per ganglionic cyst on R foot). Patient performed 2 rounds and reported SOB and lightheadedness at the top of steps. Patient cued to pursed lip breath, and then when symptoms decreased, patient descending and rested. BP and SpO2 monitored in vitals and were WNL.  Therapeutic Exercise: - 15 sit to stands in [facility] with rest breaks broke up between reps as required. Patient cued to decrease conversations, and to inhale through nose, and exhale through mouth.   Patient in recliner at end of session with brakes locked, chair alarm set, and all needs within reach.  SESSION 2 Skilled Therapeutic Interventions/Progress Updates: Patient in recliner on entrance to  room. Patient alert and agreeable to PT session.   Patient reported no change in status since previous session  Therapeutic Activity: Bed Mobility: Pt performed sit to supine from EOB with supervision/modI for safety. Patient leaned to left side with bed flat in order to bring LE's over on top.  Transfers: Pt performed sit<>stand transfers throughout session with RW and modI. No VC required.   - Car transfer performed today with patient only requiring one cue to avoid pulling on top of bar per sternal precautions, otherwise patient is supervision and has decreased level of assistance from previous trial.   Gait Training:  Pt ambulated from day room gym to ortho gym, to main room, and back to room with rest breaks at each location required per pt report of increased R knee pain. Pt used RW with supervision/modI for safety. . Pt required VC to take more standing breaks to catch breath since patient has a natural tendency to talk while ambulating (to meet pt where he is at and how he always has been). Patient required multiple standing rest breaks throughout ambulating around inpatient floor.  Therapeutic Exercise: Pt performed the following exercises with therapist providing the described cuing and facilitation for improvement. - snake around cones to promote turns in RW. Patient has tendency to lean to the L per R knee pain, and not wanting to shift the weight to the R to maintain balance. Patient performed snake course with light CGA for safety, and was cued to perform task as quickly and safely as possible.  Patient supine in bed at end of session with brakes locked, bed alarm set,  and all needs within reach.       Therapy Documentation Precautions:  Precautions Precautions: Sternal, Fall Precaution Comments: Watch SpO2, sternal wound vac Restrictions Weight Bearing Restrictions: Yes (Sternum Precautions) RUE Weight Bearing:  (sternum precautions) LUE Weight Bearing: Touch down weight  bearing Other Position/Activity Restrictions: sternal precs; pt has chest binder in place around sternal incision.  Therapy/Group: Individual Therapy  Trilby Way PTA 02/08/2023, 12:57 PM

## 2023-02-08 NOTE — Plan of Care (Signed)
  Problem: RH Dressing Goal: LTG Patient will perform lower body dressing w/assist (OT) Description: LTG: Patient will perform lower body dressing with assist, with/without cues in positioning using equipment (OT) Flowsheets (Taken 02/08/2023 1556) LTG: Pt will perform lower body dressing with assistance level of: (LTG changed from Mod I to set up as pt will need A to don TED hose and ACE wrap on lower legs.) Set up assist Note: LTG changed from Mod I to set up as pt will need A to don TED hose and ACE wrap on lower legs.

## 2023-02-08 NOTE — Progress Notes (Signed)
Occupational Therapy Session Note  Patient Details  Name: Bryan Brock MRN: [id number] Date of Birth: 1956-04-01  Today's Date: 02/08/2023 OT Individual Time: 4098-1191 OT Individual Time Calculation (min): 75 min    Short Term Goals: Week 1:  OT Short Term Goal 1 (Week 1): STGs = LTGs  Skilled Therapeutic Interventions/Progress Updates:    Pt received in recliner ready for therapy.  Focus of therapy session on standing balance and endurance and modification of self care strategies to avoid chest site.  Pt did extremely well with all sit to stands with no assist.  Stood at sink to wash hands.  Pt agreeable to trying shampoo tray. Covered chest with plastic and towels incase of extra water drippage.  Assisted pt with shampoo and rinse and then he dried hair.  Pt requested help to remove sticky spots on chest so obtained adhesive remover and then he bathed UB using CHG wipes (per pt request).  He donned his Tshirt with set up.    Pt trialed both the 12 and 15 inch toilet tongs (simulated) to see which length would be easier with his sternal precautions. Pt did best with 12 inch and he will order them on [employer].    Pt  sat back in recliner and I assisted him donning TED hose on R foot, ACE wraps on left and both non slip socks.   If pt needs to continue with compression socks he may need A from his spouse.    Pt resting in recliner with all needs met. Alarm set and call light in reach.        Therapy Documentation Precautions:  Precautions Precautions: Sternal, Fall Precaution Comments: Watch SpO2, sternal wound vac Restrictions Weight Bearing Restrictions: Yes (Sternum Precautions) RUE Weight Bearing:  (sternum precautions) LUE Weight Bearing: Touch down weight bearing Other Position/Activity Restrictions: sternal precs; pt has chest binder in place around sternal incision.    Vital Signs: Therapy Vitals Temp: 98.3 F (36.8 C) Temp Source: Oral Pulse Rate: 71 Resp: 19 BP:  104/65 Patient Position (if appropriate): Lying Oxygen Therapy SpO2: 96 % O2 Device: Room Air Pain:  No c/o pain  ADL: ADL Eating: Independent Grooming: Supervision/safety Where Assessed-Grooming: Standing at sink Upper Body Bathing: Setup Where Assessed-Upper Body Bathing: Chair Lower Body Bathing: Minimal assistance Where Assessed-Lower Body Bathing: Chair Upper Body Dressing: Setup Lower Body Dressing: Minimal assistance Where Assessed-Lower Body Dressing: Chair Toileting: Supervision/safety Where Assessed-Toileting: Teacher, adult education: Close supervision Toilet Transfer Method: Ambulating   Therapy/Group: Individual Therapy  Cody Oliger 02/08/2023, 3:52 PM

## 2023-02-08 NOTE — Progress Notes (Signed)
PROGRESS NOTE   Subjective/Complaints:  No new concerns this morning.  No events overnight.  He is looking forward to discharge likely next week.  He is attempting to decrease frequency of oxycodone use.  ROS- neg CP, SOB, N/V/D, abdominal pain, headache, sensory or motor changes  Objective:   No results found. No results for input(s): "WBC", "HGB", "HCT", "PLT" in the last 72 hours.  No results for input(s): "NA", "K", "CL", "CO2", "GLUCOSE", "BUN", "CREATININE", "CALCIUM" in the last 72 hours.   Intake/Output Summary (Last 24 hours) at 02/08/2023 2034 Last data filed at 02/08/2023 1200 Gross per 24 hour  Intake 355 ml  Output 400 ml  Net -45 ml         Physical Exam: Vital Signs Blood pressure 116/66, pulse 84, temperature 98.1 F (36.7 C), temperature source Oral, resp. rate 16, height 5\' 11"  (1.803 m), weight (!) 141.6 kg, SpO2 96 %. General: No acute distress Mood and affect are appropriate Heart: Chest with 2 JP drains with serosanguinous output, wound VAC in place with canister empty-Prevena VAC at bedside in box, regular rate and rhythm no rubs murmurs or extra sounds Lungs: Clear to auscultation, breathing unlabored, no rales or wheezes Abdomen: Positive bowel sounds, soft nontender to palpation, nondistended Extremities: No clubbing, cyanosis, or edema Skin: No evidence of breakdown, no evidence of rash, chest wound with wound vac-no drainage noted in canister Neurologic: Cranial nerves II through XII intact, motor strength is 4/5 in bilateral delt  bicep, tricep( sternal prec) , 5/5 grip,4/5 hip flexor, knee extensors, ankle dorsiflexor and plantar flexor Sensory exam normal sensation to light touch and proprioception in bilateral upper and lower extremities  Musculoskeletal:  No joint swelling   Assessment/Plan: 1. Functional deficits which require 3+ hours per day of interdisciplinary therapy in a  comprehensive inpatient rehab setting. Physiatrist is providing close team supervision and 24 hour management of active medical problems listed below. Physiatrist and rehab team continue to assess barriers to discharge/monitor patient progress toward functional and medical goals  Care Tool:  Bathing    Body parts bathed by patient: Right arm, Left arm, Front perineal area, Right upper leg, Left upper leg, Face, Buttocks   Body parts bathed by helper: Chest, Abdomen Body parts n/a: Right lower leg, Left lower leg   Bathing assist Assist Level: Minimal Assistance - Patient > 75%     Upper Body Dressing/Undressing Upper body dressing   What is the patient wearing?: Pull over shirt    Upper body assist Assist Level: Supervision/Verbal cueing    Lower Body Dressing/Undressing Lower body dressing      What is the patient wearing?: Pants     Lower body assist Assist for lower body dressing: Supervision/Verbal cueing     Toileting Toileting    Toileting assist Assist for toileting: Supervision/Verbal cueing     Transfers Chair/bed transfer  Transfers assist     Chair/bed transfer assist level: Minimal Assistance - Patient > 75%     Locomotion Ambulation   Ambulation assist      Assist level: Contact Guard/Touching assist Assistive device: Walker-rolling Max distance: 95'   Walk 10 feet activity   Assist  Assist level: Contact Guard/Touching assist Assistive device: Walker-rolling   Walk 50 feet activity   Assist    Assist level: Contact Guard/Touching assist Assistive device: Walker-rolling    Walk 150 feet activity   Assist Walk 150 feet activity did not occur: Safety/medical concerns         Walk 10 feet on uneven surface  activity   Assist Walk 10 feet on uneven surfaces activity did not occur: Safety/medical concerns         Wheelchair     Assist Is the patient using a wheelchair?: Yes Type of Wheelchair: Manual     Wheelchair assist level: Dependent - Patient 0% Max wheelchair distance: 150    Wheelchair 50 feet with 2 turns activity    Assist    Wheelchair 50 feet with 2 turns activity did not occur:  (sternal precautions)   Assist Level: Dependent - Patient 0% (sternal precautions)   Wheelchair 150 feet activity     Assist  Wheelchair 150 feet activity did not occur:  (sternal precautions)   Assist Level: Dependent - Patient 0% (sternal precautions)   Blood pressure 116/66, pulse 84, temperature 98.1 F (36.7 C), temperature source Oral, resp. rate 16, height 5\' 11"  (1.803 m), weight (!) 141.6 kg, SpO2 96 %.    Medical Problem List and Plan: 1. Functional deficits secondary to debility due to CAD s/p CABG and resulting sternal wound sternal dehiscence and right pectoralis major muscle advancement flap              -patient may not shower             -ELOS/Goals: 5-7 days, Mod I goals PT/OT             -Admit to CIR   -Likely discharge next week   2.  Antithrombotics: -DVT/anticoagulation:  Pharmaceutical: Lovenox             -antiplatelet therapy: Aspirin 325 mg daily   3. Pain Management: Tylenol, oxycodone, tramadol as needed             -gabapentin 200 mg BID Starting to wean Oxy IR, change from Q3h to Q4 h, pt not ready for q6h yet Anticipate home with 1 wk supply of pain meds with plastics f/u  -Will adjust oxycodone to 5-10 every 4 hours   4. Mood/Behavior/Sleep: LCSW to evaluate and provide emotional support; history of bipolar 1 disorder             -antipsychotic agents: n/a             -continue Lamictal 100 mg daily              5. Neuropsych/cognition: This patient is capable of making decisions on his own behalf.   6. Skin/Wound Care: Routine skin care checks             -continue wound VAC and drain>>follow-up with plastic surgery regarding management  -6/15 plastic surgery replaced the [facility] yesterday, plan for 1 more week of wound VAC.  If discharges  early next week can switch from hospital VAC to [facility] machine  -6/16 Prevena VAC is at bedside in preparation for discharge   7. Fluids/Electrolytes/Nutrition: Routine Is and Os and follow-up chemistries             -continue Juven, zinc   8: Hypertension: monitor TID and prn.  Monitor with mobility             -continue Lopressor  37.5 mg BID             -continue Lasix 40 mg daily             -continue Pacerone 200 mg daily  -6/16 well-controlled, continue current regimen Vitals:   02/08/23 1919 02/08/23 1935  BP:  116/66  Pulse:  84  Resp:  16  Temp:  98.1 F (36.7 C)  SpO2: 96% 96%      9: Hyperlipidemia: continue Crestor 10 mg daily   10: DM-2: CBGs QID, carb modified diet, A1c = 6.6% (new diagnosis ??)             -continue SSI             -CBGs appear well-controlled on current regimen  -6/16 CBGs well-controlled CBG (last 3)  Recent Labs    02/07/23 1138 02/07/23 1605 02/08/23 1131  GLUCAP 136* 125* 103*       11: Tobacco use/cough: cessation counseling             -continue nicotine patch             -continue Pulmicort nebs BID             -continue Tussionex BID             -continue Mucinex BID             -continue Robitussin prn   12: CAD: s/p CBAG; on crestor 10mg  and aspirin   13: Post-op atrial fibrillation with RVR; rate has been controlled             -continue Pacerone, Lopressor             -on aspirin 325 mg daily   14: GERD: continue Protonix   15: ABLA: H and H improving, last HGB 9.1 6/11; follow-up CBC -Hgb up to 9.7 on 6/12   16: Hyponatremia, mild 134 on 6/11 : follow-up BMP -Sodium up to 136 on 6/12, recheck Monday   17.  Morbid obesity.  Dietary counseling  18.  Constipation , no BM x 2-3 d enc further weaning of pain meds, change miralax to BID   -Last BM 6/16 improved   LOS: 4 days A FACE TO FACE EVALUATION WAS PERFORMED  Fanny Dance 02/08/2023, 8:34 PM

## 2023-02-09 DIAGNOSIS — E119 Type 2 diabetes mellitus without complications: Secondary | ICD-10-CM

## 2023-02-09 DIAGNOSIS — Z794 Long term (current) use of insulin: Secondary | ICD-10-CM

## 2023-02-09 DIAGNOSIS — K5903 Drug induced constipation: Secondary | ICD-10-CM

## 2023-02-09 DIAGNOSIS — E1159 Type 2 diabetes mellitus with other circulatory complications: Secondary | ICD-10-CM

## 2023-02-09 DIAGNOSIS — I10 Essential (primary) hypertension: Secondary | ICD-10-CM | POA: Diagnosis not present

## 2023-02-09 DIAGNOSIS — R5381 Other malaise: Secondary | ICD-10-CM | POA: Diagnosis not present

## 2023-02-09 DIAGNOSIS — R0789 Other chest pain: Secondary | ICD-10-CM

## 2023-02-09 MED ORDER — OXYCODONE HCL 5 MG PO TABS
5.0000 mg | ORAL_TABLET | ORAL | Status: DC | PRN
  Administered 2023-02-09 – 2023-02-11 (×5): 10 mg via ORAL
  Filled 2023-02-09 (×4): qty 2

## 2023-02-10 ENCOUNTER — Telehealth (HOSPITAL_COMMUNITY): Payer: Self-pay

## 2023-02-10 DIAGNOSIS — I25708 Atherosclerosis of coronary artery bypass graft(s), unspecified, with other forms of angina pectoris: Secondary | ICD-10-CM | POA: Diagnosis not present

## 2023-02-10 DIAGNOSIS — R5381 Other malaise: Secondary | ICD-10-CM | POA: Diagnosis not present

## 2023-02-10 LAB — BASIC METABOLIC PANEL
Anion gap: 20 — ABNORMAL HIGH (ref 5–15)
BUN: 24 mg/dL — ABNORMAL HIGH (ref 8–23)
CO2: 19 mmol/L — ABNORMAL LOW (ref 22–32)
Calcium: 8.9 mg/dL (ref 8.9–10.3)
Chloride: 101 mmol/L (ref 98–111)
Creatinine, Ser: 1.05 mg/dL (ref 0.61–1.24)
GFR, Estimated: >60 mL/min (ref >60)
Glucose, Bld: 103 mg/dL — ABNORMAL HIGH (ref 70–99)
Potassium: 5.2 mmol/L — ABNORMAL HIGH (ref 3.5–5.1)
Sodium: 140 mmol/L (ref 135–145)

## 2023-02-10 LAB — CBC
HCT: 34.4 % — ABNORMAL LOW (ref 39.0–52.0)
Hemoglobin: 10.7 g/dL — ABNORMAL LOW (ref 13.0–17.0)
MCH: 28.5 pg (ref 26.0–34.0)
MCHC: 31.1 g/dL (ref 30.0–36.0)
MCV: 91.5 fL (ref 80.0–100.0)
Platelets: 316 10*3/uL (ref 150–400)
RBC: 3.76 MIL/uL — ABNORMAL LOW (ref 4.22–5.81)
RDW: 15.3 % (ref 11.5–15.5)
WBC: 5.7 10*3/uL (ref 4.0–10.5)
nRBC: 0.4 % — ABNORMAL HIGH (ref 0.0–0.2)

## 2023-02-10 NOTE — Telephone Encounter (Signed)
Per Phase 1 Cardiac Rehab fax referral to [place] 

## 2023-02-10 NOTE — Plan of Care (Signed)
  Problem: RH Balance Goal: LTG Patient will maintain dynamic standing with ADLs (OT) Description: LTG:  Patient will maintain dynamic standing balance with assist during activities of daily living (OT)  Outcome: Completed/Met   Problem: Sit to Stand Goal: LTG:  Patient will perform sit to stand in prep for activites of daily living with assistance level (OT) Description: LTG:  Patient will perform sit to stand in prep for activites of daily living with assistance level (OT) Outcome: Completed/Met   Problem: RH Grooming Goal: LTG Patient will perform grooming w/assist,cues/equip (OT) Description: LTG: Patient will perform grooming with assist, with/without cues using equipment (OT) Outcome: Completed/Met   Problem: RH Bathing Goal: LTG Patient will bathe all body parts with assist levels (OT) Description: LTG: Patient will bathe all body parts with assist levels (OT) Outcome: Completed/Met   Problem: RH Dressing Goal: LTG Patient will perform upper body dressing (OT) Description: LTG Patient will perform upper body dressing with assist, with/without cues (OT). Outcome: Completed/Met Goal: LTG Patient will perform lower body dressing w/assist (OT) Description: LTG: Patient will perform lower body dressing with assist, with/without cues in positioning using equipment (OT) Outcome: Completed/Met   Problem: RH Toileting Goal: LTG Patient will perform toileting task (3/3 steps) with assistance level (OT) Description: LTG: Patient will perform toileting task (3/3 steps) with assistance level (OT)  Outcome: Completed/Met   Problem: RH Simple Meal Prep Goal: LTG Patient will perform simple meal prep w/assist (OT) Description: LTG: Patient will perform simple meal prep with assistance, with/without cues (OT). Outcome: Completed/Met   Problem: RH Toilet Transfers Goal: LTG Patient will perform toilet transfers w/assist (OT) Description: LTG: Patient will perform toilet transfers with  assist, with/without cues using equipment (OT) Outcome: Completed/Met

## 2023-02-10 NOTE — Progress Notes (Signed)
Physical Therapy Session Note  Patient Details  Name: ABDURRAHMAN SOUTHARD MRN: [id number] Date of Birth: 16-Dec-1955  Today's Date: 02/10/2023 PT Individual Time: 1032-1120 PT Individual Time Calculation (min): 48 min   Short Term Goals: Week 1:  PT Short Term Goal 1 (Week 1): STG = LTG due to ELOS   Skilled Therapeutic Interventions/Progress Updates:  Patient seated upright in recliner on entrance to room. Patient alert and agreeable to PT session.   Patient with no pain complaint at start of session.  Therapeutic Activity: Transfers: Pt performed sit<>stand and stand pivot transfers throughout session with Mod I. No vc required.   Gait Training:  Pt ambulated >175 ft x2 using bari RW with supervision. Pt talked throughout ambulation bout which affected his breathing/ activity tolerance. Prior to halfway through first bout, pt had to stop ambulating in order to talk with proper breath control. Provided vc/ tc for attention to breathing and activity tolerance.  8 steps completed with minimal standing rest break between 4 step bouts. BUE on handrails and therapist managing hospital wound vac.   TUG completed in an average of 16.61 seconds  using bari RW and follow with wound vac. (A score of >13.5 seconds indicates patient is at a high fall risk. Pt educated on interpretation of their score)   Patient seated in recliner at end of session with brakes locked, seat pad alarm set, and all needs within reach.   Therapy Documentation Precautions:  Precautions Precautions: Sternal, Fall Precaution Comments: sternal wound vac Restrictions Weight Bearing Restrictions: No RUE Weight Bearing:  (sternum precautions) LUE Weight Bearing: Touch down weight bearing Other Position/Activity Restrictions: sternal precs; pt has chest binder in place around sternal incision. General:   Vital Signs:  Pain:  Pt relates 4/10 pain at sternal surgical site.   Therapy/Group: Individual Therapy  Loel Dubonnet PT, DPT, CSRS 02/10/2023, 12:37 PM

## 2023-02-10 NOTE — Progress Notes (Signed)
PROGRESS NOTE   Subjective/Complaints:  Pt is without new issues takes ~30mg  Oxy per day (10 TID)   ROS- neg CP, SOB, N/V/D, abdominal pain, headache, sensory or motor changes  Objective:   No results found. Recent Labs    02/10/23 0558  WBC 5.7  HGB 10.7*  HCT 34.4*  PLT 316    Recent Labs    02/10/23 0558  NA 140  K 5.2*  CL 101  CO2 19*  GLUCOSE 103*  BUN 24*  CREATININE 1.05  CALCIUM 8.9     Intake/Output Summary (Last 24 hours) at 02/10/2023 0904 Last data filed at 02/10/2023 0809 Gross per 24 hour  Intake 863 ml  Output 1580 ml  Net -717 ml         Physical Exam: Vital Signs Blood pressure 125/76, pulse 71, temperature (!) 97.4 F (36.3 C), resp. rate 18, height 5\' 11"  (1.803 m), weight (!) 139.1 kg, SpO2 99 %. General: No acute distress Mood and affect are appropriate Heart: Chest with 2 JP drains with serosanguinous output, wound VAC in place with canister empty-Prevena VAC at bedside in box, regular rate and rhythm no rubs murmurs or extra sounds Lungs: Clear to auscultation, breathing unlabored, no rales or wheezes Abdomen: Positive bowel sounds, soft nontender to palpation, nondistended Extremities: No clubbing, cyanosis, or edema Skin: No evidence of breakdown, no evidence of rash, chest wound with wound vac-no drainage noted in canister Neurologic: Cranial nerves II through XII intact, motor strength is 4/5 in bilateral delt  bicep, tricep( sternal prec) , 5/5 grip,4/5 hip flexor, knee extensors, ankle dorsiflexor and plantar flexor Sensory exam normal sensation to light touch and proprioception in bilateral upper and lower extremities  Musculoskeletal:  No joint swelling   Assessment/Plan: 1. Functional deficits which require 3+ hours per day of interdisciplinary therapy in a comprehensive inpatient rehab setting. Physiatrist is providing close team supervision and 24 hour management  of active medical problems listed below. Physiatrist and rehab team continue to assess barriers to discharge/monitor patient progress toward functional and medical goals  Care Tool:  Bathing    Body parts bathed by patient: Right arm, Left arm, Front perineal area, Right upper leg, Left upper leg, Face, Buttocks   Body parts bathed by helper: Chest, Abdomen Body parts n/a: Right lower leg, Left lower leg   Bathing assist Assist Level: Minimal Assistance - Patient > 75%     Upper Body Dressing/Undressing Upper body dressing   What is the patient wearing?: Pull over shirt    Upper body assist Assist Level: Supervision/Verbal cueing    Lower Body Dressing/Undressing Lower body dressing      What is the patient wearing?: Pants     Lower body assist Assist for lower body dressing: Supervision/Verbal cueing     Toileting Toileting    Toileting assist Assist for toileting: Supervision/Verbal cueing     Transfers Chair/bed transfer  Transfers assist     Chair/bed transfer assist level: Minimal Assistance - Patient > 75%     Locomotion Ambulation   Ambulation assist      Assist level: Contact Guard/Touching assist Assistive device: Walker-rolling Max distance: 95'   Walk  10 feet activity   Assist     Assist level: Contact Guard/Touching assist Assistive device: Walker-rolling   Walk 50 feet activity   Assist    Assist level: Contact Guard/Touching assist Assistive device: Walker-rolling    Walk 150 feet activity   Assist Walk 150 feet activity did not occur: Safety/medical concerns         Walk 10 feet on uneven surface  activity   Assist Walk 10 feet on uneven surfaces activity did not occur: Safety/medical concerns         Wheelchair     Assist Is the patient using a wheelchair?: Yes Type of Wheelchair: Manual    Wheelchair assist level: Dependent - Patient 0% Max wheelchair distance: 150    Wheelchair 50 feet with 2  turns activity    Assist    Wheelchair 50 feet with 2 turns activity did not occur:  (sternal precautions)   Assist Level: Dependent - Patient 0% (sternal precautions)   Wheelchair 150 feet activity     Assist  Wheelchair 150 feet activity did not occur:  (sternal precautions)   Assist Level: Dependent - Patient 0% (sternal precautions)   Blood pressure 125/76, pulse 71, temperature (!) 97.4 F (36.3 C), resp. rate 18, height 5\' 11"  (1.803 m), weight (!) 139.1 kg, SpO2 99 %.    Medical Problem List and Plan: 1. Functional deficits secondary to debility due to CAD s/p CABG and resulting sternal wound sternal dehiscence and right pectoralis major muscle advancement flap              -patient may not shower             -ELOS/Goals: 5-7 days, Mod I goals PT/OT             -Admit to CIR   -Likely discharge next week   2.  Antithrombotics: -DVT/anticoagulation:  Pharmaceutical: Lovenox             -antiplatelet therapy: Aspirin 325 mg daily   3. Pain Management: Tylenol, oxycodone, tramadol as needed             -gabapentin 200 mg BID Starting to wean Oxy IR, change from Q3h to Q4 h, pt not ready for q6h yet Anticipate home with 1 wk supply of pain meds with plastics f/u  -Will adjust oxycodone to 5-10 every 4 hours, plan to d/c home on Oxy IR 5mg   (#30)take 1-2 BID x 3 d , then 1 BID x 3 d , then 1 every day x1 day then change to tramadol 50mg  (#30)  1 po TID x 3d, BID x 3 d then daily x 1 d  Cont Gabapentin 200mg  BID  Will d/c hydromet, change to guafenesin DM in am    4. Mood/Behavior/Sleep: LCSW to evaluate and provide emotional support; history of bipolar 1 disorder             -antipsychotic agents: n/a             -continue Lamictal 100 mg daily              5. Neuropsych/cognition: This patient is capable of making decisions on his own behalf.   6. Skin/Wound Care: Routine skin care checks             -continue wound VAC and drain>>follow-up with plastic  surgery regarding management  -6/15 plastic surgery replaced the [facility] yesterday, plan for 1 more week of wound VAC.  If discharges early  next week can switch from hospital VAC to [facility] machine  -6/16 Prevena VAC is at bedside in preparation for discharge   7. Fluids/Electrolytes/Nutrition: Routine Is and Os and follow-up chemistries             -continue Juven, zinc   8: Hypertension: monitor TID and prn.  Monitor with mobility             -continue Lopressor 37.5 mg BID             -continue Lasix 40 mg daily             -continue Pacerone 200 mg daily  -6/16 well-controlled, continue current regimen Vitals:   02/09/23 1925 02/10/23 0403  BP: 118/66 125/76  Pulse: 74 71  Resp: 18 18  Temp: 98 F (36.7 C) (!) 97.4 F (36.3 C)  SpO2: 99% 99%      9: Hyperlipidemia: continue Crestor 10 mg daily   10: DM-2: CBGs QID, carb modified diet, A1c = 6.6% (new diagnosis ??)             -continue SSI             -CBGs appear well-controlled on current regimen  -6/16 CBGs well-controlled CBG (last 3)  Recent Labs    02/07/23 1138 02/07/23 1605 02/08/23 1131  GLUCAP 136* 125* 103*   Controlled     11: Tobacco use/cough: cessation counseling             -continue nicotine patch             -continue Pulmicort nebs BID             -continue Tussionex BID             -continue Mucinex BID             -continue Robitussin prn   12: CAD: s/p CBAG; on crestor 10mg  and aspirin   13: Post-op atrial fibrillation with RVR; rate has been controlled             -continue Pacerone, Lopressor             -on aspirin 325 mg daily   14: GERD: continue Protonix   15: ABLA: H and H improving, last HGB 9.1 6/11; follow-up CBC -Hgb up to 9.7 on 6/12   16: Hyponatremia, mild 134 on 6/11 : follow-up BMP -Sodium up to 136 on 6/12, recheck Monday   17.  Morbid obesity.  Dietary counseling  18.  Constipation , no BM x 2-3 d enc further weaning of pain meds, change miralax to BID    -Last BM 6/16 improved   LOS: 6 days A FACE TO FACE EVALUATION WAS PERFORMED  Erick Colace 02/10/2023, 9:04 AM

## 2023-02-10 NOTE — Progress Notes (Signed)
Occupational Therapy Session Note  Patient Details  Name: Bryan Brock MRN: [id number] Date of Birth: August 05, 1956  Today's Date: 02/10/2023 OT Individual Time: 0800-0900 & 1447-1530 OT Individual Time Calculation (min): 60 min & 43 min   Short Term Goals: Week 1:  OT Short Term Goal 1 (Week 1): STGs = LTGs  Skilled Therapeutic Interventions/Progress Updates:  Session 1 Skilled OT intervention completed with focus on ADL retraining, functional ambulation endurance, edema management education. Pt received seated in w/c with nursing, NT and food services all in room, agreeable to session. No pain reported.  OT retrieved walker bag while nursing administer meds. Education provided on purpose with IADL management, and pt placed all his papers, cell phone and "vital items" in it in prep for out of room mobility due to "frequent DC planning and need for being close to phone."  Pt was verbose, and initially slightly impulsive with cues needed for safety and management of RW however pt verbalized feeling hyped up and anxious about getting all his things in line before DC tomorrow. Did improve to a mod I level with cues only needed for direction and breathing technique during mobility. Sit > stands and ambulation >100 ft using RW with mod I during session.  Pt request to use bathroom. Stood, with cues needed to allow OT time to take wound vac with him as it was currently still connected to bed and line in between his legs and pt unaware. Min A for all continent void/BM; charted, however did urinate some in floor but did notice and notify therapist to avoid stepping in and needed assist for wiping only as pt states his "toileting wand is on the way" and would be mod I with it in situation of BM. Pt initially left RW in bathroom, then when cued to take with him he started pulling one handed and to the side to bring out of bathroom instead of ambulating with it. Cues needed to correct. Hand hygiene with mod I  in stance at sink.  Ambulated 100 ft using RW to day room, with no available seats therefore ambulated another 100 ft to main gym with 1 standing rest break for noted SOB. Able to recover with time, and seated rest break. Discussed with pt about breath control and RPE as he is an avid talker which can challenge mobility needs. Ambulated 150 ft back to room, then seated in recliner, OT donned R knee high TED to RLE and ACE with figure 4 style to LLE for edema management. Education provided on figure 4 method.  Pt remained seated in recliner, with chair alarm on/activated, and with all needs in reach at end of session.  Session 2 Skilled OT intervention completed with focus on adaptations to DME, DC planning and ambulatory endurance. Pt received seated in recliner, agreeable to session. No pain reported.  Pt reported his narrow RW was delivered but unsure if it will fit width wise despite it being narrow vs bariatric. Per home measurement sheet, pts area of concern is hall bathroom at 22" width. Narrow RW noted to be 24". OT made adaptation to current RW and switched legs with wheels to opposite leg so wheels would be inside the frame to reduce width to 21". Discussed safety considerations with this technique however pt able to demo during session the ability to navigate with it set up like that in prep for DC.   Pt with questions about the extra pieces in the bag on the new RW. Education  provided on the surfaces (carpet etc) that the different pieces are meant for. Pt reports having several rugs but mostly hard wood. OT showed pt how to switch out the leg stoppers for ease of gliding or more grip when needed. Ambulated with hardwood slides on the walker with mod I > ADL apartment for practice with ambulation on carpet. Noted SOB, but when given firm cues to "no talk walk," pt took it very seriously and noted his increased energy level and less SOB when avoiding talking with mobility.  No difficulty or  safety concern ambulating on carpet, however noted difference by pt. With seated rest break for fatigue, OT and pt discussed further considerations for home management.   Ambulated back to room with mod I using RW, then education provided on self-carrying of the smaller wound vac that is to be placed prior to DC. Advised he carry with cross body strap or safely secured in walker bag but to pay attention to cord management regardless.  Pt remained seated in recliner, with chair alarm on/activated, and with all needs in reach at end of session.   Therapy Documentation Precautions:  Precautions Precautions: Sternal, Fall Precaution Comments: Watch SpO2, sternal wound vac Restrictions Weight Bearing Restrictions: No RUE Weight Bearing:  (sternum precautions) LUE Weight Bearing: Touch down weight bearing Other Position/Activity Restrictions: sternal precs; pt has chest binder in place around sternal incision.    Therapy/Group: Individual Therapy  Melvyn Novas, MS, OTR/L  02/10/2023, 3:49 PM

## 2023-02-10 NOTE — Progress Notes (Signed)
Inpatient Rehabilitation Discharge Medication Review by a Pharmacist  A complete drug regimen review was completed for this patient to identify any potential clinically significant medication issues.  High Risk Drug Classes Is patient taking? Indication by Medication  Antipsychotic No   Anticoagulant No   Antibiotic No   Opioid Yes OxyIR- acute pain Tramadol- acute pain  Antiplatelet Yes Aspirin- CAD  Hypoglycemics/insulin No   Vasoactive Medication Yes Amiodarone, lasix, lopressor- HTN, rate control Viagra- ED  Chemotherapy No   Other Yes Gabapentin- neuropathic pain Lamictal- BP1- depression Crestor- HLD     Type of Medication Issue Identified Description of Issue Recommendation(s)  Drug Interaction(s) (clinically significant)     Duplicate Therapy     Allergy     No Medication Administration End Date     Incorrect Dose     Additional Drug Therapy Needed     Significant med changes from prior encounter (inform family/care partners about these prior to discharge).    Other       Clinically significant medication issues were identified that warrant physician communication and completion of prescribed/recommended actions by midnight of the next day:  No   Time spent performing this drug regimen review (minutes):  30   Stephannie Broner BS, PharmD, BCPS Clinical Pharmacist 02/11/2023 9:30 AM  Contact: [phone] after 3 PM  "Be curious, not judgmental..." -Debbora Dus

## 2023-02-10 NOTE — Progress Notes (Signed)
Physical Therapy Session Note  Patient Details  Name: Bryan Brock MRN: [id number] Date of Birth: [birth year]  Today's Date: 02/10/2023 PT Individual Time: 1305-1400 PT Individual Time Calculation (min): 55 min   Short Term Goals: Week 1:  PT Short Term Goal 1 (Week 1): STG = LTG due to ELOS  Skilled Therapeutic Interventions/Progress Updates:    Pt presents in room in recliner, agreeable to PT. Pt reporting unrated pain in bilateral pecs and sternum that has been ongoing, pt reports premedicated. Session focused on gait training for endurance and tolerance to upright mobility as well as limiting distractions, education on safety at home, and therex with HEP for continued progress at DC. Pt completes sit<>stands modI with RW throughout session.  Pt completes stand to RW and ambulates ~200' from room to far end of main gym supervision, pt would be modI except therapist manages wound vac. Therapist bringing WC in tow in case of fatigue.  Pt then provided with HEP with pt completing each exercise and education provided on safety and desired technique as well as how to progress and downgrade exercises. Pt encouraged to complete standing exercises for first time in front of recliner with wife present. Exercises shown below.  Access Code: [email or web address]  Exercises - Seated Long Arc Quad  - 5 x daily - 7 x weekly - 1 sets - 20 reps - Seated March  - 5 x daily - 7 x weekly - 1 sets - 20 reps - Seated Heel Raise  - 5 x daily - 7 x weekly - 1 sets - 30 reps - Sit to Stand Without Arm Support  - 1 x daily - 7 x weekly - 2 sets - 5 reps - Standing March with Counter Support  - 1 x daily - 7 x weekly - 1 sets - 20 reps - Standing Heel Raise with Support  - 1 x daily - 7 x weekly - 1 sets - 20 reps  Pt ambulates ~200' from main gym to room with RW distant supervision only for managing wound vac, pt with verbal cues prior to gait trial to complete walk without talking to decrease SOB with ambulation with pt  able to complete. Pt educated on CLOF and independence at home, requiring assist only for managing wound vac at this time with pt verbalizing understanding. Pt returns to sitting in recliner in room and remains seated with all needs within reach, call light in place at end of session.  Therapy Documentation Precautions:  Precautions Precautions: Sternal, Fall Precaution Comments: sternal wound vac Restrictions Weight Bearing Restrictions: No RUE Weight Bearing:  (sternum precautions) LUE Weight Bearing: Touch down weight bearing Other Position/Activity Restrictions: sternal precs; pt has chest binder in place around sternal incision.    Therapy/Group: Individual Therapy  Edwin Cap PT, DPT 02/10/2023, 3:43 PM

## 2023-02-10 NOTE — Progress Notes (Signed)
Physical Therapy Discharge Summary  Patient Details  Name: ISAYA CARIAS MRN: [id number] Date of Birth: 1956-04-04  Date of Discharge from PT service:February 10, 2023  Today's Date: 02/10/2023  Patient has met {NUMBERS 0-12:18577} of {NUMBERS 0-12:18577} long term goals due to {due WU:9811914}.  Patient to discharge at [facility] level {LOA:3049010}.   Patient's care partner {care partner:[id number]} to provide the necessary {assistance:3041652} assistance at discharge.  Reasons goals not met: ***  Recommendation:  Patient will benefit from ongoing skilled PT services in {setting:3041680} to continue to advance safe functional mobility, address ongoing impairments in ***, and minimize fall risk.  Equipment: Benjie Karvonen, standard width RW  Reasons for discharge: treatment goals met and discharge from hospital  Patient/family agrees with progress made and goals achieved: Yes  PT Discharge Precautions/Restrictions   Vital Signs   Pain   Pain Interference   Vision/Perception     Cognition   Sensation   Motor     Mobility Transfers Transfers: Sit to Stand;Stand to Sit;Stand Pivot Transfers Sit to Stand: Independent with assistive device (RW) Stand to Sit: Independent with assistive device Stand Pivot Transfers: Independent with assistive device (RW) Transfer (Assistive device): Rolling walker Locomotion     Trunk/Postural Assessment     Balance Balance Balance Assessed: Yes Static Standing Balance Static Standing - Balance Support: During functional activity;Bilateral upper extremity supported Static Standing - Level of Assistance: 6: Modified independent (Device/Increase time) (RW) Dynamic Standing Balance Dynamic Standing - Balance Support: During functional activity;Left upper extremity supported Dynamic Standing - Level of Assistance: 6: Modified independent (Device/Increase time) (RW) Dynamic Standing - Comments: RW for transfers and gait Extremity Assessment             Loel Dubonnet PT, DPT, CSRS 02/10/2023, 6:31 PM

## 2023-02-10 NOTE — Progress Notes (Signed)
Occupational Therapy Discharge Summary  Patient Details  Name: Bryan Brock MRN: [id number] Date of Birth: Jan 04, 1956  Date of Discharge from OT service:February 10, 2023   Patient has met 9 of 9 long term goals due to improved activity tolerance, improved balance, functional use of  RIGHT upper, RIGHT lower, LEFT upper, and LEFT lower extremity, and improved coordination.  Patient to discharge at overall Modified Independent level.  Patient's care partner is independent to provide the necessary physical assistance at discharge.    All goals met  Recommendation:  Patient will benefit from ongoing skilled OT services in home health setting to continue to advance functional skills in the area of BADL, iADL, and Reduce care partner burden.  Equipment: No equipment provided  Reasons for discharge: treatment goals met  Patient/family agrees with progress made and goals achieved: Yes  OT Discharge Precautions/Restrictions  Precautions Precautions: Sternal;Fall Precaution Comments: sternal wound vac Restrictions Weight Bearing Restrictions: No ADL ADL Eating: Independent Where Assessed-Eating: Chair Grooming: Independent Where Assessed-Grooming: Standing at sink Upper Body Bathing: Modified independent Where Assessed-Upper Body Bathing: Sitting at sink Lower Body Bathing: Modified independent Where Assessed-Lower Body Bathing: Standing at sink, Sitting at sink Upper Body Dressing: Independent Where Assessed-Upper Body Dressing: Sitting at sink Lower Body Dressing: Setup Where Assessed-Lower Body Dressing: Sitting at sink, Standing at sink Toileting: Modified independent Where Assessed-Toileting: Neurosurgeon Method: Proofreader: Raised Scientist, research (physical sciences): Not assessed (unable to shower and will sponge bathe at home) [employer] Transfer: Not assessed (unable to shower and will sponge bathe  at home) Vision Baseline Vision/History: 1 Wears glasses Patient Visual Report: No change from baseline Vision Assessment?: No apparent visual deficits Perception  Perception: Within Functional Limits Praxis Praxis: Intact Cognition Cognition Overall Cognitive Status: Within Functional Limits for tasks assessed Arousal/Alertness: Awake/alert Orientation Level: Person;Place;Situation Person: Oriented Place: Oriented Situation: Oriented Memory: Appears intact Awareness: Appears intact Problem Solving: Appears intact Safety/Judgment: Appears intact Brief Interview for Mental Status (BIMS) Repetition of Three Words (First Attempt): 3 Temporal Orientation: Year: Correct Temporal Orientation: Month: Accurate within 5 days Temporal Orientation: Day: Correct Recall: "Sock": Yes, no cue required Recall: "Blue": Yes, no cue required Recall: "Bed": Yes, no cue required BIMS Summary Score: 15 Sensation Sensation Light Touch: Appears Intact Hot/Cold: Appears Intact Proprioception: Appears Intact Stereognosis: Not tested Coordination Gross Motor Movements are Fluid and Coordinated: Yes Fine Motor Movements are Fluid and Coordinated: No Finger Nose Finger Test: Mild tremor bilaterally but WFL Motor  Motor Motor: Within Functional Limits Mobility  Transfers Sit to Stand: Independent with assistive device (RW) Stand to Sit: Independent with assistive device  Trunk/Postural Assessment  Cervical Assessment Cervical Assessment: Within Functional Limits Thoracic Assessment Thoracic Assessment: Within Functional Limits Lumbar Assessment Lumbar Assessment: Within Functional Limits Postural Control Postural Control: Within Functional Limits  Balance Balance Balance Assessed: Yes Static Standing Balance Static Standing - Balance Support: During functional activity;Bilateral upper extremity supported Static Standing - Level of Assistance: 6: Modified independent (Device/Increase  time) (RW) Dynamic Standing Balance Dynamic Standing - Balance Support: During functional activity;Left upper extremity supported Dynamic Standing - Level of Assistance: 6: Modified independent (Device/Increase time) (RW) Dynamic Standing - Comments: RW for transfers and gait Extremity/Trunk Assessment RUE Assessment RUE Assessment: Within Functional Limits LUE Assessment LUE Assessment: Within Functional Limits   Jnya Brossard E Amnah Breuer, MS, OTR/L  02/10/2023, 3:52 PM

## 2023-02-11 ENCOUNTER — Other Ambulatory Visit: Payer: Self-pay | Admitting: Physician Assistant

## 2023-02-11 ENCOUNTER — Ambulatory Visit: Payer: Medicare Other | Admitting: Physician Assistant

## 2023-02-11 DIAGNOSIS — R5381 Other malaise: Secondary | ICD-10-CM | POA: Diagnosis not present

## 2023-02-11 DIAGNOSIS — I25708 Atherosclerosis of coronary artery bypass graft(s), unspecified, with other forms of angina pectoris: Secondary | ICD-10-CM | POA: Diagnosis not present

## 2023-02-11 MED ORDER — POLYETHYLENE GLYCOL 3350 17 G PO PACK: 17.0000 g | PACK | Freq: Two times a day (BID) | ORAL | 0 refills | Status: DC

## 2023-02-11 MED ORDER — DOCUSATE SODIUM 100 MG PO CAPS: 200.0000 mg | ORAL_CAPSULE | Freq: Every day | ORAL | 0 refills | Status: DC

## 2023-02-11 MED ORDER — FUROSEMIDE 40 MG PO TABS: 40.0000 mg | ORAL_TABLET | Freq: Every day | ORAL | 0 refills | Status: DC

## 2023-02-11 MED ORDER — AMIODARONE HCL 200 MG PO TABS: 200.0000 mg | ORAL_TABLET | Freq: Every day | ORAL | 0 refills | Status: DC

## 2023-02-11 MED ORDER — ACETAMINOPHEN 325 MG PO TABS: 325.0000 mg | ORAL_TABLET | ORAL | Status: DC | PRN

## 2023-02-11 MED ORDER — ASPIRIN 325 MG PO TBEC: 325.0000 mg | DELAYED_RELEASE_TABLET | Freq: Every day | ORAL | 0 refills | Status: DC

## 2023-02-11 MED ORDER — ALBUTEROL SULFATE HFA 108 (90 BASE) MCG/ACT IN AERS: 2.0000 | INHALATION_SPRAY | Freq: Four times a day (QID) | RESPIRATORY_TRACT | 0 refills | Status: AC | PRN

## 2023-02-11 MED ORDER — NICOTINE 14 MG/24HR TD PT24: 14.0000 mg | MEDICATED_PATCH | Freq: Every day | TRANSDERMAL | 0 refills | Status: DC

## 2023-02-11 MED ORDER — OXYCODONE HCL 5 MG PO TABS: ORAL_TABLET | ORAL | 0 refills | Status: DC

## 2023-02-11 MED ORDER — METOPROLOL TARTRATE 37.5 MG PO TABS: 37.5000 mg | ORAL_TABLET | Freq: Two times a day (BID) | ORAL | 0 refills | Status: DC

## 2023-02-11 MED ORDER — GABAPENTIN 100 MG PO CAPS: 200.0000 mg | ORAL_CAPSULE | Freq: Two times a day (BID) | ORAL | 0 refills | Status: DC

## 2023-02-11 MED ORDER — TRAMADOL HCL 50 MG PO TABS: ORAL_TABLET | ORAL | 0 refills | Status: DC

## 2023-02-11 NOTE — Progress Notes (Signed)
Patient ID: Bryan Brock, male   DOB: 1955/10/03, 67 y.o.   MRN: [id number]  INPATIENT REHABILITATION DISCHARGE NOTE   Discharge instructions by:  Wendi Maya, PA-C  Verbalized understanding: Yes  Skin care/Wound care healing: JP drains in place, Wound vac changed to home wound vac.  Pain: Pain medication given  IV's: N/A  Tubes/Drains: 2 JP drains  O2: N/A  Safety instructions: Provided  Patient belongings: Returned  Discharged to: Home  Discharged via: Wife

## 2023-02-11 NOTE — Plan of Care (Signed)
  Problem: RH Balance Goal: LTG Patient will maintain dynamic standing balance (PT) Description: LTG:  Patient will maintain dynamic standing balance with assistance during mobility activities (PT) Flowsheets (Taken 02/05/2023 1631 by Edwin Cap, PT) LTG: Pt will maintain dynamic standing balance during mobility activities with:: Independent with assistive device    Problem: Sit to Stand Goal: LTG:  Patient will perform sit to stand with assistance level (PT) Description: LTG:  Patient will perform sit to stand with assistance level (PT) Flowsheets (Taken 02/05/2023 1631 by Edwin Cap, PT) LTG: PT will perform sit to stand in preparation for functional mobility with assistance level: Independent with assistive device   Problem: RH Bed Mobility Goal: LTG Patient will perform bed mobility with assist (PT) Description: LTG: Patient will perform bed mobility with assistance, with/without cues (PT). Flowsheets (Taken 02/05/2023 1631 by Edwin Cap, PT) LTG: Pt will perform bed mobility with assistance level of: Independent   Problem: RH Bed to Chair Transfers Goal: LTG Patient will perform bed/chair transfers w/assist (PT) Description: LTG: Patient will perform bed to chair transfers with assistance (PT). Flowsheets (Taken 02/05/2023 1631 by Edwin Cap, PT) LTG: Pt will perform Bed to Chair Transfers with assistance level: Independent with assistive device    Problem: RH Car Transfers Goal: LTG Patient will perform car transfers with assist (PT) Description: LTG: Patient will perform car transfers with assistance (PT). Flowsheets (Taken 02/05/2023 1631 by Edwin Cap, PT) LTG: Pt will perform car transfers with assist:: Independent with assistive device    Problem: RH Ambulation Goal: LTG Patient will ambulate in controlled environment (PT) Description: LTG: Patient will ambulate in a controlled environment, # of feet with assistance (PT). Flowsheets (Taken 02/05/2023  1631 by Edwin Cap, PT) LTG: Pt will ambulate in controlled environ  assist needed:: Supervision/Verbal cueing LTG: Ambulation distance in controlled environment: 150' Goal: LTG Patient will ambulate in home environment (PT) Description: LTG: Patient will ambulate in home environment, # of feet with assistance (PT). Flowsheets (Taken 02/05/2023 1631 by Edwin Cap, PT) LTG: Pt will ambulate in home environ  assist needed:: Independent with assistive device LTG: Ambulation distance in home environment: 50'   Problem: RH Stairs Goal: LTG Patient will ambulate up and down stairs w/assist (PT) Description: LTG: Patient will ambulate up and down # of stairs with assistance (PT) Flowsheets (Taken 02/05/2023 1631 by Edwin Cap, PT) LTG: Pt will ambulate up/down stairs assist needed:: Supervision/Verbal cueing LTG: Pt will  ambulate up and down number of stairs: 5

## 2023-02-11 NOTE — Progress Notes (Signed)
Patient ID: Bryan Brock, male   DOB: 11-Apr-1956, 67 y.o.   MRN: [id number]  9:30 AM: Sw met with patient and S.O to discuss discharge questions and concerns.  Pt waiting to review medications with  PA. Dme in room. HH discussed, no additional questions or concerns.

## 2023-02-11 NOTE — Progress Notes (Signed)
PROGRESS NOTE   Subjective/Complaints:  No new issues overnite , discussed d/c plans   ROS- neg CP, SOB, N/V/D, abdominal pain, headache, sensory or motor changes  Objective:   No results found. Recent Labs    02/10/23 0558  WBC 5.7  HGB 10.7*  HCT 34.4*  PLT 316    Recent Labs    02/10/23 0558  NA 140  K 5.2*  CL 101  CO2 19*  GLUCOSE 103*  BUN 24*  CREATININE 1.05  CALCIUM 8.9     Intake/Output Summary (Last 24 hours) at 02/11/2023 0912 Last data filed at 02/11/2023 0746 Gross per 24 hour  Intake 1549 ml  Output 1980 ml  Net -431 ml         Physical Exam: Vital Signs Blood pressure 116/70, pulse 72, temperature 98.2 F (36.8 C), resp. rate 18, height 5\' 11"  (1.803 m), weight (!) 145.3 kg, SpO2 98 %. General: No acute distress Mood and affect are appropriate Heart: Chest with 2 JP drains with serosanguinous output on right and serous on left , wound VAC in place with canister empty-Prevena VAC at bedside in box, regular rate and rhythm no rubs murmurs or extra sounds Lungs: Clear to auscultation, breathing unlabored, no rales or wheezes Abdomen: Positive bowel sounds, soft nontender to palpation, nondistended Extremities: No clubbing, cyanosis, or edema Skin: No evidence of breakdown, no evidence of rash, chest wound with wound vac-no drainage noted in canister   Musculoskeletal:  No joint swelling   Assessment/Plan: 1. Functional deficits debility CAD s/p CABG Stable for D/C today F/u PCP 6/25 F/u CVTS 7/10 F/u Plastic surgery 6/20 or 6/21 No PMR f/u needed  See D/C summary See D/C instructions  Care Tool:  Bathing    Body parts bathed by patient: Right arm, Left arm, Front perineal area, Right upper leg, Left upper leg, Face, Buttocks, Chest, Abdomen, Right lower leg, Left lower leg   Body parts bathed by helper: Chest, Abdomen Body parts n/a: Right lower leg, Left lower leg    Bathing assist Assist Level: Independent with assistive device Assistive Device Comment: LH sponge   Upper Body Dressing/Undressing Upper body dressing   What is the patient wearing?: Pull over shirt    Upper body assist Assist Level: Independent    Lower Body Dressing/Undressing Lower body dressing      What is the patient wearing?: Pants     Lower body assist Assist for lower body dressing: Independent with assitive device     Toileting Toileting    Toileting assist Assist for toileting: Independent with assistive device     Transfers Chair/bed transfer  Transfers assist     Chair/bed transfer assist level: Minimal Assistance - Patient > 75%     Locomotion Ambulation   Ambulation assist      Assist level: Contact Guard/Touching assist Assistive device: Walker-rolling Max distance: 95'   Walk 10 feet activity   Assist     Assist level: Contact Guard/Touching assist Assistive device: Walker-rolling   Walk 50 feet activity   Assist    Assist level: Contact Guard/Touching assist Assistive device: Walker-rolling    Walk 150 feet activity   Assist  Walk 150 feet activity did not occur: Safety/medical concerns         Walk 10 feet on uneven surface  activity   Assist Walk 10 feet on uneven surfaces activity did not occur: Safety/medical concerns         Wheelchair     Assist Is the patient using a wheelchair?: Yes Type of Wheelchair: Manual    Wheelchair assist level: Dependent - Patient 0% Max wheelchair distance: 150    Wheelchair 50 feet with 2 turns activity    Assist    Wheelchair 50 feet with 2 turns activity did not occur:  (sternal precautions)   Assist Level: Dependent - Patient 0% (sternal precautions)   Wheelchair 150 feet activity     Assist  Wheelchair 150 feet activity did not occur:  (sternal precautions)   Assist Level: Dependent - Patient 0% (sternal precautions)   Blood pressure  116/70, pulse 72, temperature 98.2 F (36.8 C), resp. rate 18, height 5\' 11"  (1.803 m), weight (!) 145.3 kg, SpO2 98 %.    Medical Problem List and Plan: 1. Functional deficits secondary to debility due to CAD s/p CABG and resulting sternal wound sternal dehiscence and right pectoralis major muscle advancement flap              -patient may not shower             -ELOS/Goals: 5-7 days, Mod I goals PT/OT             -Admit to CIR   -Likely discharge next week   2.  Antithrombotics: -DVT/anticoagulation:  Pharmaceutical: Lovenox             -antiplatelet therapy: Aspirin 325 mg daily   3. Pain Management: Tylenol, oxycodone, tramadol as needed             -gabapentin 200 mg BID Starting to wean Oxy IR, change from Q3h to Q4 h, pt not ready for q6h yet Anticipate home with 1 wk supply of pain meds with plastics f/u  -Will adjust oxycodone to 5-10 every 4 hours, plan to d/c home on Oxy IR 5mg   (#30)take 1-2 BID x 3 d , then 1 BID x 3 d , then 1 every day x1 day then change to tramadol 50mg  (#30)  1 po TID x 3d, BID x 3 d then daily x 1 d  Cont Gabapentin 200mg  BID  Will d/c hydromet, change to guafenesin DM in am    4. Mood/Behavior/Sleep: LCSW to evaluate and provide emotional support; history of bipolar 1 disorder             -antipsychotic agents: n/a             -continue Lamictal 100 mg daily              5. Neuropsych/cognition: This patient is capable of making decisions on his own behalf.   6. Skin/Wound Care: Routine skin care checks             -continue wound VAC and drain>>follow-up with plastic surgery regarding management  -6/15 plastic surgery replaced the [facility] yesterday, plan for 1 more week of wound VAC.  If discharges early next week can switch from hospital VAC to [facility] machine  -6/16 Prevena VAC is at bedside in preparation for discharge   7. Fluids/Electrolytes/Nutrition: Routine Is and Os and follow-up chemistries             -continue Juven, zinc  8:  Hypertension: monitor TID and prn.  Monitor with mobility             -continue Lopressor 37.5 mg BID             -continue Lasix 40 mg daily             -continue Pacerone 200 mg daily  -6/16 well-controlled, continue current regimen Vitals:   02/11/23 0350 02/11/23 0750  BP: 116/70   Pulse: 69 72  Resp: 17 18  Temp: 98.2 F (36.8 C)   SpO2: 97% 98%      9: Hyperlipidemia: continue Crestor 10 mg daily   10: DM-2: CBGs QID, carb modified diet, A1c = 6.6% (new diagnosis ??)             -continue SSI             -CBGs appear well-controlled on current regimen  -6/16 CBGs well-controlled CBG (last 3)  Recent Labs    02/08/23 1131  GLUCAP 103*   Controlled     11: Tobacco use/cough: cessation counseling             -continue nicotine patch             -continue Pulmicort nebs BID             -continue Tussionex BID             -continue Mucinex BID             -continue Robitussin prn   12: CAD: s/p CBAG; on crestor 10mg  and aspirin   13: Post-op atrial fibrillation with RVR; rate has been controlled             -continue Pacerone, Lopressor             -on aspirin 325 mg daily   14: GERD: continue Protonix   15: ABLA: H and H improving, last HGB 9.1 6/11; follow-up CBC -Hgb up to 9.7 on 6/12   16: Hyponatremia, mild 134 on 6/11 : follow-up BMP -Sodium up to 136 on 6/12, recheck Monday   17.  Morbid obesity.  Dietary counseling  18.  Constipation , no BM x 2-3 d enc further weaning of pain meds, change miralax to BID   -Last BM 6/16 improved   LOS: 7 days A FACE TO FACE EVALUATION WAS PERFORMED  Erick Colace 02/11/2023, 9:12 AM

## 2023-02-11 NOTE — Progress Notes (Signed)
Inpatient Rehabilitation Care Coordinator Discharge Note   Patient Details  Name: SHEEHAN BOUTILIER MRN: [id number] Date of Birth: 1956-05-28   Discharge location: Home  Length of Stay: 7 Days  Discharge activity level: MOD I  Home/community participation: Cammie Mcgee  Patient response WU:[id number] Literacy - How often do you need to have someone help you when you read instructions, pamphlets, or other written material from your doctor or pharmacy?: Rarely  Patient response [id number] Isolation - How often do you feel lonely or isolated from those around you?: Never  Services provided included: MD, PT, RD, OT, SLP, RN, CM, Pharmacy, Neuropsych, SW, TR  Financial Services:  Field seismologist Utilized: Medicare    Choices offered to/list presented to: patient  Follow-up services arranged:  Home Health, DME [facility] Agency: [facility]    DME : Rw    Patient response to transportation need: Is the patient able to respond to transportation needs?: Yes In the past 12 months, has lack of transportation kept you from medical appointments or from getting medications?: No In the past 12 months, has lack of transportation kept you from meetings, work, or from getting things needed for daily living?: No   Patient/Family verbalized understanding of follow-up arrangements:  Yes  Individual responsible for coordination of the follow-up plan: self  Confirmed correct DME delivered: Andria Rhein 02/11/2023    Comments (or additional information):    Andria Rhein

## 2023-02-12 ENCOUNTER — Telehealth: Payer: Self-pay | Admitting: Cardiology

## 2023-02-12 ENCOUNTER — Telehealth: Payer: Self-pay

## 2023-02-12 ENCOUNTER — Ambulatory Visit: Payer: Medicare Other | Admitting: Surgical

## 2023-02-12 ENCOUNTER — Telehealth: Payer: Self-pay | Admitting: Surgical

## 2023-02-12 DIAGNOSIS — T8132XA Disruption of internal operation (surgical) wound, not elsewhere classified, initial encounter: Secondary | ICD-10-CM | POA: Diagnosis not present

## 2023-02-12 NOTE — Telephone Encounter (Signed)
Pt c/o medication issue:  1. Name of Medication:  Metoprolol Tartrate 37.5 MG TABS  2. How are you currently taking this medication (dosage and times per day)?   3. Are you having a reaction (difficulty breathing--STAT)?   4. What is your medication issue?   Willaim Rayas states patient was admitted in the hospital for 40 days and he just got discharged yesterday. He was taken off hydrochlorothiazide, Amlodipine, and Valsartan. He was prescribed Meoprolol 37.5 twice daily but hasn't picked it up yet. BP is currently 160/90. Willaim Rayas mentions that patient does not have Amiodarone but it is ready to be picked up. Willaim Rayas also mentions that patient has a question regarding Gabapentin and Oxycodone for pain as well.

## 2023-02-12 NOTE — Telephone Encounter (Signed)
Pt was discharged from inpatient rehab yesterday, to his home under home health services.  Pt was admitted on 5/9 for CABG x 4. 5/14 sternal rewiring due to dehiscence coughing. 5/27 sternal wound I&D with VAC placement. 6/5 s/p Sternal wound irrigation and debridement, placement of myriad and wound VAC change. OR 6/7 for R pec flap with plastic surgery.   Pt was discharged from [facility] inpatient rehab yesterday 6/18, with home health services.     Today his home health nurse Willaim Rayas had her first visit with the pt and wanted to update [place] on the visit and go over all his discharged cardiac medications and confirm these.     Went over all the pts cardiac medications as well as discharge paperwork/instructions/medications from yesterday.   Willaim Rayas said the pts wife is getting all his cardiac medications at the pharmacy this evening, for this morning they were awaiting  stock on his amiodarone and metoprolol tartrate to come in this evening.  The pts pharmacy was able to get this and the wife is on her way to pick them up.    Went over all the pts cardiac medications from discharge yesterday with Willaim Rayas on the phone.  All cardiac medication start/stop/dose changes were endorsed to [facility] on the phone.   Provided her his oxycodone and gabapentin doses from discharge list with Willaim Rayas on the phone.    Informed Kelsie of all the pts post- hospital follow-up appts in the system.  She is aware he will see Keenan Bachelor PA-C with plastic surgery on 6/21 (for wound care/sternal wound/vac),  PCP Dr. Veto Kemps on 6/25, our office with Jari Favre PA-C on 7/2, and Dr. Laneta Simmers w/[facility] on 7/10.   Kelsie asked that I call the pt and go over his cardiac medication list.  Will call the pt and review with him.  Kelsie said the pts BP was a little elevated today when she saw him at the first visit today, at 160/90, due to waiting on his metoprolol and amiodarone to come in stock at the pharmacy this afternoon.   She did  confirm with me that the wife is picking them up now and pt will get all his prescribed PM medications tonight.   Kelsie verbalized understanding and agrees with this plan.  Will call the pt with this information as well as send this to Dr. Shari Prows, to make her aware of this message.

## 2023-02-12 NOTE — Telephone Encounter (Signed)
Called the pt to go over all his discharge instructions and medications.   Pt states he is in clear understanding of all his cardiac medications and instructions provided to him at discharge yesterday.   Pt states his wife is at the pharmacy now getting his metoprolol and amiodarone scripts now.   He states he made all his post-hospital follow-up appts.   He states he is doing well and now settled with all his discharge medications.   Pt is aware of his follow-up appt with our office on 7/2, and to call us if he needs [place] before hand.   Pt states he is doing good and is glad to be home and settled.  He will continue with ordered home health therapy.  Pt verbalized understanding and agrees with this plan.  Pt was appreciative for the follow-up call and checking in on him.  Will send this message to Dr. Shari Prows as a general FYI.

## 2023-02-12 NOTE — Telephone Encounter (Signed)
Bryan Benton, RN received a call from patient's home health nurse Bryan Brock.  Bryan Brock reported that she was there evaluating Bryan Brock and his Prevena wound VAC was beeping and showing that there was a leak on the Prevena monitor.  She reported that he appeared to have a good seal around the incisional VAC on his sternum, but she was going to work on patching it with some Tegaderm dressings to see if she can stop the leak.  I discussed with home health RN that if she is able to seal it then I would leave the [facility] on until Friday at his appointment we can evaluate the area and remove the VAC at that time.  If she is unable to seal the VAC today, I would recommend removing it and placing a dry dressing over it such as Mepilex or ABD pads with medical tape or Medipore tape.  Orders were requested, will fax them today with this note.   Recommended orders:  Recommend changing dry dressing over sternum at a minimum 3 times per week, patient may be able to change this on his own or with assistance from his wife.  Recommend keeping the JP drain insertion site covered with gauze and tape, can change this daily or every other day depending on bathing schedule.  If the wound VAC cannot be sealed today 02/12/2023, recommend removing.  If able to be sealed with Tegaderm, leave in place until patient has appointment on 02/14/2023.  Please notify us of any concerning symptoms or signs when evaluating patient sternal incision.

## 2023-02-12 NOTE — Telephone Encounter (Signed)
It maybe that the vac is not functioning properly. Can you schedule him a visit with [place] sooner than Friday.

## 2023-02-12 NOTE — Transitions of Care (Post Inpatient/ED Visit) (Signed)
   02/12/2023  Name: Bryan Brock MRN: [id number] DOB: 1956-08-18  Today's TOC FU Call Status: Today's TOC FU Call Status:: Unsuccessul Call (1st Attempt) Unsuccessful Call (1st Attempt) Date: 02/12/23  Attempted to reach the patient regarding the most recent Inpatient/ED visit.  Follow Up Plan: Additional outreach attempts will be made to reach the patient to complete the Transitions of Care (Post Inpatient/ED visit) call.   Jodelle Gross, RN, BSN, CCM Care Management Coordinator [place]/[facility]

## 2023-02-12 NOTE — Telephone Encounter (Signed)
Patient verbalized her was discharged yesterday and the wound vac is making a beeping noise and he just wants to make sure the machine is operating properly.  Patient verbalized Wound Vac is charging properly.  Patient verbalized he slept on his right side and it's draining more from that side verses the left side.  Patient just want to make sure machine is operating properly.

## 2023-02-12 NOTE — Telephone Encounter (Signed)
Spoke with pt, he reports the preveena vac has been beeping while it is plugged in and he was concerned about this. Offered patient appointment today at any time, he reports he cannot come today or tomorrow as he has no transportation. I advised him without evaluating the area, I cannot provide any further information about the vac as it would need to be evaluated in person to diagnose any errors.  I discussed with him concerns about if drainage was sitting on his skin if there was a leak in the vac. He verbalized understanding, but cannot come in until Friday afternoon.  Offered sooner appt again, but he cannot come due to his wife working.  He knows to call to notify of any abnormal changes. He reports he feels well.

## 2023-02-12 NOTE — Telephone Encounter (Signed)
Telephone note with wound care recommended orders faxed to Adelina Mings, RN with [facility] per verbal order from [facility], PA-C. Fax confirmation received. Fax # [phone]

## 2023-02-13 ENCOUNTER — Ambulatory Visit: Payer: Medicare Other | Admitting: Surgical

## 2023-02-13 ENCOUNTER — Telehealth: Payer: Self-pay

## 2023-02-13 NOTE — Transitions of Care (Post Inpatient/ED Visit) (Signed)
   02/13/2023  Name: IRISH GONYO MRN: [id number] DOB: [birth year]  Today's TOC FU Call Status: Today's TOC FU Call Status:: Unsuccessful Call (2nd Attempt) Unsuccessful Call (2nd Attempt) Date: 02/13/23  Attempted to reach the patient regarding the most recent Inpatient/ED visit.  Follow Up Plan: Additional outreach attempts will be made to reach the patient to complete the Transitions of Care (Post Inpatient/ED visit) call.    Jodelle Gross, RN, BSN, CCM Care Management Coordinator /[facility] Phone: [phone]/Fax: [phone]

## 2023-02-13 NOTE — Telephone Encounter (Signed)
Requesting: Metoprolol Tartrate 37.5mg  Last Visit: 10/31/22 Next Visit: 02/18/23 Last Refill: 02/11/23 by Wendi Maya  Please Advise   Patient was seen in the hospital.  Drug not covered by patient plan. The preferred alternative is Atenolol, Carvedilol. Please call/ fax the pharmacy to change medication along with strength, directions, quantity, and refills.

## 2023-02-14 ENCOUNTER — Ambulatory Visit (INDEPENDENT_AMBULATORY_CARE_PROVIDER_SITE_OTHER): Payer: Medicare Other | Admitting: Surgical

## 2023-02-14 ENCOUNTER — Encounter: Payer: Self-pay | Admitting: Surgical

## 2023-02-14 ENCOUNTER — Telehealth: Payer: Self-pay

## 2023-02-14 VITALS — BP 146/77 | HR 100

## 2023-02-14 DIAGNOSIS — T8132XD Disruption of internal operation (surgical) wound, not elsewhere classified, subsequent encounter: Secondary | ICD-10-CM

## 2023-02-14 DIAGNOSIS — Z9889 Other specified postprocedural states: Secondary | ICD-10-CM

## 2023-02-14 NOTE — Transitions of Care (Post Inpatient/ED Visit) (Signed)
   02/14/2023  Name: Bryan Brock MRN: [id number] DOB: 03-Mar-1956  Today's TOC FU Call Status: Today's TOC FU Call Status:: Unsuccessful Call (3rd Attempt) Unsuccessful Call (3rd Attempt) Date: 02/14/23  Attempted to reach the patient regarding the most recent Inpatient/ED visit.  Follow Up Plan: No further outreach attempts will be made at this time. We have been unable to contact the patient.  Jodelle Gross, RN, BSN, CCM Care Management Coordinator [place]/[facility] Phone: [phone]/Fax: [phone]

## 2023-02-14 NOTE — Progress Notes (Signed)
   Referring Provider Rudd, Bertram Millard, MD [address] [place],  [place] [address]   CC:  Chief Complaint  Patient presents with   Follow-up      Bryan Brock is an 67 y.o. male.  HPI: Patient is a 67 year old male here for follow-up after right pectoralis muscle advancement flap, placement of myriad wound matrix powder and placement of incisional wound VAC with Dr. Ulice Bold on 01/31/2023 for a sterile wound after CABG.  He was discharged from the hospital on 02/11/2023.  He currently has a Prevena incisional VAC in place, recommendation was to remove this 1 week after placement, I personally replaced the VAC on 02/07/2023 prior to his discharge.  He does have bilateral JP drains in place.  He presents today with his significant other.  He reports he is doing well, he had issues with the [facility] and home health RN helped reseal the area.  He reports it has been beeping over the past few days.   He reports JP drain output from the left side has been approximately 10 to 20 cc per 24 hours, right side has been approximately 30 to 60 cc per 24 hours.  The drainage is serosanguineous at this time.  He is not having any infectious symptoms.  Review of Systems General: No fevers or chills  Physical Exam    02/11/2023    7:50 AM 02/11/2023    5:00 AM 02/11/2023    3:50 AM  Vitals with BMI  Weight  320 lbs 5 oz   BMI  44.7   Systolic   116  Diastolic   70  Pulse 72  69    General:  No acute distress,  Alert and oriented, Non-Toxic, Normal speech and affect Sternum: Sternal midline incision is intact and healing very well.  Prevena wound VAC was removed.  There is no subcutaneous fluid collection noted palpation.  There is no erythema or cellulitic changes noted.  Bilateral JP drains are in place, both drains with serosanguineous drainage noted.  There is no tenderness noted palpation.  Assessment/Plan Patient is a 67 year old male here for follow-up after right pectoralis  muscle advancement flap, placement of myriad wound matrix powder and placement of incisional wound VAC with Dr. Ulice Bold 2 weeks ago.  He is overall doing well, he is not having any infectious symptoms or cardiac symptoms at this time.  Left JP drain was removed due to low output.  He we will follow-up next week for possible removal of the right JP drain depending on output.  Continue with precautions provided by CT, continue to avoid strenuous activities or heavy lifting at this time.  There is no signs of infection or concern on exam.  Bryan Brock 02/14/2023, 3:30 PM

## 2023-02-18 ENCOUNTER — Encounter: Payer: Self-pay | Admitting: Family Medicine

## 2023-02-18 ENCOUNTER — Ambulatory Visit (INDEPENDENT_AMBULATORY_CARE_PROVIDER_SITE_OTHER): Payer: Medicare Other | Admitting: Family Medicine

## 2023-02-18 VITALS — BP 130/78 | HR 76 | Temp 98.6°F | Ht 71.0 in | Wt 297.6 lb

## 2023-02-18 DIAGNOSIS — I2510 Atherosclerotic heart disease of native coronary artery without angina pectoris: Secondary | ICD-10-CM | POA: Diagnosis not present

## 2023-02-18 DIAGNOSIS — T8132XD Disruption of internal operation (surgical) wound, not elsewhere classified, subsequent encounter: Secondary | ICD-10-CM

## 2023-02-18 DIAGNOSIS — R7401 Elevation of levels of liver transaminase levels: Secondary | ICD-10-CM

## 2023-02-18 DIAGNOSIS — R7303 Prediabetes: Secondary | ICD-10-CM

## 2023-02-18 DIAGNOSIS — Z951 Presence of aortocoronary bypass graft: Secondary | ICD-10-CM | POA: Diagnosis not present

## 2023-02-18 DIAGNOSIS — I10 Essential (primary) hypertension: Secondary | ICD-10-CM

## 2023-02-18 LAB — COMPREHENSIVE METABOLIC PANEL
ALT: 22 U/L (ref 0–53)
AST: 20 U/L (ref 0–37)
Albumin: 4 g/dL (ref 3.5–5.2)
Alkaline Phosphatase: 129 U/L — ABNORMAL HIGH (ref 39–117)
BUN: 18 mg/dL (ref 6–23)
CO2: 27 mEq/L (ref 19–32)
Calcium: 9.4 mg/dL (ref 8.4–10.5)
Chloride: 100 mEq/L (ref 96–112)
Creatinine, Ser: 1.15 mg/dL (ref 0.40–1.50)
GFR: 65.59 mL/min (ref >60.00)
Glucose, Bld: 120 mg/dL — ABNORMAL HIGH (ref 70–99)
Potassium: 4.2 mEq/L (ref 3.5–5.1)
Sodium: 138 mEq/L (ref 135–145)
Total Bilirubin: 0.6 mg/dL (ref 0.2–1.2)
Total Protein: 6.5 g/dL (ref 6.0–8.3)

## 2023-02-18 LAB — CBC
HCT: 37.8 % — ABNORMAL LOW (ref 39.0–52.0)
Hemoglobin: 11.6 g/dL — ABNORMAL LOW (ref 13.0–17.0)
MCHC: 30.8 g/dL (ref 30.0–36.0)
MCV: 89.9 fl (ref 78.0–100.0)
Platelets: 313 10*3/uL (ref 150.0–400.0)
RBC: 4.2 Mil/uL — ABNORMAL LOW (ref 4.22–5.81)
RDW: 16.6 % — ABNORMAL HIGH (ref 11.5–15.5)
WBC: 6.7 10*3/uL (ref 4.0–10.5)

## 2023-02-18 LAB — HEMOGLOBIN A1C: Hgb A1c MFr Bld: 5.4 % (ref 4.6–6.5)

## 2023-02-18 NOTE — Assessment & Plan Note (Signed)
Patient's chart indicates he was diagnosed with Type 2 diabetes during his hospitalization. Review of the records shows he had a single A1c of 6.6% with out symptoms of diabetes or hyperglycemia meeting ADA standards. I will repeat his A1c today to assess.

## 2023-02-18 NOTE — Assessment & Plan Note (Signed)
I will reassess liver enzymes. Weight loss may have improved this.

## 2023-02-18 NOTE — Assessment & Plan Note (Signed)
Recent 4 vessel CABG. Continue aspirin 81 mg daily and metoprolol tartrate 37.5 mg bid

## 2023-02-18 NOTE — Progress Notes (Signed)
[facility] [address] [place] [address] Dept: [phone] Dept Fax: [phone]  Chronic Care Office Visit  Subjective:    Patient ID: Bryan Brock, male    DOB: 1955-10-16, 67 y.o..   MRN: [id number]  Chief Complaint  Patient presents with   Medical Management of Chronic Issues    3 month f/u.  BS average 83-125, 176,   History of Present Illness:  Patient is in today for reassessment of chronic medical issues.  Mr. Bryan Brock had been having dyspnea on exertion last Winter. I referred him to cardiology. In March, they admitted him for a cardiac catheterization, which showed multi-vessel CAD. On May 9, he underwent 4-vessel CABG. Post-operatively, he developed an issue with dehiscence of his sternal operative wound. He underwent a sternal rewiring on 5/14. By late may, he had developed infection in his sternal wound, so underwent operative drainage of the wound on 5/27,  revision of the wound on 5/30, operative wound irrigation on 6/5, and then a right pectoralis muscle flap on 6/7. He required a wound vac to assist with closure of his wounds. Currently, he is back home and he feels he is making progress. He has a JP drain still in place. He has home health nursing, OT, and PT coming to his home. He is seeing the wound care nurse once a week.  Mr. Bryan Brock has a history hypertension. He is currently manage don metoprolol. tartrate 37.5 mg bid.  Mr. Bryan Brock has a history of hyperlipidemia. He is managed on rosuvastatin 10 mg daily and omega-3 fatty acids 1 gm daily.  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   OSA (obstructive sleep apnea) 12/19/2021   Primary hypertension 10/29/2021   Elevated transaminase level 10/18/2021   Prediabetes 10/17/2021   Morbid obesity with BMI of 40.0-44.9, adult (HCC) 10/17/2021   Bipolar 1 disorder, depressed (HCC) 05/11/2020   History of basal cell carcinoma 11/05/2007   Hyperlipidemia 11/05/2007   Premature ventricular contractions 11/05/2007   History of mitral valve prolapse 11/05/2007   Past Surgical History:  Procedure Laterality Date   APPLICATION OF WOUND VAC N/A 01/20/2023   Procedure: APPLICATION OF WOUND VAC;  Surgeon: Lovett Sox, MD;  Location: [facility] OR;  Service: Thoracic;  Laterality: N/A;   APPLICATION OF WOUND VAC N/A 01/23/2023   Procedure: APPLICATION OF WOUND VAC;  Surgeon: Peggye Form, DO;  Location: [facility] OR;  Service: Plastics;  Laterality: N/A;   APPLICATION OF WOUND VAC N/A 01/29/2023   Procedure: APPLICATION OF WOUND VAC;  Surgeon: Peggye Form, DO;  Location: [facility] OR;  Service: Plastics;  Laterality: N/A;   CORONARY ARTERY BYPASS GRAFT N/A 01/02/2023   Procedure: CORONARY ARTERY BYPASS GRAFTING (CABG) TIMES FOUR USING THE LEFT INTERNAL MAMMARY ARTERY (LIMA) AND BILATERAL GREATER SAPHENOUS VEIN ARTERIES;  Surgeon: [name], MD;  Location: [facility] OR;  Service: Open Heart Surgery;  Laterality: N/A;  Open median sternotomy   CORONARY PRESSURE/FFR STUDY N/A 11/21/2022   Procedure: INTRAVASCULAR PRESSURE WIRE/FFR  STUDY;  Surgeon: Orbie Pyo, MD;  Location: [facility] INVASIVE CV LAB;  Service: Cardiovascular;  Laterality: N/A;   HYDROCELE EXCISION Bilateral 03/22/2022   Procedure: HYDROCELECTOMY ADULT;  Surgeon: Noel Christmas, MD;  Location: [facility] ORS;  Service: Urology;  Laterality: Bilateral;  2 HRS   INCISION AND DRAINAGE OF WOUND N/A 01/29/2023   Procedure:  Sternal wound irrigation and debridement, placement of myriad and wound VAC change;  Surgeon: Peggye Form, DO;  Location: [facility] OR;  Service: Plastics;  Laterality: N/A;   INCISION AND DRAINAGE OF WOUND N/A 01/31/2023   Procedure: IRRIGATION AND DEBRIDEMENT OF STERNAL WOUND;  Surgeon: Peggye Form, DO;  Location: [facility] OR;  Service: Plastics;  Laterality: N/A;   LEFT HEART CATH AND CORONARY ANGIOGRAPHY N/A 11/21/2022   Procedure: LEFT HEART CATH AND CORONARY ANGIOGRAPHY;  Surgeon: Orbie Pyo, MD;  Location: [facility] INVASIVE CV LAB;  Service: Cardiovascular;  Laterality: N/A;   MOHS SURGERY  1990   PECTORALIS FLAP Right 01/31/2023   Procedure: RIGHT PECTORALIS FLAP;  Surgeon: Peggye Form, DO;  Location: [facility] OR;  Service: Plastics;  Laterality: Right;   STERNAL INCISION RECLOSURE N/A 01/07/2023   Procedure: STERNAL REWIRING;  Surgeon: [name], MD;  Location: [facility] OR;  Service: Thoracic;  Laterality: N/A;   STERNAL WOUND DEBRIDEMENT N/A 01/20/2023   Procedure: DRAIN STERNAL INFECTION;  Surgeon: Lovett Sox, MD;  Location: [facility] OR;  Service: Thoracic;  Laterality: N/A;   STERNAL WOUND DEBRIDEMENT N/A 01/23/2023   Procedure: Excision of sternal wound with Myriad;  Surgeon: Peggye Form, DO;  Location: [facility] OR;  Service: Plastics;  Laterality: N/A;   STRABISMUS SURGERY Left    TEE WITHOUT CARDIOVERSION N/A 01/02/2023   Procedure: TRANSESOPHAGEAL ECHOCARDIOGRAM;  Surgeon: [name], MD;  Location: [facility] OR;  Service: Open Heart Surgery;  Laterality: N/A;   TONSILLECTOMY AND ADENOIDECTOMY     WISDOM TOOTH EXTRACTION     Family History  Problem Relation Age of Onset   Heart disease Mother    Diabetes Mother    Obesity Mother    Hyperlipidemia Mother    Cancer Father        skin cancer   Heart disease Father    Hyperlipidemia Father    Colon cancer Neg Hx    Esophageal cancer Neg Hx    Rectal cancer Neg Hx    Stomach cancer Neg Hx    Outpatient Medications Prior to  Visit  Medication Sig Dispense Refill   albuterol (VENTOLIN HFA) 108 (90 Base) MCG/ACT inhaler Inhale 2 puffs into the lungs every 6 (six) hours as needed for wheezing or shortness of breath. 6.7 g 0   amiodarone (PACERONE) 200 MG tablet Take 1 tablet (200 mg total) by mouth daily. 30 tablet 0   APPLE CIDER VINEGAR PO Take 15 mLs by mouth every Monday, Wednesday, and Friday.     Ascorbic Acid (VITAMIN C) 1000 MG tablet Take 3,000 mg by mouth daily.     aspirin EC 325 MG tablet Take 1 tablet (325 mg total) by mouth daily. 30 tablet 0   calcium elemental as carbonate (TUMS ULTRA 1000) 400 MG chewable tablet Chew 2,000 mg by mouth 3 (three) times daily as needed for heartburn.     Cholecalciferol (DIALYVITE VITAMIN D 5000) 125 MCG (5000 UT) capsule Take 20,000 Units by mouth daily.     Coenzyme Q10 (COQ-10) 100 MG CAPS Take 100 mg by mouth daily.     docusate  sodium (COLACE) 100 MG capsule Take 2 capsules (200 mg total) by mouth daily. 30 capsule 0   Flaxseed, Linseed, (FLAX SEED OIL) 1000 MG CAPS Take 1,000 mg by mouth daily.     folic acid (FOLVITE) 800 MCG tablet Take 800 mcg by mouth daily.     furosemide (LASIX) 40 MG tablet Take 1 tablet (40 mg total) by mouth daily. 30 tablet 0   gabapentin (NEURONTIN) 100 MG capsule Take 2 capsules (200 mg total) by mouth 2 (two) times daily. 120 capsule 0   lamoTRIgine (LAMICTAL) 100 MG tablet Take 100 mg by mouth daily.     Liniments (BLUE-EMU SUPER STRENGTH EX) Apply 1 Application topically daily as needed (pain).     Metoprolol Tartrate 37.5 MG TABS Take 1 tablet (37.5 mg total) by mouth 2 (two) times daily. 60 tablet 0   Multiple Vitamins-Minerals (MULTIVITAMIN WITH MINERALS) tablet Take 1 tablet by mouth daily.     Omega 3 1000 MG CAPS Take 1,000 mg by mouth daily.     OVER THE COUNTER MEDICATION Nano CBD OIL: Pt takes 20 drops in the morning and 20 drops in the evening.     oxyCODONE (OXY IR/ROXICODONE) 5 MG immediate release tablet Take 1-2 BID x 3  d , then 1 BID x 3 d , then 1 every day x1 day then change to tramadol 30 tablet 0   polyethylene glycol (MIRALAX / GLYCOLAX) 17 g packet Take 17 g by mouth 2 (two) times daily. 14 each 0   sildenafil (VIAGRA) 100 MG tablet Take 100 mg by mouth daily as needed for erectile dysfunction.     traMADol (ULTRAM) 50 MG tablet 1 po TID x 3d, BID x 3 d then daily x 1 d 30 tablet 0   zinc gluconate 50 MG tablet Take 50 mg by mouth daily.     acetaminophen (TYLENOL) 325 MG tablet Take 1-2 tablets (325-650 mg total) by mouth every 4 (four) hours as needed for mild pain. (Patient not taking: Reported on 02/18/2023)     rosuvastatin (CRESTOR) 10 MG tablet Take 1 tablet (10 mg total) by mouth daily. (Patient not taking: Reported on 02/18/2023) 90 tablet 3   nicotine (NICODERM CQ - DOSED IN MG/24 HOURS) 14 mg/24hr patch Place 1 patch (14 mg total) onto the skin daily. 28 patch 0   No facility-administered medications prior to visit.   Allergies  Allergen Reactions   Latex Dermatitis   Lisinopril Cough   Objective:   Today's Vitals   02/18/23 1105  BP: 130/78  Pulse: 76  Temp: 98.6 F (37 C)  TempSrc: Temporal  SpO2: 97%  Weight: 297 lb 9.6 oz (135 kg)  Height: 5\' 11"  (1.803 m)   Body mass index is 41.51 kg/m.   General: Well developed, well nourished. No acute distress. Chest: Dressings in place and elastic covering around chest. JP drain with small amount of serous fluid. Psych: Alert and oriented. Normal mood and affect.  Health Maintenance Due  Topic Date Due   FOOT EXAM  Never done   OPHTHALMOLOGY EXAM  Never done   Diabetic kidney evaluation - Urine ACR  Never done   Hepatitis C Screening  Never done   Zoster Vaccines- Shingrix (1 of 2) Never done   Medicare Annual Wellness (AWV)  03/06/2023     Lab Results: Component Ref Range & Units 1 mo ago 11 mo ago 1 yr ago 2 yr ago  Hgb A1c MFr Bld 4.6 -  6.5 % 6.6 High  R, CM 6.4 High  R, CM 6.3 CM 6.0 CM   Assessment & Plan:   Problem  List Items Addressed This Visit       Cardiovascular and Mediastinum   Primary hypertension    Blood pressure is adequately controlled. Continue metoprolol tartrate 37.5 mg bid.      Coronary artery disease    Recent 4 vessel CABG. Continue aspirin 81 mg daily and metoprolol tartrate 37.5 mg bid        Other   Prediabetes    Patient's chart indicates he was diagnosed with Type 2 diabetes during his hospitalization. Review of the records shows he had a single A1c of 6.6% with out symptoms of diabetes or hyperglycemia meeting ADA standards. I will repeat his A1c today to assess.      Relevant Orders   Hemoglobin A1c (Completed)   Comprehensive metabolic panel (Completed)   Elevated transaminase level    I will reassess liver enzymes. Weight loss may have improved this.      Relevant Orders   Comprehensive metabolic panel (Completed)   S/P CABG x 4   Sternal wound dehiscence - Primary    Continue to work with wound care and surgeon regarding healing of this surgical wound.      Relevant Orders   Comprehensive metabolic panel (Completed)   CBC (Completed)    Return in about 6 weeks (around 04/01/2023) for Reassessment.   Loyola Mast, MD

## 2023-02-18 NOTE — Assessment & Plan Note (Signed)
Continue to work with wound care and surgeon regarding healing of this surgical wound.

## 2023-02-18 NOTE — Assessment & Plan Note (Signed)
Blood pressure is adequately controlled. Continue metoprolol tartrate 37.5 mg bid.

## 2023-02-19 ENCOUNTER — Ambulatory Visit: Payer: Medicare Other | Admitting: Surgery

## 2023-02-19 LAB — FUNGUS CULTURE RESULT

## 2023-02-19 LAB — FUNGUS CULTURE WITH STAIN

## 2023-02-19 LAB — FUNGAL ORGANISM REFLEX

## 2023-02-21 ENCOUNTER — Ambulatory Visit (INDEPENDENT_AMBULATORY_CARE_PROVIDER_SITE_OTHER): Payer: Medicare Other | Admitting: Physician Assistant

## 2023-02-21 ENCOUNTER — Encounter: Payer: Self-pay | Admitting: Physician Assistant

## 2023-02-21 VITALS — BP 148/79 | HR 94 | Ht 71.0 in | Wt 297.0 lb

## 2023-02-21 DIAGNOSIS — T8132XD Disruption of internal operation (surgical) wound, not elsewhere classified, subsequent encounter: Secondary | ICD-10-CM

## 2023-02-21 NOTE — Progress Notes (Signed)
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 Ulice Bold who presents to clinic for postoperative follow-up.  He was last seen here in clinic on 02/14/2023.  At that time, left-sided drain experiencing 10 to 20 cc output per day with right-sided drain at 30 to 60 cc output per day.  Left-sided drain removed without complication or difficulty.  Prevena wound VAC removed.  Exam is benign.  Plan to leave right-sided drain in place until output is less than 30 cc/day.  Continue with precautions provided by cardiothoracic surgery.  No heavy lifting or stimulus activities.  Today, patient is doing well.  Reports that his right-sided drain output has varied between 20 to 30 cc x 5 days.  However, he tells me that his father actually passed away from complications related to mediastinitis and pseudomonal infection subsequent to complications related to CABG.  For that reason, would prefer to err on the side of caution and leave the drain in a little bit longer.  He does have a little bit of redness around the drain tube insertion site as well as his left-sided drain tube insertion site that was removed last week.  Denies any fevers, pain, or other symptoms.  He does endorse some generalized numbness across his surgical site.  On exam, incision appears to be healing nicely, some incisional scabbing noted.  Around the drain tube on the right, there is some scabbing and mild erythema.  No significant tenderness or induration appreciated, likely irritation.  Applied thin film of Vaseline.  As for the left-sided drain tube insertion site from drain that was removed last week, there was a little bit of slough appreciated.  No overt purulence or malodor.  No expressible drainage.  Similar surrounding irritation, but no obvious cellulitis.  Recommending Vashe soaked  gauze application to his incision as well as drain tube insertion sites daily x 10 minutes each.  After washing the areas, apply Vaseline around the drain tube and drain tube insertion site.  Also advised thin film of Vaseline to the central incision that appears to be healing nicely and without any obvious incisional wounds.  No evidence on exam concerning for infection.  Recommend close follow-up next week for likely right-sided drain removal assuming volume output remains low.  Will obtain photos at that time.

## 2023-02-24 NOTE — Progress Notes (Unsigned)
Cardiology Office Note:  .   Date:  02/24/2023  ID:  Bryan Brock, DOB 1956/06/09, MRN [id number] PCP: Bryan Mast, MD  [facility] Providers Cardiologist:  None { Click to update primary MD,subspecialty MD or APP then REFRESH:1}   History of Present Illness: .   Bryan Brock is a 67 y.o. male with a past medical history of MVP, symptomatic PVCs, hypertension, hyperlipidemia, family history of CAD, and morbid obesity here for follow-up appointment.  Was referred to cardiology for evaluation of MVP.  Diagnosed at age 67 to 67 years old.  Previously seen by Dr. Orvan Brock.  Around that time, developed sudden syncopal episode.  Described prodrome of warm rashes, lightheadedness and arrhythmias.  TTE at that time with mild MVP.  Holter per report with no significant arrhythmias.  Then was followed by Dr. Tresa Brock but had not been seen in several years.  Seen Dr. Shari Brock 11/08/2021.  At that time blood pressure was elevated.  Started on lisinopril/HCTZ.  5 episodes of palpitations or skipped beats.  They were lasting 30 seconds to several hours.  Usually occurring every 2 to 3 months.  Wanted to pursue a weight loss program.  Nuclear stress test showed no evidence of ischemia or infarct.  Echo revealed normal LVEF, G1 DD, mild LVH, no RWMA, elongated mitral valve leaflets with no obvious prolapse.  Cleared for surgery 12/31/2021.  Seen 02/14/2022 to complete surgical clearance.  Nutrition counseling was performed.  Seen by Dr. Shari Brock 07/29/2022 and at that time he gained 12 to 16 pounds.  Palpitations occurred infrequently but lasted for hours at a time.  Stopped his blood pressure medicine (lisinopril/HCTZ) his lisinopril was increased to 40 mg daily and HCTZ 25 mg daily also amlodipine was increased to 5 mg daily.  Lengthy discussion about the importance of weight loss.  Was seen March 2024 and reported having a cough that persisted for over a year.  COVID infection January 2023.  Also recent URI  at that time.  BP was well-controlled.  Continued to have some dyspnea on exertion.  Works at a [employer] and is up on his feet quite a bit.  Endorsed fatigue at the end of the shift.  1+ pitting edema.  Today, he***  ROS: ***  Studies Reviewed: Marland Kitchen        Bryan Brock 12/14/21     Findings are consistent with no prior ischemia and no prior myocardial infarction. The study is intermediate risk.   No ST deviation was noted.   Left ventricular function is abnormal. Nuclear stress EF: 48 %. The left ventricular ejection fraction is mildly decreased (45-54%). End diastolic cavity size is normal. End systolic cavity size is normal.   Prior study not available for comparison.   Findings: Negative for stress induced arrhythmias. No evidence of ischemia or infarction. LV function is mildly reduced.   Conclusions: Stress test is negative. Intermediate risk due to decrease LV function, thought function look better on concomitant echo   Echo 12/14/21   1. Left ventricular ejection fraction, by estimation, is 65 to 70%. The  left ventricle has normal function. The left ventricle has no regional  wall motion abnormalities. There is mild left ventricular hypertrophy.  Left ventricular diastolic parameters  are consistent with Grade I diastolic dysfunction (impaired relaxation).  The average left ventricular global longitudinal strain is -19.8 %. The  global longitudinal strain is normal.   2. Right ventricular systolic function is normal. The right ventricular  size is normal.   3. The mitral valve leaflets are elongated - no obvious prolapse. The  mitral valve is abnormal. Trivial mitral valve regurgitation.   4. The aortic valve is tricuspid. Aortic valve regurgitation is not  visualized.   5. The inferior vena cava is normal in size with greater than 50%  respiratory variability, suggesting right atrial pressure of 3 mmHg.   6. Cannot exclude a small PFO.    Risk  Assessment/Calculations:   {Does this patient have ATRIAL FIBRILLATION?:[id number]} No BP recorded.  {Refresh Note OR Click here to enter BP  :1}***       Physical Exam:   VS:  There were no vitals taken for this visit.   Wt Readings from Last 3 Encounters:  02/21/23 297 lb (134.7 kg)  02/18/23 297 lb 9.6 oz (135 kg)  02/11/23 (!) 320 lb 5.3 oz (145.3 kg)    GEN: Well nourished, well developed in no acute distress NECK: No JVD; No carotid bruits CARDIAC: ***RRR, no murmurs, rubs, gallops RESPIRATORY:  Clear to auscultation without rales, wheezing or rhonchi  ABDOMEN: Soft, non-tender, non-distended EXTREMITIES:  No edema; No deformity   ASSESSMENT AND PLAN: .   1.  DOE 2.  Hypertension 3.  HFpEF 4.  Palpitations 5.  CAD 6.  Hyperlipidemia 7.  OSA 8.  Chronic cough    {Are you ordering a CV Procedure (e.g. stress test, cath, DCCV, TEE, etc)?   Press F2        :[id number]}  Dispo: ***  Signed, Bryan Dory, PA-C

## 2023-02-25 ENCOUNTER — Ambulatory Visit (INDEPENDENT_AMBULATORY_CARE_PROVIDER_SITE_OTHER): Payer: Medicare Other | Admitting: Physician Assistant

## 2023-02-25 ENCOUNTER — Encounter: Payer: Self-pay | Admitting: Physician Assistant

## 2023-02-25 ENCOUNTER — Ambulatory Visit: Payer: Medicare Other | Attending: Physician Assistant | Admitting: Physician Assistant

## 2023-02-25 VITALS — BP 144/77 | HR 72 | Ht 71.0 in | Wt 293.0 lb

## 2023-02-25 VITALS — BP 124/76 | HR 72 | Ht 71.0 in | Wt 295.8 lb

## 2023-02-25 DIAGNOSIS — R0609 Other forms of dyspnea: Secondary | ICD-10-CM | POA: Diagnosis present

## 2023-02-25 DIAGNOSIS — R0602 Shortness of breath: Secondary | ICD-10-CM | POA: Diagnosis present

## 2023-02-25 DIAGNOSIS — Z79899 Other long term (current) drug therapy: Secondary | ICD-10-CM

## 2023-02-25 DIAGNOSIS — R5383 Other fatigue: Secondary | ICD-10-CM | POA: Diagnosis not present

## 2023-02-25 DIAGNOSIS — E785 Hyperlipidemia, unspecified: Secondary | ICD-10-CM

## 2023-02-25 DIAGNOSIS — I5030 Unspecified diastolic (congestive) heart failure: Secondary | ICD-10-CM

## 2023-02-25 DIAGNOSIS — I25810 Atherosclerosis of coronary artery bypass graft(s) without angina pectoris: Secondary | ICD-10-CM | POA: Diagnosis present

## 2023-02-25 DIAGNOSIS — G4733 Obstructive sleep apnea (adult) (pediatric): Secondary | ICD-10-CM

## 2023-02-25 DIAGNOSIS — Z8679 Personal history of other diseases of the circulatory system: Secondary | ICD-10-CM | POA: Diagnosis not present

## 2023-02-25 DIAGNOSIS — I10 Essential (primary) hypertension: Secondary | ICD-10-CM

## 2023-02-25 DIAGNOSIS — T8132XD Disruption of internal operation (surgical) wound, not elsewhere classified, subsequent encounter: Secondary | ICD-10-CM

## 2023-02-25 MED ORDER — FUROSEMIDE 40 MG PO TABS: 40.0000 mg | ORAL_TABLET | ORAL | 3 refills | Status: DC

## 2023-02-25 NOTE — Progress Notes (Signed)
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 Ulice Bold who presents to clinic for postoperative follow-up.   He was last seen here in clinic on 02/21/2023.  At that time, output had slowed considerably, but decided to hold off on drain removal for little bit longer.  Exam was largely reassuring.  Some incisional scabbing noted.  Some erythema surrounding the drain tube insertion site, but no induration or tenderness.  Likely related to irritation, recommended Vaseline.  No expressible drainage.  Recommended Vashe soaked gauze application to his incision and drain tube insertion sites daily x 10 minutes each.  Return in 1 week for likely drain removal and photos.  Today, patient is doing well.  He states that he ordered the Vashe wound cleanser over the weekend, but has not yet arrived.  He has been applying Vaseline to his incisions throughout.  He feels as though the scabbing has improved.  As for the right-sided drain, reports that has been functioning well.  Output has varied between 15 to 20 cc over the course the past 4 days.  He feels prepared for drain tube removal today.  On exam, incisions appear to be healing nicely.  Drain tube intact and functional.  Removed without complication or difficulty.  Small amount of erythema around right-sided drain tube insertion site, but suspect due to irritated skin rather than infection.  Drain tube insertion site itself appears clean, no expressible purulence.  No malodor or induration.  No crepitus.  Left-sided drain tube insertion site still has some slough at the base.  All other incisions appear to be healing nicely.  Recommend continued Vaseline to all of the incisions with the exception of the right sided drain tube insertion site.  At that site, recommend dry gauze x 5 days until drainage has  stalled at which point can transition to Vaseline daily.  Recommend Vashe for 10 minutes throughout prior to application of Vaseline.  Do this all daily.  Follow-up in 2 weeks, sooner if needed.  Patient is pleased with progress.  Picture(s) obtained of the patient and placed in the chart were with the patient's or guardian's permission.

## 2023-02-25 NOTE — Patient Instructions (Signed)
Medication Instructions:  1.Decrease lasix to 40 mg every other day 2.Continue to hold valsartan, hydrochlorothiazide and amlodipine *If you need a refill on your cardiac medications before your next appointment, please call your pharmacy*  Lab Work: Schedule lipids and lft's for a day that you can come fasting If you have labs (blood work) drawn today and your tests are completely normal, you will receive your results only by: MyChart Message (if you have MyChart) OR A paper copy in the mail If you have any lab test that is abnormal or we need to change your treatment, we will call you to review the results.  Follow-Up: At [facility], you and your health needs are our priority.  As part of our continuing mission to provide you with exceptional heart care, we have created designated Provider Care Teams.  These Care Teams include your primary Cardiologist (physician) and Advanced Practice Providers (APPs -  Physician Assistants and Nurse Practitioners) who all work together to provide you with the care you need, when you need it.  Your next appointment:   3 month(s)  Provider:   Dr Lynnette Caffey  Other Instructions Check your blood pressure daily, 1 hr after morning medications for 2 weeks, keep a log and send [place] the readings through mychart at the end of the 2 weeks.  Low-Sodium Eating Plan Salt (sodium) helps you keep a healthy balance of fluids in your body. Too much sodium can raise your blood pressure. It can also cause fluid and waste to be held in your body. Your health care provider or dietitian may recommend a low-sodium eating plan if you have high blood pressure (hypertension), kidney disease, liver disease, or heart failure. Eating less sodium can help lower your blood pressure and reduce swelling. It can also protect your heart, liver, and kidneys. What are tips for following this plan? Reading food labels  Check food labels for the amount of sodium per serving. If you  eat more than one serving, you must multiply the listed amount by the number of servings. Choose foods with less than 140 milligrams (mg) of sodium per serving. Avoid foods with 300 mg of sodium or more per serving. Always check how much sodium is in a product, even if the label says "unsalted" or "no salt added." Shopping  Buy products labeled as "low-sodium" or "no salt added." Buy fresh foods. Avoid canned foods and pre-made or frozen meals. Avoid canned, cured, or processed meats. Buy breads that have less than 80 mg of sodium per slice. Cooking  Eat more home-cooked food. Try to eat less restaurant, buffet, and fast food. Try not to add salt when you cook. Use salt-free seasonings or herbs instead of table salt or sea salt. Check with your provider or pharmacist before using salt substitutes. Cook with plant-based oils, such as canola, sunflower, or olive oil. Meal planning When eating at a restaurant, ask if your food can be made with less salt or no salt. Avoid dishes labeled as brined, pickled, cured, or smoked. Avoid dishes made with soy sauce, miso, or teriyaki sauce. Avoid foods that have monosodium glutamate (MSG) in them. MSG may be added to some restaurant food, sauces, soups, bouillon, and canned foods. Make meals that can be grilled, baked, poached, roasted, or steamed. These are often made with less sodium. General information Try to limit your sodium intake to 1,500-2,300 mg each day, or the amount told by your provider. What foods should I eat? Fruits Fresh, frozen, or canned  fruit. Fruit juice. Vegetables Fresh or frozen vegetables. "No salt added" canned vegetables. "No salt added" tomato sauce and paste. Low-sodium or reduced-sodium tomato and vegetable juice. Grains Low-sodium cereals, such as oats, puffed wheat and rice, and shredded wheat. Low-sodium crackers. Unsalted rice. Unsalted pasta. Low-sodium bread. Whole grain breads and whole grain pasta. Meats and  other proteins Fresh or frozen meat, poultry, seafood, and fish. These should have no added salt. Low-sodium canned tuna and salmon. Unsalted nuts. Dried peas, beans, and lentils without added salt. Unsalted canned beans. Eggs. Unsalted nut butters. Dairy Milk. Soy milk. Cheese that is naturally low in sodium, such as ricotta cheese, fresh mozzarella, or Swiss cheese. Low-sodium or reduced-sodium cheese. Cream cheese. Yogurt. Seasonings and condiments Fresh and dried herbs and spices. Salt-free seasonings. Low-sodium mustard and ketchup. Sodium-free salad dressing. Sodium-free light mayonnaise. Fresh or refrigerated horseradish. Lemon juice. Vinegar. Other foods Homemade, reduced-sodium, or low-sodium soups. Unsalted popcorn and pretzels. Low-salt or salt-free chips. The items listed above may not be all the foods and drinks you can have. Talk to a dietitian to learn more. What foods should I avoid? Vegetables Sauerkraut, pickled vegetables, and relishes. Olives. [place] fries. Onion rings. Regular canned vegetables, except low-sodium or reduced-sodium items. Regular canned tomato sauce and paste. Regular tomato and vegetable juice. Frozen vegetables in sauces. Grains Instant hot cereals. Bread stuffing, pancake, and biscuit mixes. Croutons. Seasoned rice or pasta mixes. Noodle soup cups. Boxed or frozen macaroni and cheese. Regular salted crackers. Self-rising flour. Meats and other proteins Meat or fish that is salted, canned, smoked, spiced, or pickled. Precooked or cured meat, such as sausages or meat loaves. Tomasa Blase. Ham. Pepperoni. Hot dogs. Corned beef. Chipped beef. Salt pork. Jerky. Pickled herring, anchovies, and sardines. Regular canned tuna. Salted nuts. Dairy Processed cheese and cheese spreads. Hard cheeses. Cheese curds. Blue cheese. Feta cheese. String cheese. Regular cottage cheese. Buttermilk. Canned milk. Fats and oils Salted butter. Regular margarine. Ghee. Bacon fat. Seasonings  and condiments Onion salt, garlic salt, seasoned salt, table salt, and sea salt. Canned and packaged gravies. Worcestershire sauce. Tartar sauce. Barbecue sauce. Teriyaki sauce. Soy sauce, including reduced-sodium soy sauce. Steak sauce. Fish sauce. Oyster sauce. Cocktail sauce. Horseradish that you find on the shelf. Regular ketchup and mustard. Meat flavorings and tenderizers. Bouillon cubes. Hot sauce. Pre-made or packaged marinades. Pre-made or packaged taco seasonings. Relishes. Regular salad dressings. Salsa. Other foods Salted popcorn and pretzels. Corn chips and puffs. Potato and tortilla chips. Canned or dried soups. Pizza. Frozen entrees and pot pies. The items listed above may not be all the foods and drinks you should avoid. Talk to a dietitian to learn more. This information is not intended to replace advice given to you by your health care provider. Make sure you discuss any questions you have with your health care provider. Document Revised: 08/29/2022 Document Reviewed: 08/29/2022 Elsevier Patient Education  2024 [employer]   Heart-Healthy Eating Plan Many factors influence your heart health, including eating and exercise habits. Heart health is also called coronary health. Coronary risk increases with abnormal blood fat (lipid) levels. A heart-healthy eating plan includes limiting unhealthy fats, increasing healthy fats, limiting salt (sodium) intake, and making other diet and lifestyle changes. What is my plan? Your health care provider may recommend that: You limit your fat intake to _________% or less of your total calories each day. You limit your saturated fat intake to _________% or less of your total calories each day. You limit the amount of cholesterol  in your diet to less than _________ mg per day. You limit the amount of sodium in your diet to less than _________ mg per day. What are tips for following this plan? Cooking Cook foods using methods other than frying.  Baking, boiling, grilling, and broiling are all good options. Other ways to reduce fat include: Removing the skin from poultry. Removing all visible fats from meats. Steaming vegetables in water or broth. Meal planning  At meals, imagine dividing your plate into fourths: Fill one-half of your plate with vegetables and green salads. Fill one-fourth of your plate with whole grains. Fill one-fourth of your plate with lean protein foods. Eat 2-4 cups of vegetables per day. One cup of vegetables equals 1 cup (91 g) broccoli or cauliflower florets, 2 medium carrots, 1 large bell pepper, 1 large sweet potato, 1 large tomato, 1 medium white potato, 2 cups (150 g) raw leafy greens. Eat 1-2 cups of fruit per day. One cup of fruit equals 1 small apple, 1 large banana, 1 cup (237 g) mixed fruit, 1 large orange,  cup (82 g) dried fruit, 1 cup (240 mL) 100% fruit juice. Eat more foods that contain soluble fiber. Examples include apples, broccoli, carrots, beans, peas, and barley. Aim to get 25-30 g of fiber per day. Increase your consumption of legumes, nuts, and seeds to 4-5 servings per week. One serving of dried beans or legumes equals  cup (90 g) cooked, 1 serving of nuts is  oz (12 almonds, 24 pistachios, or 7 walnut halves), and 1 serving of seeds equals  oz (8 g). Fats Choose healthy fats more often. Choose monounsaturated and polyunsaturated fats, such as olive and canola oils, avocado oil, flaxseeds, walnuts, almonds, and seeds. Eat more omega-3 fats. Choose salmon, mackerel, sardines, tuna, flaxseed oil, and ground flaxseeds. Aim to eat fish at least 2 times each week. Check food labels carefully to identify foods with trans fats or high amounts of saturated fat. Limit saturated fats. These are found in animal products, such as meats, butter, and cream. Plant sources of saturated fats include palm oil, palm kernel oil, and coconut oil. Avoid foods with partially hydrogenated oils in them. These  contain trans fats. Examples are stick margarine, some tub margarines, cookies, crackers, and other baked goods. Avoid fried foods. General information Eat more home-cooked food and less restaurant, buffet, and fast food. Limit or avoid alcohol. Limit foods that are high in added sugar and simple starches such as foods made using white refined flour (white breads, pastries, sweets). Lose weight if you are overweight. Losing just 5-10% of your body weight can help your overall health and prevent diseases such as diabetes and heart disease. Monitor your sodium intake, especially if you have high blood pressure. Talk with your health care provider about your sodium intake. Try to incorporate more vegetarian meals weekly. What foods should I eat? Fruits All fresh, canned (in natural juice), or frozen fruits. Vegetables Fresh or frozen vegetables (raw, steamed, roasted, or grilled). Green salads. Grains Most grains. Choose whole wheat and whole grains most of the time. Rice and pasta, including brown rice and pastas made with whole wheat. Meats and other proteins Lean, well-trimmed beef, veal, pork, and lamb. Chicken and [place] without skin. All fish and shellfish. Wild duck, rabbit, pheasant, and venison. Egg whites or low-cholesterol egg substitutes. Dried beans, peas, lentils, and tofu. Seeds and most nuts. Dairy Low-fat or nonfat cheeses, including ricotta and mozzarella. Skim or 1% milk (liquid, powdered, or  evaporated). Buttermilk made with low-fat milk. Nonfat or low-fat yogurt. Fats and oils Non-hydrogenated (trans-free) margarines. Vegetable oils, including soybean, sesame, sunflower, olive, avocado, peanut, safflower, corn, canola, and cottonseed. Salad dressings or mayonnaise made with a vegetable oil. Beverages Water (mineral or sparkling). Coffee and tea. Unsweetened ice tea. Diet beverages. Sweets and desserts Sherbet, gelatin, and fruit ice. Small amounts of dark chocolate. Limit  all sweets and desserts. Seasonings and condiments All seasonings and condiments. The items listed above may not be a complete list of foods and beverages you can eat. Contact a dietitian for more options. What foods should I avoid? Fruits Canned fruit in heavy syrup. Fruit in cream or butter sauce. Fried fruit. Limit coconut. Vegetables Vegetables cooked in cheese, cream, or butter sauce. Fried vegetables. Grains Breads made with saturated or trans fats, oils, or whole milk. Croissants. Sweet rolls. Donuts. High-fat crackers, such as cheese crackers and chips. Meats and other proteins Fatty meats, such as hot dogs, ribs, sausage, bacon, rib-eye roast or steak. High-fat deli meats, such as salami and bologna. Caviar. Domestic duck and goose. Organ meats, such as liver. Dairy Cream, sour cream, cream cheese, and creamed cottage cheese. Whole-milk cheeses. Whole or 2% milk (liquid, evaporated, or condensed). Whole buttermilk. Cream sauce or high-fat cheese sauce. Whole-milk yogurt. Fats and oils Meat fat, or shortening. Cocoa butter, hydrogenated oils, palm oil, coconut oil, palm kernel oil. Solid fats and shortenings, including bacon fat, salt pork, lard, and butter. Nondairy cream substitutes. Salad dressings with cheese or sour cream. Beverages Regular sodas and any drinks with added sugar. Sweets and desserts Frosting. Pudding. Cookies. Cakes. Pies. Milk chocolate or white chocolate. Buttered syrups. Full-fat ice cream or ice cream drinks. The items listed above may not be a complete list of foods and beverages to avoid. Contact a dietitian for more information. Summary Heart-healthy meal planning includes limiting unhealthy fats, increasing healthy fats, limiting salt (sodium) intake and making other diet and lifestyle changes. Lose weight if you are overweight. Losing just 5-10% of your body weight can help your overall health and prevent diseases such as diabetes and heart  disease. Focus on eating a balance of foods, including fruits and vegetables, low-fat or nonfat dairy, lean protein, nuts and legumes, whole grains, and heart-healthy oils and fats. This information is not intended to replace advice given to you by your health care provider. Make sure you discuss any questions you have with your health care provider. Document Revised: 09/17/2021 Document Reviewed: 09/17/2021 Elsevier Patient Education  2024 [employer].

## 2023-03-03 ENCOUNTER — Telehealth: Payer: Self-pay | Admitting: Family Medicine

## 2023-03-03 NOTE — Telephone Encounter (Signed)
Wants to know if you know of a good cardiologist since the one he was seeing is leaving the practice.  Cna you please review and advise.  Thanks.  Dm/cma

## 2023-03-03 NOTE — Telephone Encounter (Signed)
Pt would like for you to call him at your earliest convenience

## 2023-03-04 ENCOUNTER — Other Ambulatory Visit: Payer: Self-pay | Admitting: Surgery

## 2023-03-04 DIAGNOSIS — Z951 Presence of aortocoronary bypass graft: Secondary | ICD-10-CM

## 2023-03-04 NOTE — Telephone Encounter (Signed)
Patient notified VIA phone. Dm/cma  

## 2023-03-05 ENCOUNTER — Ambulatory Visit: Payer: Medicare Other | Admitting: Surgery

## 2023-03-05 ENCOUNTER — Encounter: Payer: Self-pay | Admitting: Surgery

## 2023-03-05 ENCOUNTER — Ambulatory Visit
Admission: RE | Admit: 2023-03-05 | Discharge: 2023-03-05 | Disposition: A | Discharge status: Home / Self Care | Payer: Medicare Other | Source: Ambulatory Visit | Attending: Surgery | Admitting: Surgery

## 2023-03-05 VITALS — BP 121/68 | HR 83 | Resp 18 | Ht 71.0 in | Wt 292.0 lb

## 2023-03-05 DIAGNOSIS — Z951 Presence of aortocoronary bypass graft: Secondary | ICD-10-CM

## 2023-03-05 NOTE — Progress Notes (Signed)
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  Unfortunately he had an early postoperative sternal disruption due to heavy coughing on postoperative day 3.  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  He was taken back to the operating room by plastic surgery for debridement and had wound VAC placement.  He subsequently underwent reconstruction with a right pectoralis major flap by Dr. Ulice Bold on 01/31/2023.  He did well and was transferred to inpatient rehab.  He continued to progress and is now at home with his significant other.  He has been walking daily without chest pain or shortness of breath.  All of his drains have been removed.  He has been watching his diet closely and has lost over 20 pounds.  Current Outpatient Medications  Medication Sig Dispense Refill   acetaminophen (TYLENOL) 325 MG tablet Take 1-2 tablets (325-650 mg total) by mouth every 4 (four) hours as needed for mild pain.     albuterol (VENTOLIN HFA) 108 (90 Base) MCG/ACT inhaler Inhale 2 puffs into the lungs every 6 (six) hours as needed for wheezing or shortness of breath. 6.7 g 0   amiodarone (PACERONE) 200 MG tablet Take 1 tablet (200 mg total) by mouth daily. 30 tablet 0   APPLE CIDER VINEGAR PO Take 15 mLs by mouth every Monday, Wednesday, and Friday.     Ascorbic Acid (VITAMIN C) 1000 MG tablet Take 3,000 mg by mouth daily.     aspirin EC 325 MG tablet Take 1 tablet (325 mg total) by mouth daily. 30 tablet 0   calcium elemental as carbonate (TUMS ULTRA 1000) 400 MG chewable tablet Chew 2,000 mg by mouth 3 (three) times daily as needed for heartburn.     Cholecalciferol (DIALYVITE VITAMIN D 5000) 125 MCG (5000 UT) capsule Take 20,000 Units by mouth daily.     Coenzyme Q10  (COQ-10) 100 MG CAPS Take 100 mg by mouth daily.     docusate sodium (COLACE) 100 MG capsule Take 2 capsules (200 mg total) by mouth daily. 30 capsule 0   Flaxseed, Linseed, (FLAX SEED OIL) 1000 MG CAPS Take 1,000 mg by mouth daily.     folic acid (FOLVITE) 800 MCG tablet Take 800 mcg by mouth daily.     furosemide (LASIX) 40 MG tablet Take 1 tablet (40 mg total) by mouth every other day. 45 tablet 3   gabapentin (NEURONTIN) 100 MG capsule Take 2 capsules (200 mg total) by mouth 2 (two) times daily. 120 capsule 0   lamoTRIgine (LAMICTAL) 100 MG tablet Take 100 mg by mouth daily.     Liniments (BLUE-EMU SUPER STRENGTH EX) Apply 1 Application topically daily as needed (pain).     Metoprolol Tartrate 37.5 MG TABS Take 1 tablet (37.5 mg total) by mouth 2 (two) times daily. 60 tablet 0   Multiple Vitamins-Minerals (MULTIVITAMIN WITH MINERALS) tablet Take 1 tablet by mouth daily.     Omega 3 1000 MG CAPS Take 1,000 mg by mouth daily.     OVER THE COUNTER MEDICATION Nano CBD OIL: Pt takes 20 drops in the morning and 20 drops in the evening.     sildenafil (VIAGRA) 100 MG tablet Take 100 mg by mouth daily as needed  for erectile dysfunction.     traMADol (ULTRAM) 50 MG tablet 1 po TID x 3d, BID x 3 d then daily x 1 d 30 tablet 0   zinc gluconate 50 MG tablet Take 50 mg by mouth daily.     No current facility-administered medications for this visit.     Physical Exam: BP 121/68 (BP Location: Left Arm, Patient Position: Sitting)   Pulse 83   Resp 18   Ht 5\' 11"  (1.803 m)   Wt 292 lb (132.5 kg)   SpO2 98% Comment: RA  BMI 40.73 kg/m  He looks well. Cardiac exam shows a regular rate and rhythm with normal heart sounds. Lungs are clear. His chest wall feels stable.  There is no swelling or tenderness.  He has dressings over his incisions but I saw the recent photographs in the chart and they look like they are well-healed.  Diagnostic Tests:  Narrative & Impression  CLINICAL DATA:  cabg    EXAM: CHEST - 2 VIEW   COMPARISON:  CXR 02/04/23   FINDINGS: Status post median sternotomy and CABG. Unchanged small left pleural effusion. Unchanged cardiac and mediastinal contours. No pneumothorax. There is improved aeration of the left lung base with a persistent linear airspace opacities, favored to represent atelectasis. Unchanged prominent bilateral interstitial opacities, which most likely represent pulmonary venous congestion. No radiographically apparent new displaced rib fracture. Visualized upper abdomen is unremarkable.   IMPRESSION: 1. Unchanged small left pleural effusion. 2. Improved aeration of the left lung base with persistent linear airspace opacities, favored to represent atelectasis. 3. Unchanged prominent bilateral interstitial opacities, which most likely represent pulmonary venous congestion.     Electronically Signed   By: Lorenza Cambridge M.D.   On: 03/05/2023 08:22      Impression:  Overall he is doing very well following his surgery.  I encouraged him to continue walking is much as possible.  He seems very motivated to modify his cardiac risk factors.  I told him he can return to driving a car but should refrain from lifting anything heavier than 10 pounds for at least 3 months following his reconstruction.  Plan: He will continue to follow-up with plastic surgery and cardiology.  I will see him back if the need arises.   [name], MD [facility] [phone]

## 2023-03-08 ENCOUNTER — Other Ambulatory Visit: Payer: Self-pay | Admitting: Physician Assistant

## 2023-03-10 ENCOUNTER — Ambulatory Visit (INDEPENDENT_AMBULATORY_CARE_PROVIDER_SITE_OTHER): Payer: Medicare Other

## 2023-03-10 VITALS — Ht 71.0 in | Wt 292.0 lb

## 2023-03-10 DIAGNOSIS — Z0000 Encounter for general adult medical examination without abnormal findings: Secondary | ICD-10-CM

## 2023-03-10 NOTE — Progress Notes (Addendum)
Subjective:   Bryan Brock is a 67 y.o. male who presents for Medicare Annual/Subsequent preventive examination.  Visit Complete: Virtual  I connected with  Bryan Brock on 03/10/23 by a audio enabled telemedicine application and verified that I am speaking with the correct person using two identifiers.  Patient Location: Home  Provider Location: Office/Clinic  I discussed the limitations of evaluation and management by telemedicine. The patient expressed understanding and agreed to proceed.  Per patient no change in vitals since last visit, unable to obtain new vitals due to telehealth visit   Patient Medicare AWV questionnaire was completed by the patient on 03/06/2023; I have confirmed that all information answered by patient is correct and no changes since this date.  Review of Systems     Cardiac Risk Factors include: advanced age (>32men, >74 women);dyslipidemia;hypertension;male gender;obesity (BMI >30kg/m2)     Objective:    Today's Vitals   03/10/23 1041 03/10/23 1044  Weight: 292 lb (132.5 kg)   Height: 5\' 11"  (1.803 m)   PainSc:  1    Body mass index is 40.73 kg/m.     03/10/2023   10:55 AM 02/04/2023    5:00 PM 01/31/2023    3:00 PM 01/29/2023    3:00 PM 01/05/2023    3:03 AM 12/31/2022   11:31 AM 11/21/2022    8:57 AM  Advanced Directives  Does Patient Have a Medical Advance Directive? Yes Yes Yes Yes Yes Yes Yes  Type of Estate agent of [place];Living will Healthcare Power of [employer] of [place];Living will Healthcare Power of [place];Living will Healthcare Power of [place];Living will Healthcare Power of [place];Living will Healthcare Power of [place];Living will  Does patient want to make changes to medical advance directive?  No - Guardian declined  No - Guardian declined No - Patient declined No - Guardian declined   Copy of Healthcare Power of Attorney in Chart? Yes - validated most recent copy scanned in chart  (See row information) Yes - validated most recent copy scanned in chart (See row information) Yes - validated most recent copy scanned in chart (See row information) No - copy requested  Yes - validated most recent copy scanned in chart (See row information)     Current Medications (verified) Outpatient Encounter Medications as of 03/10/2023  Medication Sig   acetaminophen (TYLENOL) 325 MG tablet Take 1-2 tablets (325-650 mg total) by mouth every 4 (four) hours as needed for mild pain.   albuterol (VENTOLIN HFA) 108 (90 Base) MCG/ACT inhaler Inhale 2 puffs into the lungs every 6 (six) hours as needed for wheezing or shortness of breath.   amiodarone (PACERONE) 200 MG tablet Take 1 tablet (200 mg total) by mouth daily.   APPLE CIDER VINEGAR PO Take 15 mLs by mouth every Monday, Wednesday, and Friday.   Ascorbic Acid (VITAMIN C) 1000 MG tablet Take 3,000 mg by mouth daily.   aspirin EC 325 MG tablet Take 1 tablet (325 mg total) by mouth daily.   calcium elemental as carbonate (TUMS ULTRA 1000) 400 MG chewable tablet Chew 2,000 mg by mouth 3 (three) times daily as needed for heartburn.   Cholecalciferol (DIALYVITE VITAMIN D 5000) 125 MCG (5000 UT) capsule Take 20,000 Units by mouth daily.   Coenzyme Q10 (COQ-10) 100 MG CAPS Take 100 mg by mouth daily.   docusate sodium (COLACE) 100 MG capsule Take 2 capsules (200 mg total) by mouth daily.   Flaxseed, Linseed, (FLAX SEED OIL) 1000 MG  CAPS Take 1,000 mg by mouth daily.   folic acid (FOLVITE) 800 MCG tablet Take 800 mcg by mouth daily.   furosemide (LASIX) 40 MG tablet Take 1 tablet (40 mg total) by mouth every other day.   gabapentin (NEURONTIN) 100 MG capsule Take 2 capsules (200 mg total) by mouth 2 (two) times daily.   lamoTRIgine (LAMICTAL) 100 MG tablet Take 100 mg by mouth daily.   Liniments (BLUE-EMU SUPER STRENGTH EX) Apply 1 Application topically daily as needed (pain).   Metoprolol Tartrate 37.5 MG TABS Take 1 tablet (37.5 mg total) by  mouth 2 (two) times daily.   Multiple Vitamins-Minerals (MULTIVITAMIN WITH MINERALS) tablet Take 1 tablet by mouth daily.   Omega 3 1000 MG CAPS Take 1,000 mg by mouth daily.   OVER THE COUNTER MEDICATION Nano CBD OIL: Pt takes 20 drops in the morning and 20 drops in the evening.   sildenafil (VIAGRA) 100 MG tablet Take 100 mg by mouth daily as needed for erectile dysfunction.   traMADol (ULTRAM) 50 MG tablet 1 po TID x 3d, BID x 3 d then daily x 1 d   zinc gluconate 50 MG tablet Take 50 mg by mouth daily.   No facility-administered encounter medications on file as of 03/10/2023.    Allergies (verified) Latex and Lisinopril   History: Past Medical History:  Diagnosis Date   Acne    ADHD (attention deficit hyperactivity disorder)    Arthritis    Asthma    Bipolar 1 disorder (HCC)    Cancer (HCC)    basal cell cancer removed from back   Cataract    Coronary artery disease    Depression    Dyspnea    Dysrhythmia    PVCs   GERD (gastroesophageal reflux disease)    Heart murmur    Hepatitis    remote hx Hepatitis A (caused by food contaminant in childhood)   History of hiatal hernia    Hyperlipidemia    Hypertension    Mitral valve prolapse    Panic attacks    Peripheral vascular disease (HCC)    PFO (patent foramen ovale)    ? small PFO per echo   PONV (postoperative nausea and vomiting)    Pre-diabetes    Sleep apnea    Past Surgical History:  Procedure Laterality Date   APPLICATION OF WOUND VAC N/A 01/20/2023   Procedure: APPLICATION OF WOUND VAC;  Surgeon: Lovett Sox, MD;  Location: [facility] OR;  Service: Thoracic;  Laterality: N/A;   APPLICATION OF WOUND VAC N/A 01/23/2023   Procedure: APPLICATION OF WOUND VAC;  Surgeon: Peggye Form, DO;  Location: [facility];  Service: Plastics;  Laterality: N/A;   APPLICATION OF WOUND VAC N/A 01/29/2023   Procedure: APPLICATION OF WOUND VAC;  Surgeon: Peggye Form, DO;  Location: [facility];  Service: Plastics;  Laterality:  N/A;   CORONARY ARTERY BYPASS GRAFT N/A 01/02/2023   Procedure: CORONARY ARTERY BYPASS GRAFTING (CABG) TIMES FOUR USING THE LEFT INTERNAL MAMMARY ARTERY (LIMA) AND BILATERAL GREATER SAPHENOUS VEIN ARTERIES;  Surgeon: [name], MD;  Location: [facility];  Service: Open Heart Surgery;  Laterality: N/A;  Open median sternotomy   CORONARY PRESSURE/FFR STUDY N/A 11/21/2022   Procedure: INTRAVASCULAR PRESSURE WIRE/FFR STUDY;  Surgeon: Orbie Pyo, MD;  Location: [facility];  Service: Cardiovascular;  Laterality: N/A;   HYDROCELE EXCISION Bilateral 03/22/2022   Procedure: HYDROCELECTOMY ADULT;  Surgeon: Noel Christmas, MD;  Location: [facility];  Service: Urology;  Laterality: Bilateral;  2 HRS   INCISION AND DRAINAGE OF WOUND N/A 01/29/2023   Procedure: Sternal wound irrigation and debridement, placement of myriad and wound VAC change;  Surgeon: Peggye Form, DO;  Location: [facility];  Service: Plastics;  Laterality: N/A;   INCISION AND DRAINAGE OF WOUND N/A 01/31/2023   Procedure: IRRIGATION AND DEBRIDEMENT OF STERNAL WOUND;  Surgeon: Peggye Form, DO;  Location: [facility];  Service: Plastics;  Laterality: N/A;   LEFT HEART CATH AND CORONARY ANGIOGRAPHY N/A 11/21/2022   Procedure: LEFT HEART CATH AND CORONARY ANGIOGRAPHY;  Surgeon: Orbie Pyo, MD;  [facility];  Service: Cardiovascular;  Laterality: N/A;   MOHS SURGERY  1990   PECTORALIS FLAP Right 01/31/2023   Procedure: RIGHT PECTORALIS FLAP;  Surgeon: Peggye Form, DO;  Location: [facility];  Service: Plastics;  Laterality: Right;   STERNAL INCISION RECLOSURE N/A 01/07/2023   Procedure: STERNAL REWIRING;  Surgeon: [name], MD;  Location: [facility];  Service: Thoracic;  Laterality: N/A;   STERNAL WOUND DEBRIDEMENT N/A 01/20/2023   Procedure: DRAIN STERNAL INFECTION;  Surgeon: Lovett Sox, MD;  Location: [facility] OR;  Service: Thoracic;  Laterality: N/A;   STERNAL WOUND DEBRIDEMENT N/A 01/23/2023   Procedure:  Excision of sternal wound with Myriad;  Surgeon: Peggye Form, DO;  Location: [facility];  Service: Plastics;  Laterality: N/A;   STRABISMUS SURGERY Left    TEE WITHOUT CARDIOVERSION N/A 01/02/2023   Procedure: TRANSESOPHAGEAL ECHOCARDIOGRAM;  Surgeon: [name], MD;  Location: [facility];  Service: Open Heart Surgery;  Laterality: N/A;   TONSILLECTOMY AND ADENOIDECTOMY     WISDOM TOOTH EXTRACTION     Family History  Problem Relation Age of Onset   Heart disease Mother    Diabetes Mother    Obesity Mother    Hyperlipidemia Mother    Cancer Father        skin cancer   Heart disease Father    Hyperlipidemia Father    Colon cancer Neg Hx    Esophageal cancer Neg Hx    Rectal cancer Neg Hx    Stomach cancer Neg Hx    Social History   Socioeconomic History   Marital status: Media planner    Spouse name: Not on file   Number of children: 2   Years of education: Not on file   Highest education level: Bachelor's degree (e.g., BA, AB, BS)  Occupational History   Occupation: Horticulturist, commercial    Comment: Part-Time  Tobacco Use   Smoking status: Never   Smokeless tobacco: Current    Types: Snuff  Vaping Use   Vaping status: Never Used  Substance and Sexual Activity   Alcohol use: Yes    Alcohol/week: 1.0 - 2.0 standard drink of alcohol    Types: 1 - 2 Standard drinks or equivalent per week   Drug use: Never   Sexual activity: Yes  Other Topics Concern   Not on file  Social History Narrative   Not on file   Social Determinants of Health   Financial Resource Strain: Low Risk  (03/06/2023)   Overall Financial Resource Strain (CARDIA)    Difficulty of Paying Living Expenses: Not hard at all  Food Insecurity: No Food Insecurity (03/06/2023)   Hunger Vital Sign    Worried About Running Out of Food in the Last Year: Never true    Ran Out of Food in the Last Year: Never true  Transportation  Needs: No Transportation Needs (03/06/2023)   PRAPARE - Scientist, research (physical sciences) (Medical): No    Lack of Transportation (Non-Medical): No  Physical Activity: Insufficiently Active (03/06/2023)   Exercise Vital Sign    Days of Exercise per Week: 2 days    Minutes of Exercise per Session: 10 min  Stress: No Stress Concern Present (03/06/2023)   [employer] of Occupational Health - Occupational Stress Questionnaire    Feeling of Stress : Not at all  Social Connections: Socially Integrated (03/06/2023)   Social Connection and Isolation Panel [NHANES]    Frequency of Communication with Friends and Family: More than three times a week    Frequency of Social Gatherings with Friends and Family: Once a week    Attends Religious Services: More than 4 times per year    Active Member of [employer] or Organizations: Yes    Attends Engineer, structural: More than 4 times per year    Marital Status: Living with partner    Tobacco Counseling Ready to quit: Not Answered Counseling given: Not Answered   Clinical Intake:  Pre-visit preparation completed: Yes  Pain : 0-10 Pain Score: 1  Pain Type: Acute pain Pain Location: Chest Pain Descriptors / Indicators: Sore Pain Onset: More than a month ago Pain Frequency: Intermittent     Nutritional Status: BMI > 30  Obese Nutritional Risks: None Diabetes: No  How often do you need to have someone help you when you read instructions, pamphlets, or other written materials from your doctor or pharmacy?: 1 - Never  Interpreter Needed?: No  Information entered by :: NAllen LPN   Activities of Daily Living    03/06/2023   10:31 AM 02/04/2023    4:27 PM  In your present state of health, do you have any difficulty performing the following activities:  Hearing? 0 0  Vision? 0 0  Difficulty concentrating or making decisions? 0 0  Walking or climbing stairs? 0 1  Dressing or bathing? 0 1  Doing errands, shopping? 0 0  Preparing Food and eating ? N   Using the Toilet? N   In the past six months, have  you accidently leaked urine? N   Do you have problems with loss of bowel control? N   Managing your Medications? N   Managing your Finances? N   Housekeeping or managing your Housekeeping? N     Patient Care Team: Loyola Mast, MD as PCP - General (Family Medicine) Kathleene Hazel, MD as PCP - Cardiology (Cardiology) Grace Blight, MD as Psychiatrist (Psychiatry) Quintella Reichert, MD as Consulting Physician (Sleep Medicine) Meriam Sprague, MD as Consulting Physician (Cardiology)  Indicate any recent Medical Services you may have received from other than [facility] providers in the past year (date may be approximate).     Assessment:   This is a routine wellness examination for Ward.  Hearing/Vision screen Hearing Screening - Comments:: Denies hearing issues Vision Screening - Comments:: Regular eye exams, [facility]  Dietary issues and exercise activities discussed:     Goals Addressed             This Visit's Progress    Patient Stated       03/10/2023, wants to lose weight and recover from surgery       Depression Screen    03/10/2023   10:57 AM 10/31/2022    9:55 AM 03/05/2022   11:40 AM 03/05/2022   11:37 AM 10/17/2021  4:00 PM 02/20/2021    2:46 PM 05/11/2020    3:14 PM  PHQ 2/9 Scores  PHQ - 2 Score 0 0 0 0 0 0 0  PHQ- 9 Score 0      0    Fall Risk    03/06/2023   10:31 AM 10/31/2022    9:54 AM 03/05/2022   11:40 AM 03/05/2022   10:34 AM 01/28/2022    9:37 AM  Fall Risk   Falls in the past year? 0 1 0 0 0  Number falls in past yr: 0 0 0 0 0  Injury with Fall? 0 0 0 0 0  Risk for fall due to : Medication side effect History of fall(s)     Follow up Falls prevention discussed;Falls evaluation completed Falls evaluation completed Falls evaluation completed;Education provided      MEDICARE RISK AT HOME:   TIMED UP AND GO:  Was the test performed?  No    Cognitive Function:        03/10/2023   10:59 AM  6CIT Screen  What Year? 0  points  What month? 0 points  What time? 0 points  Count back from 20 0 points  Months in reverse 0 points  Repeat phrase 0 points  Total Score 0 points    Immunizations Immunization History  Administered Date(s) Administered   PFIZER(Purple Top)SARS-COV-2 Vaccination 04/06/2020   PNEUMOCOCCAL CONJUGATE-20 10/29/2021   Tdap 10/29/2021    TDAP status: Up to date  Flu Vaccine status: Up to date  Pneumococcal vaccine status: Up to date  Covid-19 vaccine status: Information provided on how to obtain vaccines.   Qualifies for Shingles Vaccine? Yes   Zostavax completed No   Shingrix Completed?: No.    Education has been provided regarding the importance of this vaccine. Patient has been advised to call insurance company to determine out of pocket expense if they have not yet received this vaccine. Advised may also receive vaccine at local pharmacy or Health Dept. Verbalized acceptance and understanding.  Screening Tests Health Maintenance  Topic Date Due   Hepatitis C Screening  Never done   Zoster Vaccines- Shingrix (1 of 2) Never done   Diabetic kidney evaluation - Urine ACR  02/18/2028 (Originally 01/17/1974)   INFLUENZA VACCINE  03/27/2023   Diabetic kidney evaluation - eGFR measurement  02/18/2024   Medicare Annual Wellness (AWV)  03/09/2024   Colonoscopy  06/04/2027   DTaP/Tdap/Td (2 - Td or Tdap) 10/30/2031   Pneumonia Vaccine 41+ Years old  Completed   HPV VACCINES  Aged Out   COVID-19 Vaccine  Discontinued    Health Maintenance  Health Maintenance Due  Topic Date Due   Hepatitis C Screening  Never done   Zoster Vaccines- Shingrix (1 of 2) Never done    Colorectal cancer screening: Type of screening: Colonoscopy. Completed 06/03/2022. Repeat every 3 years  Lung Cancer Screening: (Low Dose CT Chest recommended if Age 67-80 years, 20 pack-year currently smoking OR have quit w/in 15years.) does not qualify.   Lung Cancer Screening Referral: no  Additional  Screening:  Hepatitis C Screening: does qualify;   Vision Screening: Recommended annual ophthalmology exams for early detection of glaucoma and other disorders of the eye. Is the patient up to date with their annual eye exam?  Yes  Who is the provider or what is the name of the office in which the patient attends annual eye exams? [facility] If pt is not established with a provider, would  they like to be referred to a provider to establish care? No .   Dental Screening: Recommended annual dental exams for proper oral hygiene  Diabetic Foot Exam: n/a  Community Resource Referral / Chronic Care Management: CRR required this visit?  No   CCM required this visit?  No     Plan:     I have personally reviewed and noted the following in the patient's chart:   Medical and social history Use of alcohol, tobacco or illicit drugs  Current medications and supplements including opioid prescriptions. Patient is not currently taking opioid prescriptions. Functional ability and status Nutritional status Physical activity Advanced directives List of other physicians Hospitalizations, surgeries, and ER visits in previous 12 months Vitals Screenings to include cognitive, depression, and falls Referrals and appointments  In addition, I have reviewed and discussed with patient certain preventive protocols, quality metrics, and best practice recommendations. A written personalized care plan for preventive services as well as general preventive health recommendations were provided to patient.     Barb Merino, LPN   12/02/8117   After Visit Summary: (MyChart) Due to this being a telephonic visit, the after visit summary with patients personalized plan was offered to patient via MyChart   Nurse Notes: none

## 2023-03-10 NOTE — Patient Instructions (Signed)
Bryan Brock , Thank you for taking time to come for your Medicare Wellness Visit. I appreciate your ongoing commitment to your health goals. Please review the following plan we discussed and let me know if I can assist you in the future.   These are the goals we discussed:  Goals      Patient Stated     03/10/2023, wants to lose weight and recover from surgery     Weight (lb) < 200 lb (90.7 kg)     Loose 45 lbs        This is a list of the screening recommended for you and due dates:  Health Maintenance  Topic Date Due   Hepatitis C Screening  Never done   Zoster (Shingles) Vaccine (1 of 2) Never done   Yearly kidney health urinalysis for diabetes  02/18/2028*   Flu Shot  03/27/2023   Yearly kidney function blood test for diabetes  02/18/2024   Medicare Annual Wellness Visit  03/09/2024   Colon Cancer Screening  06/04/2027   DTaP/Tdap/Td vaccine (2 - Td or Tdap) 10/30/2031   Pneumonia Vaccine  Completed   HPV Vaccine  Aged Out   COVID-19 Vaccine  Discontinued  *Topic was postponed. The date shown is not the original due date.    Advanced directives: copy in chart  Conditions/risks identified: none  Next appointment: Follow up in one year for your annual wellness visit.   Preventive Care 68 Years and Older, Male  Preventive care refers to lifestyle choices and visits with your health care provider that can promote health and wellness. What does preventive care include? A yearly physical exam. This is also called an annual well check. Dental exams once or twice a year. Routine eye exams. Ask your health care provider how often you should have your eyes checked. Personal lifestyle choices, including: Daily care of your teeth and gums. Regular physical activity. Eating a healthy diet. Avoiding tobacco and drug use. Limiting alcohol use. Practicing safe sex. Taking low doses of aspirin every day. Taking vitamin and mineral supplements as recommended by your health care  provider. What happens during an annual well check? The services and screenings done by your health care provider during your annual well check will depend on your age, overall health, lifestyle risk factors, and family history of disease. Counseling  Your health care provider may ask you questions about your: Alcohol use. Tobacco use. Drug use. Emotional well-being. Home and relationship well-being. Sexual activity. Eating habits. History of falls. Memory and ability to understand (cognition). Work and work Astronomer. Screening  You may have the following tests or measurements: Height, weight, and BMI. Blood pressure. Lipid and cholesterol levels. These may be checked every 5 years, or more frequently if you are over 12 years old. Skin check. Lung cancer screening. You may have this screening every year starting at age 21 if you have a 30-pack-year history of smoking and currently smoke or have quit within the past 15 years. Fecal occult blood test (FOBT) of the stool. You may have this test every year starting at age 64. Flexible sigmoidoscopy or colonoscopy. You may have a sigmoidoscopy every 5 years or a colonoscopy every 10 years starting at age 23. Prostate cancer screening. Recommendations will vary depending on your family history and other risks. Hepatitis C blood test. Hepatitis B blood test. Sexually transmitted disease (STD) testing. Diabetes screening. This is done by checking your blood sugar (glucose) after you have not eaten for a while (  fasting). You may have this done every 1-3 years. Abdominal aortic aneurysm (AAA) screening. You may need this if you are a current or former smoker. Osteoporosis. You may be screened starting at age 56 if you are at high risk. Talk with your health care provider about your test results, treatment options, and if necessary, the need for more tests. Vaccines  Your health care provider may recommend certain vaccines, such  as: Influenza vaccine. This is recommended every year. Tetanus, diphtheria, and acellular pertussis (Tdap, Td) vaccine. You may need a Td booster every 10 years. Zoster vaccine. You may need this after age 26. Pneumococcal 13-valent conjugate (PCV13) vaccine. One dose is recommended after age 97. Pneumococcal polysaccharide (PPSV23) vaccine. One dose is recommended after age 46. Talk to your health care provider about which screenings and vaccines you need and how often you need them. This information is not intended to replace advice given to you by your health care provider. Make sure you discuss any questions you have with your health care provider. Document Released: 09/08/2015 Document Revised: 05/01/2016 Document Reviewed: 06/13/2015 Elsevier Interactive Patient Education  2017 [employer].  Fall Prevention in the Home Falls can cause injuries. They can happen to people of all ages. There are many things you can do to make your home safe and to help prevent falls. What can I do on the outside of my home? Regularly fix the edges of walkways and driveways and fix any cracks. Remove anything that might make you trip as you walk through a door, such as a raised step or threshold. Trim any bushes or trees on the path to your home. Use bright outdoor lighting. Clear any walking paths of anything that might make someone trip, such as rocks or tools. Regularly check to see if handrails are loose or broken. Make sure that both sides of any steps have handrails. Any raised decks and porches should have guardrails on the edges. Have any leaves, snow, or ice cleared regularly. Use sand or salt on walking paths during winter. Clean up any spills in your garage right away. This includes oil or grease spills. What can I do in the bathroom? Use night lights. Install grab bars by the toilet and in the tub and shower. Do not use towel bars as grab bars. Use non-skid mats or decals in the tub or  shower. If you need to sit down in the shower, use a plastic, non-slip stool. Keep the floor dry. Clean up any water that spills on the floor as soon as it happens. Remove soap buildup in the tub or shower regularly. Attach bath mats securely with double-sided non-slip rug tape. Do not have throw rugs and other things on the floor that can make you trip. What can I do in the bedroom? Use night lights. Make sure that you have a light by your bed that is easy to reach. Do not use any sheets or blankets that are too big for your bed. They should not hang down onto the floor. Have a firm chair that has side arms. You can use this for support while you get dressed. Do not have throw rugs and other things on the floor that can make you trip. What can I do in the kitchen? Clean up any spills right away. Avoid walking on wet floors. Keep items that you use a lot in easy-to-reach places. If you need to reach something above you, use a strong step stool that has a grab bar.  Keep electrical cords out of the way. Do not use floor polish or wax that makes floors slippery. If you must use wax, use non-skid floor wax. Do not have throw rugs and other things on the floor that can make you trip. What can I do with my stairs? Do not leave any items on the stairs. Make sure that there are handrails on both sides of the stairs and use them. Fix handrails that are broken or loose. Make sure that handrails are as long as the stairways. Check any carpeting to make sure that it is firmly attached to the stairs. Fix any carpet that is loose or worn. Avoid having throw rugs at the top or bottom of the stairs. If you do have throw rugs, attach them to the floor with carpet tape. Make sure that you have a light switch at the top of the stairs and the bottom of the stairs. If you do not have them, ask someone to add them for you. What else can I do to help prevent falls? Wear shoes that: Do not have high heels. Have  rubber bottoms. Are comfortable and fit you well. Are closed at the toe. Do not wear sandals. If you use a stepladder: Make sure that it is fully opened. Do not climb a closed stepladder. Make sure that both sides of the stepladder are locked into place. Ask someone to hold it for you, if possible. Clearly mark and make sure that you can see: Any grab bars or handrails. First and last steps. Where the edge of each step is. Use tools that help you move around (mobility aids) if they are needed. These include: Canes. Walkers. Scooters. Crutches. Turn on the lights when you go into a dark area. Replace any light bulbs as soon as they burn out. Set up your furniture so you have a clear path. Avoid moving your furniture around. If any of your floors are uneven, fix them. If there are any pets around you, be aware of where they are. Review your medicines with your doctor. Some medicines can make you feel dizzy. This can increase your chance of falling. Ask your doctor what other things that you can do to help prevent falls. This information is not intended to replace advice given to you by your health care provider. Make sure you discuss any questions you have with your health care provider. Document Released: 06/08/2009 Document Revised: 01/18/2016 Document Reviewed: 09/16/2014 Elsevier Interactive Patient Education  2017 [employer].

## 2023-03-11 ENCOUNTER — Encounter: Payer: Self-pay | Admitting: Physician Assistant

## 2023-03-11 ENCOUNTER — Ambulatory Visit (INDEPENDENT_AMBULATORY_CARE_PROVIDER_SITE_OTHER): Payer: Medicare Other | Admitting: Physician Assistant

## 2023-03-11 VITALS — BP 153/80 | HR 83

## 2023-03-11 DIAGNOSIS — Z9889 Other specified postprocedural states: Secondary | ICD-10-CM

## 2023-03-11 DIAGNOSIS — T8132XD Disruption of internal operation (surgical) wound, not elsewhere classified, subsequent encounter: Secondary | ICD-10-CM

## 2023-03-11 LAB — ACID FAST CULTURE WITH REFLEXED SENSITIVITIES (MYCOBACTERIA): Acid Fast Culture: NEGATIVE

## 2023-03-11 NOTE — Progress Notes (Signed)
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 Ulice Bold who presents to clinic for postoperative follow-up.   He was last seen here in clinic on 02/25/2023.  At that time, he was doing well.  Applying Vaseline to his incisions throughout.  Drain output had slowed considerably and was removed without complication or difficulty.  Recommended Vashe wash to his incisions x 10 minutes each day followed by Vaseline.  Follow-up in 2 weeks, sooner if needed.  Today, patient is doing well.  He states that he has a little bit of "puffiness" over the inferior aspect of his chest incision.  However, he does not have any sensation in the area and does not describe any new pain or discomfort.  He has had some irritation related to the dressings that he has been applying every day.  He continues to apply Vaseline over all of his incisions including where his drain tube insertion sites were.  He has been focusing on maintaining cleanliness and sterility is much as possible as well as adhering to the sternal precautions given by CT surgery.  On exam, incision appears to be fully epithelialized and well-healed.  Excellent moisture.  Tape dermatitis along periphery and rectangular pattern consistent with the bandages.  No areas of fluctuance or asymmetric swelling.  No palpable subcutaneous fluid collections.  No concerning erythema or induration.  Drain tube insertion sites appear to be healing well.  Several scattered sutures and scabs are removed without complication or difficulty.  They were effectively absorbed already and simply lifted with pickups.  Recommend continued Vaseline to the incisions throughout.  He can apply Desitin ointment to the tape dermatitis along periphery of incisions.  Follow-up in 3 weeks.  Continued activity restrictions.  He will continue  to sponge bathe and keep the areas as clean as possible, particularly given his father's history.  Discussed what to watch out for and he understands to call the clinic should he develop any new or worsening symptoms.  Picture(s) obtained of the patient and placed in the chart were with the patient's or guardian's permission.

## 2023-03-12 ENCOUNTER — Telehealth: Payer: Self-pay | Admitting: *Deleted

## 2023-03-12 ENCOUNTER — Other Ambulatory Visit: Payer: Self-pay | Admitting: Nurse Practitioner

## 2023-03-12 MED ORDER — AMIODARONE HCL 200 MG PO TABS: 200.0000 mg | ORAL_TABLET | Freq: Every day | ORAL | 0 refills | Status: DC

## 2023-03-12 MED ORDER — AMLODIPINE BESYLATE 5 MG PO TABS: 5.0000 mg | ORAL_TABLET | Freq: Every day | ORAL | 3 refills | Status: DC

## 2023-03-12 MED ORDER — METOPROLOL TARTRATE 25 MG PO TABS: 37.5000 mg | ORAL_TABLET | Freq: Two times a day (BID) | ORAL | 3 refills | Status: DC

## 2023-03-12 MED ORDER — METOPROLOL TARTRATE 37.5 MG PO TABS: 37.5000 mg | ORAL_TABLET | Freq: Two times a day (BID) | ORAL | 3 refills | Status: DC

## 2023-03-12 NOTE — Addendum Note (Signed)
Addended by: Levi Aland on: 03/12/2023 04:35 PM   Modules accepted: Orders

## 2023-03-12 NOTE — Telephone Encounter (Signed)
Patient contacted the office inquiring when he can begin taking ASA 81mg  instead of 325mg . Advised that we leave medication management up to Cardiology. Advised patient to contact Cardiologist when to switch from 325mg  to 81mg . Patient verbalized understanding.

## 2023-03-18 ENCOUNTER — Ambulatory Visit: Payer: Medicare Other | Attending: Physician Assistant

## 2023-03-18 DIAGNOSIS — Z79899 Other long term (current) drug therapy: Secondary | ICD-10-CM

## 2023-03-18 DIAGNOSIS — I25810 Atherosclerosis of coronary artery bypass graft(s) without angina pectoris: Secondary | ICD-10-CM

## 2023-03-18 DIAGNOSIS — Z8679 Personal history of other diseases of the circulatory system: Secondary | ICD-10-CM

## 2023-03-18 DIAGNOSIS — R5383 Other fatigue: Secondary | ICD-10-CM

## 2023-03-18 DIAGNOSIS — I5030 Unspecified diastolic (congestive) heart failure: Secondary | ICD-10-CM

## 2023-03-18 DIAGNOSIS — I10 Essential (primary) hypertension: Secondary | ICD-10-CM

## 2023-03-18 DIAGNOSIS — G4733 Obstructive sleep apnea (adult) (pediatric): Secondary | ICD-10-CM

## 2023-03-18 DIAGNOSIS — E785 Hyperlipidemia, unspecified: Secondary | ICD-10-CM

## 2023-03-18 DIAGNOSIS — R0602 Shortness of breath: Secondary | ICD-10-CM

## 2023-03-18 DIAGNOSIS — R0609 Other forms of dyspnea: Secondary | ICD-10-CM

## 2023-03-19 ENCOUNTER — Telehealth: Payer: Self-pay | Admitting: Pharmacist

## 2023-03-19 DIAGNOSIS — E785 Hyperlipidemia, unspecified: Secondary | ICD-10-CM

## 2023-03-19 LAB — LIPID PANEL
Chol/HDL Ratio: 3.7 ratio (ref 0.0–5.0)
Cholesterol, Total: 166 mg/dL (ref 100–199)
HDL: 45 mg/dL (ref >39)
LDL Chol Calc (NIH): 94 mg/dL (ref 0–99)
Triglycerides: 155 mg/dL — ABNORMAL HIGH (ref 0–149)
VLDL Cholesterol Cal: 27 mg/dL (ref 5–40)

## 2023-03-19 LAB — HEPATIC FUNCTION PANEL
ALT: 25 IU/L (ref 0–44)
AST: 22 IU/L (ref 0–40)
Albumin: 4.3 g/dL (ref 3.9–4.9)
Alkaline Phosphatase: 141 IU/L — ABNORMAL HIGH (ref 44–121)
Bilirubin Total: 0.4 mg/dL (ref 0.0–1.2)
Bilirubin, Direct: 0.12 mg/dL (ref 0.00–0.40)
Total Protein: 6.7 g/dL (ref 6.0–8.5)

## 2023-03-19 NOTE — Telephone Encounter (Signed)
I spoke with patient. He has only had 1 dose of Leqvio. He has just resumed taking rosuvastatin 10mg  daily. Next leqvio injection is 8/5.  Will recheck labs 9/26.

## 2023-03-26 ENCOUNTER — Encounter (INDEPENDENT_AMBULATORY_CARE_PROVIDER_SITE_OTHER): Payer: Self-pay

## 2023-03-28 NOTE — Progress Notes (Unsigned)
Cardiology Office Note:    Date:  04/01/2023   ID:  Bryan Brock, DOB [birth year], MRN [id number]  PCP:  Loyola Mast, MD   [facility] Providers Cardiologist:  Verne Carrow, MD     Referring MD: Loyola Mast, MD   Chief Complaint: follow-up CAD  History of Present Illness:    Bryan Brock is a very pleasant loquacious 67 y.o. male with a hx of MVP, symptomatic PVCs, hypertension, hyperlipidemia, family hx CAD, and morbid obesity.    He was referred to cardiology for evaluation of MVP.  Diagnosed at age 51 to 67 years old.  Previously seen by Dr. Orvan Falconer.  Around that time he had sudden syncope episode.  Described prodrome of warm rashes, lightheadedness, and arrhythmias.  TTE at that time with mild MVP.  Holter per report with no significant arrhythmias.  Was then followed by Dr. Tresa Endo but has not been seen in several years.  Seen by Dr. Shari Prows on 11/08/21. BP elevated at PCP 180/100.  EKGs showed possible LAE and RVH. Due to significant hypertension, pedal edema, and abnormal EKG he was referred to cardiology to assess for heart failure prior to surgery. Seen in follow-up by PCP on 10/29/2021 BP was 162/94. Was started on 10-12.5 mg lisinopril-HCTZ daily. 5 episodes of palpitations or skipped beats.  Episodes lasting 30 minutes to several hours and may wake him at night, 2-4 times per month. Voiced concerns about his weight of 303 pounds in clinic. Wanted to pursue weight reduction program and exercise, however having shortness of breath and elevated heart rates with minimal exertion and limited by issues with right knee. Cardiac testing was ordered in order to clear patient for surgery. Sleep test revealed very mild OSA. Referred to Dr. Mayford Knife for sleep management. Nuclear stress test showed no evidence of ischemia or infarction. Echo revealed normal LVEF, G1DD, mild LVH, no rwma, elongated mitral valve leaflets with no obvious prolapse.  He was subsequently cleared for  surgery on 12/31/21.  Seen in clinic by me on 02/14/22 to complete surgical clearance for hydrocelectomy scheduled for late July. BP readings systolic average 086V, diastolic 80s-90s. No further palpitations. Continues to work part-time.  Activity is limited by right knee pain - bone on bone, will eventually need replacement.  Continues to have dyspnea with exertion, no chest pain. Feels that DOE is secondary to his weight. Was told CPAP not needed for mild OSA.   Seen in clinic by Dr. Shari Prows on 07/29/22 at which time he had gained 12-16 lbs. Palpitations occurring infrequently but lasting for hours at the time. Encouraged to get Yorkshire device. BP medication adjustments - stopped lisinopril-hctz and advised to take lisinopril 40 mg daily, hctz 25 mg daily, and increase amlodipine to 5 mg daily. Lengthy discussion about the importance of weight loss. Advised to follow-up in 3 months.  Seen in clinic by me on 10/28/22. Having cough, thought to be 2/2 lisinopril. BP has been well controlled. Infrequent palpitations, has Kardia monitor at home. Continuing to have dyspnea on exertion. 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 Feels very fatigued at the end of shift, like he cannot do anything else. Bilateral LE 1+ pitting edema. No chest pain, presyncope, syncope, orthopnea, and PND. Advised to stop lisinopril and start valsartan 320 mg to see if cough improves. Scheduled for coronary CTA to evaluate for ischemia which revealed coronary calcium score of 2487 (97%), severe stenosis mid RCA, distal Cx.  Recommendation to undergo cardiac catheterization.    LHC 11/21/2022 revealed to have vessel disease consisting of serial right coronary artery lesions, high-grade left circumflex disease RFR positive LAD disease, and moderate second diagonal disease.  He underwent CABG x 4 on 01/02/2023 with (LIMA-LAD, SVG-Diag 2, SVG-OM, SVG-PDA). Unfortunately, he had a lengthy hospitalization following a hard  cough that caused broken ribs, sternal fracture, and wound dehiscence. Was taken back to OR for rewiring and subsequent additional surgeries for wound healing and right pectoralis muscle flap. Was hospitalized total of 40 days.  Today, he is here for follow-up and to discuss medical therapy. He is feeling well. Seen by plastic surgery PA today, wound healing is on track. Numbness across upper chest, getting some feeling back. No evidence of a fib since initial presentation during hospitalization. He is feeling well, working outside for hours during the day, doing Aeronautical engineer and house projects. No chest pain, shortness of breath, orthopnea, PND, edema, presyncope, or syncope. Recently, having symptoms of hot flashes and itching. This causes difficulty sleeping which is abnormal for him, previously sleeping 6+ hours nightly. He discussed with PCP who reported typical cause was thyroid but his thyroid function had been normal. We reviewed each of his medications. He has lost 30 lbs with healthy diet and exercise. Plans to participate in cardiac rehab in [place].   Past Medical History:  Diagnosis Date   Acne    ADHD (attention deficit hyperactivity disorder)    Arthritis    Asthma    Bipolar 1 disorder (HCC)    Cancer (HCC)    basal cell cancer removed from back   Cataract    Coronary artery disease    Depression    Dyspnea    Dysrhythmia    PVCs   GERD (gastroesophageal reflux disease)    Heart murmur    Hepatitis    remote hx Hepatitis A (caused by food contaminant in childhood)   History of hiatal hernia    Hyperlipidemia    Hypertension    Mitral valve prolapse    Panic attacks    Peripheral vascular disease (HCC)    PFO (patent foramen ovale)    ? small PFO per echo   PONV (postoperative nausea and vomiting)    Pre-diabetes    Sleep apnea     Past Surgical History:  Procedure Laterality Date   APPLICATION OF WOUND VAC N/A 01/20/2023   Procedure: APPLICATION OF WOUND VAC;   Surgeon: Lovett Sox, MD;  Location: [facility] OR;  Service: Thoracic;  Laterality: N/A;   APPLICATION OF WOUND VAC N/A 01/23/2023   Procedure: APPLICATION OF WOUND VAC;  Surgeon: Peggye Form, DO;  Location: [facility] OR;  Service: Plastics;  Laterality: N/A;   APPLICATION OF WOUND VAC N/A 01/29/2023   Procedure: APPLICATION OF WOUND VAC;  Surgeon: Peggye Form, DO;  Location: [facility] OR;  Service: Plastics;  Laterality: N/A;   CORONARY ARTERY BYPASS GRAFT N/A 01/02/2023   Procedure: CORONARY ARTERY BYPASS GRAFTING (CABG) TIMES FOUR USING THE LEFT INTERNAL MAMMARY ARTERY (LIMA) AND BILATERAL GREATER SAPHENOUS VEIN ARTERIES;  Surgeon: [name], MD;  Location: [facility] OR;  Service: Open Heart Surgery;  Laterality: N/A;  Open median sternotomy   CORONARY PRESSURE/FFR STUDY N/A 11/21/2022   Procedure: INTRAVASCULAR PRESSURE WIRE/FFR STUDY;  Surgeon: Orbie Pyo, MD;  Location: [facility] IN[facility];  Service: Cardiovascular;  Laterality: N/A;   HYDROCELE EXCISION Bilateral 03/22/2022   Procedure: HYDROCELECTOMY ADULT;  Surgeon: Noel Christmas, MD;  Location: [facility] ORS;  Service: Urology;  Laterality: Bilateral;  2 HRS   INCISION AND DRAINAGE OF WOUND N/A 01/29/2023   Procedure: Sternal wound irrigation and debridement, placement of myriad and wound VAC change;  Surgeon: Peggye Form, DO;  Location: [facility] OR;  Service: Plastics;  Laterality: N/A;   INCISION AND DRAINAGE OF WOUND N/A 01/31/2023   Procedure: IRRIGATION AND DEBRIDEMENT OF STERNAL WOUND;  Surgeon: Peggye Form, DO;  Location: [facility];  Service: Plastics;  Laterality: N/A;   LEFT HEART CATH AND CORONARY ANGIOGRAPHY N/A 11/21/2022   Procedure: LEFT HEART CATH AND CORONARY ANGIOGRAPHY;  Surgeon: Orbie Pyo, MD;  Location: [facility];  Service: Cardiovascular;  Laterality: N/A;   MOHS SURGERY  1990   PECTORALIS FLAP Right 01/31/2023   Procedure: RIGHT PECTORALIS FLAP;  Surgeon: Peggye Form, DO;  Location: [facility];   Service: Plastics;  Laterality: Right;   STERNAL INCISION RECLOSURE N/A 01/07/2023   Procedure: STERNAL REWIRING;  Surgeon: [name], MD;  Location: [facility];  Service: Thoracic;  Laterality: N/A;   STERNAL WOUND DEBRIDEMENT N/A 01/20/2023   Procedure: DRAIN STERNAL INFECTION;  Surgeon: Lovett Sox, MD;  Location: [facility] OR;  Service: Thoracic;  Laterality: N/A;   STERNAL WOUND DEBRIDEMENT N/A 01/23/2023   Procedure: Excision of sternal wound with Myriad;  Surgeon: Peggye Form, DO;  Location: [facility] OR;  Service: Plastics;  Laterality: N/A;   STRABISMUS SURGERY Left    TEE WITHOUT CARDIOVERSION N/A 01/02/2023   Procedure: TRANSESOPHAGEAL ECHOCARDIOGRAM;  Surgeon: [name], MD;  Location: [facility] OR;  Service: Open Heart Surgery;  Laterality: N/A;   TONSILLECTOMY AND ADENOIDECTOMY     WISDOM TOOTH EXTRACTION      Current Medications: Current Meds  Medication Sig   albuterol (VENTOLIN HFA) 108 (90 Base) MCG/ACT inhaler Inhale 2 puffs into the lungs every 6 (six) hours as needed for wheezing or shortness of breath.   amLODipine (NORVASC) 5 MG tablet Take 1 tablet (5 mg total) by mouth daily.   APPLE CIDER VINEGAR PO Take 15 mLs by mouth every Monday, Wednesday, and Friday.   Ascorbic Acid (VITAMIN C) 1000 MG tablet Take 3,000 mg by mouth daily.   calcium elemental as carbonate (TUMS ULTRA 1000) 400 MG chewable tablet Chew 2,000 mg by mouth 3 (three) times daily as needed for heartburn.   Cholecalciferol (DIALYVITE VITAMIN D 5000) 125 MCG (5000 UT) capsule Take 20,000 Units by mouth daily.   Coenzyme Q10 (COQ-10) 100 MG CAPS Take 100 mg by mouth daily.   docusate sodium (COLACE) 100 MG capsule Take 2 capsules (200 mg total) by mouth daily. (Patient taking differently: Take 200 mg by mouth as needed.)   Flaxseed, Linseed, (FLAX SEED OIL) 1000 MG CAPS Take 1,000 mg by mouth daily.   folic acid (FOLVITE) 800 MCG tablet Take 800 mcg by mouth daily.   furosemide (LASIX) 20 MG tablet Take 1  tablet (20 mg total) by mouth daily as needed (swelling).   inclisiran (LEQVIO) 284 MG/1.5ML SOSY injection Inject 1.45ml every 6 months   lamoTRIgine (LAMICTAL) 100 MG tablet Take 100 mg by mouth daily.   Liniments (BLUE-EMU SUPER STRENGTH EX) Apply 1 Application topically daily as needed (pain).   magnesium oxide (MAG-OX) 400 (240 Mg) MG tablet Take 400 mg by mouth daily.   metoprolol tartrate (LOPRESSOR) 25 MG tablet Take 1.5 tablets (37.5 mg total) by mouth 2 (two) times daily.   Multiple Vitamins-Minerals (MULTIVITAMIN WITH MINERALS)  tablet Take 1 tablet by mouth daily.   Omega 3 1000 MG CAPS Take 1,000 mg by mouth daily.   OVER THE COUNTER MEDICATION Nano CBD OIL: Pt takes 20 drops in the morning and 20 drops in the evening.   rosuvastatin (CRESTOR) 10 MG tablet Take 1 tablet (10 mg total) by mouth daily.   sildenafil (VIAGRA) 100 MG tablet Take 100 mg by mouth daily as needed for erectile dysfunction.   zinc gluconate 50 MG tablet Take 50 mg by mouth daily.   [DISCONTINUED] amiodarone (PACERONE) 200 MG tablet TAKE 1 TABLET BY MOUTH DAILY.   [DISCONTINUED] aspirin EC 325 MG tablet Take 1 tablet (325 mg total) by mouth daily.   [DISCONTINUED] furosemide (LASIX) 40 MG tablet Take 1 tablet (40 mg total) by mouth every other day.   [DISCONTINUED] Magnesium 400 MG TABS Take 400 mg by mouth daily.     Allergies:   Latex and Lisinopril   Social History   Socioeconomic History   Marital status: Media planner    Spouse name: Not on file   Number of children: 2   Years of education: Not on file   Highest education level: Bachelor's degree (e.g., BA, AB, BS)  Occupational History   Occupation: Horticulturist, commercial    Comment: Part-Time  Tobacco Use   Smoking status: Never   Smokeless tobacco: Current    Types: Snuff  Vaping Use   Vaping status: Never Used  Substance and Sexual Activity   Alcohol use: Yes    Alcohol/week: 1.0 - 2.0 standard drink of alcohol    Types: 1 - 2 Standard  drinks or equivalent per week   Drug use: Never   Sexual activity: Yes  Other Topics Concern   Not on file  Social History Narrative   Not on file   Social Determinants of Health   Financial Resource Strain: Low Risk  (03/06/2023)   Overall Financial Resource Strain (CARDIA)    Difficulty of Paying Living Expenses: Not hard at all  Food Insecurity: No Food Insecurity (03/06/2023)   Hunger Vital Sign    Worried About Running Out of Food in the Last Year: Never true    Ran Out of Food in the Last Year: Never true  Transportation Needs: No Transportation Needs (03/06/2023)   PRAPARE - Administrator, Civil Service (Medical): No    Lack of Transportation (Non-Medical): No  Physical Activity: Insufficiently Active (03/06/2023)   Exercise Vital Sign    Days of Exercise per Week: 2 days    Minutes of Exercise per Session: 10 min  Stress: No Stress Concern Present (03/06/2023)   [employer] of Occupational Health - Occupational Stress Questionnaire    Feeling of Stress : Not at all  Social Connections: Socially Integrated (03/06/2023)   Social Connection and Isolation Panel [NHANES]    Frequency of Communication with Friends and Family: More than three times a week    Frequency of Social Gatherings with Friends and Family: Once a week    Attends Religious Services: More than 4 times per year    Active Member of [employer] or Organizations: Yes    Attends Engineer, structural: More than 4 times per year    Marital Status: Living with partner     Family History: The patient's family history includes Cancer in his father; Diabetes in his mother; Heart disease in his father and mother; Hyperlipidemia in his father and mother; Obesity in his mother. There is no  history of Colon cancer, Esophageal cancer, Rectal cancer, or Stomach cancer.  ROS:   Please see the history of present illness.    + hot flashes + itching All other systems reviewed and are negative.    Labs/Other Studies Reviewed:    The following studies were reviewed today:  LHC 11/21/22  Prox Cx lesion is 80% stenosed.   1st Mrg lesion is 60% stenosed.   Mid LAD lesion is 60% stenosed.   2nd Diag lesion is 60% stenosed.   Prox RCA-1 lesion is 80% stenosed.   Prox RCA-2 lesion is 80% stenosed.   Dist RCA lesion is 80% stenosed.   1.  Multivessel disease consisting of serial right coronary artery lesions, high-grade left circumflex disease, RFR positive LAD disease, and moderate second diagonal disease. 2.  LVEDP of .   Recommendation: Outpatient cardiothoracic surgical evaluation.   Steffanie Dunn 12/14/21    Findings are consistent with no prior ischemia and no prior myocardial infarction. The study is intermediate risk.   No ST deviation was noted.   Left ventricular function is abnormal. Nuclear stress EF: 48 %. The left ventricular ejection fraction is mildly decreased (45-54%). End diastolic cavity size is normal. End systolic cavity size is normal.   Prior study not available for comparison.   Findings: Negative for stress induced arrhythmias. No evidence of ischemia or infarction. LV function is mildly reduced.   Conclusions: Stress test is negative. Intermediate risk due to decrease LV function, thought function look better on concomitant echo  Echo 12/14/21  1. Left ventricular ejection fraction, by estimation, is 65 to 70%. The  left ventricle has normal function. The left ventricle has no regional  wall motion abnormalities. There is mild left ventricular hypertrophy.  Left ventricular diastolic parameters  are consistent with Grade I diastolic dysfunction (impaired relaxation).  The average left ventricular global longitudinal strain is -19.8 %. The  global longitudinal strain is normal.   2. Right ventricular systolic function is normal. The right ventricular  size is normal.   3. The mitral valve leaflets are elongated - no obvious prolapse.  The  mitral valve is abnormal. Trivial mitral valve regurgitation.   4. The aortic valve is tricuspid. Aortic valve regurgitation is not  visualized.   5. The inferior vena cava is normal in size with greater than 50%  respiratory variability, suggesting right atrial pressure of 3 mmHg.   6. Cannot exclude a small PFO.   Recent Labs: 01/24/2023: Magnesium 2.0 02/18/2023: BUN 18; Creatinine, Ser 1.15; Hemoglobin 11.6; Platelets 313.0; Potassium 4.2; Sodium 138 03/18/2023: ALT 25  Recent Lipid Panel    Component Value Date/Time   CHOL 166 03/18/2023 0850   TRIG 155 (H) 03/18/2023 0850   HDL 45 03/18/2023 0850   CHOLHDL 3.7 03/18/2023 0850   CHOLHDL 4 10/17/2021 1705   VLDL 22.2 10/17/2021 1705   LDLCALC 94 03/18/2023 0850     Risk Assessment/Calculations:      Physical Exam:    VS:  BP 130/68   Pulse 80   Ht 5\' 11"  (1.803 m)   Wt 299 lb 12.8 oz (136 kg)   SpO2 95%   BMI 41.81 kg/m     Wt Readings from Last 3 Encounters:  04/01/23 299 lb 12.8 oz (136 kg)  03/31/23 297 lb 6.4 oz (134.9 kg)  03/10/23 292 lb (132.5 kg)     GEN: Obese gentleman in no acute distress HEENT: Normal NECK: No JVD; No carotid bruits CARDIAC: RRR, no  murmurs, rubs, gallops RESPIRATORY:  Coarse breath sounds bilaterally with expiratory wheezing   ABDOMEN: Soft, non-tender, protuberant MUSCULOSKELETAL:  No edema; No deformity. 2+ pedal pulses, equal bilaterally SKIN: Warm and dry NEUROLOGIC:  Alert and oriented x 3 PSYCHIATRIC:  Normal affect   EKG:  EKG is not ordered today.   Diagnoses:    1. Coronary artery disease involving native coronary artery of native heart without angina pectoris   2. Medication management   3. Essential hypertension   4. Dyslipidemia   5. Postoperative atrial fibrillation (HCC)   6. Hot flashes   7. Itching      Assessment and Plan:     CAD without angina: S/p CABG x 4 on 01/02/23. He had post-op complications including sternal dehiscence with lengthy  hospitalization and 5 subsequent surgeries.  He is overall doing very well and has resumed regular activities at home. DOE has resolved since undergoing CABG. Has numbness across his upper chest where muscle flap was attached, getting some feeling back gradually. He denies chest pain, dyspnea, or other symptoms concerning for angina.  No indication for further ischemic evaluation at this time.  He is no longer having any LE edema.  We will change Lasix to 20 mg as needed. No bleeding problems.  We will lower aspirin dose to 81 mg daily. Continue amlodipine, metoprolol, rosuvastatin, Leqvio. Has repeat lipid panel scheduled for 05/22/23.   Post-operative atrial fibrillation: He has been on amiodarone since hospital discharge due to postop a fib. He has had no symptoms of a fib since hospital discharge. Lengthy discussion about wearing a monitor. He regularly monitors heart rate and BP.  He would like to forgo the monitor and just monitor on his own. We will reduce amiodarone to 100 mg daily x 1 week then discontinue.  Advised him to notify us if he has any concerns.  Hypertension: BP initially elevated but improved on my recheck. Thinks BP cuff is not working well. OT monitoring BP at home, it was well controlled. He is ordering a new cuff.  Continue metoprolol and amlodipine.   Medication management: We individually reviewed each medication. Questions were answered to his satisfaction. Recommendations were discussed and agreed upon and documentation was provided to him.   Mitral valve prolapse: Echo 11/21/22 with normal appearing mitral valve, no evidence of MR or stenosis, LVEF 55-60%. He is asymptomatic. We will continue to monitor clinically at this time.   Hot flashes/itching: Several weeks of hot flashes and subsequent generalized itching. Symptoms occur randomly but are impacting sleep on occasion. He discussed this with PCP and was told his thyroid function was normal although I do not see that it has  been checked recently.  He plans to see a thyroid specialist. Advised that symptoms could be medication side effect, although no new medications for several months. Consider that it could be a side effect of multiple surgeries over the course of 30 days. Also consider amiodarone could be contributing. We are discontinuing amiodarone as noted above. Recommend that he keep appointment to discuss thyroid.    Hyperlipidemia LDL < 55: LDL 94 on 03/18/23. He had second injection of Leqvio on 03/31/23. Has repeat lipid panel scheduled for September. Continue rosuvastatin and Leqvio.     Disposition: Keep your October appointment with Jari Favre, PA/6 months with Dr. Clifton James (transfer from Dr. Shari Prows)   Shared Decision Making/Informed Consent The risks [stroke (1 in 1000), death (1 in 1000), kidney failure [usually temporary] (1 in 500), bleeding (1  in 200), allergic reaction [possibly serious] (1 in 200)], benefits (diagnostic support and management of coronary artery disease) and alternatives of a cardiac catheterization were discussed in detail with Mr. Honse and he is willing to proceed.   Medication Adjustments/Labs and Tests Ordered: Current medicines are reviewed at length with the patient today.  Concerns regarding medicines are outlined above.  No orders of the defined types were placed in this encounter.  Meds ordered this encounter  Medications   furosemide (LASIX) 20 MG tablet    Sig: Take 1 tablet (20 mg total) by mouth daily as needed (swelling).    Dispense:  30 tablet    Refill:  3   aspirin EC 81 MG tablet    Sig: Take 1 tablet (81 mg total) by mouth daily.    Patient Instructions  Medication Instructions:   DECREASE Asprin one (1) tablet by mouth ( 81 mg ) daily.  TAKE Lasix one (1) tablet by mouth ( 20 mg) as needed for swelling.   DECREASE Amiodarone one half (0.5) tablet by mouth ( 100 mg ) for one week than discontinue.   *If you need a refill on your cardiac  medications before your next appointment, please call your pharmacy*   Lab Work:  Keep your lab appointment.   If you have labs (blood work) drawn today and your tests are completely normal, you will receive your results only by: MyChart Message (if you have MyChart) OR A paper copy in the mail If you have any lab test that is abnormal or we need to change your treatment, we will call you to review the results.   Testing/Procedures:   None ordered.   Follow-Up: At [facility], you and your health needs are our priority.  As part of our continuing mission to provide you with exceptional heart care, we have created designated Provider Care Teams.  These Care Teams include your primary Cardiologist (physician) and Advanced Practice Providers (APPs -  Physician Assistants and Nurse Practitioners) who all work together to provide you with the care you need, when you need it.  We recommend signing up for the patient portal called "MyChart".  Sign up information is provided on this After Visit Summary.  MyChart is used to connect with patients for Virtual Visits (Telemedicine).  Patients are able to view lab/test results, encounter notes, upcoming appointments, etc.  Non-urgent messages can be sent to your provider as well.   To learn more about what you can do with MyChart, go to [email or web address].    Your next appointment:   2 month(s)  Provider:   Jari Favre, PA-C     Then, Verne Carrow, MD will plan to see you again in 6 month(s).    Other Instructions  Your physician wants you to follow-up in: 6 months with Dr. Clifton James.  You will receive a reminder letter in the mail two months in advance. If you don't receive a letter, please call our office to schedule the follow-up appointment.     Signed, Levi Aland, NP  04/01/2023 4:29 PM    [facility]

## 2023-03-31 ENCOUNTER — Encounter: Payer: Medicare Other | Admitting: Physician Assistant

## 2023-03-31 ENCOUNTER — Ambulatory Visit (INDEPENDENT_AMBULATORY_CARE_PROVIDER_SITE_OTHER): Payer: Medicare Other

## 2023-03-31 VITALS — BP 145/75 | HR 88 | Temp 97.4°F | Resp 19 | Ht 71.0 in | Wt 297.4 lb

## 2023-03-31 DIAGNOSIS — E785 Hyperlipidemia, unspecified: Secondary | ICD-10-CM

## 2023-03-31 DIAGNOSIS — I10 Essential (primary) hypertension: Secondary | ICD-10-CM

## 2023-03-31 MED ORDER — INCLISIRAN SODIUM 284 MG/1.5ML ~~LOC~~ SOSY
284.0000 mg | PREFILLED_SYRINGE | Freq: Once | SUBCUTANEOUS | Status: AC
  Administered 2023-03-31: 284 mg via SUBCUTANEOUS
  Filled 2023-03-31: qty 1.5

## 2023-03-31 NOTE — Progress Notes (Signed)
Diagnosis: Hyperlipidemia  Provider:  Chilton Greathouse MD  Procedure: Injection  Leqvio (inclisiran), Dose: 284 mg, Site: subcutaneous, Number of injections: 1  Post Care: Patient declined observation. Left lower abdomen injection  Discharge: Condition: Good, Destination: Home . AVS Provided  Performed by:  Rico Ala, LPN

## 2023-04-01 ENCOUNTER — Encounter: Payer: Self-pay | Admitting: Nurse Practitioner

## 2023-04-01 ENCOUNTER — Ambulatory Visit: Payer: Medicare Other | Admitting: Nurse Practitioner

## 2023-04-01 ENCOUNTER — Ambulatory Visit (INDEPENDENT_AMBULATORY_CARE_PROVIDER_SITE_OTHER): Payer: Medicare Other | Admitting: Physician Assistant

## 2023-04-01 VITALS — BP 130/68 | HR 80 | Ht 71.0 in | Wt 299.8 lb

## 2023-04-01 DIAGNOSIS — E785 Hyperlipidemia, unspecified: Secondary | ICD-10-CM | POA: Diagnosis present

## 2023-04-01 DIAGNOSIS — L299 Pruritus, unspecified: Secondary | ICD-10-CM | POA: Diagnosis present

## 2023-04-01 DIAGNOSIS — R232 Flushing: Secondary | ICD-10-CM | POA: Insufficient documentation

## 2023-04-01 DIAGNOSIS — Z79899 Other long term (current) drug therapy: Secondary | ICD-10-CM | POA: Insufficient documentation

## 2023-04-01 DIAGNOSIS — Z8679 Personal history of other diseases of the circulatory system: Secondary | ICD-10-CM | POA: Insufficient documentation

## 2023-04-01 DIAGNOSIS — I9789 Other postprocedural complications and disorders of the circulatory system, not elsewhere classified: Secondary | ICD-10-CM | POA: Insufficient documentation

## 2023-04-01 DIAGNOSIS — I2510 Atherosclerotic heart disease of native coronary artery without angina pectoris: Secondary | ICD-10-CM | POA: Insufficient documentation

## 2023-04-01 DIAGNOSIS — I4891 Unspecified atrial fibrillation: Secondary | ICD-10-CM | POA: Diagnosis present

## 2023-04-01 DIAGNOSIS — Z9889 Other specified postprocedural states: Secondary | ICD-10-CM

## 2023-04-01 DIAGNOSIS — T8132XD Disruption of internal operation (surgical) wound, not elsewhere classified, subsequent encounter: Secondary | ICD-10-CM

## 2023-04-01 DIAGNOSIS — I10 Essential (primary) hypertension: Secondary | ICD-10-CM | POA: Insufficient documentation

## 2023-04-01 MED ORDER — ASPIRIN 81 MG PO TBEC: 81.0000 mg | DELAYED_RELEASE_TABLET | Freq: Every day | ORAL | Status: AC

## 2023-04-01 MED ORDER — FUROSEMIDE 20 MG PO TABS: 20.0000 mg | ORAL_TABLET | Freq: Every day | ORAL | 3 refills | Status: DC | PRN

## 2023-04-01 NOTE — Patient Instructions (Signed)
Medication Instructions:   DECREASE Asprin one (1) tablet by mouth ( 81 mg ) daily.  TAKE Lasix one (1) tablet by mouth ( 20 mg) as needed for swelling.   DECREASE Amiodarone one half (0.5) tablet by mouth ( 100 mg ) for one week than discontinue.   *If you need a refill on your cardiac medications before your next appointment, please call your pharmacy*   Lab Work:  Keep your lab appointment.   If you have labs (blood work) drawn today and your tests are completely normal, you will receive your results only by: MyChart Message (if you have MyChart) OR A paper copy in the mail If you have any lab test that is abnormal or we need to change your treatment, we will call you to review the results.   Testing/Procedures:   None ordered.   Follow-Up: At [facility], you and your health needs are our priority.  As part of our continuing mission to provide you with exceptional heart care, we have created designated Provider Care Teams.  These Care Teams include your primary Cardiologist (physician) and Advanced Practice Providers (APPs -  Physician Assistants and Nurse Practitioners) who all work together to provide you with the care you need, when you need it.  We recommend signing up for the patient portal called "MyChart".  Sign up information is provided on this After Visit Summary.  MyChart is used to connect with patients for Virtual Visits (Telemedicine).  Patients are able to view lab/test results, encounter notes, upcoming appointments, etc.  Non-urgent messages can be sent to your provider as well.   To learn more about what you can do with MyChart, go to [email or web address].    Your next appointment:   2 month(s)  Provider:   Jari Favre, PA-C     Then, Verne Carrow, MD will plan to see you again in 6 month(s).    Other Instructions  Your physician wants you to follow-up in: 6 months with Dr. Clifton James.  You will receive a reminder letter in the mail  two months in advance. If you don't receive a letter, please call our office to schedule the follow-up appointment.

## 2023-04-01 NOTE — Progress Notes (Addendum)
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 Ulice Bold who presents to clinic for postoperative follow-up.   He was last seen here in clinic on 03/11/2023.  At that time, he endorsed a bit of "puffiness" over the inferior aspect of his chest incision.  Otherwise, no complaints.  Incision appears to be fully epithelialized and well-healed.  Tape dermatitis along periphery and a rectangular pattern consistent with the bandages.  Otherwise, exam was benign.  Recommended Desitin ointment for the tape dermatitis and follow-up in 3 weeks.  Continued activity modifications in the interim.  Today, patient is doing well.  He still reports a persistent "puffiness" on the right side of his sternal incision over the site of his right pectoralis advancement.  He was concerned for effusion.  Denies any pain symptoms or overlying skin changes.  He actually is having some return of his sensation across the chest, but only on the left side at this point.  He also is hopeful that he can be cleared to shower normally given that he is becoming tired some of the sponge bathing.  He still adheres to the lifting restrictions set out by CT surgery.  On exam, incisions are all thoroughly well-healed.  Healthy appearing.  No overlying skin changes.  Resolution of tape dermatitis.  Drain tube insertion sites also appear well-healed.  There is a bit of puffiness where patient is concerned, but no palpable underlying fluid collections or ballottement.  No obvious effusion.  No fluctuance or crepitus.  No erythema or other overlying skin changes.  Nontender.  Suspect that the tissue puffiness at that location is likely due to the advancement of the right pectoralis muscle and is not reflective of something pathologic such as a seroma.  Do not feel as though aspiration attempt  would be safe or warranted.  Discussed case with Dr. Ulice Bold who agrees with that assessment and states that it could be reflective of the muscle advancement.   He appears well-healed.  Recommend that he continue to adhere to the restrictions set up by his CT surgical team and advance per their recommendations.  He is quite pleased and grateful with the outcome of his surgery with our team.  Follow-up at this point only as needed.  Happy to see him if he has any additional questions or concerns in the future.  Picture(s) obtained of the patient and placed in the chart were with the patient's or guardian's permission.

## 2023-04-08 ENCOUNTER — Other Ambulatory Visit: Payer: Self-pay | Admitting: Physician Assistant

## 2023-04-11 ENCOUNTER — Encounter: Payer: Self-pay | Admitting: Family Medicine

## 2023-04-11 ENCOUNTER — Ambulatory Visit (INDEPENDENT_AMBULATORY_CARE_PROVIDER_SITE_OTHER): Payer: Medicare Other | Admitting: Family Medicine

## 2023-04-11 VITALS — BP 130/82 | HR 82 | Temp 98.2°F | Ht 71.0 in | Wt 295.8 lb

## 2023-04-11 DIAGNOSIS — I10 Essential (primary) hypertension: Secondary | ICD-10-CM | POA: Diagnosis not present

## 2023-04-11 DIAGNOSIS — I2510 Atherosclerotic heart disease of native coronary artery without angina pectoris: Secondary | ICD-10-CM | POA: Diagnosis not present

## 2023-04-11 DIAGNOSIS — R748 Abnormal levels of other serum enzymes: Secondary | ICD-10-CM | POA: Insufficient documentation

## 2023-04-11 DIAGNOSIS — Z951 Presence of aortocoronary bypass graft: Secondary | ICD-10-CM

## 2023-04-11 DIAGNOSIS — E785 Hyperlipidemia, unspecified: Secondary | ICD-10-CM | POA: Diagnosis not present

## 2023-04-11 NOTE — Assessment & Plan Note (Signed)
Working with cardiology on lipid management. Although the LDL is now under 100, his goal is to get this to around 50. Continue rosuvastatin 10 mg daily, omega-3 fatty acids 1 gm daily, and inclisiran (Leqvio) injections every 3 months.

## 2023-04-11 NOTE — Progress Notes (Signed)
[facility] [address] [place] [address] Dept: [phone] Dept Fax: [phone]  Chronic Care Office Visit  Subjective:    Patient ID: Bryan Brock, male    DOB: 1955-09-19, 67 y.o..   MRN: [id number]  Chief Complaint  Patient presents with   Follow-up    6 week f/u. No concerns.    History of Present Illness:  Patient is in today for reassessment of chronic medical issues.  Bryan Brock has a history of coronary artery disease. In March, he had a cardiac catheterization, which showed multi-vessel CAD. In May, he underwent 4-vessel CABG. Post-operatively, he developed an issue with dehiscence of his sternal operative wound and had a complicated course to heal this. He notes the wound is now fully closed. He will start cardiac rehabilitation on 04/22/2023. He is excited to move to this next phase of his recovery. His cardiac issues are being managed with metoprolol tartrate 37.5 mg bid and lipid management. He had some post-operative atrial fibrillation and had been on amiodarone. This has now been stopped and he has had no recurrence.   Bryan Brock has a history hypertension. He is currently managed on metoprolol tartrate 37.5 mg bid and amlodipine 5 mg daily.   Bryan Brock has a history of hyperlipidemia. He is managed on rosuvastatin 10 mg daily, omega-3 fatty acids 1 gm daily, and inclisiran (Leqvio) injections every 3 months.  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   OSA (obstructive sleep apnea) 12/19/2021   Primary hypertension  10/29/2021   Elevated transaminase level 10/18/2021   Prediabetes 10/17/2021   Morbid obesity with BMI of 40.0-44.9, adult (HCC) 10/17/2021   Bipolar 1 disorder, depressed (HCC) 05/11/2020   History of basal cell carcinoma 11/05/2007   Hyperlipidemia 11/05/2007   Premature ventricular contractions 11/05/2007   History of mitral valve prolapse 11/05/2007   Past Surgical History:  Procedure Laterality Date   APPLICATION OF WOUND VAC N/A 01/20/2023   Procedure: APPLICATION OF WOUND VAC;  Surgeon: Lovett Sox, MD;  Location: [facility] OR;  Service: Thoracic;  Laterality: N/A;   APPLICATION OF WOUND VAC N/A 01/23/2023   Procedure: APPLICATION OF WOUND VAC;  Surgeon: Peggye Form, DO;  Location: [facility] OR;  Service: Plastics;  Laterality: N/A;   APPLICATION OF WOUND VAC N/A 01/29/2023   Procedure: APPLICATION OF WOUND VAC;  Surgeon: Peggye Form, DO;  Location: [facility] OR;  Service: Plastics;  Laterality: N/A;   CORONARY ARTERY BYPASS GRAFT N/A 01/02/2023   Procedure: CORONARY ARTERY BYPASS GRAFTING (CABG) TIMES FOUR USING THE LEFT INTERNAL MAMMARY ARTERY (LIMA) AND BILATERAL GREATER SAPHENOUS VEIN ARTERIES;  Surgeon: [name], MD;  Location: [facility] OR;  Service: Open Heart Surgery;  Laterality: N/A;  Open median sternotomy   CORONARY PRESSURE/FFR STUDY N/A 11/21/2022   Procedure: INTRAVASCULAR PRESSURE WIRE/FFR STUDY;  Surgeon: Orbie Pyo, MD;  Location: [facility];  Service: Cardiovascular;  Laterality: N/A;   HYDROCELE EXCISION Bilateral 03/22/2022   Procedure: HYDROCELECTOMY ADULT;  Surgeon: Noel Christmas, MD;  Location: [facility] ORS;  Service: Urology;  Laterality: Bilateral;  2  HRS   INCISION AND DRAINAGE OF WOUND N/A 01/29/2023   Procedure: Sternal wound irrigation and debridement, placement of myriad and wound VAC change;  Surgeon: Peggye Form, DO;  Location: [facility] OR;  Service: Plastics;  Laterality: N/A;   INCISION AND DRAINAGE OF WOUND N/A 01/31/2023   Procedure:  IRRIGATION AND DEBRIDEMENT OF STERNAL WOUND;  Surgeon: Peggye Form, DO;  Location: [facility] OR;  Service: Plastics;  Laterality: N/A;   LEFT HEART CATH AND CORONARY ANGIOGRAPHY N/A 11/21/2022   Procedure: LEFT HEART CATH AND CORONARY ANGIOGRAPHY;  Surgeon: Orbie Pyo, MD;  Location: [facility];  Service: Cardiovascular;  Laterality: N/A;   MOHS SURGERY  1990   PECTORALIS FLAP Right 01/31/2023   Procedure: RIGHT PECTORALIS FLAP;  Surgeon: Peggye Form, DO;  Location: [facility] OR;  Service: Plastics;  Laterality: Right;   STERNAL INCISION RECLOSURE N/A 01/07/2023   Procedure: STERNAL REWIRING;  Surgeon: [name], MD;  Location: [facility] OR;  Service: Thoracic;  Laterality: N/A;   STERNAL WOUND DEBRIDEMENT N/A 01/20/2023   Procedure: DRAIN STERNAL INFECTION;  Surgeon: Lovett Sox, MD;  Location: [facility] OR;  Service: Thoracic;  Laterality: N/A;   STERNAL WOUND DEBRIDEMENT N/A 01/23/2023   Procedure: Excision of sternal wound with Myriad;  Surgeon: Peggye Form, DO;  Location: [facility] OR;  Service: Plastics;  Laterality: N/A;   STRABISMUS SURGERY Left    TEE WITHOUT CARDIOVERSION N/A 01/02/2023   Procedure: TRANSESOPHAGEAL ECHOCARDIOGRAM;  Surgeon: [name], MD;  Location: [facility] OR;  Service: Open Heart Surgery;  Laterality: N/A;   TONSILLECTOMY AND ADENOIDECTOMY     WISDOM TOOTH EXTRACTION     Family History  Problem Relation Age of Onset   Heart disease Mother    Diabetes Mother    Obesity Mother    Hyperlipidemia Mother    Cancer Father        skin cancer   Heart disease Father    Hyperlipidemia Father    Colon cancer Neg Hx    Esophageal cancer Neg Hx    Rectal cancer Neg Hx    Stomach cancer Neg Hx    Outpatient Medications Prior to Visit  Medication Sig Dispense Refill   albuterol (VENTOLIN HFA) 108 (90 Base) MCG/ACT inhaler Inhale 2 puffs into the lungs every 6 (six) hours as needed for wheezing or shortness of breath. 6.7 g 0   amLODipine (NORVASC) 5 MG  tablet Take 1 tablet (5 mg total) by mouth daily. 90 tablet 3   APPLE CIDER VINEGAR PO Take 15 mLs by mouth every Monday, Wednesday, and Friday.     Ascorbic Acid (VITAMIN C) 1000 MG tablet Take 3,000 mg by mouth daily.     aspirin EC 81 MG tablet Take 1 tablet (81 mg total) by mouth daily.     calcium elemental as carbonate (TUMS ULTRA 1000) 400 MG chewable tablet Chew 2,000 mg by mouth 3 (three) times daily as needed for heartburn.     Cholecalciferol (DIALYVITE VITAMIN D 5000) 125 MCG (5000 UT) capsule Take 20,000 Units by mouth daily.     Coenzyme Q10 (COQ-10) 100 MG CAPS Take 100 mg by mouth daily.     docusate sodium (COLACE) 100 MG capsule Take 2 capsules (200 mg total) by mouth daily. (Patient taking differently: Take 200 mg by mouth as needed.) 30 capsule 0   Flaxseed, Linseed, (FLAX SEED OIL) 1000 MG CAPS Take 1,000 mg by mouth daily.     folic acid (  FOLVITE) 800 MCG tablet Take 800 mcg by mouth daily.     furosemide (LASIX) 20 MG tablet Take 1 tablet (20 mg total) by mouth daily as needed (swelling). 30 tablet 3   inclisiran (LEQVIO) 284 MG/1.5ML SOSY injection Inject 1.39ml every 6 months     lamoTRIgine (LAMICTAL) 100 MG tablet Take 100 mg by mouth daily.     Liniments (BLUE-EMU SUPER STRENGTH EX) Apply 1 Application topically daily as needed (pain).     magnesium oxide (MAG-OX) 400 (240 Mg) MG tablet Take 400 mg by mouth daily.     metoprolol tartrate (LOPRESSOR) 25 MG tablet Take 1.5 tablets (37.5 mg total) by mouth 2 (two) times daily. 270 tablet 3   Multiple Vitamins-Minerals (MULTIVITAMIN WITH MINERALS) tablet Take 1 tablet by mouth daily.     Omega 3 1000 MG CAPS Take 1,000 mg by mouth daily.     OVER THE COUNTER MEDICATION Nano CBD OIL: Pt takes 20 drops in the morning and 20 drops in the evening.     rosuvastatin (CRESTOR) 10 MG tablet Take 1 tablet (10 mg total) by mouth daily.     sildenafil (VIAGRA) 100 MG tablet Take 100 mg by mouth daily as needed for erectile  dysfunction.     zinc gluconate 50 MG tablet Take 50 mg by mouth daily.     No facility-administered medications prior to visit.   Allergies  Allergen Reactions   Latex Dermatitis   Lisinopril Cough   Objective:   Today's Vitals   04/11/23 0832  BP: 130/82  Pulse: 82  Temp: 98.2 F (36.8 C)  TempSrc: Temporal  SpO2: 95%  Weight: 295 lb 12.8 oz (134.2 kg)  Height: 5\' 11"  (1.803 m)   Body mass index is 41.26 kg/m.   General: Well developed, well nourished. No acute distress. Chest: Sternal wound is closed without any sign of infection. CV: RRR without murmurs or rubs. Pulses 2+ bilaterally. Extremities: Trace edema noted. Psych: Alert and oriented. Normal mood and affect.  Health Maintenance Due  Topic Date Due   Hepatitis C Screening  Never done   Zoster Vaccines- Shingrix (1 of 2) Never done   Lab Results    Latest Ref Rng & Units 03/18/2023    8:50 AM 02/18/2023   11:59 AM 02/10/2023    5:58 AM  CMP  Glucose 70 - 99 mg/dL  161  096   BUN 6 - 23 mg/dL  18  24   Creatinine 0.45 - 1.50 mg/dL  4.09  8.11   Sodium 914 - 145 mEq/L  138  140   Potassium 3.5 - 5.1 mEq/L  4.2  5.2   Chloride 96 - 112 mEq/L  100  101   CO2 19 - 32 mEq/L  27  19   Calcium 8.4 - 10.5 mg/dL  9.4  8.9   Total Protein 6.0 - 8.5 g/dL 6.7  6.5    Total Bilirubin 0.0 - 1.2 mg/dL 0.4  0.6    Alkaline Phos 44 - 121 IU/L 141  129    AST 0 - 40 IU/L 22  20    ALT 0 - 44 IU/L 25  22     Last lipids Lab Results  Component Value Date   CHOL 166 03/18/2023   HDL 45 03/18/2023   LDLCALC 94 03/18/2023   TRIG 155 (H) 03/18/2023   CHOLHDL 3.7 03/18/2023   Assessment & Plan:   Problem List Items Addressed This Visit  Cardiovascular and Mediastinum   Coronary artery disease    Recent 4 vessel CABG. Continue aspirin 81 mg daily and metoprolol tartrate 37.5 mg bid.      Primary hypertension - Primary    Blood pressure is adequately controlled. Continue metoprolol tartrate 37.5 mg bid  and amlodipine 5 mg daily.        Other   Hyperlipidemia    Working with cardiology on lipid management. Although the LDL is now under 100, his goal is to get this to around 50. Continue rosuvastatin 10 mg daily, omega-3 fatty acids 1 gm daily, and inclisiran (Leqvio) injections every 3 months.      S/P CABG x 4    Surgical wound now healed.       Return in about 3 months (around 07/12/2023) for Reassessment.   Loyola Mast, MD

## 2023-04-11 NOTE — Assessment & Plan Note (Signed)
Blood pressure is adequately controlled. Continue metoprolol tartrate 37.5 mg bid and amlodipine 5 mg daily.

## 2023-04-11 NOTE — Assessment & Plan Note (Signed)
Surgical wound now healed.

## 2023-04-11 NOTE — Assessment & Plan Note (Signed)
Recent 4 vessel CABG. Continue aspirin 81 mg daily and metoprolol tartrate 37.5 mg bid

## 2023-04-17 NOTE — Progress Notes (Signed)
Referring Provider Rudd, Bertram Millard, MD [address] [place],  [place] [address]   CC:  Chief Complaint  Patient presents with   Post-op Follow-up      Bryan Brock is an 67 y.o. male.  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 Ulice Bold who presents to clinic for follow-up.   He was last seen here in clinic on 04/01/2023.  At that time, endorsed a persistent "puffiness" on the right side of his sternal incision over the site of his right pectoralis advancement.  However, exam was entirely benign.  Resolution of tape dermatitis.  No fluid collections or obvious effusions appreciated.  Suspect that the puffiness was due to advancement of muscle and not pathologic.  Discussed case with Dr. Ulice Bold agreed.  No specific follow-up needed.  Instead, recommend continued adherence to the restrictions set by CT surgical team.  Advance per their recommendations.  Today, patient is okay.  He states that 2 days ago he woke up with what appeared to be a "small irruption" over the middle part of his sternal wound incision repair site.  He states that there was mild bleeding and that it occurred while he was asleep.  He reports that he sleeps without a shirt and may have potentially scratched it, but unclear how it happened.  He had not been regularly applying Vaseline.  He denies any pain, fevers, redness, swelling, or other symptoms.  However, he does endorse an odd sensation described as a "grinding" where his distal sternum was repaired that he plans to discuss with his CT surgeon.   Allergies  Allergen Reactions   Latex Dermatitis   Lisinopril Cough    Outpatient Encounter Medications as of 04/18/2023  Medication Sig   albuterol (VENTOLIN HFA) 108 (90 Base) MCG/ACT inhaler Inhale 2 puffs into the lungs every 6 (six)  hours as needed for wheezing or shortness of breath.   amLODipine (NORVASC) 5 MG tablet Take 1 tablet (5 mg total) by mouth daily.   APPLE CIDER VINEGAR PO Take 15 mLs by mouth every Monday, Wednesday, and Friday.   Ascorbic Acid (VITAMIN C) 1000 MG tablet Take 3,000 mg by mouth daily.   aspirin EC 81 MG tablet Take 1 tablet (81 mg total) by mouth daily.   calcium elemental as carbonate (TUMS ULTRA 1000) 400 MG chewable tablet Chew 2,000 mg by mouth 3 (three) times daily as needed for heartburn.   Cholecalciferol (DIALYVITE VITAMIN D 5000) 125 MCG (5000 UT) capsule Take 20,000 Units by mouth daily.   Coenzyme Q10 (COQ-10) 100 MG CAPS Take 100 mg by mouth daily.   docusate sodium (COLACE) 100 MG capsule Take 2 capsules (200 mg total) by mouth daily. (Patient taking differently: Take 200 mg by mouth as needed.)   Flaxseed, Linseed, (FLAX SEED OIL) 1000 MG CAPS Take 1,000 mg by mouth daily.   folic acid (FOLVITE) 800 MCG tablet Take 800 mcg by mouth daily.   furosemide (LASIX) 20 MG tablet Take 1 tablet (20 mg total) by mouth daily as needed (swelling).   inclisiran (LEQVIO) 284 MG/1.5ML SOSY injection Inject 1.24ml every 6 months   lamoTRIgine (LAMICTAL) 100 MG tablet Take 100 mg by mouth daily.   Liniments (BLUE-EMU SUPER STRENGTH EX) Apply 1 Application topically daily as needed (pain).   magnesium oxide (MAG-OX) 400 (240 Mg) MG  tablet Take 400 mg by mouth daily.   metoprolol tartrate (LOPRESSOR) 25 MG tablet Take 1.5 tablets (37.5 mg total) by mouth 2 (two) times daily.   Multiple Vitamins-Minerals (MULTIVITAMIN WITH MINERALS) tablet Take 1 tablet by mouth daily.   Omega 3 1000 MG CAPS Take 1,000 mg by mouth daily.   OVER THE COUNTER MEDICATION Nano CBD OIL: Pt takes 20 drops in the morning and 20 drops in the evening.   rosuvastatin (CRESTOR) 10 MG tablet Take 1 tablet (10 mg total) by mouth daily.   sildenafil (VIAGRA) 100 MG tablet Take 100 mg by mouth daily as needed for erectile  dysfunction.   zinc gluconate 50 MG tablet Take 50 mg by mouth daily.   No facility-administered encounter medications on file as of 04/18/2023.     Past Medical History:  Diagnosis Date   Acne    ADHD (attention deficit hyperactivity disorder)    Arthritis    Asthma    Bipolar 1 disorder (HCC)    Cancer (HCC)    basal cell cancer removed from back   Cataract    Coronary artery disease    Depression    Dyspnea    Dysrhythmia    PVCs   GERD (gastroesophageal reflux disease)    Heart murmur    Hepatitis    remote hx Hepatitis A (caused by food contaminant in childhood)   History of hiatal hernia    Hyperlipidemia    Hypertension    Mitral valve prolapse    Panic attacks    Peripheral vascular disease (HCC)    PFO (patent foramen ovale)    ? small PFO per echo   PONV (postoperative nausea and vomiting)    Pre-diabetes    Sleep apnea     Past Surgical History:  Procedure Laterality Date   APPLICATION OF WOUND VAC N/A 01/20/2023   Procedure: APPLICATION OF WOUND VAC;  Surgeon: Lovett Sox, MD;  Location: [facility] OR;  Service: Thoracic;  Laterality: N/A;   APPLICATION OF WOUND VAC N/A 01/23/2023   Procedure: APPLICATION OF WOUND VAC;  Surgeon: Peggye Form, DO;  Location: [facility];  Service: Plastics;  Laterality: N/A;   APPLICATION OF WOUND VAC N/A 01/29/2023   Procedure: APPLICATION OF WOUND VAC;  Surgeon: Peggye Form, DO;  Location: [facility];  Service: Plastics;  Laterality: N/A;   CORONARY ARTERY BYPASS GRAFT N/A 01/02/2023   Procedure: CORONARY ARTERY BYPASS GRAFTING (CABG) TIMES FOUR USING THE LEFT INTERNAL MAMMARY ARTERY (LIMA) AND BILATERAL GREATER SAPHENOUS VEIN ARTERIES;  Surgeon: [name], MD;  Location: [facility];  Service: Open Heart Surgery;  Laterality: N/A;  Open median sternotomy   CORONARY PRESSURE/FFR STUDY N/A 11/21/2022   Procedure: INTRAVASCULAR PRESSURE WIRE/FFR STUDY;  Surgeon: Orbie Pyo, MD;  Location: [facility];  Service:  Cardiovascular;  Laterality: N/A;   HYDROCELE EXCISION Bilateral 03/22/2022   Procedure: HYDROCELECTOMY ADULT;  Surgeon: Noel Christmas, MD;  Location: [facility];  Service: Urology;  Laterality: Bilateral;  2 HRS   INCISION AND DRAINAGE OF WOUND N/A 01/29/2023   Procedure: Sternal wound irrigation and debridement, placement of myriad and wound VAC change;  Surgeon: Peggye Form, DO;  Location: [facility];  Service: Plastics;  Laterality: N/A;   INCISION AND DRAINAGE OF WOUND N/A 01/31/2023   Procedure: IRRIGATION AND DEBRIDEMENT OF STERNAL WOUND;  Surgeon: Peggye Form, DO;  Location: [facility];  Service: Plastics;  Laterality: N/A;   LEFT HEART CATH AND  CORONARY ANGIOGRAPHY N/A 11/21/2022   Procedure: LEFT HEART CATH AND CORONARY ANGIOGRAPHY;  Surgeon: Orbie Pyo, MD;  Location: [facility];  Service: Cardiovascular;  Laterality: N/A;   MOHS SURGERY  1990   PECTORALIS FLAP Right 01/31/2023   Procedure: RIGHT PECTORALIS FLAP;  Surgeon: Peggye Form, DO;  Location: [facility];  Service: Plastics;  Laterality: Right;   STERNAL INCISION RECLOSURE N/A 01/07/2023   Procedure: STERNAL REWIRING;  Surgeon: [name], MD;  Location: [facility];  Service: Thoracic;  Laterality: N/A;   STERNAL WOUND DEBRIDEMENT N/A 01/20/2023   Procedure: DRAIN STERNAL INFECTION;  Surgeon: Lovett Sox, MD;  Location: [facility] OR;  Service: Thoracic;  Laterality: N/A;   STERNAL WOUND DEBRIDEMENT N/A 01/23/2023   Procedure: Excision of sternal wound with Myriad;  Surgeon: Peggye Form, DO;  Location: [facility];  Service: Plastics;  Laterality: N/A;   STRABISMUS SURGERY Left    TEE WITHOUT CARDIOVERSION N/A 01/02/2023   Procedure: TRANSESOPHAGEAL ECHOCARDIOGRAM;  Surgeon: [name], MD;  Location: [facility] OR;  Service: Open Heart Surgery;  Laterality: N/A;   TONSILLECTOMY AND ADENOIDECTOMY     WISDOM TOOTH EXTRACTION      Family History  Problem Relation Age of Onset   Heart disease Mother    Diabetes  Mother    Obesity Mother    Hyperlipidemia Mother    Cancer Father        skin cancer   Heart disease Father    Hyperlipidemia Father    Colon cancer Neg Hx    Esophageal cancer Neg Hx    Rectal cancer Neg Hx    Stomach cancer Neg Hx     Social History   Social History Narrative   Not on file     Review of Systems General: Denies fevers or chills Cardio: Denies chest pain Pulmonary: Denies difficulty breathing Skin: Denies ongoing drainage  Physical Exam    04/11/2023    8:32 AM 04/01/2023    2:13 PM 04/01/2023    1:28 PM  Vitals with BMI  Height 5\' 11"   5\' 11"   Weight 295 lbs 13 oz  299 lbs 13 oz  BMI 41.27  41.83  Systolic 130 130 960  Diastolic 82 68 70  Pulse 82  80    General:  No acute distress, nontoxic appearing  Respiratory: No increased work of breathing Neuro: Alert and oriented Psychiatric: Normal mood and affect  Skin: Less than 0.5 cm superficial abrasion over the middle part of his incision repair site.  No surrounding erythema or induration otherwise concerning for infection.  Mildly dry appearing.   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  Appears benign.  Area is nontender.  No crepitus or fluctuance.  No surrounding skin changes.  Recommending Vaseline given that it is mildly dry appearing.  This will help resolve his excoriation injury.  Follow-up only as needed.  Encouraged him to follow-up with CT surgery regarding his "grinding" sensation where wires had reportedly been placed.  Continue with sternal precautions until they state otherwise.  Evelena Leyden PA-C 04/18/2023, 12:55 PM

## 2023-04-18 ENCOUNTER — Encounter: Payer: Self-pay | Admitting: Physician Assistant

## 2023-04-18 ENCOUNTER — Ambulatory Visit (INDEPENDENT_AMBULATORY_CARE_PROVIDER_SITE_OTHER): Payer: Medicare Other | Admitting: Physician Assistant

## 2023-04-18 VITALS — BP 153/89 | HR 83

## 2023-04-18 DIAGNOSIS — T8132XD Disruption of internal operation (surgical) wound, not elsewhere classified, subsequent encounter: Secondary | ICD-10-CM | POA: Diagnosis not present

## 2023-04-23 ENCOUNTER — Other Ambulatory Visit: Payer: Self-pay | Admitting: *Deleted

## 2023-04-23 ENCOUNTER — Other Ambulatory Visit: Payer: Self-pay | Admitting: Surgery

## 2023-04-23 DIAGNOSIS — R931 Abnormal findings on diagnostic imaging of heart and coronary circulation: Secondary | ICD-10-CM

## 2023-04-23 DIAGNOSIS — Z951 Presence of aortocoronary bypass graft: Secondary | ICD-10-CM

## 2023-04-23 NOTE — Progress Notes (Signed)
Christina with [facility] contacted the office stating patient c/o sternal "popping" while at cardiac rehab orientation. Patient contacted. States he feels a "popping/grinding" sensation at the bottom half of his sternum. States it has been happening since early July. Bending over, turning a certain way causes it to occur. States he has been following sternal precautions and had not lifted greater than 3 lbs since surgery. Appt scheduled for patient to see Dr. Laneta Simmers next week with CT chest with contrast. Patient made aware of plan.

## 2023-04-24 MED ORDER — VALSARTAN 160 MG PO TABS: 160.0000 mg | ORAL_TABLET | Freq: Every day | ORAL | 3 refills | Status: DC

## 2023-04-25 ENCOUNTER — Telehealth: Payer: Self-pay | Admitting: Plastic Surgery

## 2023-04-25 NOTE — Telephone Encounter (Signed)
Pt called to speak with Gerre Pebbles. He says his incision is fully healed and he was calling to let you know everything looked great. He proceeded to say he will give a call back if anything changes, and he asked for a call back if you have any questions for him.

## 2023-04-25 NOTE — Telephone Encounter (Signed)
Thank you, Bryan Brock. That is excellent news. He can certainly call us back should he have any questions or concerns in the future.

## 2023-04-30 ENCOUNTER — Encounter: Payer: Self-pay | Admitting: Surgery

## 2023-04-30 ENCOUNTER — Ambulatory Visit: Payer: Medicare Other | Admitting: Surgery

## 2023-04-30 ENCOUNTER — Ambulatory Visit
Admission: RE | Admit: 2023-04-30 | Discharge: 2023-04-30 | Disposition: A | Discharge status: Home / Self Care | Payer: Medicare Other | Source: Ambulatory Visit | Attending: Surgery | Admitting: Surgery

## 2023-04-30 ENCOUNTER — Ambulatory Visit (INDEPENDENT_AMBULATORY_CARE_PROVIDER_SITE_OTHER): Payer: Medicare Other | Admitting: Surgery

## 2023-04-30 VITALS — BP 129/72 | HR 90 | Resp 20 | Ht 71.0 in | Wt 300.0 lb

## 2023-04-30 DIAGNOSIS — Z951 Presence of aortocoronary bypass graft: Secondary | ICD-10-CM

## 2023-04-30 DIAGNOSIS — R931 Abnormal findings on diagnostic imaging of heart and coronary circulation: Secondary | ICD-10-CM

## 2023-04-30 MED ORDER — IOPAMIDOL (ISOVUE-370) INJECTION 76%: 75.0000 mL | Freq: Once | INTRAVENOUS | Status: DC | PRN

## 2023-04-30 MED ORDER — IOPAMIDOL (ISOVUE-370) INJECTION 76%
75.0000 mL | Freq: Once | INTRAVENOUS | Status: AC | PRN
  Administered 2023-04-30: 75 mL via INTRAVENOUS

## 2023-04-30 NOTE — Progress Notes (Signed)
HPI:  Bryan Brock returns to see me today for reexamination of his chest status post coronary bypass graft surgery x 4 on 01/02/2023 followed by sternal dehiscence related to coughing and subsequent reconstruction with a right pectoralis muscle flap by Dr. Ulice Bold on 01/31/2023.  The upper portion of the sternum was intact and those sternal wires were not removed.  The left lower third of the sternum was broken into multiple pieces and had to be removed.  He also had fractures of the left costal cartilages adjacent to the area where the sternum was fractured.  He made a good recovery from all this and has been at home increasing his activity.  He had home physical therapy initially and now is starting cardiac rehab.  He reports noticing same clicking sensation in the left lower chest with certain motions.  It does not cause him any pain, just a weird sensation.  He feels well overall with improvement in his exertional tolerance and denies any shortness of breath or anginal type chest pain.  Current Outpatient Medications  Medication Sig Dispense Refill   albuterol (VENTOLIN HFA) 108 (90 Base) MCG/ACT inhaler Inhale 2 puffs into the lungs every 6 (six) hours as needed for wheezing or shortness of breath. 6.7 g 0   amLODipine (NORVASC) 5 MG tablet Take 1 tablet (5 mg total) by mouth daily. 90 tablet 3   APPLE CIDER VINEGAR PO Take 15 mLs by mouth every Monday, Wednesday, and Friday.     Ascorbic Acid (VITAMIN C) 1000 MG tablet Take 3,000 mg by mouth daily.     aspirin EC 81 MG tablet Take 1 tablet (81 mg total) by mouth daily.     calcium elemental as carbonate (TUMS ULTRA 1000) 400 MG chewable tablet Chew 2,000 mg by mouth 3 (three) times daily as needed for heartburn.     Cholecalciferol (DIALYVITE VITAMIN D 5000) 125 MCG (5000 UT) capsule Take 20,000 Units by mouth daily.     Coenzyme Q10 (COQ-10) 100 MG CAPS Take 100 mg by mouth daily.     docusate sodium (COLACE) 100 MG capsule Take 2 capsules (200  mg total) by mouth daily. (Patient taking differently: Take 200 mg by mouth as needed.) 30 capsule 0   Flaxseed, Linseed, (FLAX SEED OIL) 1000 MG CAPS Take 1,000 mg by mouth daily.     folic acid (FOLVITE) 800 MCG tablet Take 800 mcg by mouth daily.     furosemide (LASIX) 20 MG tablet Take 1 tablet (20 mg total) by mouth daily as needed (swelling). 30 tablet 3   inclisiran (LEQVIO) 284 MG/1.5ML SOSY injection Inject 1.74ml every 6 months     lamoTRIgine (LAMICTAL) 100 MG tablet Take 100 mg by mouth daily.     Liniments (BLUE-EMU SUPER STRENGTH EX) Apply 1 Application topically daily as needed (pain).     magnesium oxide (MAG-OX) 400 (240 Mg) MG tablet Take 400 mg by mouth daily.     metoprolol tartrate (LOPRESSOR) 25 MG tablet Take 1.5 tablets (37.5 mg total) by mouth 2 (two) times daily. 270 tablet 3   Multiple Vitamins-Minerals (MULTIVITAMIN WITH MINERALS) tablet Take 1 tablet by mouth daily.     Omega 3 1000 MG CAPS Take 1,000 mg by mouth daily.     OVER THE COUNTER MEDICATION Nano CBD OIL: Pt takes 20 drops in the morning and 20 drops in the evening.     rosuvastatin (CRESTOR) 10 MG tablet Take 1 tablet (10 mg total) by mouth daily.  sildenafil (VIAGRA) 100 MG tablet Take 100 mg by mouth daily as needed for erectile dysfunction.     valsartan (DIOVAN) 160 MG tablet Take 1 tablet (160 mg total) by mouth daily. 90 tablet 3   zinc gluconate 50 MG tablet Take 50 mg by mouth daily.     No current facility-administered medications for this visit.   Facility-Administered Medications Ordered in Other Visits  Medication Dose Route Frequency Provider Last Rate Last Admin   iopamidol (ISOVUE-370) 76 % injection 75 mL  75 mL Intravenous Once PRN [name], MD         Physical Exam: BP 129/72   Pulse 90   Resp 20   Ht 5\' 11"  (1.803 m)   Wt 300 lb (136.1 kg)   SpO2 95% Comment: RA  BMI 41.84 kg/m  He looks well. Cardiac exam shows regular rate and rhythm with normal heart  sounds. Lungs are clear. The chest incision is well-healed.  The upper portion of the sternum is stable.  There is minimal chest wall deformity from his reconstruction.  Diagnostic Tests:  Narrative & Impression  CLINICAL DATA:  Chest wall pain   EXAM: CT CHEST WITH CONTRAST   TECHNIQUE: Multidetector CT imaging of the chest was performed during intravenous contrast administration.   RADIATION DOSE REDUCTION: This exam was performed according to the departmental dose-optimization program which includes automated exposure control, adjustment of the mA and/or kV according to patient size and/or use of iterative reconstruction technique.   CONTRAST:  75mL ISOVUE-370 IOPAMIDOL (ISOVUE-370) INJECTION 76%   COMPARISON:  Multiple priors, most recent chest CT dated Jan 19, 2023   FINDINGS: Cardiovascular: Normal heart size. No pericardial effusion. Normal caliber thoracic aorta with mild atherosclerotic disease. Severe left main and three-vessel coronary artery calcifications status post CABG.   Mediastinum/Nodes: Small hiatal hernia. Thyroid is unremarkable. No enlarged lymph nodes seen in the chest.   Lungs/Pleura: Central airways are patent. Anterior left lung herniation between the anterior left second third ribs. Mild bilateral ground-glass opacities, most pronounced in the right lower lobe, likely atelectasis. Trace left pleural effusion. No pneumothorax.   Upper Abdomen: Gallstones.  No acute abnormality.   Musculoskeletal: Status post median sternotomy with intact sternal wires. Interval decreased displacement of the left rib cartilage from the lower sternal body when compared with the prior exam, maximum distance of 3.1 cm, previously 5.5 cm. Interval resolution of anterior mediastinal and left subpectoral fluid. Interval decreased size parasternal fluid collection with small residual fluid collection of located adjacent to the left lower sternum at the site of  dehiscence measuring 1.1 x 2.0 cm. Increased soft tissue stranding of the anterior right chest wall.   IMPRESSION: 1. Interval decreased displacement of the lower sternum from the left rib cartilage. Interval resolution of anterior mediastinal and left subpectoral fluid. Small residual fluid collection located adjacent to the left lower sternum measuring 1.1 x 2.0 cm. 2. Increased soft tissue stranding of the anterior right chest wall, likely postsurgical given history of right pectoralis major muscle flap. 3. New anterior left lung herniation between the anterior left second and third ribs. 4. Trace left pleural effusion. 5. Aortic Atherosclerosis (ICD10-I70.0).     Electronically Signed   By: Allegra Lai M.D.   On: 04/30/2023 14:58      Impression:  I have personally reviewed his CT scan.  I think everything looks good overall.  The upper portion of the sternum where the wires are still present is intact.  There is decreased displacement of the lower sternum from the left rib cartilages.  I suspect that his clicking or movement sensation is probably related to these costal cartilages which are free-floating anteriorly.  This may resolve with time but this may also be his new normal.  There is no sign of any significant fluid collections.  There is slight herniation of the left lung between the anterior left second and third ribs but I do not think this will cause any issue.  This is in an area where the intercostal muscle was separated from the rib at the time of his initial injury related to coughing.  I reviewed the CT images with him and his significant other and answered their questions.  Overall I think he is making a great recovery and I encouraged him to continue remaining physically active.  He is almost 3 months out from his reconstruction and I think it is okay for him to gradually increase his activity and weight lifting as tolerated.  I told him that he can go back to work  but should gradually get back into lifting heavier weights or doing more strenuous activity and if something hurts to stop doing it.  Plan:  He will return to see me if the need arises.  I spent 10 minutes performing this established patient evaluation and > 50% of this time was spent face to face counseling and coordinating the care of this patient's sternal reconstruction.   [name], MD [facility] [phone]

## 2023-05-02 ENCOUNTER — Ambulatory Visit: Payer: Medicare Other | Attending: Family Medicine

## 2023-05-02 DIAGNOSIS — E785 Hyperlipidemia, unspecified: Secondary | ICD-10-CM

## 2023-05-03 DIAGNOSIS — Z79899 Other long term (current) drug therapy: Secondary | ICD-10-CM

## 2023-05-04 LAB — HEPATIC FUNCTION PANEL
ALT: 32 IU/L (ref 0–44)
AST: 31 IU/L (ref 0–40)
Albumin: 4.4 g/dL (ref 3.9–4.9)
Alkaline Phosphatase: 158 IU/L — ABNORMAL HIGH (ref 44–121)
Bilirubin Total: 0.5 mg/dL (ref 0.0–1.2)
Bilirubin, Direct: 0.17 mg/dL (ref 0.00–0.40)
Total Protein: 6.6 g/dL (ref 6.0–8.5)

## 2023-05-04 LAB — LIPID PANEL
Chol/HDL Ratio: 1.9 ratio (ref 0.0–5.0)
Cholesterol, Total: 105 mg/dL (ref 100–199)
HDL: 56 mg/dL (ref >39)
LDL Chol Calc (NIH): 26 mg/dL (ref 0–99)
Triglycerides: 131 mg/dL (ref 0–149)
VLDL Cholesterol Cal: 23 mg/dL (ref 5–40)

## 2023-05-04 LAB — APOLIPOPROTEIN B: Apolipoprotein B: 46 mg/dL (ref <90)

## 2023-05-05 ENCOUNTER — Encounter: Payer: Self-pay | Admitting: Pharmacist

## 2023-05-06 ENCOUNTER — Encounter: Payer: Self-pay | Admitting: Family Medicine

## 2023-05-07 ENCOUNTER — Ambulatory Visit: Payer: Medicare Other | Attending: Nurse Practitioner

## 2023-05-07 DIAGNOSIS — Z79899 Other long term (current) drug therapy: Secondary | ICD-10-CM

## 2023-05-08 LAB — BASIC METABOLIC PANEL
BUN/Creatinine Ratio: 16 (ref 10–24)
BUN: 19 mg/dL (ref 8–27)
CO2: 23 mmol/L (ref 20–29)
Calcium: 10.4 mg/dL — ABNORMAL HIGH (ref 8.6–10.2)
Chloride: 99 mmol/L (ref 96–106)
Creatinine, Ser: 1.21 mg/dL (ref 0.76–1.27)
Glucose: 171 mg/dL — ABNORMAL HIGH (ref 70–99)
Potassium: 5 mmol/L (ref 3.5–5.2)
Sodium: 140 mmol/L (ref 134–144)
eGFR: 65 mL/min/{1.73_m2} (ref >59)

## 2023-05-09 ENCOUNTER — Encounter: Payer: Self-pay | Admitting: Family Medicine

## 2023-05-09 ENCOUNTER — Ambulatory Visit (INDEPENDENT_AMBULATORY_CARE_PROVIDER_SITE_OTHER): Payer: Medicare Other | Admitting: Family Medicine

## 2023-05-09 VITALS — HR 88 | Temp 98.8°F | Ht 71.0 in | Wt 299.6 lb

## 2023-05-09 DIAGNOSIS — I2510 Atherosclerotic heart disease of native coronary artery without angina pectoris: Secondary | ICD-10-CM | POA: Diagnosis not present

## 2023-05-09 DIAGNOSIS — R748 Abnormal levels of other serum enzymes: Secondary | ICD-10-CM

## 2023-05-09 DIAGNOSIS — I10 Essential (primary) hypertension: Secondary | ICD-10-CM

## 2023-05-09 DIAGNOSIS — R7303 Prediabetes: Secondary | ICD-10-CM

## 2023-05-09 DIAGNOSIS — E785 Hyperlipidemia, unspecified: Secondary | ICD-10-CM | POA: Diagnosis not present

## 2023-05-09 NOTE — Assessment & Plan Note (Signed)
Blood pressure is improving, but still a little high. Continue metoprolol tartrate 37.5 mg bid, amlodipine 5 mg daily, and valsartan 160 mg daily.

## 2023-05-09 NOTE — Assessment & Plan Note (Signed)
Last A1c was normal. I will repeat this to make sure there is no sign of diabetes.

## 2023-05-09 NOTE — Assessment & Plan Note (Signed)
Recent 4 vessel CABG. Continue aspirin 81 mg daily and metoprolol tartrate 37.5 mg bid.

## 2023-05-09 NOTE — Assessment & Plan Note (Signed)
I will check a GGT and alkaline phosphatase isoenzymes to determine further workup.

## 2023-05-09 NOTE — Assessment & Plan Note (Signed)
LDL cholesterol at goal. Continue rosuvastatin 10 mg daily, omega-3 fatty acids 1 gm daily, and inclisiran (Leqvio) injections every 3 months.

## 2023-05-09 NOTE — Progress Notes (Signed)
[facility] [address] [place] [address] Dept: [phone] Dept Fax: [phone]  Office Visit  Subjective:    Patient ID: Bryan Brock, male    DOB: 1955/08/28, 67 y.o..   MRN: [id number]  Chief Complaint  Patient presents with   Hypertension    3 Month f/u. Reviewing recent lab results (09/06), prescription leqvio Hypertension, Coronary Artery Disease discussed during last visit.  Average BP at home 121/68 -165/96.   History of Present Illness:  Patient is in today for reassessment of an issue with an elevated alkaline phosphatase. Cardiology ahd noted this on labs they obtained. He has had this previously as well, but has had no work-up for this. Mr. Claywell notes he has had a CT scan earlier this year that suggested her had some hepatic steatosis. He has had some episodic transaminase elevations as well.  Mr. Phares has a history of coronary artery disease, s/p 4-vessel CABG. He is managed with metoprolol tartrate 37.5 mg bid and lipid management. He is currently engaged in cardiac rehab.   Mr. Mehler has a history hypertension. He is currently managed on metoprolol tartrate 37.5 mg bid, amlodipine 5 mg daily, and valsartan 160 mg daily. He has noted his BP to be elevated at home. His 7-day average is 137/80.   Mr. Scherger has a history of hyperlipidemia. He is managed on rosuvastatin 10 mg daily, omega-3 fatty acids 1 gm daily, and inclisiran (Leqvio) injections every 6 months.  Past Medical History: Patient Active Problem List   Diagnosis Date Noted   Elevated alkaline phosphatase level 04/11/2023   Drug-induced constipation 02/09/2023   Chest wall pain 02/09/2023   Coronary artery disease 02/04/2023   Debility 02/04/2023   Postprocedural pneumothorax 01/19/2023   Loculated pleural effusion 01/19/2023   Acute respiratory failure with hypoxia (HCC) 01/19/2023   Atelectasis 01/13/2023   Sternal wound dehiscence  01/07/2023   S/P CABG x 4 01/02/2023   OSA (obstructive sleep apnea) 12/19/2021   Primary hypertension 10/29/2021   Prediabetes 10/17/2021   Morbid obesity with BMI of 40.0-44.9, adult (HCC) 10/17/2021   Bipolar 1 disorder, depressed (HCC) 05/11/2020   History of basal cell carcinoma 11/05/2007   Hyperlipidemia 11/05/2007   Premature ventricular contractions 11/05/2007   History of mitral valve prolapse 11/05/2007   Past Surgical History:  Procedure Laterality Date   APPLICATION OF WOUND VAC N/A 01/20/2023   Procedure: APPLICATION OF WOUND VAC;  Surgeon: Lovett Sox, MD;  [facility]: [facility];  Service: Thoracic;  Laterality: N/A;   APPLICATION OF WOUND VAC N/A 01/23/2023   Procedure: APPLICATION OF WOUND VAC;  Surgeon: Peggye Form, DO;  [facility]: [facility];  Service: Plastics;  Laterality: N/A;   APPLICATION OF WOUND VAC N/A 01/29/2023   Procedure: APPLICATION OF WOUND VAC;  Surgeon: Peggye Form, DO;  [facility];  Service: Plastics;  Laterality: N/A;   CORONARY ARTERY BYPASS GRAFT N/A 01/02/2023   Procedure: CORONARY ARTERY BYPASS GRAFTING (CABG) TIMES FOUR USING THE LEFT INTERNAL MAMMARY ARTERY (LIMA) AND BILATERAL GREATER SAPHENOUS VEIN ARTERIES;  Surgeon: [name], MD;  Location: [facility] OR;  Service: Open Heart Surgery;  Laterality: N/A;  Open median sternotomy   CORONARY PRESSURE/FFR STUDY N/A 11/21/2022   Procedure: INTRAVASCULAR PRESSURE WIRE/FFR STUDY;  Surgeon: Orbie Pyo, MD;  [facility];  Service: Cardiovascular;  Laterality: N/A;   HYDROCELE EXCISION Bilateral 03/22/2022   Procedure: HYDROCELECTOMY ADULT;  Surgeon: Noel Christmas, MD;  Location: [facility];  Service: Urology;  Laterality: Bilateral;  2 HRS   INCISION AND DRAINAGE OF WOUND N/A 01/29/2023   Procedure: Sternal wound irrigation and debridement, placement of myriad and wound VAC change;  Surgeon: Peggye Form, DO;  [facility]: [facility];  Service: Plastics;  Laterality: N/A;    INCISION AND DRAINAGE OF WOUND N/A 01/31/2023   Procedure: IRRIGATION AND DEBRIDEMENT OF STERNAL WOUND;  Surgeon: Peggye Form, DO;  [facility];  Service: Plastics;  Laterality: N/A;   LEFT HEART CATH AND CORONARY ANGIOGRAPHY N/A 11/21/2022   Procedure: LEFT HEART CATH AND CORONARY ANGIOGRAPHY;  Surgeon: Orbie Pyo, MD;  [facility];  Service: Cardiovascular;  Laterality: N/A;   MOHS SURGERY  1990   PECTORALIS FLAP Right 01/31/2023   Procedure: RIGHT PECTORALIS FLAP;  Surgeon: Peggye Form, DO;  [facility];  Service: Plastics;  Laterality: Right;   STERNAL INCISION RECLOSURE N/A 01/07/2023   Procedure: STERNAL REWIRING;  Surgeon: [name], MD;  [facility];  Service: Thoracic;  Laterality: N/A;   STERNAL WOUND DEBRIDEMENT N/A 01/20/2023   Procedure: DRAIN STERNAL INFECTION;  Surgeon: Lovett Sox, MD;  Location: [facility] OR;  Service: Thoracic;  Laterality: N/A;   STERNAL WOUND DEBRIDEMENT N/A 01/23/2023   Procedure: Excision of sternal wound with Myriad;  Surgeon: Peggye Form, DO;  [facility];  Service: Plastics;  Laterality: N/A;   STRABISMUS SURGERY Left    TEE WITHOUT CARDIOVERSION N/A 01/02/2023   Procedure: TRANSESOPHAGEAL ECHOCARDIOGRAM;  Surgeon: [name], MD;  Location: [facility] OR;  Service: Open Heart Surgery;  Laterality: N/A;   TONSILLECTOMY AND ADENOIDECTOMY     WISDOM TOOTH EXTRACTION     Family History  Problem Relation Age of Onset   Heart disease Mother    Diabetes Mother    Obesity Mother    Hyperlipidemia Mother    Cancer Father        skin cancer   Heart disease Father    Hyperlipidemia Father    Colon cancer Neg Hx    Esophageal cancer Neg Hx    Rectal cancer Neg Hx    Stomach cancer Neg Hx    Outpatient Medications Prior to Visit  Medication Sig Dispense Refill   albuterol (VENTOLIN HFA) 108 (90 Base) MCG/ACT inhaler Inhale 2 puffs into the lungs every 6 (six) hours as needed for wheezing or  shortness of breath. 6.7 g 0   amLODipine (NORVASC) 5 MG tablet Take 1 tablet (5 mg total) by mouth daily. 90 tablet 3   APPLE CIDER VINEGAR PO Take 15 mLs by mouth every Monday, Wednesday, and Friday.     Ascorbic Acid (VITAMIN C) 1000 MG tablet Take 3,000 mg by mouth daily.     aspirin EC 81 MG tablet Take 1 tablet (81 mg total) by mouth daily.     calcium elemental as carbonate (TUMS ULTRA 1000) 400 MG chewable tablet Chew 2,000 mg by mouth 3 (three) times daily as needed for heartburn.     Cholecalciferol (DIALYVITE VITAMIN D 5000) 125 MCG (5000 UT) capsule Take 20,000 Units by mouth daily.     Coenzyme Q10 (COQ-10) 100 MG CAPS Take 100 mg by mouth daily.     docusate sodium (COLACE) 100 MG capsule Take 2 capsules (200 mg total) by mouth daily. (Patient taking differently: Take 200 mg by mouth as needed.) 30 capsule 0   Flaxseed, Linseed, (FLAX SEED OIL) 1000 MG CAPS Take  1,000 mg by mouth daily.     folic acid (FOLVITE) 800 MCG tablet Take 800 mcg by mouth daily.     furosemide (LASIX) 20 MG tablet Take 1 tablet (20 mg total) by mouth daily as needed (swelling). 30 tablet 3   inclisiran (LEQVIO) 284 MG/1.5ML SOSY injection Inject 1.4ml every 6 months     lamoTRIgine (LAMICTAL) 100 MG tablet Take 100 mg by mouth daily.     Liniments (BLUE-EMU SUPER STRENGTH EX) Apply 1 Application topically daily as needed (pain).     magnesium oxide (MAG-OX) 400 (240 Mg) MG tablet Take 400 mg by mouth daily.     metoprolol tartrate (LOPRESSOR) 25 MG tablet Take 1.5 tablets (37.5 mg total) by mouth 2 (two) times daily. 270 tablet 3   Multiple Vitamins-Minerals (MULTIVITAMIN WITH MINERALS) tablet Take 1 tablet by mouth daily.     Omega 3 1000 MG CAPS Take 1,000 mg by mouth daily.     OVER THE COUNTER MEDICATION Nano CBD OIL: Pt takes 20 drops in the morning and 20 drops in the evening.     rosuvastatin (CRESTOR) 10 MG tablet Take 1 tablet (10 mg total) by mouth daily.     sildenafil (VIAGRA) 100 MG tablet  Take 100 mg by mouth daily as needed for erectile dysfunction.     valsartan (DIOVAN) 160 MG tablet Take 1 tablet (160 mg total) by mouth daily. 90 tablet 3   zinc gluconate 50 MG tablet Take 50 mg by mouth daily.     No facility-administered medications prior to visit.   Allergies  Allergen Reactions   Latex Dermatitis   Lisinopril Cough     Objective:   Today's Vitals   05/09/23 1345  Pulse: 88  Temp: 98.8 F (37.1 C)  TempSrc: Temporal  SpO2: 99%  Weight: 299 lb 9.6 oz (135.9 kg)  Height: 5\' 11"  (1.803 m)   Body mass index is 41.79 kg/m.   General: Well developed, well nourished. No acute distress. Psych: Alert and oriented. Normal mood and affect.  Health Maintenance Due  Topic Date Due   Hepatitis C Screening  Never done   Zoster Vaccines- Shingrix (1 of 2) Never done   Lab Results:    Latest Ref Rng & Units 05/07/2023   12:24 PM 05/02/2023    8:44 AM 03/18/2023    8:50 AM  CMP  Glucose 70 - 99 mg/dL 829     BUN 8 - 27 mg/dL 19     Creatinine 5.62 - 1.27 mg/dL 1.30     Sodium 865 - 784 mmol/L 140     Potassium 3.5 - 5.2 mmol/L 5.0     Chloride 96 - 106 mmol/L 99     CO2 20 - 29 mmol/L 23     Calcium 8.6 - 10.2 mg/dL 69.6     Total Protein 6.0 - 8.5 g/dL  6.6  6.7   Total Bilirubin 0.0 - 1.2 mg/dL  0.5  0.4   Alkaline Phos 44 - 121 IU/L  158  141   AST 0 - 40 IU/L  31  22   ALT 0 - 44 IU/L  32  25    Lab Results  Component Value Date   CHOL 105 05/02/2023   HDL 56 05/02/2023   LDLCALC 26 05/02/2023   TRIG 131 05/02/2023   CHOLHDL 1.9 05/02/2023     Imaging: CT of Chest wo contrast (04/30/2023) IMPRESSION: 1. Interval decreased displacement of the lower sternum from  the left rib cartilage. Interval resolution of anterior mediastinal and left subpectoral fluid. Small residual fluid collection located adjacent to the left lower sternum measuring 1.1 x 2.0 cm. 2. Increased soft tissue stranding of the anterior right chest wall, likely postsurgical given  history of right pectoralis major muscle flap. 3. New anterior left lung herniation between the anterior left second and third ribs. 4. Trace left pleural effusion. 5. Aortic Atherosclerosis (ICD10-I70.0).  Assessment & Plan:   Problem List Items Addressed This Visit       Cardiovascular and Mediastinum   Coronary artery disease    Recent 4 vessel CABG. Continue aspirin 81 mg daily and metoprolol tartrate 37.5 mg bid.      Primary hypertension    Blood pressure is improving, but still a little high. Continue metoprolol tartrate 37.5 mg bid, amlodipine 5 mg daily, and valsartan 160 mg daily.        Other   Elevated alkaline phosphatase level - Primary    I will check a GGT and alkaline phosphatase isoenzymes to determine further workup.      Relevant Orders   Alkaline phosphatase, isoenzymes   Gamma GT   Hyperlipidemia    LDL cholesterol at goal. Continue rosuvastatin 10 mg daily, omega-3 fatty acids 1 gm daily, and inclisiran (Leqvio) injections every 3 months.      Prediabetes    Last A1c was normal. I will repeat this to make sure there is no sign of diabetes.      Relevant Orders   Hemoglobin A1c    Return in about 3 months (around 08/08/2023) for Reassessment.   Loyola Mast, MD

## 2023-05-10 LAB — HEMOGLOBIN A1C
Hgb A1c MFr Bld: 6.8 %{Hb} — ABNORMAL HIGH (ref <5.7)
Mean Plasma Glucose: 148 mg/dL
eAG (mmol/L): 8.2 mmol/L

## 2023-05-10 LAB — GAMMA GT: GGT: 43 U/L (ref 3–70)

## 2023-05-12 ENCOUNTER — Encounter: Payer: Self-pay | Admitting: Family Medicine

## 2023-05-14 ENCOUNTER — Encounter: Payer: Self-pay | Admitting: Family Medicine

## 2023-05-14 ENCOUNTER — Ambulatory Visit (INDEPENDENT_AMBULATORY_CARE_PROVIDER_SITE_OTHER): Payer: Medicare Other | Admitting: Family Medicine

## 2023-05-14 VITALS — BP 134/74 | HR 90 | Temp 98.7°F | Ht 71.0 in | Wt 304.4 lb

## 2023-05-14 DIAGNOSIS — E1159 Type 2 diabetes mellitus with other circulatory complications: Secondary | ICD-10-CM | POA: Diagnosis not present

## 2023-05-14 DIAGNOSIS — Z7984 Long term (current) use of oral hypoglycemic drugs: Secondary | ICD-10-CM | POA: Diagnosis not present

## 2023-05-14 MED ORDER — METFORMIN HCL 500 MG PO TABS
500.0000 mg | ORAL_TABLET | Freq: Every day | ORAL | 3 refills | Status: DC
Start: 2023-05-14 — End: 2024-05-19

## 2023-05-14 MED ORDER — METFORMIN HCL 500 MG PO TABS
500.0000 mg | ORAL_TABLET | Freq: Two times a day (BID) | ORAL | 3 refills | Status: DC
Start: 2023-05-14 — End: 2023-05-14

## 2023-05-14 NOTE — Progress Notes (Signed)
[facility] [address] [place] [address] Dept: [phone] Dept Fax: [phone]  Office Visit  Subjective:    Patient ID: Bryan Brock, male    DOB: 12/27/1955, 67 y.o..   MRN: [id number]  Chief Complaint  Patient presents with   Follow-up    F/u to discuss results.    History of Present Illness:  Patient is in today to discuss recent lab testing confirming new onset diabetes. He had initially had an elevated A1c while in the hospital for his cardiac surgery and complications. however, after discharge, repeat testing had shown a normal A1c. At this point, he is back to gaining some weight. He is engaged now in cardiac rehab and he does want to commit to losing weight. He prefers not to be on insulin/injections and wants to avoid pricking his fingers.  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   OSA (obstructive sleep apnea) 12/19/2021   Primary hypertension 10/29/2021   Prediabetes 10/17/2021   Morbid obesity with BMI of 40.0-44.9, adult (HCC) 10/17/2021   Bipolar 1 disorder, depressed (HCC) 05/11/2020   History of basal cell carcinoma 11/05/2007   Hyperlipidemia 11/05/2007   Premature ventricular contractions 11/05/2007   History of mitral valve prolapse 11/05/2007   Past Surgical History:  Procedure Laterality Date   APPLICATION OF WOUND VAC N/A 01/20/2023   Procedure: APPLICATION OF WOUND VAC;  Surgeon:  Lovett Sox, MD;  [facility];  Service: Thoracic;  Laterality: N/A;   APPLICATION OF WOUND VAC N/A 01/23/2023   Procedure: APPLICATION OF WOUND VAC;  Surgeon: Peggye Form, DO;  [facility]: [facility];  Service: Plastics;  Laterality: N/A;   APPLICATION OF WOUND VAC N/A 01/29/2023   Procedure: APPLICATION OF WOUND VAC;  Surgeon: Peggye Form, DO;  [facility];  Service: Plastics;  Laterality: N/A;   CORONARY ARTERY BYPASS GRAFT N/A 01/02/2023   Procedure: CORONARY ARTERY BYPASS GRAFTING (CABG) TIMES FOUR USING THE LEFT INTERNAL MAMMARY ARTERY (LIMA) AND BILATERAL GREATER SAPHENOUS VEIN ARTERIES;  Surgeon: [name], MD;  [facility];  Service: Open Heart Surgery;  Laterality: N/A;  Open median sternotomy   CORONARY PRESSURE/FFR STUDY N/A 11/21/2022   Procedure: INTRAVASCULAR PRESSURE WIRE/FFR STUDY;  Surgeon: Orbie Pyo, MD;  [facility];  Service: Cardiovascular;  Laterality: N/A;   HYDROCELE EXCISION Bilateral 03/22/2022   Procedure: HYDROCELECTOMY ADULT;  Surgeon: Noel Christmas, MD;  Location: [facility];  Service: Urology;  Laterality: Bilateral;  2 HRS   INCISION AND DRAINAGE OF WOUND N/A 01/29/2023   Procedure: Sternal wound irrigation and debridement, placement of myriad and wound VAC change;  Surgeon: Peggye Form, DO;  [facility];  Service: Plastics;  Laterality: N/A;   INCISION AND DRAINAGE OF WOUND N/A 01/31/2023   Procedure: IRRIGATION AND DEBRIDEMENT OF STERNAL WOUND;  Surgeon: Peggye Form, DO;  [facility];  Service: Plastics;  Laterality: N/A;   LEFT HEART CATH AND CORONARY ANGIOGRAPHY N/A 11/21/2022  Procedure: LEFT HEART CATH AND CORONARY ANGIOGRAPHY;  Surgeon: Orbie Pyo, MD;  [facility];  Service: Cardiovascular;  Laterality: N/A;   MOHS SURGERY  1990   PECTORALIS FLAP Right 01/31/2023   Procedure: RIGHT PECTORALIS FLAP;  Surgeon: Peggye Form, DO;  [facility];  Service:  Plastics;  Laterality: Right;   STERNAL INCISION RECLOSURE N/A 01/07/2023   Procedure: STERNAL REWIRING;  Surgeon: [name], MD;  [facility];  Service: Thoracic;  Laterality: N/A;   STERNAL WOUND DEBRIDEMENT N/A 01/20/2023   Procedure: DRAIN STERNAL INFECTION;  Surgeon: Lovett Sox, MD;  Location: [facility] OR;  Service: Thoracic;  Laterality: N/A;   STERNAL WOUND DEBRIDEMENT N/A 01/23/2023   Procedure: Excision of sternal wound with Myriad;  Surgeon: Peggye Form, DO;  [facility];  Service: Plastics;  Laterality: N/A;   STRABISMUS SURGERY Left    TEE WITHOUT CARDIOVERSION N/A 01/02/2023   Procedure: TRANSESOPHAGEAL ECHOCARDIOGRAM;  Surgeon: [name], MD;  Location: [facility] OR;  Service: Open Heart Surgery;  Laterality: N/A;   TONSILLECTOMY AND ADENOIDECTOMY     WISDOM TOOTH EXTRACTION     Family History  Problem Relation Age of Onset   Heart disease Mother    Diabetes Mother    Obesity Mother    Hyperlipidemia Mother    Cancer Father        skin cancer   Heart disease Father    Hyperlipidemia Father    Colon cancer Neg Hx    Esophageal cancer Neg Hx    Rectal cancer Neg Hx    Stomach cancer Neg Hx    Outpatient Medications Prior to Visit  Medication Sig Dispense Refill   albuterol (VENTOLIN HFA) 108 (90 Base) MCG/ACT inhaler Inhale 2 puffs into the lungs every 6 (six) hours as needed for wheezing or shortness of breath. 6.7 g 0   amLODipine (NORVASC) 5 MG tablet Take 1 tablet (5 mg total) by mouth daily. 90 tablet 3   APPLE CIDER VINEGAR PO Take 15 mLs by mouth every Monday, Wednesday, and Friday.     Ascorbic Acid (VITAMIN C) 1000 MG tablet Take 3,000 mg by mouth daily.     aspirin EC 81 MG tablet Take 1 tablet (81 mg total) by mouth daily.     calcium elemental as carbonate (TUMS ULTRA 1000) 400 MG chewable tablet Chew 2,000 mg by mouth 3 (three) times daily as needed for heartburn.     Cholecalciferol (DIALYVITE VITAMIN D 5000) 125 MCG (5000 UT) capsule Take  20,000 Units by mouth daily.     Coenzyme Q10 (COQ-10) 100 MG CAPS Take 100 mg by mouth daily.     Flaxseed, Linseed, (FLAX SEED OIL) 1000 MG CAPS Take 1,000 mg by mouth daily.     folic acid (FOLVITE) 800 MCG tablet Take 800 mcg by mouth daily.     furosemide (LASIX) 20 MG tablet Take 1 tablet (20 mg total) by mouth daily as needed (swelling). 30 tablet 3   inclisiran (LEQVIO) 284 MG/1.5ML SOSY injection Inject 1.66ml every 6 months     lamoTRIgine (LAMICTAL) 100 MG tablet Take 100 mg by mouth daily.     Liniments (BLUE-EMU SUPER STRENGTH EX) Apply 1 Application topically daily as needed (pain).     magnesium oxide (MAG-OX) 400 (240 Mg) MG tablet Take 400 mg by mouth daily.     metoprolol tartrate (LOPRESSOR) 25 MG tablet Take 1.5 tablets (37.5 mg total) by mouth 2 (two)  times daily. 270 tablet 3   Multiple Vitamins-Minerals (MULTIVITAMIN WITH MINERALS) tablet Take 1 tablet by mouth daily.     Omega 3 1000 MG CAPS Take 1,000 mg by mouth daily.     OVER THE COUNTER MEDICATION Nano CBD OIL: Pt takes 20 drops in the morning and 20 drops in the evening.     rosuvastatin (CRESTOR) 10 MG tablet Take 1 tablet (10 mg total) by mouth daily.     sildenafil (VIAGRA) 100 MG tablet Take 100 mg by mouth daily as needed for erectile dysfunction.     valsartan (DIOVAN) 160 MG tablet Take 1 tablet (160 mg total) by mouth daily. 90 tablet 3   zinc gluconate 50 MG tablet Take 50 mg by mouth daily.     No facility-administered medications prior to visit.   Allergies  Allergen Reactions   Latex Dermatitis   Lisinopril Cough     Objective:   Today's Vitals   05/14/23 1340  BP: 134/74  Pulse: 90  Temp: 98.7 F (37.1 C)  TempSrc: Temporal  SpO2: 94%  Weight: (!) 304 lb 6.4 oz (138.1 kg)  Height: 5\' 11"  (1.803 m)   Body mass index is 42.46 kg/m.   General: Well developed, well nourished. No acute distress. Psych: Alert and oriented. Normal mood and affect.  Health Maintenance Due  Topic Date Due    FOOT EXAM  Never done   OPHTHALMOLOGY EXAM  Never done   Hepatitis C Screening  Never done   Lab Results Component Ref Range & Units 5 d ago (05/09/23) 2 mo ago (02/18/23) 4 mo ago (12/31/22) 1 yr ago (03/08/22) 1 yr ago (10/17/21) 3 yr ago (05/11/20)  Hgb A1c MFr Bld <5.7 % of total Hgb 6.8 High  5.4 R, CM 6.6 High  R, CM 6.4 High  R, CM 6.3 R, CM 6.0 R, CM   Component Ref Range & Units 7 d ago (05/07/23) 2 mo ago (02/18/23) 3 mo ago (02/10/23) 3 mo ago (02/05/23) 3 mo ago (02/04/23) 3 mo ago (02/01/23) 3 mo ago (01/31/23)  Glucose 70 - 99 mg/dL 914 High  782 High  956 High  CM 114 High  CM 115 High  CM 143 High  CM 157 High        Assessment & Plan:   Problem List Items Addressed This Visit       Cardiovascular and Mediastinum   Type 2 diabetes mellitus with cardiac complication [facility]) - Primary    Mr. Heaton does meet ADA criteria for diagnosis of diabetes. I reviewed basic pathophysiology of Type 2 diabetes with him. I will refer him for diabetes teaching. I do not feel that a HGM will be of benefit. Although I would prefer to have started him on a DPP-4 or SGLT2i, he notes he cannot afford the co-pays for these. We will start metformin instead. I will reassess him in 3 months.      Relevant Medications   metFORMIN (GLUCOPHAGE) 500 MG tablet   Other Relevant Orders   Amb Referral to Nutrition and Diabetic Education    Return for Follow-up as scheduled.   Loyola Mast, MD

## 2023-05-14 NOTE — Assessment & Plan Note (Addendum)
Bryan Brock does meet ADA criteria for diagnosis of diabetes. I reviewed basic pathophysiology of Type 2 diabetes with him. I will refer him for diabetes teaching. I do not feel that a HGM will be of benefit. Although I would prefer to have started him on a DPP-4 or SGLT2i, he notes he cannot afford the co-pays for these. We will start metformin instead. I will reassess him in 3 months.

## 2023-05-19 ENCOUNTER — Encounter: Payer: Self-pay | Admitting: Family Medicine

## 2023-05-21 IMAGING — US US SCROTUM
1 series · 13 of 25 positions shown · non-contrast
Comparison: None.

CLINICAL DATA: Two weeks of nonpainful scrotal mass

EXAM:
SCROTAL ULTRASOUND
DOPPLER ULTRASOUND OF THE TESTICLES
TECHNIQUE: Complete ultrasound examination of the testicles, epididymis, and
other scrotal structures was performed. Color and spectral Doppler
ultrasound were also utilized to evaluate blood flow to the
testicles.

[Series 1: us scrotum · 80 acquisitions, 13 frames shown]
[im 1/80]
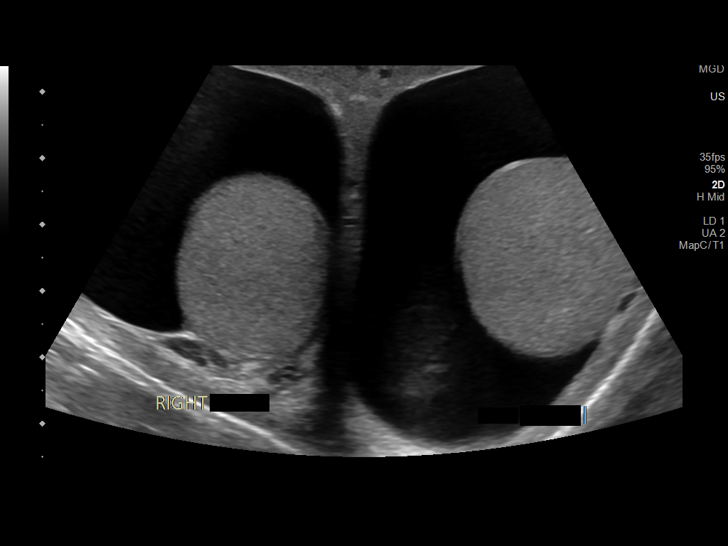
[im 7/80]
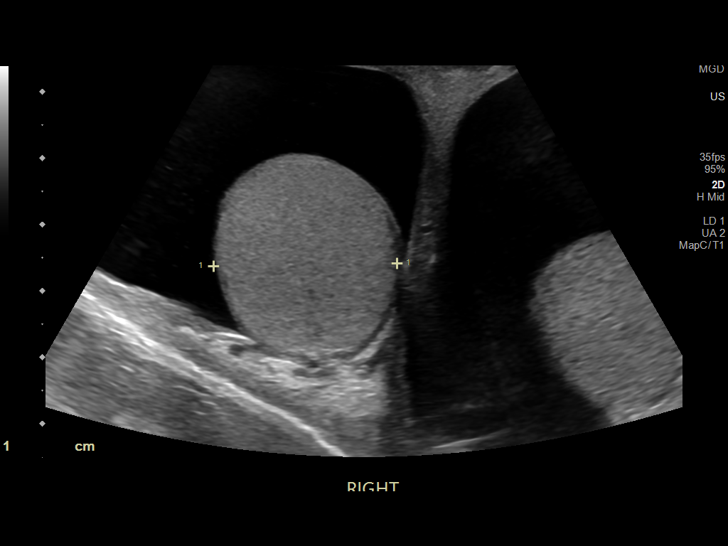
[im 14/80]
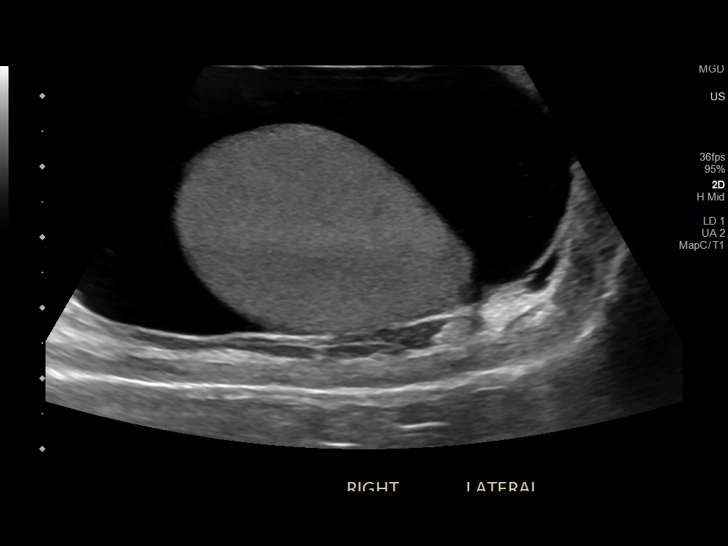
[im 20/80]
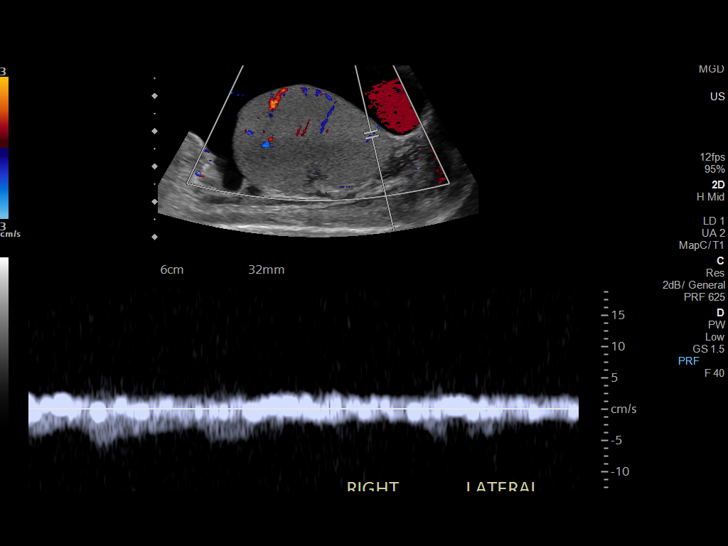
[im 27/80]
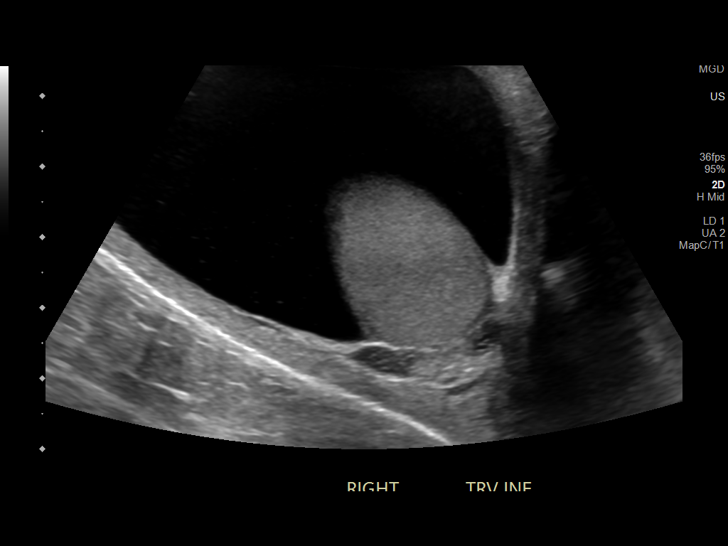
[im 33/80]
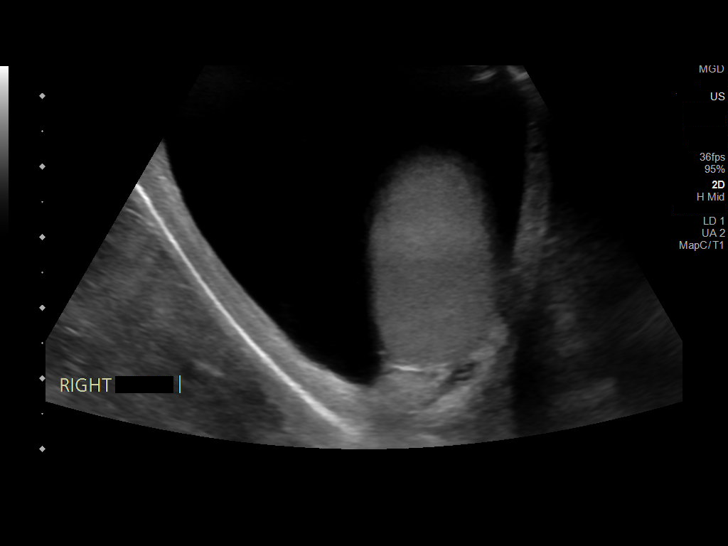
[im 40/80]
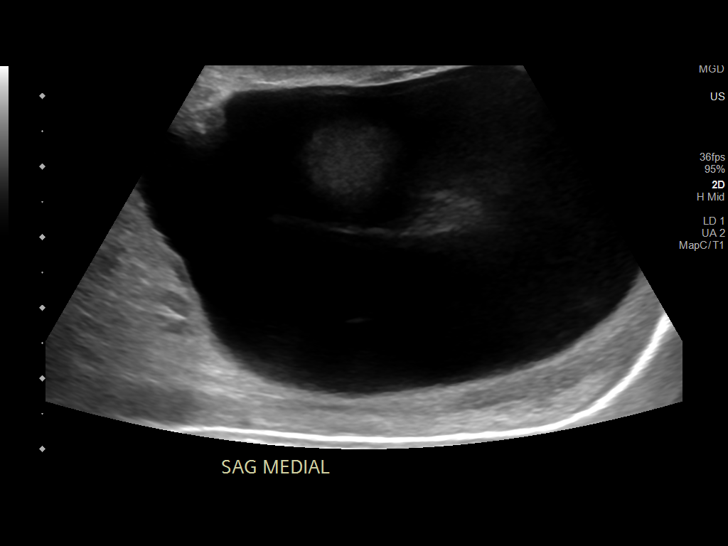
[im 47/80]
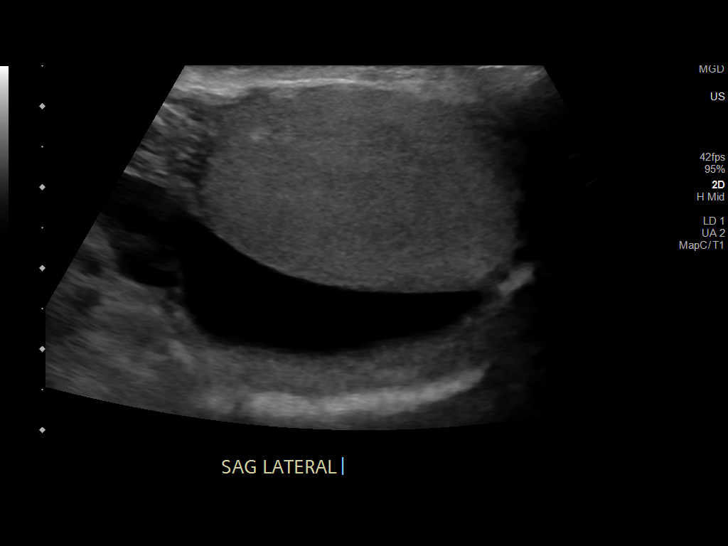
[im 53/80]
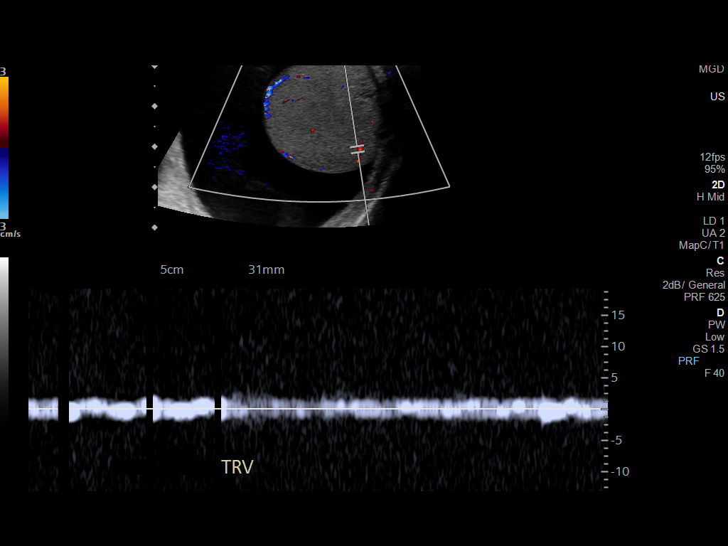
[im 60/80]
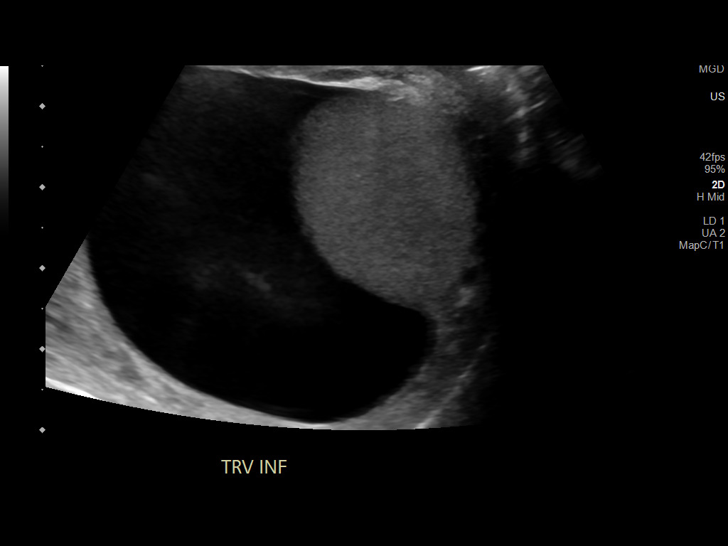
[im 66/80]
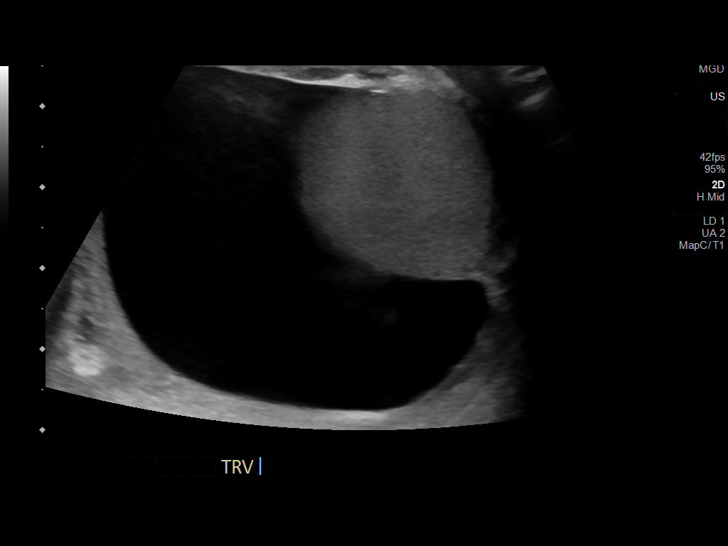
[im 73/80]
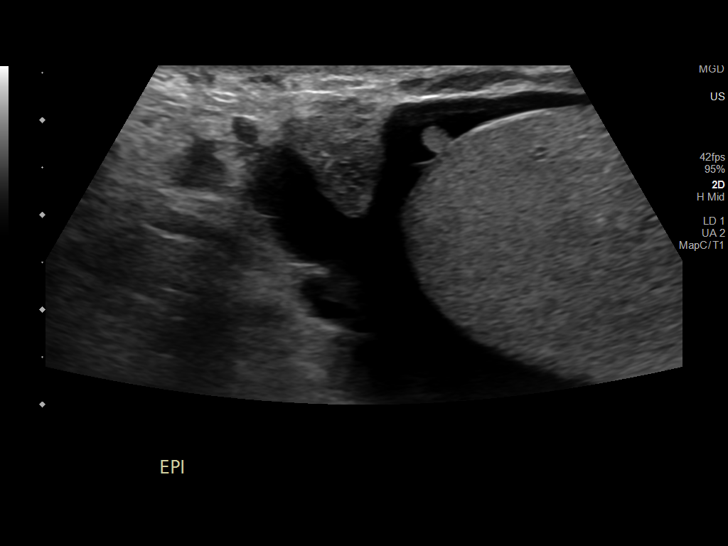
[im 80/80]
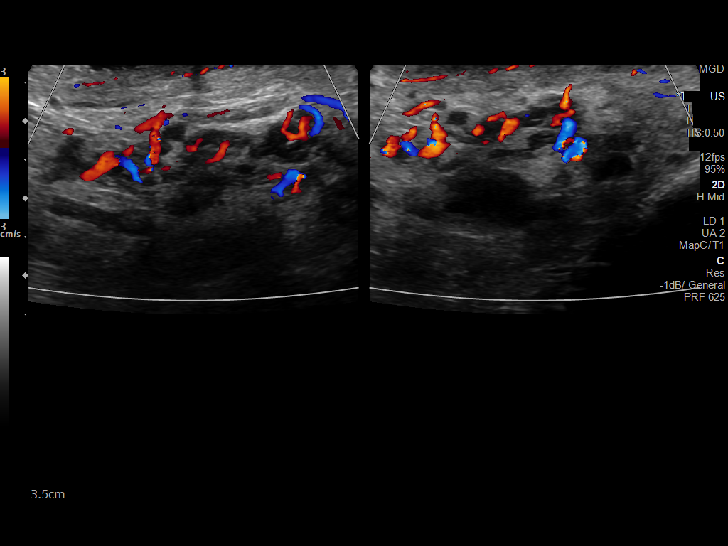

[13 of 25 positions shown; findings below may reference images not displayed]

FINDINGS: Right testicle

Measurements: 4.7 x 3.0 x 2.7 cm. No mass or microlithiasis
visualized.

Left testicle

Measurements: 4.5 x 2.8 x 2.8 cm. Within the mid testicle there is a
1.1 x 0.5 x 0.6 cm focal geographic area of slightly decreased
echogenicity with small central areas which appear anechoic. There
is no vascularity in this region. Findings are favored is tubular
ectasia of the rete testes.

Right epididymis:  Normal in size and appearance.

Left epididymis:  Normal in size and appearance.

Hydrocele: There are there are large bilateral simple appearing
hydroceles

Varicocele:  None visualized.

Pulsed Doppler interrogation of both testes demonstrates normal low
resistance arterial and venous waveforms bilaterally.
IMPRESSION: 1. Large bilateral simple hydroceles.
2. Small focal heterogeneous hypoechoic area measuring up to 1.1 cm
in the left testicle. Findings are favored as tubular ectasia of the
rete testes.

## 2023-05-22 ENCOUNTER — Other Ambulatory Visit: Payer: Medicare Other

## 2023-05-29 LAB — HM DIABETES EYE EXAM

## 2023-05-29 NOTE — Progress Notes (Signed)
Chief Complaint  Patient presents with   Follow-up    CAD   History of Present Illness: 67 yo male with history of CAD s/p 4V CABG, mitral valve prolapse, PVCs, HTN, HLD and obesity here today for cardiac follow up. He has been followed in our office by Dr. Shari Prows. He was diagnosed with mitral valve prolapse in 1990 in setting of syncope. He was seen by Dr. Shari Prows in 2023. Nuclear stress test in 2023 with no ischemia. Echo in 2023 with normal LV systolic function, mild LVH, no obvious mitral valve prolapse. He was found to have mild sleep apnea in 2023 but was not placed on CPAP. Coronary CTA March 2024 with multi-vessel CAD. Cardiac cath March 2024 with three vessel CAD. He underwent 4V CABG in May 2024 (LIMA to LAD, SVG to Diagonal, SVG to OM, SVG to PDA). He had a lengthy hospitalization following a hard cough that caused broken ribs, sternal fracture, and wound dehiscence. Was taken back to OR for rewiring and subsequent additional surgeries for wound healing and right pectoralis muscle flap. Was hospitalized total of 40 days. He was seen in our office on 04/01/23 by Eligha Bridegroom, NP and was doing well overall.   He is here today for follow up. The patient denies any chest pain, dyspnea, palpitations, lower extremity edema, orthopnea, PND, dizziness, near syncope or syncope. BP is elevated at home.   Primary Care Physician: Loyola Mast, MD   Past Medical History:  Diagnosis Date   Acne    ADHD (attention deficit hyperactivity disorder)    Arthritis    Asthma    Bipolar 1 disorder (HCC)    Cancer (HCC)    basal cell cancer removed from back   Cataract    Coronary artery disease    Depression    Dyspnea    Dysrhythmia    PVCs   GERD (gastroesophageal reflux disease)    Heart murmur    Hepatitis    remote hx Hepatitis A (caused by food contaminant in childhood)   History of hiatal hernia    Hyperlipidemia    Hypertension    Mitral valve prolapse    Panic attacks     Peripheral vascular disease (HCC)    PFO (patent foramen ovale)    ? small PFO per echo   PONV (postoperative nausea and vomiting)    Pre-diabetes    Sleep apnea     Past Surgical History:  Procedure Laterality Date   APPLICATION OF WOUND VAC N/A 01/20/2023   Procedure: APPLICATION OF WOUND VAC;  Surgeon: Lovett Sox, MD;  Location: [facility];  Service: Thoracic;  Laterality: N/A;   APPLICATION OF WOUND VAC N/A 01/23/2023   Procedure: APPLICATION OF WOUND VAC;  Surgeon: Peggye Form, DO;  Location: [facility];  Service: Plastics;  Laterality: N/A;   APPLICATION OF WOUND VAC N/A 01/29/2023   Procedure: APPLICATION OF WOUND VAC;  Surgeon: Peggye Form, DO;  Location: [facility];  Service: Plastics;  Laterality: N/A;   CORONARY ARTERY BYPASS GRAFT N/A 01/02/2023   Procedure: CORONARY ARTERY BYPASS GRAFTING (CABG) TIMES FOUR USING THE LEFT INTERNAL MAMMARY ARTERY (LIMA) AND BILATERAL GREATER SAPHENOUS VEIN ARTERIES;  Surgeon: [name], MD;  Location: [facility];  Service: Open Heart Surgery;  Laterality: N/A;  Open median sternotomy   CORONARY PRESSURE/FFR STUDY N/A 11/21/2022   Procedure: INTRAVASCULAR PRESSURE WIRE/FFR STUDY;  Surgeon: Orbie Pyo, MD;  Location: [facility];  Service: Cardiovascular;  Laterality: N/A;   HYDROCELE EXCISION Bilateral 03/22/2022   Procedure: HYDROCELECTOMY ADULT;  Surgeon: Noel Christmas, MD;  Location: [facility];  Service: Urology;  Laterality: Bilateral;  2 HRS   INCISION AND DRAINAGE OF WOUND N/A 01/29/2023   Procedure: Sternal wound irrigation and debridement, placement of myriad and wound VAC change;  Surgeon: Peggye Form, DO;  Location: [facility];  Service: Plastics;  Laterality: N/A;   INCISION AND DRAINAGE OF WOUND N/A 01/31/2023   Procedure: IRRIGATION AND DEBRIDEMENT OF STERNAL WOUND;  Surgeon: Peggye Form, DO;  Location: [facility];  Service: Plastics;  Laterality: N/A;   LEFT HEART CATH AND CORONARY ANGIOGRAPHY N/A 11/21/2022    Procedure: LEFT HEART CATH AND CORONARY ANGIOGRAPHY;  Surgeon: Orbie Pyo, MD;  [facility]: [facility];  Service: Cardiovascular;  Laterality: N/A;   MOHS SURGERY  1990   PECTORALIS FLAP Right 01/31/2023   Procedure: RIGHT PECTORALIS FLAP;  Surgeon: Peggye Form, DO;  Location: [facility];  Service: Plastics;  Laterality: Right;   STERNAL INCISION RECLOSURE N/A 01/07/2023   Procedure: STERNAL REWIRING;  Surgeon: [name], MD;  Location: [facility];  Service: Thoracic;  Laterality: N/A;   STERNAL WOUND DEBRIDEMENT N/A 01/20/2023   Procedure: DRAIN STERNAL INFECTION;  Surgeon: Lovett Sox, MD;  Location: [facility] OR;  Service: Thoracic;  Laterality: N/A;   STERNAL WOUND DEBRIDEMENT N/A 01/23/2023   Procedure: Excision of sternal wound with Myriad;  Surgeon: Peggye Form, DO;  Location: [facility];  Service: Plastics;  Laterality: N/A;   STRABISMUS SURGERY Left    TEE WITHOUT CARDIOVERSION N/A 01/02/2023   Procedure: TRANSESOPHAGEAL ECHOCARDIOGRAM;  Surgeon: [name], MD;  Location: [facility] OR;  Service: Open Heart Surgery;  Laterality: N/A;   TONSILLECTOMY AND ADENOIDECTOMY     WISDOM TOOTH EXTRACTION      Current Outpatient Medications  Medication Sig Dispense Refill   albuterol (VENTOLIN HFA) 108 (90 Base) MCG/ACT inhaler Inhale 2 puffs into the lungs every 6 (six) hours as needed for wheezing or shortness of breath. 6.7 g 0   amLODipine (NORVASC) 10 MG tablet Take 1 tablet (10 mg total) by mouth daily. 90 tablet 3   APPLE CIDER VINEGAR PO Take 15 mLs by mouth every Monday, Wednesday, and Friday.     Ascorbic Acid (VITAMIN C) 1000 MG tablet Take 3,000 mg by mouth daily.     aspirin EC 81 MG tablet Take 1 tablet (81 mg total) by mouth daily.     calcium elemental as carbonate (TUMS ULTRA 1000) 400 MG chewable tablet Chew 2,000 mg by mouth 3 (three) times daily as needed for heartburn.     Cholecalciferol (DIALYVITE VITAMIN D 5000) 125 MCG (5000 UT) capsule Take 20,000 Units by  mouth daily.     Coenzyme Q10 (COQ-10) 100 MG CAPS Take 100 mg by mouth daily.     Flaxseed, Linseed, (FLAX SEED OIL) 1000 MG CAPS Take 1,000 mg by mouth daily.     folic acid (FOLVITE) 800 MCG tablet Take 800 mcg by mouth daily.     furosemide (LASIX) 20 MG tablet Take 1 tablet (20 mg total) by mouth daily as needed (swelling). 30 tablet 3   inclisiran (LEQVIO) 284 MG/1.5ML SOSY injection Inject 1.22ml every 6 months     lamoTRIgine (LAMICTAL) 100 MG tablet Take 100 mg by mouth daily.     Liniments (BLUE-EMU SUPER STRENGTH EX) Apply 1 Application topically daily as needed (pain).  magnesium oxide (MAG-OX) 400 (240 Mg) MG tablet Take 400 mg by mouth daily.     metFORMIN (GLUCOPHAGE) 500 MG tablet Take 1 tablet (500 mg total) by mouth daily with breakfast. 90 tablet 3   metoprolol tartrate (LOPRESSOR) 25 MG tablet Take 1.5 tablets (37.5 mg total) by mouth 2 (two) times daily. 270 tablet 3   Multiple Vitamins-Minerals (MULTIVITAMIN WITH MINERALS) tablet Take 1 tablet by mouth daily.     Omega 3 1000 MG CAPS Take 1,000 mg by mouth daily.     OVER THE COUNTER MEDICATION Nano CBD OIL: Pt takes 20 drops in the morning and 20 drops in the evening.     rosuvastatin (CRESTOR) 10 MG tablet Take 1 tablet (10 mg total) by mouth daily.     sildenafil (VIAGRA) 100 MG tablet Take 100 mg by mouth daily as needed for erectile dysfunction.     valsartan (DIOVAN) 160 MG tablet Take 1 tablet (160 mg total) by mouth daily. 90 tablet 3   zinc gluconate 50 MG tablet Take 50 mg by mouth daily.     No current facility-administered medications for this visit.    Allergies  Allergen Reactions   Latex Dermatitis   Lisinopril Cough    Social History   Socioeconomic History   Marital status: Media planner    Spouse name: Not on file   Number of children: 2   Years of education: Not on file   Highest education level: Bachelor's degree (e.g., BA, AB, BS)  Occupational History   Occupation: Youth worker    Comment: Part-Time  Tobacco Use   Smoking status: Never   Smokeless tobacco: Current    Types: Snuff  Vaping Use   Vaping status: Never Used  Substance and Sexual Activity   Alcohol use: Yes    Alcohol/week: 1.0 - 2.0 standard drink of alcohol    Types: 1 - 2 Standard drinks or equivalent per week   Drug use: Never   Sexual activity: Yes  Other Topics Concern   Not on file  Social History Narrative   Not on file   Social Determinants of Health   Financial Resource Strain: Low Risk  (03/06/2023)   Overall Financial Resource Strain (CARDIA)    Difficulty of Paying Living Expenses: Not hard at all  Food Insecurity: No Food Insecurity (03/06/2023)   Hunger Vital Sign    Worried About Running Out of Food in the Last Year: Never true    Ran Out of Food in the Last Year: Never true  Transportation Needs: No Transportation Needs (03/06/2023)   PRAPARE - Administrator, Civil Service (Medical): No    Lack of Transportation (Non-Medical): No  Physical Activity: Insufficiently Active (03/06/2023)   Exercise Vital Sign    Days of Exercise per Week: 2 days    Minutes of Exercise per Session: 10 min  Stress: No Stress Concern Present (03/06/2023)   [employer] of Occupational Health - Occupational Stress Questionnaire    Feeling of Stress : Not at all  Social Connections: Unknown (04/10/2023)   Received from [facility]   Social Network    Social Network: Not on file  Intimate Partner Violence: Unknown (04/10/2023)   Received from [facility]   HITS    Physically Hurt: Not on file    Insult or Talk Down To: Not on file    Threaten Physical Harm: Not on file    Scream or Curse: Not on file  Family History  Problem Relation Age of Onset   Heart disease Mother    Diabetes Mother    Obesity Mother    Hyperlipidemia Mother    Cancer Father        skin cancer   Heart disease Father    Hyperlipidemia Father    Colon cancer Neg Hx    Esophageal  cancer Neg Hx    Rectal cancer Neg Hx    Stomach cancer Neg Hx     Review of Systems:  As stated in the HPI and otherwise negative.   Pulse 84   Ht 5\' 11"  (1.803 m)   Wt (!) 137.5 kg   SpO2 93%   BMI 42.29 kg/m   Physical Examination: General: Well developed, well nourished, NAD  HEENT: OP clear, mucus membranes moist  SKIN: warm, dry. No rashes. Neuro: No focal deficits  Musculoskeletal: Muscle strength 5/5 all ext  Psychiatric: Mood and affect normal  Neck: No JVD, no carotid bruits, no thyromegaly, no lymphadenopathy.  Lungs:Clear bilaterally, no wheezes, rhonci, crackles Cardiovascular: Regular rate and rhythm. No murmurs, gallops or rubs. Abdomen:Soft. Bowel sounds present. Non-tender.  Extremities: No lower extremity edema. Pulses are 2 + in the bilateral DP/PT.  EKG:  EKG is not ordered today. The ekg ordered today demonstrates   Recent Labs: 01/24/2023: Magnesium 2.0 02/18/2023: Hemoglobin 11.6; Platelets 313.0 05/02/2023: ALT 32 05/07/2023: BUN 19; Creatinine, Ser 1.21; Potassium 5.0; Sodium 140   Lipid Panel    Component Value Date/Time   CHOL 105 05/02/2023 0844   TRIG 131 05/02/2023 0844   HDL 56 05/02/2023 0844   CHOLHDL 1.9 05/02/2023 0844   CHOLHDL 4 10/17/2021 1705   VLDL 22.2 10/17/2021 1705   LDLCALC 26 05/02/2023 0844     Wt Readings from Last 3 Encounters:  05/30/23 (!) 137.5 kg  05/14/23 (!) 138.1 kg  05/09/23 135.9 kg    Assessment and Plan:   1. CAD s/p CABG without angina: No chest pain suggestive of angina. Continue ASA, statin and beta blocker.   2. Post-op atrial fibrillation: Sinus today on exam. Continue metoprolol.   3. HTN: BP is elevated at home. I will increase Norvasc to 10 mg daily. Continue Valsartan. Note intolerance to Lisinopril due to cough  4. Hyperlipidemia: LDL 26 on 05/02/23. Continue Leqvio and Crestor  Labs/ tests ordered today include:  No orders of the defined types were placed in this encounter.  Disposition:    F/U with me in 12 months  Signed, Verne Carrow, MD, [facility] 05/30/2023 9:48 AM    [facility] [address] [place], [place], [place]  [address] Phone: [phone]; Fax: [phone]

## 2023-05-30 ENCOUNTER — Encounter: Payer: Self-pay | Admitting: Cardiovascular Disease

## 2023-05-30 ENCOUNTER — Ambulatory Visit: Payer: Medicare Other | Attending: Cardiovascular Disease | Admitting: Cardiovascular Disease

## 2023-05-30 VITALS — HR 84 | Ht 71.0 in | Wt 303.2 lb

## 2023-05-30 DIAGNOSIS — I2510 Atherosclerotic heart disease of native coronary artery without angina pectoris: Secondary | ICD-10-CM | POA: Diagnosis not present

## 2023-05-30 DIAGNOSIS — I10 Essential (primary) hypertension: Secondary | ICD-10-CM | POA: Diagnosis present

## 2023-05-30 DIAGNOSIS — E785 Hyperlipidemia, unspecified: Secondary | ICD-10-CM | POA: Diagnosis present

## 2023-05-30 MED ORDER — AMLODIPINE BESYLATE 10 MG PO TABS: 10.0000 mg | ORAL_TABLET | Freq: Every day | ORAL | 3 refills | Status: DC

## 2023-05-30 NOTE — Patient Instructions (Signed)
Medication Instructions:  Your physician has recommended you make the following change in your medication:  1.) increase amlodipine (Norvasc) to 10 mg daily  *If you need a refill on your cardiac medications before your next appointment, please call your pharmacy*   Lab Work: none   Testing/Procedures: none   Follow-Up: At [facility], you and your health needs are our priority.  As part of our continuing mission to provide you with exceptional heart care, we have created designated Provider Care Teams.  These Care Teams include your primary Cardiologist (physician) and Advanced Practice Providers (APPs -  Physician Assistants and Nurse Practitioners) who all work together to provide you with the care you need, when you need it.   Your next appointment:   12 month(s)  Provider:   Verne Carrow, MD

## 2023-06-03 ENCOUNTER — Ambulatory Visit: Payer: Medicare Other | Admitting: Physician Assistant

## 2023-06-16 ENCOUNTER — Encounter: Payer: Medicare Other | Attending: Family Medicine | Admitting: Dietician

## 2023-06-16 DIAGNOSIS — E1159 Type 2 diabetes mellitus with other circulatory complications: Secondary | ICD-10-CM | POA: Diagnosis present

## 2023-06-16 NOTE — Patient Instructions (Signed)
1-Try the plate planner    1/2 plate of non starchy vegetables, 1/4 protein, 1/4 starchy 2-Great Job with beverages selections 3-Great Job with your physical activity  30-60 minutes 5-7 days weekly as tolerated

## 2023-06-16 NOTE — Progress Notes (Signed)
Diabetes Self-Management Education  Visit Type: First/Initial  Appt. Start Time: 1401 Appt. End Time: 1512  06/16/2023  Mr. Bryan Brock, identified by name and date of birth, is a 67 y.o. male with a diagnosis of Diabetes: Type 2.   ASSESSMENT   Patient is here today alone. Patient would like to learn about diabetes food plan and desires weight reductions. Pt reports he is currently in cardiac rehab 3 days weekly for 1 hour stating weight restrictions with lifting currently. Patient lives with life partner. They share shopping and life partner does the cooking. Pt reports following a heart healthy meal pattern that he received from cardiac rehab RD.  History includes:   Past Medical History:  Diagnosis Date   Acne    ADHD (attention deficit hyperactivity disorder)    Arthritis    Asthma    Bipolar 1 disorder (HCC)    Cancer (HCC)    basal cell cancer removed from back   Cataract    Coronary artery disease    Depression    Dyspnea    Dysrhythmia    PVCs   GERD (gastroesophageal reflux disease)    Heart murmur    Hepatitis    remote hx Hepatitis A (caused by food contaminant in childhood)   History of hiatal hernia    Hyperlipidemia    Hypertension    Mitral valve prolapse    Panic attacks    Peripheral vascular disease (HCC)    PFO (patent foramen ovale)    ? small PFO per echo   PONV (postoperative nausea and vomiting)    Pre-diabetes    Sleep apnea     Labs noted:   Lab Results  Component Value Date   HGBA1C 6.8 (H) 05/09/2023   Lab Results  Component Value Date   CHOL 105 05/02/2023   HDL 56 05/02/2023   LDLCALC 26 05/02/2023   TRIG 131 05/02/2023   CHOLHDL 1.9 05/02/2023   BP Readings from Last 3 Encounters:  05/14/23 134/74  04/30/23 129/72  04/18/23 (!) 153/89    Medications include:  Current Outpatient Medications:    albuterol (VENTOLIN HFA) 108 (90 Base) MCG/ACT inhaler, Inhale 2 puffs into the lungs every 6 (six) hours as needed for  wheezing or shortness of breath., Disp: 6.7 g, Rfl: 0   amLODipine (NORVASC) 10 MG tablet, Take 1 tablet (10 mg total) by mouth daily., Disp: 90 tablet, Rfl: 3   APPLE CIDER VINEGAR PO, Take 15 mLs by mouth every Monday, Wednesday, and Friday., Disp: , Rfl:    Ascorbic Acid (VITAMIN C) 1000 MG tablet, Take 3,000 mg by mouth daily., Disp: , Rfl:    aspirin EC 81 MG tablet, Take 1 tablet (81 mg total) by mouth daily., Disp: , Rfl:    calcium elemental as carbonate (TUMS ULTRA 1000) 400 MG chewable tablet, Chew 2,000 mg by mouth 3 (three) times daily as needed for heartburn., Disp: , Rfl:    Cholecalciferol (DIALYVITE VITAMIN D 5000) 125 MCG (5000 UT) capsule, Take 20,000 Units by mouth daily., Disp: , Rfl:    Coenzyme Q10 (COQ-10) 100 MG CAPS, Take 100 mg by mouth daily., Disp: , Rfl:    Flaxseed, Linseed, (FLAX SEED OIL) 1000 MG CAPS, Take 1,000 mg by mouth daily., Disp: , Rfl:    folic acid (FOLVITE) 800 MCG tablet, Take 800 mcg by mouth daily., Disp: , Rfl:    furosemide (LASIX) 20 MG tablet, Take 1 tablet (20 mg total) by mouth daily as  needed (swelling)., Disp: 30 tablet, Rfl: 3   inclisiran (LEQVIO) 284 MG/1.5ML SOSY injection, Inject 1.2ml every 6 months, Disp: , Rfl:    lamoTRIgine (LAMICTAL) 100 MG tablet, Take 100 mg by mouth daily., Disp: , Rfl:    Liniments (BLUE-EMU SUPER STRENGTH EX), Apply 1 Application topically daily as needed (pain)., Disp: , Rfl:    magnesium oxide (MAG-OX) 400 (240 Mg) MG tablet, Take 400 mg by mouth daily., Disp: , Rfl:    metFORMIN (GLUCOPHAGE) 500 MG tablet, Take 1 tablet (500 mg total) by mouth daily with breakfast., Disp: 90 tablet, Rfl: 3   metoprolol tartrate (LOPRESSOR) 25 MG tablet, Take 1.5 tablets (37.5 mg total) by mouth 2 (two) times daily., Disp: 270 tablet, Rfl: 3   Multiple Vitamins-Minerals (MULTIVITAMIN WITH MINERALS) tablet, Take 1 tablet by mouth daily., Disp: , Rfl:    Omega 3 1000 MG CAPS, Take 1,000 mg by mouth daily., Disp: , Rfl:    OVER  THE COUNTER MEDICATION, Nano CBD OIL: Pt takes 20 drops in the morning and 20 drops in the evening., Disp: , Rfl:    rosuvastatin (CRESTOR) 10 MG tablet, Take 1 tablet (10 mg total) by mouth daily., Disp: , Rfl:    sildenafil (VIAGRA) 100 MG tablet, Take 100 mg by mouth daily as needed for erectile dysfunction., Disp: , Rfl:    valsartan (DIOVAN) 160 MG tablet, Take 1 tablet (160 mg total) by mouth daily., Disp: 90 tablet, Rfl: 3   zinc gluconate 50 MG tablet, Take 50 mg by mouth daily., Disp: , Rfl:      There were no vitals taken for this visit. There is no height or weight on file to calculate BMI.   Diabetes Self-Management Education - 06/16/23 1410       Visit Information   Visit Type First/Initial      Initial Visit   Diabetes Type Type 2    Date Diagnosed May 2024    Are you currently following a meal plan? Yes    What type of meal plan do you follow? heart healthy    Are you taking your medications as prescribed? Yes      Health Coping   How would you rate your overall health? Good      Psychosocial Assessment   Patient Belief/Attitude about Diabetes Motivated to manage diabetes    What is the hardest part about your diabetes right now, causing you the most concern, or is the most worrisome to you about your diabetes?   Making healty food and beverage choices    Self-care barriers None    Self-management support Doctor's office    Other persons present Patient    Patient Concerns Nutrition/Meal planning;Monitoring;Healthy Lifestyle;Weight Control    Special Needs None    Preferred Learning Style No preference indicated    Learning Readiness Change in progress    How often do you need to have someone help you when you read instructions, pamphlets, or other written materials from your doctor or pharmacy? 1 - Never    What is the last grade level you completed in school? college      Pre-Education Assessment   Patient understands the diabetes disease and treatment  process. Needs Instruction    Patient understands incorporating nutritional management into lifestyle. Needs Instruction    Patient undertands incorporating physical activity into lifestyle. Needs Instruction    Patient understands using medications safely. Needs Instruction    Patient understands monitoring blood glucose, interpreting and using  results Needs Instruction    Patient understands prevention, detection, and treatment of acute complications. Needs Instruction    Patient understands prevention, detection, and treatment of chronic complications. Needs Instruction    Patient understands how to develop strategies to address psychosocial issues. Needs Instruction    Patient understands how to develop strategies to promote health/change behavior. Needs Instruction      Complications   Last HgB A1C per patient/outside source 6.8 %    How often do you check your blood sugar? 0 times/day (not testing)    Have you had a dilated eye exam in the past 12 months? Yes    Have you had a dental exam in the past 12 months? Yes    Are you checking your feet? No      Dietary Intake   Breakfast 2-3 eggs, [place] sausage made in olive oil sometimes cheese toast on seed tastic bread, coffee plain    Lunch OWYN protein powder, handful frozen fruit, water    Snack (afternoon) [place] stick or low fat cheese stick or fruit or greek yogurt with fruit    Dinner salad greens, tomato, onions, sunflower seeds, hamburger with feta    Beverage(s) whiskey1.5 ox*2, water      Activity / Exercise   Activity / Exercise Type Other (comment)   cardiac rehab   How many days per week do you exercise? 3    How many minutes per day do you exercise? 60    Total minutes per week of exercise 180      Patient Education   Previous Diabetes Education No    Disease Pathophysiology Definition of diabetes, type 1 and 2, and the diagnosis of diabetes;Explored patient's options for treatment of their diabetes    Healthy Eating  Food label reading, portion sizes and measuring food.;Plate Method;Carbohydrate counting;Reviewed blood glucose goals for pre and post meals and how to evaluate the patients' food intake on their blood glucose level.;Meal timing in regards to the patients' current diabetes medication.;Effects of alcohol on blood glucose and safety factors with consumption of alcohol.;Meal options for control of blood glucose level and chronic complications.    Being Active Role of exercise on diabetes management, blood pressure control and cardiac health.    Medications Reviewed patients medication for diabetes, action, purpose, timing of dose and side effects.    Monitoring Purpose and frequency of SMBG.;Identified appropriate SMBG and/or A1C goals.    Chronic complications Relationship between chronic complications and blood glucose control;Lipid levels, blood glucose control and heart disease;Retinopathy and reason for yearly dilated eye exams;Dental care    Diabetes Stress and Support Role of stress on diabetes    Lifestyle and Health Coping Lifestyle issues that need to be addressed for better diabetes care      Individualized Goals (developed by patient)   Nutrition General guidelines for healthy choices and portions discussed    Physical Activity Exercise 3-5 times per week;60 minutes per day    Medications take my medication as prescribed    Monitoring  Not Applicable    Problem Solving Eating Pattern    Reducing Risk do foot checks daily    Health Coping Discuss barriers to diabetes care with support person/system (comment specifics as needed)      Post-Education Assessment   Patient understands the diabetes disease and treatment process. Needs Review    Patient understands incorporating nutritional management into lifestyle. Needs Review    Patient undertands incorporating physical activity into lifestyle. Needs Review  Patient understands using medications safely. Needs Review    Patient  understands monitoring blood glucose, interpreting and using results Needs Review    Patient understands prevention, detection, and treatment of acute complications. Needs Review    Patient understands prevention, detection, and treatment of chronic complications. Needs Review    Patient understands how to develop strategies to address psychosocial issues. Needs Review    Patient understands how to develop strategies to promote health/change behavior. Needs Review      Outcomes   Expected Outcomes Demonstrated interest in learning. Expect positive outcomes    Future DMSE 3-4 months    Program Status Not Completed             Individualized Plan for Diabetes Self-Management Training:   Learning Objective:  Patient will have a greater understanding of diabetes self-management. Patient education plan is to attend individual and/or group sessions per assessed needs and concerns.   Plan:   Patient Instructions  1-Try the plate planner    1/2 plate of non starchy vegetables, 1/4 protein, 1/4 starchy 2-Great Job with beverages selections 3-Great Job with your physical activity  30-60 minutes 5-7 days weekly as tolerated   Expected Outcomes:  Demonstrated interest in learning. Expect positive outcomes  Education material provided: ADA - How to Thrive: A Guide for Your Journey with Diabetes, My Plate, and Snack sheet  If problems or questions, patient to contact team via:  Phone  Future DSME appointment: 3-4 months

## 2023-06-24 ENCOUNTER — Encounter: Payer: Self-pay | Admitting: Pharmacist

## 2023-07-28 ENCOUNTER — Ambulatory Visit: Payer: Medicare Other | Admitting: Family Medicine

## 2023-08-12 ENCOUNTER — Encounter: Payer: Self-pay | Admitting: Family Medicine

## 2023-08-12 ENCOUNTER — Ambulatory Visit: Payer: Medicare Other | Admitting: Family Medicine

## 2023-08-12 VITALS — BP 130/84 | HR 79 | Temp 98.6°F | Ht 71.0 in | Wt 316.0 lb

## 2023-08-12 DIAGNOSIS — Z6841 Body Mass Index (BMI) 40.0 and over, adult: Secondary | ICD-10-CM

## 2023-08-12 DIAGNOSIS — E785 Hyperlipidemia, unspecified: Secondary | ICD-10-CM

## 2023-08-12 DIAGNOSIS — Z7984 Long term (current) use of oral hypoglycemic drugs: Secondary | ICD-10-CM

## 2023-08-12 DIAGNOSIS — I2510 Atherosclerotic heart disease of native coronary artery without angina pectoris: Secondary | ICD-10-CM | POA: Diagnosis not present

## 2023-08-12 DIAGNOSIS — I10 Essential (primary) hypertension: Secondary | ICD-10-CM | POA: Diagnosis not present

## 2023-08-12 DIAGNOSIS — E1159 Type 2 diabetes mellitus with other circulatory complications: Secondary | ICD-10-CM | POA: Diagnosis not present

## 2023-08-12 LAB — HEMOGLOBIN A1C: Hgb A1c MFr Bld: 6.6 % — ABNORMAL HIGH (ref 4.6–6.5)

## 2023-08-12 LAB — GLUCOSE, RANDOM: Glucose, Bld: 124 mg/dL — ABNORMAL HIGH (ref 70–99)

## 2023-08-12 MED ORDER — VALSARTAN 320 MG PO TABS
320.0000 mg | ORAL_TABLET | Freq: Every day | ORAL | 3 refills | Status: DC
Start: 2023-08-12 — End: 2024-04-21

## 2023-08-12 NOTE — Progress Notes (Signed)
[facility]ARE [facility] [address] [place] [address] Dept: [phone] Dept Fax: [phone]  Chronic Care Office Visit  Subjective:    Patient ID: Bryan Brock, male    DOB: Jan 08, 1956, 67 y.o..   MRN: [id number]  Chief Complaint  Patient presents with   Diabetes    3 month f/u DM/HTN   History of Present Illness:  Patient is in today for reassessment of chronic medical issues.  Mr. Bryan Brock has a history of coronary artery disease, s/p 4-vessel CABG. He is managed with metoprolol tartrate 37.5 mg bid and lipid management. He is currently engaged in cardiac rehab.   Mr. Bryan Brock has a history hypertension. He is currently managed on metoprolol tartrate 37.5 mg 1 1/2 tabs bid, amlodipine 10 mg daily, and valsartan 160 mg daily.   Mr. Bryan Brock has a history of hyperlipidemia. He is managed on rosuvastatin 10 mg daily, omega-3 fatty acids 1 gm daily, and inclisiran (Leqvio) injections every 6 months.  Mr. Bryan Brock was diagnosed with Type 2 diabetes in Sept. He is managed on metformin 500 mg daily. He notes he did have diarrhea issues for a while, but this has been improved more recently. He did go for diabetes teaching and is focused on exercise and trying to improve his diet. He has been disappointed that he continues to gain weight he had lost during his prolonged recovery form his bypass surgery.  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    OSA (obstructive sleep apnea) 12/19/2021   Primary hypertension 10/29/2021   Prediabetes 10/17/2021   Morbid obesity with BMI of 40.0-44.9, adult (HCC) 10/17/2021   Bipolar 1 disorder, depressed (HCC) 05/11/2020   History of basal cell carcinoma 11/05/2007   Hyperlipidemia 11/05/2007   Premature ventricular contractions 11/05/2007   History of mitral valve prolapse 11/05/2007   Past Surgical History:  Procedure Laterality Date   APPLICATION OF WOUND VAC N/A 01/20/2023   Procedure: APPLICATION OF WOUND VAC;  Surgeon: Lovett Sox, MD;  [facility];  Service: Thoracic;  Laterality: N/A;   APPLICATION OF WOUND VAC N/A 01/23/2023   Procedure: APPLICATION OF WOUND VAC;  Surgeon: Peggye Form, DO;  [facility];  Service: Plastics;  Laterality: N/A;   APPLICATION OF WOUND VAC N/A 01/29/2023   Procedure: APPLICATION OF WOUND VAC;  Surgeon: Peggye Form, DO;  [facility];  Service: Plastics;  Laterality: N/A;   CORONARY ARTERY BYPASS GRAFT N/A 01/02/2023   Procedure: CORONARY ARTERY BYPASS GRAFTING (CABG) TIMES FOUR USING THE LEFT INTERNAL MAMMARY ARTERY (LIMA) AND BILATERAL GREATER SAPHENOUS VEIN ARTERIES;  Surgeon: [name], MD;  Location: [facility];  Service: Open Heart Surgery;  Laterality: N/A;  Open median sternotomy   CORONARY PRESSURE/FFR STUDY N/A 11/21/2022   Procedure: INTRAVASCULAR PRESSURE WIRE/FFR STUDY;  Surgeon: Orbie Pyo, MD;  [facility];  Service: Cardiovascular;  Laterality: N/A;   HYDROCELE EXCISION Bilateral 03/22/2022   Procedure: HYDROCELECTOMY ADULT;  Surgeon: Noel Christmas, MD;  Location: [facility];  Service: Urology;  Laterality: Bilateral;  2 HRS   INCISION AND DRAINAGE OF WOUND N/A 01/29/2023   Procedure: Sternal wound irrigation and debridement, placement of myriad and wound VAC change;  Surgeon: Peggye Form, DO;  [facility];  Service: Plastics;  Laterality: N/A;   INCISION AND DRAINAGE OF WOUND N/A  01/31/2023   Procedure: IRRIGATION AND DEBRIDEMENT OF STERNAL WOUND;  Surgeon: Peggye Form, DO;  [facility];  Service: Plastics;  Laterality: N/A;   LEFT HEART CATH AND CORONARY ANGIOGRAPHY N/A 11/21/2022   Procedure: LEFT HEART CATH AND CORONARY ANGIOGRAPHY;  Surgeon: Orbie Pyo, MD;  [facility];  Service: Cardiovascular;  Laterality: N/A;   MOHS SURGERY  1990   PECTORALIS FLAP Right 01/31/2023   Procedure: RIGHT PECTORALIS FLAP;  Surgeon: Peggye Form, DO;  [facility];  Service: Plastics;  Laterality: Right;   STERNAL INCISION RECLOSURE N/A 01/07/2023   Procedure: STERNAL REWIRING;  Surgeon: [name], MD;  [facility];  Service: Thoracic;  Laterality: N/A;   STERNAL WOUND DEBRIDEMENT N/A 01/20/2023   Procedure: DRAIN STERNAL INFECTION;  Surgeon: Lovett Sox, MD;  Location: [facility] OR;  Service: Thoracic;  Laterality: N/A;   STERNAL WOUND DEBRIDEMENT N/A 01/23/2023   Procedure: Excision of sternal wound with Myriad;  Surgeon: Peggye Form, DO;  [facility];  Service: Plastics;  Laterality: N/A;   STRABISMUS SURGERY Left    TEE WITHOUT CARDIOVERSION N/A 01/02/2023   Procedure: TRANSESOPHAGEAL ECHOCARDIOGRAM;  Surgeon: [name], MD;  Location: [facility] OR;  Service: Open Heart Surgery;  Laterality: N/A;   TONSILLECTOMY AND ADENOIDECTOMY     WISDOM TOOTH EXTRACTION     Family History  Problem Relation Age of Onset   Heart disease Mother    Diabetes Mother    Obesity Mother    Hyperlipidemia Mother    Cancer Father        skin cancer   Heart disease Father    Hyperlipidemia Father    Colon cancer Neg Hx    Esophageal cancer Neg Hx    Rectal cancer Neg Hx    Stomach cancer Neg Hx    Outpatient Medications Prior to Visit  Medication Sig Dispense Refill   albuterol (VENTOLIN HFA) 108 (90 Base) MCG/ACT inhaler Inhale 2 puffs into the lungs every 6 (six) hours as needed for wheezing or shortness of breath. 6.7 g 0    amLODipine (NORVASC) 10 MG tablet Take 1 tablet (10 mg total) by mouth daily. 90 tablet 3   APPLE CIDER VINEGAR PO Take 15 mLs by mouth every Monday, Wednesday, and Friday.     Ascorbic Acid (VITAMIN C) 1000 MG tablet Take 3,000 mg by mouth daily.     aspirin EC 81 MG tablet Take 1 tablet (81 mg total) by mouth daily.     calcium elemental as carbonate (TUMS ULTRA 1000) 400 MG chewable tablet Chew 2,000 mg by mouth 3 (three) times daily as needed for heartburn.     Cholecalciferol (DIALYVITE VITAMIN D 5000) 125 MCG (5000 UT) capsule Take 20,000 Units by mouth daily.     Coenzyme Q10 (COQ-10) 100 MG CAPS Take 100 mg by mouth daily.     Flaxseed, Linseed, (FLAX SEED OIL) 1000 MG CAPS Take 1,000 mg by mouth daily.     folic acid (FOLVITE) 800 MCG tablet Take 800 mcg by mouth daily.     inclisiran (LEQVIO) 284 MG/1.5ML SOSY  injection Inject 1.75ml every 6 months     lamoTRIgine (LAMICTAL) 100 MG tablet Take 100 mg by mouth daily.     Liniments (BLUE-EMU SUPER STRENGTH EX) Apply 1 Application topically daily as needed (pain).     magnesium oxide (MAG-OX) 400 (240 Mg) MG tablet Take 400 mg by mouth daily.     metFORMIN (GLUCOPHAGE) 500 MG tablet Take 1 tablet (500 mg total) by mouth daily with breakfast. 90 tablet 3   metoprolol tartrate (LOPRESSOR) 25 MG tablet Take 1.5 tablets (37.5 mg total) by mouth 2 (two) times daily. 270 tablet 3   Multiple Vitamins-Minerals (MULTIVITAMIN WITH MINERALS) tablet Take 1 tablet by mouth daily.     Omega 3 1000 MG CAPS Take 1,000 mg by mouth daily.     OVER THE COUNTER MEDICATION Nano CBD OIL: Pt takes 20 drops in the morning and 20 drops in the evening.     rosuvastatin (CRESTOR) 10 MG tablet Take 1 tablet (10 mg total) by mouth daily.     sildenafil (VIAGRA) 100 MG tablet Take 100 mg by mouth daily as needed for erectile dysfunction.     zinc gluconate 50 MG tablet Take 50 mg by mouth daily.     valsartan (DIOVAN) 160 MG tablet Take 1 tablet (160 mg total) by mouth  daily. 90 tablet 3   furosemide (LASIX) 20 MG tablet Take 1 tablet (20 mg total) by mouth daily as needed (swelling). 30 tablet 3   No facility-administered medications prior to visit.   Allergies  Allergen Reactions   Latex Dermatitis   Lisinopril Cough   Objective:   Today's Vitals   08/12/23 1028  BP: 130/84  Pulse: 79  Temp: 98.6 F (37 C)  TempSrc: Temporal  SpO2: 97%  Weight: (!) 316 lb (143.3 kg)  Height: 5\' 11"  (1.803 m)   Body mass index is 44.07 kg/m.   General: Well developed, well nourished. No acute distress. Feet- Skin intact. No sign of maceration between toes. Nails are normal. Dorsalis pedis and posterior tibial   artery pulses are normal. 5.07 monofilament testing normal. Psych: Alert and oriented. Normal mood and affect.  Health Maintenance Due  Topic Date Due   OPHTHALMOLOGY EXAM  Never done   Hepatitis C Screening  Never done     Assessment & Plan:   Problem List Items Addressed This Visit       Cardiovascular and Mediastinum   Coronary artery disease   4 vessel CABG last spring. Continue aspirin 81 mg daily and metoprolol tartrate 37.5 mg 1 1/2 tabs bid.      Relevant Medications   valsartan (DIOVAN) 320 MG tablet   Primary hypertension   Blood pressure is still a little high. Continue metoprolol tartrate 37.5 mg  1 1/2 tabs bid, and amlodipine 10 mg daily. I will increase his valsartan to 320 mg daily.      Relevant Medications   valsartan (DIOVAN) 320 MG tablet   Type 2 diabetes mellitus with cardiac complication (HCC) - Primary   I will reassess an A1c today. Continue metformin 500 mg daily.      Relevant Medications   valsartan (DIOVAN) 320 MG tablet   Other Relevant Orders   Glucose, random   Hemoglobin A1c     Other   Hyperlipidemia   LDL cholesterol at goal. Continue rosuvastatin 10 mg daily, omega-3 fatty acids 1 gm daily, and inclisiran (Leqvio) injections every 3 months.      Relevant Medications  valsartan  (DIOVAN) 320 MG tablet   Morbid obesity with BMI of 40.0-44.9, adult (HCC)   I support the efforts that Mr. evins is making to improve his health and achieve weight loss. We will continue to follow this for now. He could be a candidate for a GLP-1 RA if he does not meet goals or tolerate metformin.       Return in about 3 months (around 11/10/2023) for Reassessment.   Loyola Mast, MD

## 2023-08-12 NOTE — Assessment & Plan Note (Signed)
LDL cholesterol at goal. Continue rosuvastatin 10 mg daily, omega-3 fatty acids 1 gm daily, and inclisiran (Leqvio) injections every 3 months.

## 2023-08-12 NOTE — Assessment & Plan Note (Signed)
4 vessel CABG last spring. Continue aspirin 81 mg daily and metoprolol tartrate 37.5 mg 1 1/2 tabs bid.

## 2023-08-12 NOTE — Assessment & Plan Note (Signed)
I support the efforts that Bryan Brock is making to improve his health and achieve weight loss. We will continue to follow this for now. He could be a candidate for a GLP-1 RA if he does not meet goals or tolerate metformin.

## 2023-08-12 NOTE — Assessment & Plan Note (Signed)
I will reassess an A1c today. Continue metformin 500 mg daily.

## 2023-08-12 NOTE — Assessment & Plan Note (Signed)
Blood pressure is still a little high. Continue metoprolol tartrate 37.5 mg  1 1/2 tabs bid, and amlodipine 10 mg daily. I will increase his valsartan to 320 mg daily.

## 2023-08-14 ENCOUNTER — Encounter: Payer: Self-pay | Admitting: Family Medicine

## 2023-08-14 NOTE — Telephone Encounter (Signed)
 Care team updated and letter sent for eye exam notes.

## 2023-09-04 ENCOUNTER — Telehealth: Payer: Self-pay

## 2023-09-04 NOTE — Telephone Encounter (Signed)
 Auth Submission: NO AUTH NEEDED Site of care: Site of care: [facility] Payer: Medicare a/b & BCBS supp Medication & CPT/J Code(s) submitted: Leqvio  (Inclisiran) J1306 Route of submission (phone, fax, portal):  Phone # Fax # Auth type: Buy/Bill Units/visits requested: 284mg  x 2 doses Reference number: Approval from: 12/18/22 to 09/25/24

## 2023-09-25 MED ORDER — FUROSEMIDE 20 MG PO TABS: 20.0000 mg | ORAL_TABLET | Freq: Every day | ORAL | 2 refills | Status: DC | PRN

## 2023-10-02 ENCOUNTER — Ambulatory Visit: Payer: Medicare Other

## 2023-10-02 VITALS — BP 143/83 | HR 80 | Temp 98.7°F | Resp 18 | Ht 72.0 in | Wt 319.0 lb

## 2023-10-02 DIAGNOSIS — E785 Hyperlipidemia, unspecified: Secondary | ICD-10-CM | POA: Diagnosis not present

## 2023-10-02 DIAGNOSIS — I10 Essential (primary) hypertension: Secondary | ICD-10-CM | POA: Diagnosis not present

## 2023-10-02 MED ORDER — INCLISIRAN SODIUM 284 MG/1.5ML ~~LOC~~ SOSY
284.0000 mg | PREFILLED_SYRINGE | Freq: Once | SUBCUTANEOUS | Status: AC
  Administered 2023-10-02: 284 mg via SUBCUTANEOUS

## 2023-10-02 NOTE — Progress Notes (Signed)
 Diagnosis: Hyperlipidemia  Provider:  Chilton Greathouse MD  Procedure: Injection  Leqvio (inclisiran), Dose: 284 mg, Site: subcutaneous, Number of injections: 1  Injection Site(s): Left arm  Post Care: Patient declined observation  Discharge: Condition: Good, Destination: Home . AVS Provided  Performed by:  Loney Hering, LPN

## 2023-10-07 DIAGNOSIS — I10 Essential (primary) hypertension: Secondary | ICD-10-CM

## 2023-10-08 ENCOUNTER — Other Ambulatory Visit: Payer: Self-pay | Admitting: Nurse Practitioner

## 2023-10-08 DIAGNOSIS — I10 Essential (primary) hypertension: Secondary | ICD-10-CM

## 2023-10-08 MED ORDER — HYDROCHLOROTHIAZIDE 25 MG PO TABS: 25.0000 mg | ORAL_TABLET | Freq: Every day | ORAL | 3 refills | Status: DC

## 2023-10-20 LAB — BASIC METABOLIC PANEL
BUN/Creatinine Ratio: 15 (ref 10–24)
BUN: 21 mg/dL (ref 8–27)
CO2: 22 mmol/L (ref 20–29)
Calcium: 10.1 mg/dL (ref 8.6–10.2)
Chloride: 98 mmol/L (ref 96–106)
Creatinine, Ser: 1.4 mg/dL — ABNORMAL HIGH (ref 0.76–1.27)
Glucose: 172 mg/dL — ABNORMAL HIGH (ref 70–99)
Potassium: 4.3 mmol/L (ref 3.5–5.2)
Sodium: 138 mmol/L (ref 134–144)
eGFR: 55 mL/min/{1.73_m2} — ABNORMAL LOW (ref >59)

## 2023-11-11 ENCOUNTER — Ambulatory Visit: Payer: Medicare Other | Admitting: Family Medicine

## 2023-11-13 ENCOUNTER — Ambulatory Visit: Payer: Medicare Other | Admitting: Family Medicine

## 2023-11-13 ENCOUNTER — Encounter: Payer: Self-pay | Admitting: Family Medicine

## 2023-11-13 VITALS — BP 118/74 | HR 84 | Temp 97.3°F | Ht 72.0 in | Wt 320.8 lb

## 2023-11-13 DIAGNOSIS — E785 Hyperlipidemia, unspecified: Secondary | ICD-10-CM

## 2023-11-13 DIAGNOSIS — E1159 Type 2 diabetes mellitus with other circulatory complications: Secondary | ICD-10-CM

## 2023-11-13 DIAGNOSIS — Z7984 Long term (current) use of oral hypoglycemic drugs: Secondary | ICD-10-CM | POA: Diagnosis not present

## 2023-11-13 DIAGNOSIS — I2510 Atherosclerotic heart disease of native coronary artery without angina pectoris: Secondary | ICD-10-CM

## 2023-11-13 DIAGNOSIS — I10 Essential (primary) hypertension: Secondary | ICD-10-CM

## 2023-11-13 LAB — GLUCOSE, RANDOM: Glucose, Bld: 135 mg/dL — ABNORMAL HIGH (ref 70–99)

## 2023-11-13 LAB — HEMOGLOBIN A1C: Hgb A1c MFr Bld: 6.8 % — ABNORMAL HIGH (ref 4.6–6.5)

## 2023-11-13 MED ORDER — OZEMPIC (0.25 OR 0.5 MG/DOSE) 2 MG/3ML ~~LOC~~ SOPN: 0.2500 mg | PEN_INJECTOR | SUBCUTANEOUS | 0 refills | Status: DC

## 2023-11-13 MED ORDER — TIRZEPATIDE 2.5 MG/0.5ML ~~LOC~~ SOAJ: 2.5000 mg | SUBCUTANEOUS | 0 refills | Status: DC

## 2023-11-13 NOTE — Patient Instructions (Signed)
 Return for blood pressure check with the medical assistant in 2 weeks.

## 2023-11-13 NOTE — Assessment & Plan Note (Addendum)
 4 vessel CABG last spring. Continue aspirin 81 mg daily and metoprolol tartrate 25 mg 1 1/2 tabs (37.5 mg) bid.

## 2023-11-13 NOTE — Progress Notes (Signed)
 [facility] [address] [place] [address] Dept: [phone] Dept Fax: [phone]  Chronic Care Office Visit  Subjective:    Patient ID: Bryan Brock, male    DOB: [birth year], 68 y.o..   MRN: [id number]  Chief Complaint  Patient presents with   Diabetes    3 month f/u DM/HTN.   C/o weight.     History of Present Illness:  Patient is in today for reassessment of chronic medical issues.  Mr. Bryan Brock has a history of coronary artery disease, s/p 4-vessel CABG. He is managed with metoprolol tartrate 37.5 mg bid and lipid management. He is currently engaged in cardiac rehab.   Mr. Bryan Brock has a history hypertension. He is currently managed on metoprolol tartrate 25 mg 1 1/2 tabs (37.5 mg) bid, amlodipine 10 mg daily, and valsartan 320 mg daily.   Mr. Bryan Brock has a history of hyperlipidemia. He is managed on rosuvastatin 10 mg daily, omega-3 fatty acids 1 gm daily, and inclisiran (Leqvio) injections every 6 months.   Mr. Bryan Brock was diagnosed with Type 2 diabetes in Sept. He is managed on metformin 500 mg daily. He continues to be frustrated about difficulty with weight loss. .   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   OSA (obstructive sleep apnea) 12/19/2021   Primary hypertension 10/29/2021   Morbid obesity with BMI of 40.0-44.9, adult (HCC) 10/17/2021   Bipolar 1 disorder, depressed (HCC) 05/11/2020   History of basal  cell carcinoma 11/05/2007   Hyperlipidemia 11/05/2007   Premature ventricular contractions 11/05/2007   History of mitral valve prolapse 11/05/2007   Past Surgical History:  Procedure Laterality Date   APPLICATION OF WOUND VAC N/A 01/20/2023   Procedure: APPLICATION OF WOUND VAC;  Surgeon: Lovett Sox, MD;  [facility];  Service: Thoracic;  Laterality: N/A;   APPLICATION OF WOUND VAC N/A 01/23/2023   Procedure: APPLICATION OF WOUND VAC;  Surgeon: Peggye Form, DO;  [facility]: [facility];  Service: Plastics;  Laterality: N/A;   APPLICATION OF WOUND VAC N/A 01/29/2023   Procedure: APPLICATION OF WOUND VAC;  Surgeon: Peggye Form, DO;  [facility]: [facility];  Service: Plastics;  Laterality: N/A;   CORONARY ARTERY BYPASS GRAFT N/A 01/02/2023   Procedure: CORONARY ARTERY BYPASS GRAFTING (CABG) TIMES FOUR USING THE LEFT INTERNAL MAMMARY ARTERY (LIMA) AND BILATERAL GREATER SAPHENOUS VEIN ARTERIES;  Surgeon: [name], MD;  Location: [facility];  Service: Open Heart Surgery;  Laterality: N/A;  Open median sternotomy   CORONARY PRESSURE/FFR STUDY N/A 11/21/2022   Procedure: INTRAVASCULAR PRESSURE WIRE/FFR STUDY;  Surgeon: Orbie Pyo, MD;  [facility];  Service: Cardiovascular;  Laterality: N/A;   HYDROCELE EXCISION Bilateral 03/22/2022   Procedure: HYDROCELECTOMY ADULT;  Surgeon: Noel Christmas, MD;  Location: [facility];  Service: Urology;  Laterality: Bilateral;  2 HRS   INCISION AND DRAINAGE OF WOUND N/A 01/29/2023   Procedure: Sternal wound irrigation and debridement, placement of myriad and wound VAC change;  Surgeon: Peggye Form, DO;  [facility];  Service: Plastics;  Laterality: N/A;   INCISION AND DRAINAGE OF WOUND N/A 01/31/2023   Procedure: IRRIGATION AND DEBRIDEMENT OF STERNAL WOUND;  Surgeon: Peggye Form, DO;  [facility]: [facility];  Service: Plastics;  Laterality: N/A;   LEFT HEART CATH AND CORONARY ANGIOGRAPHY N/A 11/21/2022   Procedure: LEFT HEART  CATH AND CORONARY ANGIOGRAPHY;  Surgeon: Orbie Pyo, MD;  [facility];  Service: Cardiovascular;  Laterality: N/A;   MOHS SURGERY  1990   PECTORALIS FLAP Right 01/31/2023   Procedure: RIGHT PECTORALIS FLAP;  Surgeon: Peggye Form, DO;  [facility]: [facility];  Service: Plastics;  Laterality: Right;   STERNAL INCISION RECLOSURE N/A 01/07/2023   Procedure: STERNAL REWIRING;  Surgeon: [name], MD;  [facility]: [facility];  Service: Thoracic;  Laterality: N/A;   STERNAL WOUND DEBRIDEMENT N/A 01/20/2023   Procedure: DRAIN STERNAL INFECTION;  Surgeon: Lovett Sox, MD;  Location: [facility] OR;  Service: Thoracic;  Laterality: N/A;   STERNAL WOUND DEBRIDEMENT N/A 01/23/2023   Procedure: Excision of sternal wound with Myriad;  Surgeon: Peggye Form, DO;  [facility];  Service: Plastics;  Laterality: N/A;   STRABISMUS SURGERY Left    TEE WITHOUT CARDIOVERSION N/A 01/02/2023   Procedure: TRANSESOPHAGEAL ECHOCARDIOGRAM;  Surgeon: [name], MD;  Location: [facility] OR;  Service: Open Heart Surgery;  Laterality: N/A;   TONSILLECTOMY AND ADENOIDECTOMY     WISDOM TOOTH EXTRACTION     Family History  Problem Relation Age of Onset   Heart disease Mother    Diabetes Mother    Obesity Mother    Hyperlipidemia Mother    Cancer Father        skin cancer   Heart disease Father    Hyperlipidemia Father    Colon cancer Neg Hx    Esophageal cancer Neg Hx    Rectal cancer Neg Hx    Stomach cancer Neg Hx    Outpatient Medications Prior to Visit  Medication Sig Dispense Refill   albuterol (VENTOLIN HFA) 108 (90 Base) MCG/ACT inhaler Inhale 2 puffs into the lungs every 6 (six) hours as needed for wheezing or shortness of breath. 6.7 g 0   amLODipine (NORVASC) 10 MG tablet Take 1 tablet (10 mg total) by mouth daily. 90 tablet 3   APPLE CIDER VINEGAR PO Take 15 mLs by mouth every Monday, Wednesday, and Friday.     Ascorbic Acid (VITAMIN C) 1000 MG tablet Take 3,000 mg by mouth daily.      aspirin EC 81 MG tablet Take 1 tablet (81 mg total) by mouth daily.     calcium elemental as carbonate (TUMS ULTRA 1000) 400 MG chewable tablet Chew 2,000 mg by mouth 3 (three) times daily as needed for heartburn.     Cholecalciferol (DIALYVITE VITAMIN D 5000) 125 MCG (5000 UT) capsule Take 20,000 Units by mouth daily.     Coenzyme Q10 (COQ-10) 100 MG CAPS Take 100 mg by mouth daily.     Flaxseed, Linseed, (FLAX SEED OIL) 1000 MG CAPS Take 1,000 mg by mouth daily.     folic acid (FOLVITE) 800 MCG tablet Take 800 mcg by mouth daily.     hydrochlorothiazide (HYDRODIURIL) 25 MG tablet Take 1 tablet (25 mg total) by mouth daily. 90 tablet 3   inclisiran (LEQVIO) 284 MG/1.5ML SOSY injection Inject 1.53ml every 6 months     lamoTRIgine (LAMICTAL) 100 MG tablet Take 100 mg  by mouth daily.     Liniments (BLUE-EMU SUPER STRENGTH EX) Apply 1 Application topically daily as needed (pain).     magnesium oxide (MAG-OX) 400 (240 Mg) MG tablet Take 400 mg by mouth daily.     metFORMIN (GLUCOPHAGE) 500 MG tablet Take 1 tablet (500 mg total) by mouth daily with breakfast. 90 tablet 3   metoprolol tartrate (LOPRESSOR) 25 MG tablet Take 1.5 tablets (37.5 mg total) by mouth 2 (two) times daily. 270 tablet 3   Multiple Vitamins-Minerals (MULTIVITAMIN WITH MINERALS) tablet Take 1 tablet by mouth daily.     Omega 3 1000 MG CAPS Take 1,000 mg by mouth daily.     OVER THE COUNTER MEDICATION Nano CBD OIL: Pt takes 20 drops in the morning and 20 drops in the evening.     rosuvastatin (CRESTOR) 10 MG tablet Take 1 tablet (10 mg total) by mouth daily.     sildenafil (VIAGRA) 100 MG tablet Take 100 mg by mouth daily as needed for erectile dysfunction.     valsartan (DIOVAN) 320 MG tablet Take 1 tablet (320 mg total) by mouth daily. 90 tablet 3   zinc gluconate 50 MG tablet Take 50 mg by mouth daily.     No facility-administered medications prior to visit.   Allergies  Allergen Reactions   Latex Dermatitis   Lisinopril  Cough   Objective:   Today's Vitals   11/13/23 1032  BP: 118/74  Pulse: 84  Temp: (!) 97.3 F (36.3 C)  TempSrc: Temporal  SpO2: 96%  Weight: (!) 320 lb 12.8 oz (145.5 kg)  Height: 6' (1.829 m)   Body mass index is 43.51 kg/m.   General: Well developed, well nourished. No acute distress. Psych: Alert and oriented. Normal mood and affect.  Health Maintenance Due  Topic Date Due   Hepatitis C Screening  Never done   Zoster Vaccines- Shingrix (1 of 2) Never done    Assessment & Plan:   Problem List Items Addressed This Visit       Cardiovascular and Mediastinum   Coronary artery disease   4 vessel CABG last spring. Continue aspirin 81 mg daily and metoprolol tartrate 25 mg 1 1/2 tabs (37.5 mg) bid.      Primary hypertension   Blood pressure is at goal in clinic, though high on his home BP machine. Continue metoprolol tartrate 25 mg 1 1/2 tabs (37.5 mg) bid, amlodipine 10 mg daily and valsartan 320 mg daily. I recommend he return in 2 weeks to have his BP check by my MA and stop home monitoring for now.      Type 2 diabetes mellitus with cardiac complication (HCC) - Primary   Diabetes has been at goal. I will check his A1c today. Continue metformin 500 mg daily. We did discuss potential benefit of GLP1-RAs for weight loss, as well as diabetes control. His co-pay would be cost prohibitive at this point.      Relevant Orders   Glucose, random (Completed)   Hemoglobin A1c (Completed)     Other   Hyperlipidemia   LDL cholesterol at goal. Continue rosuvastatin 10 mg daily, omega-3 fatty acids 1 gm daily, and inclisiran (Leqvio) injections every 3 months.       Return in about 3 months (around 02/13/2024) for Reassessment.   Loyola Mast, MD

## 2023-11-13 NOTE — Assessment & Plan Note (Signed)
 Blood pressure is at goal in clinic, though high on his home BP machine. Continue metoprolol tartrate 25 mg 1 1/2 tabs (37.5 mg) bid, amlodipine 10 mg daily and valsartan 320 mg daily. I recommend he return in 2 weeks to have his BP check by my MA and stop home monitoring for now.

## 2023-11-13 NOTE — Assessment & Plan Note (Signed)
LDL cholesterol at goal. Continue rosuvastatin 10 mg daily, omega-3 fatty acids 1 gm daily, and inclisiran (Leqvio) injections every 3 months.

## 2023-11-13 NOTE — Assessment & Plan Note (Signed)
 Diabetes has been at goal. I will check his A1c today. Continue metformin 500 mg daily. We did discuss potential benefit of GLP1-RAs for weight loss, as well as diabetes control. His co-pay would be cost prohibitive at this point.

## 2023-11-26 ENCOUNTER — Other Ambulatory Visit: Payer: Self-pay

## 2023-11-26 MED ORDER — ROSUVASTATIN CALCIUM 10 MG PO TABS: 10.0000 mg | ORAL_TABLET | Freq: Every day | ORAL | 1 refills | Status: DC

## 2023-11-27 ENCOUNTER — Encounter: Payer: Self-pay | Admitting: Family Medicine

## 2023-11-27 ENCOUNTER — Ambulatory Visit (INDEPENDENT_AMBULATORY_CARE_PROVIDER_SITE_OTHER): Admitting: Family Medicine

## 2023-11-27 VITALS — BP 117/72 | HR 81 | Temp 97.1°F | Ht 72.0 in | Wt 321.8 lb

## 2023-11-27 DIAGNOSIS — I10 Essential (primary) hypertension: Secondary | ICD-10-CM

## 2023-11-27 NOTE — Progress Notes (Signed)
 [facility] [address] [place] [address] Dept: [phone] Dept Fax: [phone]  Chronic Care Office Visit  Subjective:    Patient ID: Bryan Brock, male    DOB: 09-Oct-1955, 68 y.o..   MRN: [id number]  Chief Complaint  Patient presents with   Hypertension    2 week f/u HTN.       History of Present Illness:  Patient is in today for reassessment of chronic medical issues.  Bryan Brock has a history hypertension. He is currently managed on metoprolol tartrate 25 mg 1 1/2 tabs (37.5 mg) bid, amlodipine 10 mg daily, and valsartan 320 mg daily. At his last visit, he raised concerns about high BPs at times at home. We discussed him reducing his checking of his BP so frequently at home and allow me to help him monitor this. He did bring in his machine today and it does correlate fairly well with his home BP measurements.  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   OSA (obstructive sleep apnea) 12/19/2021   Primary hypertension 10/29/2021   Morbid obesity with BMI of 40.0-44.9, adult (HCC) 10/17/2021   Bipolar 1 disorder, depressed (HCC) 05/11/2020   History of basal cell carcinoma 11/05/2007   Hyperlipidemia 11/05/2007   Premature ventricular contractions 11/05/2007   History of mitral valve prolapse 11/05/2007   Past Surgical History:  Procedure Laterality Date   APPLICATION OF WOUND VAC N/A 01/20/2023   Procedure:  APPLICATION OF WOUND VAC;  Surgeon: Lovett Sox, MD;  [facility];  Service: Thoracic;  Laterality: N/A;   APPLICATION OF WOUND VAC N/A 01/23/2023   Procedure: APPLICATION OF WOUND VAC;  Surgeon: Peggye Form, DO;  [facility];  Service: Plastics;  Laterality: N/A;   APPLICATION OF WOUND VAC N/A 01/29/2023   Procedure: APPLICATION OF WOUND VAC;  Surgeon: Peggye Form, DO;  [facility];  Service: Plastics;  Laterality: N/A;   CORONARY ARTERY BYPASS GRAFT N/A 01/02/2023   Procedure: CORONARY ARTERY BYPASS GRAFTING (CABG) TIMES FOUR USING THE LEFT INTERNAL MAMMARY ARTERY (LIMA) AND BILATERAL GREATER SAPHENOUS VEIN ARTERIES;  Surgeon: [name], MD;  [facility]: [facility];  Service: Open Heart Surgery;  Laterality: N/A;  Open median sternotomy   CORONARY PRESSURE/FFR STUDY N/A 11/21/2022   Procedure: INTRAVASCULAR PRESSURE WIRE/FFR STUDY;  Surgeon: Orbie Pyo, MD;  [facility];  Service: Cardiovascular;  Laterality: N/A;   HYDROCELE EXCISION Bilateral 03/22/2022   Procedure: HYDROCELECTOMY ADULT;  Surgeon: Noel Christmas, MD;  Location: [facility];  Service: Urology;  Laterality: Bilateral;  2 HRS   INCISION AND DRAINAGE OF WOUND N/A 01/29/2023   Procedure: Sternal wound irrigation and debridement, placement of myriad and wound VAC change;  Surgeon: Peggye Form, DO;  [facility];  Service: Plastics;  Laterality: N/A;   INCISION AND DRAINAGE OF WOUND N/A 01/31/2023   Procedure: IRRIGATION AND DEBRIDEMENT OF STERNAL WOUND;  Surgeon: Peggye Form, DO;  [facility];  Service: Government social research officer;  Laterality: N/A;   LEFT HEART CATH AND CORONARY ANGIOGRAPHY N/A 11/21/2022   Procedure: LEFT HEART CATH AND CORONARY ANGIOGRAPHY;  Surgeon: Orbie Pyo, MD;  [facility];  Service: Cardiovascular;  Laterality: N/A;   MOHS SURGERY  1990   PECTORALIS FLAP Right 01/31/2023   Procedure: RIGHT PECTORALIS FLAP;  Surgeon: Peggye Form,  DO;  [facility];  Service: Plastics;  Laterality: Right;   STERNAL INCISION RECLOSURE N/A 01/07/2023   Procedure: STERNAL REWIRING;  Surgeon: [name], MD;  [facility];  Service: Thoracic;  Laterality: N/A;   STERNAL WOUND DEBRIDEMENT N/A 01/20/2023   Procedure: DRAIN STERNAL INFECTION;  Surgeon: Lovett Sox, MD;  Location: [facility] OR;  Service: Thoracic;  Laterality: N/A;   STERNAL WOUND DEBRIDEMENT N/A 01/23/2023   Procedure: Excision of sternal wound with Myriad;  Surgeon: Peggye Form, DO;  [facility];  Service: Plastics;  Laterality: N/A;   STRABISMUS SURGERY Left    TEE WITHOUT CARDIOVERSION N/A 01/02/2023   Procedure: TRANSESOPHAGEAL ECHOCARDIOGRAM;  Surgeon: [name], MD;  Location: [facility] OR;  Service: Open Heart Surgery;  Laterality: N/A;   TONSILLECTOMY AND ADENOIDECTOMY     WISDOM TOOTH EXTRACTION     Family History  Problem Relation Age of Onset   Heart disease Mother    Diabetes Mother    Obesity Mother    Hyperlipidemia Mother    Cancer Father        skin cancer   Heart disease Father    Hyperlipidemia Father    Colon cancer Neg Hx    Esophageal cancer Neg Hx    Rectal cancer Neg Hx    Stomach cancer Neg Hx    Outpatient Medications Prior to Visit  Medication Sig Dispense Refill   albuterol (VENTOLIN HFA) 108 (90 Base) MCG/ACT inhaler Inhale 2 puffs into the lungs every 6 (six) hours as needed for wheezing or shortness of breath. 6.7 g 0   amLODipine (NORVASC) 10 MG tablet Take 1 tablet (10 mg total) by mouth daily. 90 tablet 3   APPLE CIDER VINEGAR PO Take 15 mLs by mouth every Monday, Wednesday, and Friday.     Ascorbic Acid (VITAMIN C) 1000 MG tablet Take 3,000 mg by mouth daily.     aspirin EC 81 MG tablet Take 1 tablet (81 mg total) by mouth daily.     calcium elemental as carbonate (TUMS ULTRA 1000) 400 MG chewable tablet Chew 2,000 mg by mouth 3 (three) times daily as needed for heartburn.     Cholecalciferol (DIALYVITE VITAMIN D  5000) 125 MCG (5000 UT) capsule Take 20,000 Units by mouth daily.     Coenzyme Q10 (COQ-10) 100 MG CAPS Take 100 mg by mouth daily.     Flaxseed, Linseed, (FLAX SEED OIL) 1000 MG CAPS Take 1,000 mg by mouth daily.     folic acid (FOLVITE) 800 MCG tablet Take 800 mcg by mouth daily.     hydrochlorothiazide (HYDRODIURIL) 25 MG tablet Take 1 tablet (25 mg total) by mouth daily. 90 tablet 3   inclisiran (LEQVIO) 284 MG/1.5ML SOSY injection Inject 1.87ml every 6 months     lamoTRIgine (LAMICTAL) 100 MG tablet Take 100 mg by mouth daily.     Liniments (BLUE-EMU SUPER STRENGTH EX) Apply 1 Application topically daily as needed (pain).     magnesium oxide (MAG-OX) 400 (240 Mg) MG tablet Take 400 mg by mouth daily.     metFORMIN (GLUCOPHAGE)  500 MG tablet Take 1 tablet (500 mg total) by mouth daily with breakfast. 90 tablet 3   metoprolol tartrate (LOPRESSOR) 25 MG tablet Take 1.5 tablets (37.5 mg total) by mouth 2 (two) times daily. 270 tablet 3   Multiple Vitamins-Minerals (MULTIVITAMIN WITH MINERALS) tablet Take 1 tablet by mouth daily.     Omega 3 1000 MG CAPS Take 1,000 mg by mouth daily.     OVER THE COUNTER MEDICATION Nano CBD OIL: Pt takes 20 drops in the morning and 20 drops in the evening.     rosuvastatin (CRESTOR) 10 MG tablet Take 1 tablet (10 mg total) by mouth daily. 90 tablet 1   sildenafil (VIAGRA) 100 MG tablet Take 100 mg by mouth daily as needed for erectile dysfunction.     valsartan (DIOVAN) 320 MG tablet Take 1 tablet (320 mg total) by mouth daily. 90 tablet 3   zinc gluconate 50 MG tablet Take 50 mg by mouth daily.     No facility-administered medications prior to visit.   Allergies  Allergen Reactions   Latex Dermatitis   Lisinopril Cough   Objective:   Today's Vitals   11/27/23 1045 11/27/23 1050  BP: 124/62 117/72  Pulse: 81 81  Temp: (!) 97.1 F (36.2 C) (!) 97.1 F (36.2 C)  TempSrc: Temporal Temporal  SpO2: 99% 99%  Weight: (!) 321 lb 12.8 oz (146 kg) (!) 321  lb 12.8 oz (146 kg)  Height: 6' (1.829 m) 6' (1.829 m)   Body mass index is 43.64 kg/m.   General: Well developed, well nourished. No acute distress. Psych: Alert and oriented. Normal mood and affect.  Health Maintenance Due  Topic Date Due   Hepatitis C Screening  Never done   Zoster Vaccines- Shingrix (1 of 2) Never done     Assessment & Plan:   Problem List Items Addressed This Visit       Cardiovascular and Mediastinum   Primary hypertension - Primary   Blood pressure is at goal. Continue metoprolol tartrate 25 mg 1 1/2 tabs (37.5 mg) bid, amlodipine 10 mg daily and valsartan 320 mg daily.       Return for Follow-up as scheduled.   Loyola Mast, MD

## 2023-11-27 NOTE — Assessment & Plan Note (Signed)
 Blood pressure is at goal. Continue metoprolol tartrate 25 mg 1 1/2 tabs (37.5 mg) bid, amlodipine 10 mg daily and valsartan 320 mg daily.

## 2023-12-03 ENCOUNTER — Encounter (HOSPITAL_BASED_OUTPATIENT_CLINIC_OR_DEPARTMENT_OTHER): Payer: Self-pay

## 2023-12-03 DIAGNOSIS — I2510 Atherosclerotic heart disease of native coronary artery without angina pectoris: Secondary | ICD-10-CM

## 2023-12-03 NOTE — Telephone Encounter (Signed)
[place] to order echo 

## 2023-12-04 ENCOUNTER — Encounter: Payer: Self-pay | Admitting: Cardiovascular Disease

## 2023-12-05 LAB — HM DIABETES EYE EXAM

## 2023-12-10 ENCOUNTER — Ambulatory Visit: Admitting: Family Medicine

## 2023-12-10 ENCOUNTER — Encounter: Payer: Self-pay | Admitting: Family Medicine

## 2023-12-10 VITALS — BP 118/68 | HR 68 | Temp 98.6°F | Ht 72.0 in | Wt 326.0 lb

## 2023-12-10 DIAGNOSIS — I2510 Atherosclerotic heart disease of native coronary artery without angina pectoris: Secondary | ICD-10-CM | POA: Diagnosis not present

## 2023-12-10 DIAGNOSIS — Z7984 Long term (current) use of oral hypoglycemic drugs: Secondary | ICD-10-CM | POA: Diagnosis not present

## 2023-12-10 DIAGNOSIS — I10 Essential (primary) hypertension: Secondary | ICD-10-CM

## 2023-12-10 DIAGNOSIS — E1159 Type 2 diabetes mellitus with other circulatory complications: Secondary | ICD-10-CM | POA: Diagnosis not present

## 2023-12-10 NOTE — Assessment & Plan Note (Signed)
 Blood pressure is at goal. Continue metoprolol tartrate 25 mg 1 1/2 tabs (37.5 mg) bid, amlodipine 10 mg daily and valsartan 320 mg daily.

## 2023-12-10 NOTE — Assessment & Plan Note (Signed)
A1c is at goal. Continue metformin 500 mg daily.

## 2023-12-10 NOTE — Assessment & Plan Note (Signed)
 4 vessel CABG last spring. Continue aspirin 81 mg daily and metoprolol tartrate 25 mg 1 1/2 tabs (37.5 mg) bid.

## 2023-12-10 NOTE — Progress Notes (Signed)
 [facility] [address] [place] [address] Dept: [phone] Dept Fax: [phone]  Chronic Care Office Visit  Subjective:    Patient ID: Bryan Brock, male    DOB: May 17, 1956, 68 y.o..   MRN: [id number]  Chief Complaint  Patient presents with   Follow-up    F/u to have form filled out regarding DM for new job.    History of Present Illness:  Patient is in today for reassessment of chronic medical issues.  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 He brings with him a form seekign information regarding his diabetes.  Mr. Franta has a history of coronary artery disease, s/p 4-vessel CABG 12/2022. He is managed with metoprolol tartrate 37.5 mg bid and lipid management. He has no current angina.  Mr. Vallance has a history hypertension. He is currently managed on metoprolol tartrate 25 mg 1 1/2 tabs (37.5 mg) bid, amlodipine 10 mg daily, and valsartan 320 mg daily.   Mr. Rehman has a history of hyperlipidemia. He is managed on rosuvastatin 10 mg daily, omega-3 fatty acids 1 gm daily, and inclisiran (Leqvio) injections every 6 months.   Mr. Mcmillen was diagnosed with Type 2 diabetes in Sept. He is managed on metformin 500 mg daily.   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   OSA (obstructive sleep apnea) 12/19/2021   Primary hypertension 10/29/2021   Morbid obesity with BMI of 40.0-44.9, adult (HCC) 10/17/2021    Bipolar 1 disorder, depressed (HCC) 05/11/2020   History of basal cell carcinoma 11/05/2007   Hyperlipidemia 11/05/2007   Premature ventricular contractions 11/05/2007   History of mitral valve prolapse 11/05/2007   Past Surgical History:  Procedure Laterality Date   APPLICATION OF WOUND VAC N/A 01/20/2023   Procedure: APPLICATION OF WOUND VAC;  Surgeon: Lovett Sox, MD;  [facility];  Service: Thoracic;  Laterality: N/A;   APPLICATION OF WOUND VAC N/A 01/23/2023   Procedure: APPLICATION OF WOUND VAC;  Surgeon: Peggye Form, DO;  [facility];  Service: Plastics;  Laterality: N/A;   APPLICATION OF WOUND VAC N/A 01/29/2023   Procedure: APPLICATION OF WOUND VAC;  Surgeon: Peggye Form, DO;  [facility];  Service: Plastics;  Laterality: N/A;   CORONARY ARTERY BYPASS GRAFT N/A 01/02/2023   Procedure: CORONARY ARTERY BYPASS GRAFTING (CABG) TIMES FOUR USING THE LEFT INTERNAL MAMMARY ARTERY (LIMA) AND BILATERAL GREATER SAPHENOUS VEIN ARTERIES;  Surgeon: [name], MD;  [facility];  Service: Open Heart Surgery;  Laterality: N/A;  Open median sternotomy   CORONARY PRESSURE/FFR STUDY N/A 11/21/2022   Procedure: INTRAVASCULAR PRESSURE WIRE/FFR STUDY;  Surgeon: Orbie Pyo, MD;  [facility];  Service: Cardiovascular;  Laterality: N/A;   HYDROCELE EXCISION Bilateral 03/22/2022   Procedure: HYDROCELECTOMY ADULT;  Surgeon: Noel Christmas, MD;  Location: [facility];  Service: Urology;  Laterality: Bilateral;  2 HRS   INCISION AND DRAINAGE OF WOUND N/A 01/29/2023   Procedure: Sternal  wound irrigation and debridement, placement of myriad and wound VAC change;  Surgeon: Thornell Flirt, DO;  [facility];  Service: Plastics;  Laterality: N/A;   INCISION AND DRAINAGE OF WOUND N/A 01/31/2023   Procedure: IRRIGATION AND DEBRIDEMENT OF STERNAL WOUND;  Surgeon: Thornell Flirt, DO;  [facility];  Service: Plastics;  Laterality: N/A;   LEFT HEART  CATH AND CORONARY ANGIOGRAPHY N/A 11/21/2022   Procedure: LEFT HEART CATH AND CORONARY ANGIOGRAPHY;  Surgeon: Kyra Phy, MD;  [facility];  Service: Cardiovascular;  Laterality: N/A;   MOHS SURGERY  1990   PECTORALIS FLAP Right 01/31/2023   Procedure: RIGHT PECTORALIS FLAP;  Surgeon: Thornell Flirt, DO;  [facility];  Service: Plastics;  Laterality: Right;   STERNAL INCISION RECLOSURE N/A 01/07/2023   Procedure: STERNAL REWIRING;  Surgeon: Bartley Lightning, MD;  [facility];  Service: Thoracic;  Laterality: N/A;   STERNAL WOUND DEBRIDEMENT N/A 01/20/2023   Procedure: DRAIN STERNAL INFECTION;  Surgeon: Shon Downing, MD;  Location: [facility] OR;  Service: Thoracic;  Laterality: N/A;   STERNAL WOUND DEBRIDEMENT N/A 01/23/2023   Procedure: Excision of sternal wound with Myriad;  Surgeon: Thornell Flirt, DO;  [facility];  Service: Plastics;  Laterality: N/A;   STRABISMUS SURGERY Left    TEE WITHOUT CARDIOVERSION N/A 01/02/2023   Procedure: TRANSESOPHAGEAL ECHOCARDIOGRAM;  Surgeon: Bartley Lightning, MD;  Location: [facility] OR;  Service: Open Heart Surgery;  Laterality: N/A;   TONSILLECTOMY AND ADENOIDECTOMY     WISDOM TOOTH EXTRACTION     Family History  Problem Relation Age of Onset   Heart disease Mother    Diabetes Mother    Obesity Mother    Hyperlipidemia Mother    Cancer Father        skin cancer   Heart disease Father    Hyperlipidemia Father    Colon cancer Neg Hx    Esophageal cancer Neg Hx    Rectal cancer Neg Hx    Stomach cancer Neg Hx    Outpatient Medications Prior to Visit  Medication Sig Dispense Refill   albuterol (VENTOLIN HFA) 108 (90 Base) MCG/ACT inhaler Inhale 2 puffs into the lungs every 6 (six) hours as needed for wheezing or shortness of breath. 6.7 g 0   amLODipine (NORVASC) 10 MG tablet Take 1 tablet (10 mg total) by mouth daily. 90 tablet 3   APPLE CIDER VINEGAR PO Take 15 mLs by mouth every Monday, Wednesday, and Friday.      Ascorbic Acid (VITAMIN C) 1000 MG tablet Take 3,000 mg by mouth daily.     aspirin EC 81 MG tablet Take 1 tablet (81 mg total) by mouth daily.     calcium elemental as carbonate (TUMS ULTRA 1000) 400 MG chewable tablet Chew 2,000 mg by mouth 3 (three) times daily as needed for heartburn.     Cholecalciferol (DIALYVITE VITAMIN D 5000) 125 MCG (5000 UT) capsule Take 20,000 Units by mouth daily.     Coenzyme Q10 (COQ-10) 100 MG CAPS Take 100 mg by mouth daily.     Flaxseed, Linseed, (FLAX SEED OIL) 1000 MG CAPS Take 1,000 mg by mouth daily.     folic acid (FOLVITE) 800 MCG tablet Take 800 mcg by mouth daily.     hydrochlorothiazide (HYDRODIURIL) 25 MG tablet Take 1 tablet (25 mg total) by mouth daily. 90 tablet 3   inclisiran (LEQVIO) 284 MG/1.5ML SOSY injection Inject 1.5ml every 6 months  lamoTRIgine (LAMICTAL) 100 MG tablet Take 100 mg by mouth daily.     Liniments (BLUE-EMU SUPER STRENGTH EX) Apply 1 Application topically daily as needed (pain).     magnesium oxide (MAG-OX) 400 (240 Mg) MG tablet Take 400 mg by mouth daily.     metFORMIN (GLUCOPHAGE) 500 MG tablet Take 1 tablet (500 mg total) by mouth daily with breakfast. 90 tablet 3   metoprolol tartrate (LOPRESSOR) 25 MG tablet Take 1.5 tablets (37.5 mg total) by mouth 2 (two) times daily. 270 tablet 3   Multiple Vitamins-Minerals (MULTIVITAMIN WITH MINERALS) tablet Take 1 tablet by mouth daily.     Omega 3 1000 MG CAPS Take 1,000 mg by mouth daily.     OVER THE COUNTER MEDICATION Nano CBD OIL: Pt takes 20 drops in the morning and 20 drops in the evening.     rosuvastatin (CRESTOR) 10 MG tablet Take 1 tablet (10 mg total) by mouth daily. 90 tablet 1   sildenafil (VIAGRA) 100 MG tablet Take 100 mg by mouth daily as needed for erectile dysfunction.     valsartan (DIOVAN) 320 MG tablet Take 1 tablet (320 mg total) by mouth daily. 90 tablet 3   zinc gluconate 50 MG tablet Take 50 mg by mouth daily.     No facility-administered medications  prior to visit.   Allergies  Allergen Reactions   Latex Dermatitis   Lisinopril Cough   Objective:   Today's Vitals   12/10/23 1342  BP: 118/68  Pulse: 68  Temp: 98.6 F (37 C)  TempSrc: Temporal  SpO2: 97%  Weight: (!) 326 lb (147.9 kg)  Height: 6' (1.829 m)   Body mass index is 44.21 kg/m.   General: Well developed, well nourished. No acute distress. Psych: Alert and oriented. Normal mood and affect.  Health Maintenance Due  Topic Date Due   Hepatitis C Screening  Never done   Zoster Vaccines- Shingrix (1 of 2) Never done   Lab Results Last hemoglobin A1c Lab Results  Component Value Date   HGBA1C 6.8 (H) 11/13/2023   Lab Results  Component Value Date   CHOL 105 05/02/2023   HDL 56 05/02/2023   LDLCALC 26 05/02/2023   TRIG 131 05/02/2023   CHOLHDL 1.9 05/02/2023     Assessment & Plan:   Problem List Items Addressed This Visit       Cardiovascular and Mediastinum   Coronary artery disease   4 vessel CABG last spring. Continue aspirin 81 mg daily and metoprolol tartrate 25 mg 1 1/2 tabs (37.5 mg) bid.      Primary hypertension   Blood pressure is at goal. Continue metoprolol tartrate 25 mg 1 1/2 tabs (37.5 mg) bid, amlodipine 10 mg daily and valsartan 320 mg daily.      Type 2 diabetes mellitus with cardiac complication (HCC) - Primary   A1c is at goal. Continue metformin 500 mg daily.       Return for Follow-up as scheduled.   Graig Lawyer, MD

## 2023-12-23 ENCOUNTER — Ambulatory Visit (HOSPITAL_BASED_OUTPATIENT_CLINIC_OR_DEPARTMENT_OTHER)

## 2023-12-23 ENCOUNTER — Encounter (HOSPITAL_BASED_OUTPATIENT_CLINIC_OR_DEPARTMENT_OTHER): Payer: Self-pay

## 2023-12-23 DIAGNOSIS — I2510 Atherosclerotic heart disease of native coronary artery without angina pectoris: Secondary | ICD-10-CM | POA: Diagnosis not present

## 2023-12-23 NOTE — Telephone Encounter (Signed)
 Fyi in provider folder at nurses station

## 2023-12-24 ENCOUNTER — Encounter (HOSPITAL_BASED_OUTPATIENT_CLINIC_OR_DEPARTMENT_OTHER): Payer: Self-pay

## 2023-12-24 ENCOUNTER — Telehealth (HOSPITAL_BASED_OUTPATIENT_CLINIC_OR_DEPARTMENT_OTHER): Payer: Self-pay | Admitting: *Deleted

## 2023-12-24 LAB — ECHOCARDIOGRAM COMPLETE
Area-P 1/2: 3.91 cm2
S' Lateral: 2.44 cm

## 2023-12-24 NOTE — Telephone Encounter (Signed)
See phone note 4/30 .

## 2023-12-24 NOTE — Telephone Encounter (Signed)
 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  Would like this done as soon as you can tomorrow due to pt lives [place] and will have to travel to pick up papers. Will send to MSW to advise.

## 2023-12-25 ENCOUNTER — Encounter (HOSPITAL_BASED_OUTPATIENT_CLINIC_OR_DEPARTMENT_OTHER): Payer: Self-pay

## 2023-12-25 DIAGNOSIS — I10 Essential (primary) hypertension: Secondary | ICD-10-CM

## 2023-12-25 MED ORDER — SPIRONOLACTONE 25 MG PO TABS: 25.0000 mg | ORAL_TABLET | Freq: Every day | ORAL | 3 refills | Status: DC

## 2024-01-02 ENCOUNTER — Other Ambulatory Visit: Payer: Self-pay | Admitting: Nurse Practitioner

## 2024-01-05 ENCOUNTER — Other Ambulatory Visit (HOSPITAL_COMMUNITY): Payer: Self-pay | Admitting: Family Medicine

## 2024-01-06 ENCOUNTER — Encounter (HOSPITAL_BASED_OUTPATIENT_CLINIC_OR_DEPARTMENT_OTHER): Payer: Self-pay

## 2024-01-06 ENCOUNTER — Ambulatory Visit (HOSPITAL_BASED_OUTPATIENT_CLINIC_OR_DEPARTMENT_OTHER): Payer: Self-pay | Admitting: Nurse Practitioner

## 2024-01-06 LAB — BASIC METABOLIC PANEL WITH GFR
BUN/Creatinine Ratio: 15 (ref 10–24)
BUN: 20 mg/dL (ref 8–27)
CO2: 18 mmol/L — ABNORMAL LOW (ref 20–29)
Calcium: 8.3 mg/dL — ABNORMAL LOW (ref 8.6–10.2)
Chloride: 102 mmol/L (ref 96–106)
Creatinine, Ser: 1.3 mg/dL — ABNORMAL HIGH (ref 0.76–1.27)
Glucose: 137 mg/dL — ABNORMAL HIGH (ref 70–99)
Potassium: 4.6 mmol/L (ref 3.5–5.2)
Sodium: 139 mmol/L (ref 134–144)
eGFR: 60 mL/min/{1.73_m2} (ref >59)

## 2024-01-06 NOTE — Telephone Encounter (Signed)
 FYI

## 2024-01-16 ENCOUNTER — Encounter: Payer: Self-pay | Admitting: Family Medicine

## 2024-01-16 ENCOUNTER — Ambulatory Visit (INDEPENDENT_AMBULATORY_CARE_PROVIDER_SITE_OTHER): Admitting: Family Medicine

## 2024-01-16 VITALS — BP 130/70 | HR 85 | Temp 98.0°F | Ht 72.0 in | Wt 325.0 lb

## 2024-01-16 DIAGNOSIS — Z021 Encounter for pre-employment examination: Secondary | ICD-10-CM | POA: Insufficient documentation

## 2024-01-16 DIAGNOSIS — I10 Essential (primary) hypertension: Secondary | ICD-10-CM

## 2024-01-16 DIAGNOSIS — E1159 Type 2 diabetes mellitus with other circulatory complications: Secondary | ICD-10-CM

## 2024-01-16 DIAGNOSIS — I2510 Atherosclerotic heart disease of native coronary artery without angina pectoris: Secondary | ICD-10-CM

## 2024-01-16 DIAGNOSIS — Z7984 Long term (current) use of oral hypoglycemic drugs: Secondary | ICD-10-CM

## 2024-01-16 NOTE — Assessment & Plan Note (Signed)
 Bryan Brock has medically stable. CV disease and diabetes. 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 I see no overt sign of communicable disease. I will check blood titers for MMR and Hepatitis B. I will also check a [employer] to screen for tuberculosis. If these return with expected results, I will provide clearance for his employment.

## 2024-01-16 NOTE — Assessment & Plan Note (Signed)
 Blood pressure is at goal. Continue metoprolol tartrate 25 mg 1 1/2 tabs (37.5 mg) bid, amlodipine 10 mg daily and valsartan 320 mg daily.

## 2024-01-16 NOTE — Progress Notes (Signed)
 [facility] [address] [place] [address] Dept: [phone] Dept Fax: [phone]  Office Visit  Subjective:    Patient ID: Bryan Brock, male    DOB: 26-Jun-1956, 68 y.o..   MRN: [id number]  Chief Complaint  Patient presents with   Employment Physical    Employment physical   History of Present Illness:  Patient is in today for for pre-employment clearance. 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   OSA (obstructive sleep apnea) 12/19/2021   Primary hypertension 10/29/2021   Morbid obesity with BMI of 40.0-44.9, adult (HCC) 10/17/2021   Bipolar 1 disorder, depressed (HCC) 05/11/2020   History of basal cell carcinoma 11/05/2007   Hyperlipidemia 11/05/2007   Premature ventricular contractions 11/05/2007   History of mitral valve prolapse 11/05/2007   Past Surgical History:  Procedure Laterality Date   APPLICATION OF WOUND VAC N/A 01/20/2023   Procedure: APPLICATION OF WOUND VAC;  Surgeon: Shon Downing, MD;  Location: [facility] OR;  Service: Thoracic;  Laterality: N/A;   APPLICATION OF WOUND VAC N/A 01/23/2023   Procedure: APPLICATION OF WOUND VAC;  Surgeon: Thornell Flirt, DO;  Location: [facility] OR;  Service: Plastics;  Laterality: N/A;   APPLICATION OF WOUND VAC N/A  01/29/2023   Procedure: APPLICATION OF WOUND VAC;  Surgeon: Thornell Flirt, DO;  Location: [facility] OR;  Service: Plastics;  Laterality: N/A;   CORONARY ARTERY BYPASS GRAFT N/A 01/02/2023   Procedure: CORONARY ARTERY BYPASS GRAFTING (CABG) TIMES FOUR USING THE LEFT INTERNAL MAMMARY ARTERY (LIMA) AND BILATERAL GREATER SAPHENOUS VEIN ARTERIES;  Surgeon: Bartley Lightning, MD;  Location: [facility] OR;  Service: Open Heart Surgery;  Laterality: N/A;  Open median sternotomy   CORONARY PRESSURE/FFR STUDY N/A 11/21/2022   Procedure: INTRAVASCULAR PRESSURE WIRE/FFR STUDY;  Surgeon: Kyra Phy, MD;  Location: [facility] INVASIVE CV LAB;  Service: Cardiovascular;  Laterality: N/A;   HYDROCELE EXCISION Bilateral 03/22/2022   Procedure: HYDROCELECTOMY ADULT;  Surgeon: Roxane Copp, MD;  Location: [facility] ORS;  Service: Urology;  Laterality: Bilateral;  2 HRS   INCISION AND DRAINAGE OF WOUND N/A 01/29/2023   Procedure: Sternal wound irrigation and debridement, placement of myriad and wound VAC change;  Surgeon: Thornell Flirt, DO;  Location: [facility];  Service: Plastics;  Laterality: N/A;   INCISION AND DRAINAGE OF WOUND N/A 01/31/2023   Procedure: IRRIGATION AND DEBRIDEMENT OF STERNAL WOUND;  Surgeon: Thornell Flirt, DO;  Location: [facility];  Service: Plastics;  Laterality: N/A;   LEFT HEART CATH AND CORONARY ANGIOGRAPHY N/A 11/21/2022   Procedure: LEFT HEART CATH AND CORONARY ANGIOGRAPHY;  Surgeon: Kyra Phy, MD;  Location: [facility];  Service: Cardiovascular;  Laterality: N/A;   MOHS SURGERY  1990   PECTORALIS FLAP Right 01/31/2023   Procedure: RIGHT PECTORALIS FLAP;  Surgeon: Thornell Flirt, DO;  Location: [facility];  Service: Plastics;  Laterality: Right;   STERNAL INCISION RECLOSURE N/A 01/07/2023   Procedure: STERNAL REWIRING;  Surgeon: Bartley Lightning, MD;  Location: [facility];  Service: Thoracic;  Laterality: N/A;   STERNAL WOUND DEBRIDEMENT N/A 01/20/2023   Procedure: DRAIN STERNAL INFECTION;  Surgeon:  Shon Downing, MD;  Location: [facility] OR;  Service: Thoracic;  Laterality: N/A;   STERNAL WOUND DEBRIDEMENT N/A 01/23/2023   Procedure: Excision of sternal wound with Myriad;  Surgeon: Thornell Flirt, DO;  Location: [facility] OR;  Service: Plastics;  Laterality: N/A;   STRABISMUS SURGERY Left    TEE WITHOUT CARDIOVERSION N/A 01/02/2023   Procedure: TRANSESOPHAGEAL ECHOCARDIOGRAM;  Surgeon: Bartley Lightning, MD;  Location: [facility] OR;  Service: Open Heart Surgery;  Laterality: N/A;   TONSILLECTOMY AND ADENOIDECTOMY     WISDOM TOOTH EXTRACTION     Family History  Problem Relation Age of Onset   Heart disease Mother    Diabetes Mother    Obesity Mother    Hyperlipidemia Mother    Cancer Father        skin cancer   Heart disease Father    Hyperlipidemia Father    Colon cancer Neg Hx    Esophageal cancer Neg Hx    Rectal cancer Neg Hx    Stomach cancer Neg Hx    Outpatient Medications Prior to Visit  Medication Sig Dispense Refill   albuterol  (VENTOLIN  HFA) 108 (90 Base) MCG/ACT inhaler Inhale 2 puffs into the lungs every 6 (six) hours as needed for wheezing or shortness of breath. 6.7 g 0   amLODipine  (NORVASC ) 10 MG tablet Take 1 tablet (10 mg total) by mouth daily. 90 tablet 3   APPLE CIDER VINEGAR PO Take 15 mLs by mouth every Monday, Wednesday, and Friday.     Ascorbic Acid (VITAMIN C) 1000 MG tablet Take 3,000 mg by mouth daily.     aspirin  EC 81 MG tablet Take 1 tablet (81 mg total) by mouth daily.     calcium  elemental as carbonate (TUMS ULTRA 1000) 400 MG chewable tablet Chew 2,000 mg by mouth 3 (three) times daily as needed for heartburn.     Cholecalciferol (DIALYVITE VITAMIN D 5000) 125 MCG (5000 UT) capsule Take 20,000 Units by mouth daily.     Coenzyme Q10 (COQ-10) 100 MG CAPS Take 100 mg by mouth daily.     Flaxseed, Linseed, (FLAX SEED OIL) 1000 MG CAPS Take 1,000 mg by mouth daily.     folic acid (FOLVITE) 800 MCG tablet Take 800 mcg by mouth daily.     inclisiran (LEQVIO ) 284  MG/1.5ML SOSY injection Inject 1.5ml every 6 months     lamoTRIgine  (LAMICTAL ) 100 MG tablet Take 100 mg by mouth daily.     Liniments (BLUE-EMU SUPER STRENGTH EX) Apply 1 Application topically daily as needed (pain).     magnesium  oxide (MAG-OX) 400 (240 Mg) MG tablet Take 400 mg by mouth daily.     metFORMIN  (GLUCOPHAGE ) 500 MG tablet Take 1 tablet (500 mg total) by mouth daily with breakfast. 90 tablet 3   metoprolol  tartrate (LOPRESSOR ) 25 MG tablet TAKE 1 AND 1/2 TABLETS(37.5 MG) BY MOUTH TWICE DAILY 270 tablet 1   Multiple Vitamins-Minerals (MULTIVITAMIN WITH MINERALS) tablet Take 1 tablet by mouth daily.     Omega 3 1000 MG CAPS Take 1,000 mg by mouth daily.     OVER THE COUNTER MEDICATION Nano CBD OIL: Pt takes 20 drops in the morning and  20 drops in the evening.     rosuvastatin  (CRESTOR ) 10 MG tablet Take 1 tablet (10 mg total) by mouth daily. 90 tablet 1   sildenafil (VIAGRA) 100 MG tablet Take 100 mg by mouth daily as needed for erectile dysfunction.     spironolactone  (ALDACTONE ) 25 MG tablet Take 1 tablet (25 mg total) by mouth daily. 90 tablet 3   valsartan  (DIOVAN ) 320 MG tablet Take 1 tablet (320 mg total) by mouth daily. 90 tablet 3   zinc  gluconate 50 MG tablet Take 50 mg by mouth daily.     No facility-administered medications prior to visit.   Allergies  Allergen Reactions   Latex Dermatitis   Lisinopril  Cough     Objective:   Today's Vitals   01/16/24 1316  BP: 130/70  Pulse: 85  Temp: 98 F (36.7 C)  TempSrc: Temporal  SpO2: 96%  Weight: (!) 325 lb (147.4 kg)  Height: 6' (1.829 m)   Body mass index is 44.08 kg/m.   General: Well developed, well nourished. No acute distress. Lungs: Clear to auscultation bilaterally. No wheezing, rales or rhonchi. CV: RRR without murmurs or rubs. Pulses 2+ bilaterally. Psych: Alert and oriented. Normal mood and affect.  Health Maintenance Due  Topic Date Due   Hepatitis C Screening  Never done   Zoster Vaccines-  Shingrix (1 of 2) Never done   Medicare Annual Wellness (AWV)  03/09/2024     Assessment & Plan:   Problem List Items Addressed This Visit       Cardiovascular and Mediastinum   Coronary artery disease   4 vessel CABG last spring. Stable without angina. Continue aspirin  81 mg daily and metoprolol  tartrate 25 mg 1 1/2 tabs (37.5 mg) bid.      Primary hypertension   Blood pressure is at goal. Continue metoprolol  tartrate 25 mg 1 1/2 tabs (37.5 mg) bid, amlodipine  10 mg daily and valsartan  320 mg daily.      Type 2 diabetes mellitus with cardiac complication (HCC)   A1c is at goal. Continue metformin  500 mg daily.        Other   Pre-employment examination - Primary   Mr. Claiborne has medically stable. CV disease and diabetes. 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 I see no overt sign of communicable disease. I will check blood titers for MMR and Hepatitis B. I will also check a Quantiferon-Gold to screen for tuberculosis. If these return with expected results, I will provide clearance for his employment.      Relevant Orders   Measles/Mumps/Rubella Immunity   Hepatitis B surface antibody,quantitative   QuantiFERON-TB Gold Plus    Return for Follow-up as scheduled.   Graig Lawyer, MD

## 2024-01-16 NOTE — Assessment & Plan Note (Signed)
 4 vessel CABG last spring. Stable without angina. Continue aspirin  81 mg daily and metoprolol  tartrate 25 mg 1 1/2 tabs (37.5 mg) bid.

## 2024-01-16 NOTE — Assessment & Plan Note (Signed)
A1c is at goal. Continue metformin 500 mg daily.

## 2024-01-20 ENCOUNTER — Ambulatory Visit: Payer: Self-pay | Admitting: Family Medicine

## 2024-01-20 NOTE — Telephone Encounter (Signed)
 Spoke to patient and he is aware that we are waiting on the other results to come in.  Dm/cma

## 2024-01-22 ENCOUNTER — Encounter: Payer: Self-pay | Admitting: Family Medicine

## 2024-01-22 LAB — MEASLES/MUMPS/RUBELLA IMMUNITY
Mumps IgG: 42.4 [AU]/ml
Rubella: 4.88 {index}
Rubeola IgG: >300 [AU]/ml

## 2024-01-22 LAB — QUANTIFERON-TB GOLD PLUS
Mitogen-NIL: 7.59 [IU]/mL
NIL: 0.06 [IU]/mL
QuantiFERON-TB Gold Plus: NEGATIVE
TB1-NIL: 0 [IU]/mL
TB2-NIL: 0 [IU]/mL

## 2024-01-22 LAB — HEPATITIS B SURFACE ANTIBODY, QUANTITATIVE: Hep B S AB Quant (Post): 175 m[IU]/mL (ref >=10)

## 2024-01-22 NOTE — Telephone Encounter (Signed)
 Bryan Brock, called [facility] and was advised that the MMR titer was sent to [place], [place] on 01/17/24 and should be resulted by 8 pm.  Dm/cma

## 2024-01-23 ENCOUNTER — Ambulatory Visit: Admitting: Family Medicine

## 2024-02-09 ENCOUNTER — Telehealth: Payer: Self-pay

## 2024-02-09 DIAGNOSIS — E1159 Type 2 diabetes mellitus with other circulatory complications: Secondary | ICD-10-CM

## 2024-02-09 NOTE — Telephone Encounter (Signed)
 Copied from CRM [id number]. Topic: General - Other >> Feb 09, 2024 10:30 AM Freya Jesus wrote: Reason for CRM: Patient stated he was just seen 01/16/24 for his health examine and nothing has changed since then so he wants to know if he has to come in 02/16/24 for his 3 month follow up. Patient is requesting a call back.

## 2024-02-09 NOTE — Telephone Encounter (Signed)
 Patient notified VIA phone and lab appointment scheduled.  Dm/cma

## 2024-02-09 NOTE — Addendum Note (Signed)
 Addended by: Graig Lawyer on: 02/09/2024 03:00 PM   Modules accepted: Orders

## 2024-02-09 NOTE — Telephone Encounter (Signed)
 Does he need to f/u so soon?   Please review and advise.  Thanks. Dm/cma

## 2024-02-11 ENCOUNTER — Ambulatory Visit: Payer: Self-pay | Admitting: Family Medicine

## 2024-02-11 ENCOUNTER — Other Ambulatory Visit

## 2024-02-11 ENCOUNTER — Other Ambulatory Visit (INDEPENDENT_AMBULATORY_CARE_PROVIDER_SITE_OTHER)

## 2024-02-11 DIAGNOSIS — E1159 Type 2 diabetes mellitus with other circulatory complications: Secondary | ICD-10-CM

## 2024-02-11 LAB — HEMOGLOBIN A1C: Hgb A1c MFr Bld: 7 % — ABNORMAL HIGH (ref 4.6–6.5)

## 2024-02-16 ENCOUNTER — Ambulatory Visit: Admitting: Family Medicine

## 2024-03-15 ENCOUNTER — Ambulatory Visit (INDEPENDENT_AMBULATORY_CARE_PROVIDER_SITE_OTHER): Payer: Medicare Other

## 2024-03-15 DIAGNOSIS — Z0000 Encounter for general adult medical examination without abnormal findings: Secondary | ICD-10-CM | POA: Diagnosis not present

## 2024-03-15 NOTE — Patient Instructions (Signed)
 Mr. Bryan Brock , Thank you for taking time out of your busy schedule to complete your Annual Wellness Visit with me. I enjoyed our conversation and look forward to speaking with you again next year. I, as well as your care team,  appreciate your ongoing commitment to your health goals. Please review the following plan we discussed and let me know if I can assist you in the future. Your Game plan/ To Do List    Referrals: If you haven't heard from the office you've been referred to, please reach out to them at the phone provided.  N/a Follow up Visits: Next Medicare AWV with our clinical staff: 03/21/2025 at 3:00   Have you seen your provider in the last 6 months (3 months if uncontrolled diabetes)? Yes Next Office Visit with your provider: 05/19/2024 at 10:20  Clinician Recommendations:  Aim for 30 minutes of exercise or brisk walking, 6-8 glasses of water, and 5 servings of fruits and vegetables each day.       This is a list of the screening recommended for you and due dates:  Health Maintenance  Topic Date Due   Hepatitis C Screening  Never done   Zoster (Shingles) Vaccine (1 of 2) Never done   Yearly kidney health urinalysis for diabetes  02/18/2028*   Flu Shot  03/26/2024   Complete foot exam   08/11/2024   Hemoglobin A1C  08/12/2024   Eye exam for diabetics  12/04/2024   Yearly kidney function blood test for diabetes  01/04/2025   Medicare Annual Wellness Visit  03/15/2025   Colon Cancer Screening  06/04/2027   DTaP/Tdap/Td vaccine (2 - Td or Tdap) 10/30/2031   Pneumococcal Vaccine for age over 11  Completed   Hepatitis B Vaccine  Aged Out   HPV Vaccine  Aged Out   Meningitis B Vaccine  Aged Out   COVID-19 Vaccine  Discontinued  *Topic was postponed. The date shown is not the original due date.    Advanced directives: (In Chart) A copy of your advanced directives are scanned into your chart should your provider ever need it. Advance Care Planning is important because it:  [x]   Makes sure you receive the medical care that is consistent with your values, goals, and preferences  [x]  It provides guidance to your family and loved ones and reduces their decisional burden about whether or not they are making the right decisions based on your wishes.  Follow the link provided in your after visit summary or read over the paperwork we have mailed to you to help you started getting your Advance Directives in place. If you need assistance in completing these, please reach out to us  so that we can help you!  See attachments for Preventive Care and Fall Prevention Tips.

## 2024-03-15 NOTE — Progress Notes (Signed)
 Subjective:   Bryan Brock is a 68 y.o. who presents for a Medicare Wellness preventive visit.  As a reminder, Annual Wellness Visits don't include a physical exam, and some assessments may be limited, especially if this visit is performed virtually. We may recommend an in-person follow-up visit with your provider if needed.  Visit Complete: Virtual I connected with  Bryan Brock on 03/15/24 by a audio enabled telemedicine application and verified that I am speaking with the correct person using two identifiers.  Patient Location: Home  Provider Location: Office/Clinic  I discussed the limitations of evaluation and management by telemedicine. The patient expressed understanding and agreed to proceed.  Vital Signs: Because this visit was a virtual/telehealth visit, some criteria may be missing or patient reported. Any vitals not documented were not able to be obtained and vitals that have been documented are patient reported.  VideoError- Librarian, academic were attempted between this provider and patient, however failed, due to patient having technical difficulties OR patient did not have access to video capability.  We continued and completed visit with audio only.   Persons Participating in Visit: Patient.  AWV Questionnaire: Yes: Patient Medicare AWV questionnaire was completed by the patient on 03/11/2024; I have confirmed that all information answered by patient is correct and no changes since this date.  Cardiac Risk Factors include: advanced age (>68men, >19 women);dyslipidemia;hypertension;male gender     Objective:    Today's Vitals   There is no height or weight on file to calculate BMI.     03/15/2024    3:10 PM 03/10/2023   10:55 AM 02/04/2023    5:00 PM 01/31/2023    3:00 PM 01/29/2023    3:00 PM 01/05/2023    3:03 AM 12/31/2022   11:31 AM  Advanced Directives  Does Patient Have a Medical Advance Directive? Yes Yes Yes Yes Yes Yes Yes  Type  of Estate agent of [place];Living will Healthcare Power of [place];Living will Healthcare Power of [employer] of [place];Living will Healthcare Power of [place];Living will Healthcare Power of [place];Living will Healthcare Power of [place];Living will  Does patient want to make changes to medical advance directive?   No - Guardian declined  No - Guardian declined No - Patient declined No - Guardian declined  Copy of Healthcare Power of Attorney in Chart? Yes - validated most recent copy scanned in chart (See row information) Yes - validated most recent copy scanned in chart (See row information) Yes - validated most recent copy scanned in chart (See row information) Yes - validated most recent copy scanned in chart (See row information) No - copy requested  Yes - validated most recent copy scanned in chart (See row information)    Current Medications (verified) Outpatient Encounter Medications as of 03/15/2024  Medication Sig   albuterol  (VENTOLIN  HFA) 108 (90 Base) MCG/ACT inhaler Inhale 2 puffs into the lungs every 6 (six) hours as needed for wheezing or shortness of breath.   amLODipine  (NORVASC ) 10 MG tablet Take 1 tablet (10 mg total) by mouth daily.   APPLE CIDER VINEGAR PO Take 15 mLs by mouth every Monday, Wednesday, and Friday.   Ascorbic Acid (VITAMIN C) 1000 MG tablet Take 3,000 mg by mouth daily.   aspirin  EC 81 MG tablet Take 1 tablet (81 mg total) by mouth daily.   calcium  elemental as carbonate (TUMS ULTRA 1000) 400 MG chewable tablet Chew 2,000 mg by mouth 3 (three) times daily as  needed for heartburn.   Cholecalciferol (DIALYVITE VITAMIN D 5000) 125 MCG (5000 UT) capsule Take 20,000 Units by mouth daily.   Coenzyme Q10 (COQ-10) 100 MG CAPS Take 100 mg by mouth daily.   Flaxseed, Linseed, (FLAX SEED OIL) 1000 MG CAPS Take 1,000 mg by mouth daily.   folic acid (FOLVITE) 800 MCG tablet Take 800 mcg by mouth daily.   inclisiran (LEQVIO ) 284  MG/1.5ML SOSY injection Inject 1.5ml every 6 months   lamoTRIgine  (LAMICTAL ) 100 MG tablet Take 100 mg by mouth daily.   Liniments (BLUE-EMU SUPER STRENGTH EX) Apply 1 Application topically daily as needed (pain).   magnesium  oxide (MAG-OX) 400 (240 Mg) MG tablet Take 400 mg by mouth daily.   metFORMIN  (GLUCOPHAGE ) 500 MG tablet Take 1 tablet (500 mg total) by mouth daily with breakfast.   metoprolol  tartrate (LOPRESSOR ) 25 MG tablet TAKE 1 AND 1/2 TABLETS(37.5 MG) BY MOUTH TWICE DAILY   Multiple Vitamins-Minerals (MULTIVITAMIN WITH MINERALS) tablet Take 1 tablet by mouth daily.   Omega 3 1000 MG CAPS Take 1,000 mg by mouth daily.   rosuvastatin  (CRESTOR ) 10 MG tablet Take 1 tablet (10 mg total) by mouth daily.   sildenafil (VIAGRA) 100 MG tablet Take 100 mg by mouth daily as needed for erectile dysfunction.   spironolactone  (ALDACTONE ) 25 MG tablet Take 1 tablet (25 mg total) by mouth daily.   valsartan  (DIOVAN ) 320 MG tablet Take 1 tablet (320 mg total) by mouth daily.   zinc  gluconate 50 MG tablet Take 50 mg by mouth daily.   OVER THE COUNTER MEDICATION Nano CBD OIL: Pt takes 20 drops in the morning and 20 drops in the evening.   No facility-administered encounter medications on file as of 03/15/2024.    Allergies (verified) Latex and Lisinopril    History: Past Medical History:  Diagnosis Date   Acne    ADHD (attention deficit hyperactivity disorder)    Allergy 1963   Allergic to certain elements, such as long-hair pets, hay...   Arthritis    Asthma    Bipolar 1 disorder (HCC)    Cancer (HCC)    basal cell cancer removed from back   Cataract    Coronary artery disease    Depression    Dyspnea    Dysrhythmia    PVCs   GERD (gastroesophageal reflux disease)    Heart murmur    Hepatitis    remote hx Hepatitis A (caused by food contaminant in childhood)   History of hiatal hernia    Hyperlipidemia    Hypertension    Mitral valve prolapse    Panic attacks    Peripheral  vascular disease (HCC)    PFO (patent foramen ovale)    ? 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 [place] in [place]   Past Surgical History:  Procedure Laterality Date   APPLICATION OF WOUND VAC N/A 01/20/2023   Procedure: APPLICATION OF WOUND VAC;  Surgeon: Obadiah Coy, MD;  Location: [facility];  Service: Thoracic;  Laterality: N/A;   APPLICATION OF WOUND VAC N/A 01/23/2023   Procedure: APPLICATION OF WOUND VAC;  Surgeon: Lowery Estefana RAMAN, DO;  Location: [facility];  Service: Plastics;  Laterality: N/A;   APPLICATION OF WOUND VAC N/A 01/29/2023   Procedure: APPLICATION OF WOUND VAC;  Surgeon: Lowery Estefana RAMAN, DO;  Location: [facility];  Service: Government social research officer;  Laterality: N/A;   CORONARY ARTERY BYPASS GRAFT N/A 01/02/2023   Procedure: CORONARY ARTERY BYPASS GRAFTING (CABG) TIMES FOUR USING THE LEFT INTERNAL MAMMARY ARTERY (LIMA) AND BILATERAL GREATER SAPHENOUS VEIN ARTERIES;  Surgeon: Lucas Dorise POUR, MD;  Location: [facility];  Service: Open Heart Surgery;  Laterality: N/A;  Open median sternotomy   CORONARY PRESSURE/FFR STUDY N/A 11/21/2022   Procedure: INTRAVASCULAR PRESSURE WIRE/FFR STUDY;  Surgeon: Wendel Lurena POUR, MD;  [facility]: [facility];  Service: Cardiovascular;  Laterality: N/A;   EYE SURGERY  1958   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 Perfect ou   HYDROCELE EXCISION Bilateral 03/22/2022   Procedure: HYDROCELECTOMY ADULT;  Surgeon: Elisabeth Valli BIRCH, MD;  Location: [facility];  Service: Urology;  Laterality: Bilateral;  2 HRS   INCISION AND DRAINAGE OF WOUND N/A 01/29/2023   Procedure: Sternal wound irrigation and debridement, placement of myriad and wound VAC change;  Surgeon: Lowery Estefana RAMAN, DO;  Location: [facility];  Service: Plastics;  Laterality: N/A;   INCISION AND DRAINAGE OF WOUND N/A 01/31/2023   Procedure: IRRIGATION AND  DEBRIDEMENT OF STERNAL WOUND;  Surgeon: Lowery Estefana RAMAN, DO;  Location: [facility];  Service: Plastics;  Laterality: N/A;   LEFT HEART CATH AND CORONARY ANGIOGRAPHY N/A 11/21/2022   Procedure: LEFT HEART CATH AND CORONARY ANGIOGRAPHY;  Surgeon: Wendel Lurena POUR, MD;  [facility];  Service: Cardiovascular;  Laterality: N/A;   MOHS SURGERY  1990   PECTORALIS FLAP Right 01/31/2023   Procedure: RIGHT PECTORALIS FLAP;  Surgeon: Lowery Estefana RAMAN, DO;  [facility];  Service: Plastics;  Laterality: Right;   STERNAL INCISION RECLOSURE N/A 01/07/2023   Procedure: STERNAL REWIRING;  Surgeon: Lucas Dorise POUR, MD;  [facility];  Service: Thoracic;  Laterality: N/A;   STERNAL WOUND DEBRIDEMENT N/A 01/20/2023   Procedure: DRAIN STERNAL INFECTION;  Surgeon: Obadiah Coy, MD;  Location[facility] OR;  Service: Thoracic;  Laterality: N/A;   STERNAL WOUND DEBRIDEMENT N/A 01/23/2023   Procedure: Excision of sternal wound with Myriad;  Surgeon: Lowery Estefana RAMAN, DO;  [facility]: [facility];  Service: Plastics;  Laterality: N/A;   STRABISMUS SURGERY Left    TEE WITHOUT CARDIOVERSION N/A 01/02/2023   Procedure: TRANSESOPHAGEAL ECHOCARDIOGRAM;  Surgeon: Lucas Dorise POUR, MD;  Location: [facility] OR;  Service: Open Heart Surgery;  Laterality: N/A;   TONSILLECTOMY AND ADENOIDECTOMY     WISDOM TOOTH EXTRACTION     Family History  Problem Relation Age of Onset   Heart disease Mother    Diabetes Mother    Obesity Mother    Hyperlipidemia Mother    Miscarriages / [place] Mother    Cancer Father        skin cancer   Heart disease Father    Hyperlipidemia Father    Alcohol abuse Father    Early death Father    Colon cancer Neg Hx    Esophageal cancer Neg Hx    Rectal cancer Neg Hx    Stomach cancer Neg Hx    Social History   Socioeconomic History   Marital status: Media planner    Spouse name: Not on file   Number of children: 2   Years of education: Not on file   Highest education  level: Bachelor's degree (e.g., BA, AB, BS)  Occupational History   Occupation: Horticulturist, commercial    Comment: Part-Time  Tobacco Use   Smoking status: Never   Smokeless tobacco: Current  Types: Snuff  Vaping Use   Vaping status: Never Used  Substance and Sexual Activity   Alcohol use: Yes    Alcohol/week: 1.0 - 2.0 standard drink of alcohol    Types: 1 - 2 Standard drinks or equivalent per week   Drug use: Never   Sexual activity: Yes  Other Topics Concern   Not on file  Social History Narrative   Not on file   Social Drivers of Health   Financial Resource Strain: Low Risk  (03/11/2024)   Overall Financial Resource Strain (CARDIA)    Difficulty of Paying Living Expenses: Not hard at all  Food Insecurity: No Food Insecurity (03/11/2024)   Hunger Vital Sign    Worried About Running Out of Food in the Last Year: Never true    Ran Out of Food in the Last Year: Never true  Transportation Needs: No Transportation Needs (03/11/2024)   PRAPARE - Administrator, Civil Service (Medical): No    Lack of Transportation (Non-Medical): No  Physical Activity: Insufficiently Active (03/11/2024)   Exercise Vital Sign    Days of Exercise per Week: 2 days    Minutes of Exercise per Session: 20 min  Stress: No Stress Concern Present (03/11/2024)   [employer] of Occupational Health - Occupational Stress Questionnaire    Feeling of Stress: Not at all  Social Connections: Socially Integrated (03/11/2024)   Social Connection and Isolation Panel    Frequency of Communication with Friends and Family: More than three times a week    Frequency of Social Gatherings with Friends and Family: Three times a week    Attends Religious Services: More than 4 times per year    Active Member of Clubs or Organizations: Yes    Attends Engineer, structural: More than 4 times per year    Marital Status: Living with partner    Tobacco Counseling Ready to quit: Not Answered Counseling  given: Not Answered    Clinical Intake:  Pre-visit preparation completed: Yes  Pain : No/denies pain     Nutritional Risks: None Diabetes: No  Lab Results  Component Value Date   HGBA1C 7.0 (H) 02/11/2024   HGBA1C 6.8 (H) 11/13/2023   HGBA1C 6.6 (H) 08/12/2023     How often do you need to have someone help you when you read instructions, pamphlets, or other written materials from your doctor or pharmacy?: 1 - Never  Interpreter Needed?: No  Information entered by :: NAllen LPN   Activities of Daily Living     03/11/2024    9:18 AM  In your present state of health, do you have any difficulty performing the following activities:  Hearing? 0  Vision? 0  Difficulty concentrating or making decisions? 0  Walking or climbing stairs? 1  Comment bone on bone right knee  Dressing or bathing? 0  Doing errands, shopping? 0  Preparing Food and eating ? N  Using the Toilet? N  In the past six months, have you accidently leaked urine? Y  Do you have problems with loss of bowel control? N  Managing your Medications? N  Managing your Finances? N  Housekeeping or managing your Housekeeping? N    Patient Care Team: Thedora Garnette HERO, MD as PCP - General (Family Medicine) Verlin Lonni BIRCH, MD as PCP - Cardiology (Cardiology) Filbert Maiden, MD as Psychiatrist (Psychiatry) Shlomo Wilbert SAUNDERS, MD as Consulting Physician (Sleep Medicine) Hobart Powell BRAVO, MD (Inactive) as Consulting Physician (Cardiology) Charmayne Molly, MD as  Consulting Physician (Ophthalmology)  I have updated your Care Teams any recent Medical Services you may have received from other providers in the past year.     Assessment:   This is a routine wellness examination for Bryan Brock.  Hearing/Vision screen Hearing Screening - Comments:: Denies hearing issues Vision Screening - Comments:: Regular eye exams, [facility]   Goals Addressed             This Visit's Progress    Patient Stated        03/15/2024, wants to lose weight and exercise       Depression Screen     03/15/2024    3:14 PM 08/12/2023   10:31 AM 06/16/2023    2:05 PM 05/09/2023    1:55 PM 03/10/2023   10:57 AM 10/31/2022    9:55 AM 03/05/2022   11:40 AM  PHQ 2/9 Scores  PHQ - 2 Score 0 0 0 0 0 0 0  PHQ- 9 Score    0 0      Fall Risk     03/11/2024    9:18 AM 08/12/2023   10:31 AM 06/16/2023    2:05 PM 05/09/2023    1:55 PM 03/06/2023   10:31 AM  Fall Risk   Falls in the past year? 0 1 0 0 0  Number falls in past yr: 0 0 0 0 0  Injury with Fall? 0 0 0 0 0  Risk for fall due to : Medication side effect;Impaired mobility History of fall(s)  No Fall Risks Medication side effect  Follow up Falls prevention discussed;Falls evaluation completed Falls evaluation completed  Falls evaluation completed Falls prevention discussed;Falls evaluation completed    MEDICARE RISK AT HOME:  Medicare Risk at Home Any stairs in or around the home?: (Patient-Rptd) Yes If so, are there any without handrails?: (Patient-Rptd) No Home free of loose throw rugs in walkways, pet beds, electrical cords, etc?: (Patient-Rptd) Yes Adequate lighting in your home to reduce risk of falls?: (Patient-Rptd) Yes Life alert?: (Patient-Rptd) No Use of a cane, walker or w/c?: (Patient-Rptd) No Grab bars in the bathroom?: (Patient-Rptd) Yes Shower chair or bench in shower?: (Patient-Rptd) Yes Elevated toilet seat or a handicapped toilet?: (Patient-Rptd) Yes  TIMED UP AND GO:  Was the test performed?  No  Cognitive Function: 6CIT completed        03/15/2024    3:16 PM 03/10/2023   10:59 AM  6CIT Screen  What Year? 0 points 0 points  What month? 0 points 0 points  What time? 0 points 0 points  Count back from 20 0 points 0 points  Months in reverse 0 points 0 points  Repeat phrase 0 points 0 points  Total Score 0 points 0 points    Immunizations Immunization History  Administered Date(s) Administered   PFIZER(Purple  Top)SARS-COV-2 Vaccination 04/06/2020   PNEUMOCOCCAL CONJUGATE-20 10/29/2021   Tdap 10/29/2021    Screening Tests Health Maintenance  Topic Date Due   Hepatitis C Screening  Never done   Zoster Vaccines- Shingrix (1 of 2) Never done   Diabetic kidney evaluation - Urine ACR  02/18/2028 (Originally 01/17/1974)   INFLUENZA VACCINE  03/26/2024   FOOT EXAM  08/11/2024   HEMOGLOBIN A1C  08/12/2024   OPHTHALMOLOGY EXAM  12/04/2024   Diabetic kidney evaluation - eGFR measurement  01/04/2025   Medicare Annual Wellness (AWV)  03/15/2025   Colonoscopy  06/04/2027   DTaP/Tdap/Td (2 - Td or Tdap) 10/30/2031   Pneumococcal Vaccine: 50+  Years  Completed   Hepatitis B Vaccines  Aged Out   HPV VACCINES  Aged Out   Meningococcal B Vaccine  Aged Out   COVID-19 Vaccine  Discontinued    Health Maintenance  Health Maintenance Due  Topic Date Due   Hepatitis C Screening  Never done   Zoster Vaccines- Shingrix (1 of 2) Never done   Health Maintenance Items Addressed: Due for shingles vaccine. Due for Hep C screening.  Additional Screening:  Vision Screening: Recommended annual ophthalmology exams for early detection of glaucoma and other disorders of the eye. Would you like a referral to an eye doctor? No    Dental Screening: Recommended annual dental exams for proper oral hygiene  Community Resource Referral / Chronic Care Management: CRR required this visit?  No   CCM required this visit?  No   Plan:    I have personally reviewed and noted the following in the patient's chart:   Medical and social history Use of alcohol, tobacco or illicit drugs  Current medications and supplements including opioid prescriptions. Patient is not currently taking opioid prescriptions. Functional ability and status Nutritional status Physical activity Advanced directives List of other physicians Hospitalizations, surgeries, and ER visits in previous 12 months Vitals Screenings to include  cognitive, depression, and falls Referrals and appointments  In addition, I have reviewed and discussed with patient certain preventive protocols, quality metrics, and best practice recommendations. A written personalized care plan for preventive services as well as general preventive health recommendations were provided to patient.   Bryan FORBES Dawn, LPN   [id number]   After Visit Summary: (MyChart) Due to this being a telephonic visit, the after visit summary with patients personalized plan was offered to patient via MyChart   Notes: Nothing significant to report at this time.

## 2024-03-31 ENCOUNTER — Encounter (HOSPITAL_BASED_OUTPATIENT_CLINIC_OR_DEPARTMENT_OTHER): Payer: Self-pay

## 2024-04-01 ENCOUNTER — Other Ambulatory Visit (HOSPITAL_COMMUNITY): Payer: Self-pay

## 2024-04-01 ENCOUNTER — Telehealth: Payer: Self-pay | Admitting: Pharmacy Technician

## 2024-04-01 ENCOUNTER — Encounter (HOSPITAL_BASED_OUTPATIENT_CLINIC_OR_DEPARTMENT_OTHER): Payer: Self-pay

## 2024-04-01 ENCOUNTER — Telehealth: Payer: Self-pay | Admitting: Nurse Practitioner

## 2024-04-01 ENCOUNTER — Encounter (HOSPITAL_BASED_OUTPATIENT_CLINIC_OR_DEPARTMENT_OTHER): Payer: Self-pay | Admitting: Nurse Practitioner

## 2024-04-01 ENCOUNTER — Ambulatory Visit: Payer: Medicare Other | Admitting: *Deleted

## 2024-04-01 ENCOUNTER — Encounter: Payer: Self-pay | Admitting: Cardiovascular Disease

## 2024-04-01 ENCOUNTER — Ambulatory Visit (INDEPENDENT_AMBULATORY_CARE_PROVIDER_SITE_OTHER): Admitting: Nurse Practitioner

## 2024-04-01 VITALS — BP 136/78 | HR 88 | Ht 71.5 in | Wt 326.4 lb

## 2024-04-01 VITALS — BP 128/72 | HR 88 | Temp 98.2°F | Resp 22 | Ht 71.0 in | Wt 328.4 lb

## 2024-04-01 DIAGNOSIS — I5030 Unspecified diastolic (congestive) heart failure: Secondary | ICD-10-CM

## 2024-04-01 DIAGNOSIS — E785 Hyperlipidemia, unspecified: Secondary | ICD-10-CM | POA: Diagnosis not present

## 2024-04-01 DIAGNOSIS — I2510 Atherosclerotic heart disease of native coronary artery without angina pectoris: Secondary | ICD-10-CM | POA: Diagnosis not present

## 2024-04-01 DIAGNOSIS — R5383 Other fatigue: Secondary | ICD-10-CM | POA: Diagnosis not present

## 2024-04-01 DIAGNOSIS — I10 Essential (primary) hypertension: Secondary | ICD-10-CM

## 2024-04-01 DIAGNOSIS — R0609 Other forms of dyspnea: Secondary | ICD-10-CM

## 2024-04-01 DIAGNOSIS — Z951 Presence of aortocoronary bypass graft: Secondary | ICD-10-CM

## 2024-04-01 MED ORDER — INCLISIRAN SODIUM 284 MG/1.5ML ~~LOC~~ SOSY
284.0000 mg | PREFILLED_SYRINGE | Freq: Once | SUBCUTANEOUS | Status: AC
Start: 2024-04-01 — End: 2024-04-01
  Administered 2024-04-01: 284 mg via SUBCUTANEOUS
  Filled 2024-04-01: qty 1.5

## 2024-04-01 MED ORDER — TIRZEPATIDE-WEIGHT MANAGEMENT 5 MG/0.5ML ~~LOC~~ SOLN: 5.0000 mg | SUBCUTANEOUS | 1 refills | Status: DC

## 2024-04-01 NOTE — Telephone Encounter (Signed)
 Would you please check prices/PA on GLP1-agonist. He would prefer Zepbound  if possible, his sister has had great results on it. A1C 7%, hx CABG  Thank you, Rosaline

## 2024-04-01 NOTE — Progress Notes (Signed)
 Diagnosis: Hyperlipidemia  Provider:  Mannam, Praveen MD  Procedure: Injection  Leqvio  (inclisiran), Dose: 284 mg, Site: subcutaneous, Number of injections: 1  Injection Site(s): Left lower quad. abdomen  Post Care: Patient declined observation  Discharge: Condition: Good, Destination: Home . AVS Provided  Performed by:  Mathew Therisa NOVAK, RN

## 2024-04-01 NOTE — Telephone Encounter (Signed)
 Pharmacy Patient Advocate Encounter  Received notification from [employer] that Prior Authorization for zepbound  has been DENIED.  Full denial letter will be uploaded to the media tab. See denial reason below.   Even though it was submitted for OSA it denied for weight loss reasons:

## 2024-04-01 NOTE — Patient Instructions (Signed)
 Medication Instructions:   Your physician recommends that you continue on your current medications as directed. Please refer to the Current Medication list given to you today.   *If you need a refill on your cardiac medications before your next appointment, please call your pharmacy*  Lab Work:  Please get your labs.  If you have labs (blood work) drawn today and your tests are completely normal, you will receive your results only by: MyChart Message (if you have MyChart) OR A paper copy in the mail If you have any lab test that is abnormal or we need to change your treatment, we will call you to review the results.  Testing/Procedures:  None ordered.   Follow-Up: At [facility], you and your health needs are our priority.  As part of our continuing mission to provide you with exceptional heart care, our providers are all part of one team.  This team includes your primary Cardiologist (physician) and Advanced Practice Providers or APPs (Physician Assistants and Nurse Practitioners) who all work together to provide you with the care you need, when you need it.  Your next appointment:   To be determined upon lab results.

## 2024-04-01 NOTE — Progress Notes (Signed)
 Cardiology Office Note:    Date:  04/01/2024   ID:  Bryan Brock, DOB 1955-12-25, MRN [id number]  PCP:  Bryan Garnette HERO, MD   [facility] Providers Cardiologist:  Lonni Cash, MD     Referring MD: Bryan Garnette HERO, MD   Chief Complaint: evaluation of fatigue  History of Present Illness:    Bryan Brock is a very pleasant loquacious 68 y.o. male with a hx of MVP, symptomatic PVCs, hypertension, hyperlipidemia, family hx CAD, and morbid obesity.    He was referred to cardiology for evaluation of MVP.  Diagnosed at age 1 to 68 years old.  Previously seen by Dr. Elaine.  Around that time he had sudden syncope episode.  Described prodrome of warm rashes, lightheadedness, and arrhythmias.  TTE at that time with mild MVP.  Holter per report with no significant arrhythmias.  Was then followed by Dr. Burnard but has not been seen in several years.  Seen by Dr. Hobart on 11/08/21. BP elevated at PCP 180/100.  EKGs showed possible LAE and RVH. Due to significant hypertension, pedal edema, and abnormal EKG he was referred to cardiology to assess for heart failure prior to surgery. Seen in follow-up by PCP on 10/29/2021 BP was 162/94. Was started on 10-12.5 mg lisinopril -HCTZ daily. 5 episodes of palpitations or skipped beats.  Episodes lasting 30 minutes to several hours and may wake him at night, 2-4 times per month. Voiced concerns about his weight of 303 pounds in clinic. Wanted to pursue weight reduction program and exercise, however having shortness of breath and elevated heart rates with minimal exertion and limited by issues with right knee. Cardiac testing was ordered in order to clear patient for surgery. Sleep test revealed very mild OSA. Referred to Dr. Shlomo for sleep management. Nuclear stress test showed no evidence of ischemia or infarction. Echo revealed normal LVEF, G1DD, mild LVH, no rwma, elongated mitral valve leaflets with no obvious prolapse.  He was subsequently cleared  for surgery on 12/31/21.  Seen in clinic by me on 02/14/22 to complete surgical clearance for hydrocelectomy scheduled for late July. BP readings systolic average 859d, diastolic 80s-90s. No further palpitations. Continues to work part-time.  Activity is limited by right knee pain - bone on bone, will eventually need replacement.  Continues to have dyspnea with exertion, no chest pain. Feels that DOE is secondary to his weight. Was told CPAP not needed for mild OSA.   Seen in clinic by Dr. Hobart on 07/29/22 at which time he had gained 12-16 lbs. Palpitations occurring infrequently but lasting for hours at the time. Encouraged to get [place] device. BP medication adjustments - stopped lisinopril -hctz and advised to take lisinopril  40 mg daily, hctz 25 mg daily, and increase amlodipine  to 5 mg daily. Lengthy discussion about the importance of weight loss. Advised to follow-up in 3 months.  Seen in clinic by me on 10/28/22. Having cough, thought to be 2/2 lisinopril . BP has been well controlled. Infrequent palpitations, has Kardia monitor at home. Continuing to have dyspnea on exertion. 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 Feels very fatigued at the end of shift, like he cannot do anything else. Bilateral LE 1+ pitting edema. No chest pain, presyncope, syncope, orthopnea, and PND. Advised to stop lisinopril  and start valsartan  320 mg to see if cough improves. Scheduled for coronary CTA to evaluate for ischemia which revealed coronary calcium  score of 2487 (97%), severe stenosis mid RCA, distal  Cx.  Recommendation to undergo cardiac catheterization.    LHC 11/21/2022 revealed to have vessel disease consisting of serial right coronary artery lesions, high-grade left circumflex disease RFR positive LAD disease, and moderate second diagonal disease.  He underwent CABG x 4 on 01/02/2023 with (LIMA-LAD, SVG-Diag 2, SVG-OM, SVG-PDA). Unfortunately, he had a lengthy hospitalization following a  hard cough that caused broken ribs, sternal fracture, and wound dehiscence. Was taken back to OR for rewiring and subsequent additional surgeries for wound healing and right pectoralis muscle flap. Was hospitalized total of 40 days.  Seen in clinic by me on 04/01/23. Feeling well. Seen by plastic surgery PA today, wound healing is on track. Numbness across upper chest, getting some feeling back. No evidence of a fib since initial presentation during hospitalization. He is feeling well, working outside for hours during the day, doing Aeronautical engineer and house projects. No chest pain, shortness of breath, orthopnea, PND, edema, presyncope, or syncope. Recently, having symptoms of hot flashes and itching. This causes difficulty sleeping which is abnormal for him, previously sleeping 6+ hours nightly. He discussed with PCP who reported typical cause was thyroid  but his thyroid  function had been normal. We reviewed each of his medications. He has lost 30 lbs with healthy diet and exercise. Plans to participate in cardiac rehab in [place].   Seen by Dr. Verlin 05/30/23 at which time BP was elevated. He was advised to increase amlodipine  to 10 mg daily.  He was tolerating Leqvio  and Crestor  without concerning side effects with LDL of 26.  EKG revealed sinus rhythm.  He was advised to follow-up in 12 months.  Lasix  was changed to hydrochlorothiazide  for better BP control.   Echocardiogram 12/25/2023 revealed mildly impaired wall motion consistent with prior CABG. LVEF 50-55%, mild diastolic dysfunction, mild LVH, no significant valve disease.  He was advised to discontinue hydrochlorothiazide  and start spironolactone  25 mg daily.  Discussed the use of AI scribe software for clinical note transcription with the patient, who gave verbal consent to proceed.  History of Present Illness Bryan Brock is a very pleasant 68 year old male who is here for evaluation of persistent fatigue and decreased stamina. Simple tasks  like walking across the yard exhaust him, and his energy levels have not returned to pre-surgery levels. He underwent an extensive recovery as noted above with a total of 5 additional surgeries on his sternum. He describes ongoing discomfort in his chest with movement, feeling loose pieces of bone rubbing against each other, which limits physical activities. He completed cardiac rehab at home and at hospital in [place], but struggles with low energy levels. Admits he has not continued regular exercise. Blood pressure averages 130-150/70-80 mmHg. One recent episode of palpitations with HR that reached 120-140 beats per minute but did not feel irregular when he was laying down. This has not reoccurred. He drinks a gallon and a half of water daily and has noted weight gain, attributed to poor eating habits and lack of exercise. He is frustrated with his current physical limitations. No bleeding concerns.  He denies orthopnea, PND, presyncope, syncope.  He does have mild LE edema.   Past Medical History:  Diagnosis Date   Acne    ADHD (attention deficit hyperactivity disorder)    Allergy 1963   Allergic to certain elements, such as long-hair pets, hay...   Arthritis    Asthma    Bipolar 1 disorder (HCC)    Cancer (HCC)    basal cell cancer removed from  back   Cataract    Coronary artery disease    Depression    Dyspnea    Dysrhythmia    PVCs   GERD (gastroesophageal reflux disease)    Heart murmur    Hepatitis    remote hx Hepatitis A (caused by food contaminant in childhood)   History of hiatal hernia    Hyperlipidemia    Hypertension    Mitral valve prolapse    Panic attacks    Peripheral vascular disease (HCC)    PFO (patent foramen ovale)    ? 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 [place] in [place]    Past Surgical History:  Procedure Laterality  Date   APPLICATION OF WOUND VAC N/A 01/20/2023   Procedure: APPLICATION OF WOUND VAC;  Surgeon: Obadiah Coy, MD;  Location: [facility] OR;  Service: Thoracic;  Laterality: N/A;   APPLICATION OF WOUND VAC N/A 01/23/2023   Procedure: APPLICATION OF WOUND VAC;  Surgeon: Lowery Estefana RAMAN, DO;  Location: [facility] OR;  Service: Plastics;  Laterality: N/A;   APPLICATION OF WOUND VAC N/A 01/29/2023   Procedure: APPLICATION OF WOUND VAC;  Surgeon: Lowery Estefana RAMAN, DO;  Location: [facility] OR;  Service: Plastics;  Laterality: N/A;   CORONARY ARTERY BYPASS GRAFT N/A 01/02/2023   Procedure: CORONARY ARTERY BYPASS GRAFTING (CABG) TIMES FOUR USING THE LEFT INTERNAL MAMMARY ARTERY (LIMA) AND BILATERAL GREATER SAPHENOUS VEIN ARTERIES;  Surgeon: Lucas Dorise POUR, MD;  Location: [facility] OR;  Service: Open Heart Surgery;  Laterality: N/A;  Open median sternotomy   CORONARY PRESSURE/FFR STUDY N/A 11/21/2022   Procedure: INTRAVASCULAR PRESSURE WIRE/FFR STUDY;  Surgeon: Wendel Lurena POUR, MD;  Location: [facility];  Service: Cardiovascular;  Laterality: N/A;   EYE SURGERY  1958   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 Perfect ou   HYDROCELE EXCISION Bilateral 03/22/2022   Procedure: HYDROCELECTOMY ADULT;  Surgeon: Elisabeth Valli BIRCH, MD;  Location: [facility];  Service: Urology;  Laterality: Bilateral;  2 HRS   INCISION AND DRAINAGE OF WOUND N/A 01/29/2023   Procedure: Sternal wound irrigation and debridement, placement of myriad and wound VAC change;  Surgeon: Lowery Estefana RAMAN, DO;  Location: [facility];  Service: Plastics;  Laterality: N/A;   INCISION AND DRAINAGE OF WOUND N/A 01/31/2023   Procedure: IRRIGATION AND DEBRIDEMENT OF STERNAL WOUND;  Surgeon: Lowery Estefana RAMAN, DO;  Location: [facility];  Service: Plastics;  Laterality: N/A;   LEFT HEART CATH AND CORONARY ANGIOGRAPHY N/A 11/21/2022   Procedure: LEFT HEART CATH AND CORONARY ANGIOGRAPHY;  Surgeon: Wendel Lurena POUR, MD;  [facility];  Service:  Cardiovascular;  Laterality: N/A;   MOHS SURGERY  1990   PECTORALIS FLAP Right 01/31/2023   Procedure: RIGHT PECTORALIS FLAP;  Surgeon: Lowery Estefana RAMAN, DO;  Location: [facility];  Service: Plastics;  Laterality: Right;   STERNAL INCISION RECLOSURE N/A 01/07/2023   Procedure: STERNAL REWIRING;  Surgeon: Lucas Dorise POUR, MD;  Location: [facility];  Service: Thoracic;  Laterality: N/A;   STERNAL WOUND DEBRIDEMENT N/A 01/20/2023   Procedure: DRAIN STERNAL INFECTION;  Surgeon: Obadiah Coy, MD;  Location: [facility] OR;  Service: Thoracic;  Laterality: N/A;   STERNAL WOUND DEBRIDEMENT N/A 01/23/2023   Procedure: Excision of sternal wound with [place];  Surgeon: Lowery Estefana RAMAN, DO;  Location: [facility];  Service: Government social research officer;  Laterality: N/A;   STRABISMUS SURGERY Left    TEE WITHOUT CARDIOVERSION N/A 01/02/2023   Procedure: TRANSESOPHAGEAL ECHOCARDIOGRAM;  Surgeon: Lucas Dorise POUR, MD;  Location[facility];  Service: Open Heart Surgery;  Laterality: N/A;   TONSILLECTOMY AND ADENOIDECTOMY     WISDOM TOOTH EXTRACTION      Current Medications: Current Meds  Medication Sig   albuterol  (VENTOLIN  HFA) 108 (90 Base) MCG/ACT inhaler Inhale 2 puffs into the lungs every 6 (six) hours as needed for wheezing or shortness of breath.   amLODipine  (NORVASC ) 10 MG tablet Take 1 tablet (10 mg total) by mouth daily.   APPLE CIDER VINEGAR PO Take 15 mLs by mouth every Monday, Wednesday, and Friday.   Ascorbic Acid (VITAMIN C) 1000 MG tablet Take 3,000 mg by mouth daily.   aspirin  EC 81 MG tablet Take 1 tablet (81 mg total) by mouth daily.   calcium  elemental as carbonate (TUMS ULTRA 1000) 400 MG chewable tablet Chew 2,000 mg by mouth 3 (three) times daily as needed for heartburn.   Cholecalciferol (DIALYVITE VITAMIN D 5000) 125 MCG (5000 UT) capsule Take 20,000 Units by mouth daily.   Coenzyme Q10 (COQ-10) 100 MG CAPS Take 100 mg by mouth daily.   Flaxseed, Linseed, (FLAX SEED OIL) 1000 MG CAPS Take 1,000 mg by mouth daily.    folic acid (FOLVITE) 800 MCG tablet Take 800 mcg by mouth daily.   inclisiran (LEQVIO ) 284 MG/1.5ML SOSY injection Inject 1.5ml every 6 months   lamoTRIgine  (LAMICTAL ) 100 MG tablet Take 100 mg by mouth daily.   Liniments (BLUE-EMU SUPER STRENGTH EX) Apply 1 Application topically daily as needed (pain).   magnesium  oxide (MAG-OX) 400 (240 Mg) MG tablet Take 400 mg by mouth daily.   metFORMIN  (GLUCOPHAGE ) 500 MG tablet Take 1 tablet (500 mg total) by mouth daily with breakfast.   metoprolol  tartrate (LOPRESSOR ) 25 MG tablet TAKE 1 AND 1/2 TABLETS(37.5 MG) BY MOUTH TWICE DAILY   Multiple Vitamins-Minerals (MULTIVITAMIN WITH MINERALS) tablet Take 1 tablet by mouth daily.   Omega 3 1000 MG CAPS Take 1,000 mg by mouth daily.   rosuvastatin  (CRESTOR ) 10 MG tablet Take 1 tablet (10 mg total) by mouth daily.   sildenafil (VIAGRA) 100 MG tablet Take 100 mg by mouth daily as needed for erectile dysfunction.   spironolactone  (ALDACTONE ) 25 MG tablet Take 1 tablet (25 mg total) by mouth daily.   tirzepatide  5 MG/0.5ML injection vial Inject 5 mg into the skin once a week.   valsartan  (DIOVAN ) 320 MG tablet Take 1 tablet (320 mg total) by mouth daily.   zinc  gluconate 50 MG tablet Take 50 mg by mouth daily.     Allergies:   Latex and Lisinopril    Social History   Socioeconomic History   Marital status: Media planner    Spouse name: Not on file   Number of children: 2   Years of education: Not on file   Highest education level: Bachelor's degree (e.g., BA, AB, BS)  Occupational History   Occupation: Horticulturist, commercial    Comment: Part-Time  Tobacco Use   Smoking status: Never   Smokeless tobacco: Current    Types: Snuff  Vaping Use   Vaping status: Never Used  Substance and Sexual Activity   Alcohol use: Yes    Alcohol/week: 1.0 - 2.0 standard drink of alcohol    Types: 1 - 2 Standard drinks or equivalent per week   Drug use: Never   Sexual activity:  Yes  Other Topics Concern   Not on  file  Social History Narrative   Not on file   Social Drivers of Health   Financial Resource Strain: Low Risk  (03/11/2024)   Overall Financial Resource Strain (CARDIA)    Difficulty of Paying Living Expenses: Not hard at all  Food Insecurity: No Food Insecurity (03/11/2024)   Hunger Vital Sign    Worried About Running Out of Food in the Last Year: Never true    Ran Out of Food in the Last Year: Never true  Transportation Needs: No Transportation Needs (03/11/2024)   PRAPARE - Administrator, Civil Service (Medical): No    Lack of Transportation (Non-Medical): No  Physical Activity: Insufficiently Active (03/11/2024)   Exercise Vital Sign    Days of Exercise per Week: 2 days    Minutes of Exercise per Session: 20 min  Stress: No Stress Concern Present (03/11/2024)   [employer] of Occupational Health - Occupational Stress Questionnaire    Feeling of Stress: Not at all  Social Connections: Socially Integrated (03/11/2024)   Social Connection and Isolation Panel    Frequency of Communication with Friends and Family: More than three times a week    Frequency of Social Gatherings with Friends and Family: Three times a week    Attends Religious Services: More than 4 times per year    Active Member of Clubs or Organizations: Yes    Attends Engineer, structural: More than 4 times per year    Marital Status: Living with partner     Family History: The patient's family history includes Alcohol abuse in his father; Cancer in his father; Diabetes in his mother; Early death in his father; Heart disease in his father and mother; Hyperlipidemia in his father and mother; Miscarriages / [place] in his mother; Obesity in his mother. There is no history of Colon cancer, Esophageal cancer, Rectal cancer, or Stomach cancer.  ROS:   Please see the history of present illness.    + fatigue All other systems reviewed and are negative.   Labs/Other Studies Reviewed:    The  following studies were reviewed today:  LHC 11/21/22  Prox Cx lesion is 80% stenosed.   1st Mrg lesion is 60% stenosed.   Mid LAD lesion is 60% stenosed.   2nd Diag lesion is 60% stenosed.   Prox RCA-1 lesion is 80% stenosed.   Prox RCA-2 lesion is 80% stenosed.   Dist RCA lesion is 80% stenosed.   1.  Multivessel disease consisting of serial right coronary artery lesions, high-grade left circumflex disease, RFR positive LAD disease, and moderate second diagonal disease. 2.  LVEDP of .   Recommendation: Outpatient cardiothoracic surgical evaluation.  Recent Labs: 05/02/2023: ALT 32 01/05/2024: BUN 20; Creatinine, Ser 1.30; Potassium 4.6; Sodium 139  Recent Lipid Panel    Component Value Date/Time   CHOL 105 05/02/2023 0844   TRIG 131 05/02/2023 0844   HDL 56 05/02/2023 0844   CHOLHDL 1.9 05/02/2023 0844   CHOLHDL 4 10/17/2021 1705   VLDL 22.2 10/17/2021 1705   LDLCALC 26 05/02/2023 0844     Risk Assessment/Calculations:      Physical Exam:    VS:  BP 136/78   Pulse 88   Ht 5' 11.5 (1.816 m)   Wt (!) 326 lb 6.4 oz (148.1 kg)   SpO2 97%   BMI 44.89 kg/m     Wt Readings from Last 3 Encounters:  04/01/24 (!) 328  lb 6.4 oz (149 kg)  04/01/24 (!) 326 lb 6.4 oz (148.1 kg)  01/16/24 (!) 325 lb (147.4 kg)     GEN: Obese gentleman in no acute distress HEENT: Normal NECK: No JVD; No carotid bruits CARDIAC: RRR, no murmurs, rubs, gallops RESPIRATORY:  Coarse breath sounds bilaterally with expiratory wheezing   ABDOMEN: Soft, non-tender, protuberant MUSCULOSKELETAL:  No edema; No deformity. 2+ pedal pulses, equal bilaterally SKIN: Warm and dry NEUROLOGIC:  Alert and oriented x 3 PSYCHIATRIC:  Normal affect   EKG:    EKG Interpretation Date/Time:  Thursday April 01 2024 11:35:28 EDT Ventricular Rate:  80 PR Interval:  172 QRS Duration:  98 QT Interval:  374 QTC Calculation: 431 R Axis:   80  Text Interpretation: Normal sinus rhythm Normal ECG When  compared with ECG of 05-Jan-2023 00:58, Sinus rhythm has replaced Atrial fibrillation Vent. rate has decreased BY  47 BPM Non-specific change in ST segment in Inferior leads Nonspecific T wave abnormality no longer evident in Inferior leads T wave inversion now evident in Anterior leads Confirmed by Percy Browning [phone]) on 04/01/2024 11:45:29 AM         Diagnoses:    1. Coronary artery disease involving native coronary artery of native heart without angina pectoris   2. Essential hypertension   3. Other fatigue   4. Heart failure with preserved ejection fraction, unspecified HF chronicity (HCC)   5. DOE (dyspnea on exertion)   6. Hyperlipidemia LDL goal <55   7. Decreased stamina   8. S/P CABG x 4     Assessment & Plan CAD S/p CABG x 4 on 01/02/23 He had post CABG complications including sternal dehiscence with lengthy hospitalization and 5 subsequent surgeries. Since that time, he has struggled with feeling bone on bone in his sternum.  He feels this has limited his activity and he is frustrated by persistent fatigue and decreased stamina.  He completed home rehab as well as cardiac re[facility] at [facility].  Reports I don't feel like there is a problem with my heart.  He denies chest pain, dyspnea, or other symptoms concerning for angina.  No indication for further ischemia evaluation at this time.  EKG reveals normal sinus rhythm at 80 bpm, nonspecific T wave abnormality, no acute concerns.  No return of a fib to his awareness. Admits deconditioning and obesity likely contributing factors. No bleeding concerns. He is curious about a specialist that may be able to further repair his sternum.  -Recommend gradual increase in activity to aim for at least 30 minutes of exercise most days -Weight loss recommended by following a heart healthy, mostly whole food diet limiting saturated fat, sugar, simple carbohydrates, and processed food -As noted below, we will r/o possible contributing  factors - Continue GDMT for management of CAD including inclisiran, metoprolol , amlodipine , rosuvastatin , valsartan , aspirin , spironolactone  -Pursue GLP1 agonist therapy for weight loss and CV benefit  Fatigue  Decreased stamina   He experiences persistent fatigue and decreased stamina.  He is now 1 year post CABG but had complications that required multiple surgeries on his sternum.  He had postop A-fib but no return of A-fib to his awareness.  EKG today reveals sinus rhythm.  BP is stable.  Differential diagnosis includes thyroid  dysfunction, anemia, lack of exercise, and weight gain. The impact of prolonged recovery was discussed.  - We will get CBC as well as iron panel to evaluate for anemia  - Check thyroid  function  - Check liver and kidney  function  -Encouraged gradual increase in activity to improve endurance  -He asks if one of our pharmacists can review his medication list for potential interactions - will send message to Pharm D team for assistance  Hyperlipidemia LDL goal < 55 Lipid panel completed 05/02/2023 with total cholesterol 105, HDL 56, LDL 26, and triglycerides 868.  He is tolerating Leqvio  without concerning side effects.  No problems with rosuvastatin . - Continue rosuvastatin  and Leqvio   Obesity   Type 2 diabetes  A1c 7.0% on 02/11/2024.  He is managing diabetes with metformin .  We discussed the role and potential benefit of GLP-1 agonist for weight loss and blood sugar management.  States he understands that his weight may be contributing to fatigue and decreased stamina.   -Benefits of weight loss medication and the importance of exercise were discussed -Will ask pharmacy team to review benefits for coverage of GLP-1 agonist  -Whole food diet limiting sugar, saturated fat, simple carbohydrates and processed food  Chronic HFpEF DOE Echo 12/24/2023 with mildly reduced LVEF 50 to 55%, mild LVH, G1 DD, normal RV, no significant valve disease. Mild LE edema and chronic DOE  are stable. He feels weight contributes to these but as noted above he feels fatigue and stamina are out of proportion for this point in his recovery.  BP is well-controlled.  Weight is stable.  He denies orthopnea, PND, chest pain. - Continue GDMT for HFpEF including valsartan , spironolactone , metoprolol   -Check BMET to ensure stable renal function  Hypertension   BP is well controlled today. Home BP average at home 140s over 70-80 diastolic.  He has not noted episodes of hypotension.  We will ensure stable renal function today.   - Continue amlodipine , valsartan , spironolactone , metoprolol   - Continue to monitor BP routinely with goal < 130/80    Disposition: TBD based on lab results  Medication Adjustments/Labs and Tests Ordered: Current medicines are reviewed at length with the patient today.  Concerns regarding medicines are outlined above.  Orders Placed This Encounter  Procedures   CBC   TSH   T4, free   T3, free   Comp Met (CMET)   Iron, TIBC and Ferritin Panel   EKG 12-Lead   Meds ordered this encounter  Medications   tirzepatide  5 MG/0.5ML injection vial    Sig: Inject 5 mg into the skin once a week.    Dispense:  4 mL    Refill:  1    Supervising Provider:   LONNI SLAIN [[id number]]    Patient Instructions  Medication Instructions:   Your physician recommends that you continue on your current medications as directed. Please refer to the Current Medication list given to you today.   *If you need a refill on your cardiac medications before your next appointment, please call your pharmacy*  Lab Work:  Please get your labs.  If you have labs (blood work) drawn today and your tests are completely normal, you will receive your results only by: MyChart Message (if you have MyChart) OR A paper copy in the mail If you have any lab test that is abnormal or we need to change your treatment, we will call you to review the results.  Testing/Procedures:  None  ordered.   Follow-Up: At [facility], you and your health needs are our priority.  As part of our continuing mission to provide you with exceptional heart care, our providers are all part of one team.  This team includes your primary Cardiologist (physician) and  Advanced Practice Providers or APPs (Physician Assistants and Nurse Practitioners) who all work together to provide you with the care you need, when you need it.  Your next appointment:   To be determined upon lab results.           Signed, Percy Rosaline HERO, NP  04/01/2024 6:01 PM    [facility]

## 2024-04-01 NOTE — Telephone Encounter (Signed)
 Pharmacy Patient Advocate Encounter  Received notification from [facility] that Prior Authorization for mounjaro  2.5mg  has been APPROVED from 04/01/24 to 08/25/2098 Per the test claim cost would be 669.93 for 1 month. 555.70 went to his deductible and he had a 115.43 coinsurance copay. I called the patient and asked him if the mounjaro  was prescribed, if he would like assistance with the copay. I offered him the medicare payment plan. The PAN foundation is closed for mounjaro  and zepbound  but if he was prescribed ozempic  we could apply for assistance with novo nordisk(I checked and he does qualify per their income qualifications). He said he is not interested in ozempic  since a friend of his had major side effects from it. He said he would like to just hold off right now on these meds since he is also worried about the side effects he heard comes from mounjaro  and zepbound . I gave him my number in case he changed his mind and we could look further into other assistance options.   Sent provider message in other encounter

## 2024-04-01 NOTE — Telephone Encounter (Signed)
 From pt calls  Pharmacy Patient Advocate Encounter   Received notification from Pt Calls Messages that prior authorization for zepbound  5mg  is required/requested.   Insurance verification completed.   The patient is insured through [facility] .   Per test claim: PA required; PA submitted to above mentioned insurance via latent Key/confirmation #/EOC [id number] Status is pending   Mounjaro , ozempic  and zepbound  all came back needing a pa on test claim. But zepbound  also said non-formulary on test claim.

## 2024-04-01 NOTE — Telephone Encounter (Signed)
 Hi, even though the zepbound  was submitted for OSA the prior authorization was denied for weight loss reasons. I just submitted a prior authorization for mounjaro  since they said there is better weight loss data with mounjaro .   Update: They approved the mounjaro  from 04/01/24-08/25/2098. Per the test claim cost would be 669.93 for 1 month. 555.70 went to his deductible and he had a 115.43 coinsurance copay. I called the patient and asked him if the mounjaro  was prescribed, if he would like assistance with the copay. I offered him the medicare payment plan. The PAN foundation is closed for mounjaro  and zepbound  but if he was prescribed ozempic  we could apply for assistance with novo nordisk(I checked and he does qualify per their income qualifications). He said he is not interested in ozempic  since a friend of his had major side effects from it. He said he would like to just hold off right now on these meds since he is also worried about the side effects he heard comes from mounjaro  and zepbound . I gave him my number in case he changed his mind and we could look further into other assistance options.    Pharmacy Patient Advocate Encounter  Received notification from [employer] that Prior Authorization for zepbound  has been DENIED.  Full denial letter will be uploaded to the media tab. See denial reason below.

## 2024-04-02 ENCOUNTER — Encounter (HOSPITAL_BASED_OUTPATIENT_CLINIC_OR_DEPARTMENT_OTHER): Payer: Self-pay

## 2024-04-03 LAB — T4, FREE: Free T4: 1.07 ng/dL (ref 0.82–1.77)

## 2024-04-03 LAB — COMPREHENSIVE METABOLIC PANEL WITH GFR
ALT: 53 IU/L — ABNORMAL HIGH (ref 0–44)
AST: 55 IU/L — ABNORMAL HIGH (ref 0–40)
Albumin: 4.1 g/dL (ref 3.9–4.9)
Alkaline Phosphatase: 113 IU/L (ref 44–121)
BUN/Creatinine Ratio: 14 (ref 10–24)
BUN: 18 mg/dL (ref 8–27)
Bilirubin Total: 0.5 mg/dL (ref 0.0–1.2)
CO2: 20 mmol/L (ref 20–29)
Calcium: 9.8 mg/dL (ref 8.6–10.2)
Chloride: 101 mmol/L (ref 96–106)
Creatinine, Ser: 1.28 mg/dL — ABNORMAL HIGH (ref 0.76–1.27)
Globulin, Total: 1.9 g/dL (ref 1.5–4.5)
Glucose: 197 mg/dL — ABNORMAL HIGH (ref 70–99)
Potassium: 4.6 mmol/L (ref 3.5–5.2)
Sodium: 139 mmol/L (ref 134–144)
Total Protein: 6 g/dL (ref 6.0–8.5)
eGFR: 61 mL/min/1.73 (ref >59)

## 2024-04-03 LAB — IRON,TIBC AND FERRITIN PANEL
Ferritin: 501 ng/mL — ABNORMAL HIGH (ref 30–400)
Iron Saturation: 34 % (ref 15–55)
Iron: 94 ug/dL (ref 38–169)
Total Iron Binding Capacity: 273 ug/dL (ref 250–450)
UIBC: 179 ug/dL (ref 111–343)

## 2024-04-03 LAB — CBC
Hematocrit: 43.5 % (ref 37.5–51.0)
Hemoglobin: 14 g/dL (ref 13.0–17.7)
MCH: 31.5 pg (ref 26.6–33.0)
MCHC: 32.2 g/dL (ref 31.5–35.7)
MCV: 98 fL — ABNORMAL HIGH (ref 79–97)
Platelets: 186 x10E3/uL (ref 150–450)
RBC: 4.44 x10E6/uL (ref 4.14–5.80)
RDW: 12.8 % (ref 11.6–15.4)
WBC: 6.1 x10E3/uL (ref 3.4–10.8)

## 2024-04-03 LAB — T3, FREE: T3, Free: 3 pg/mL (ref 2.0–4.4)

## 2024-04-03 LAB — TSH: TSH: 1.33 u[IU]/mL (ref 0.450–4.500)

## 2024-04-04 ENCOUNTER — Ambulatory Visit: Payer: Self-pay | Admitting: Nurse Practitioner

## 2024-04-06 MED ORDER — HYDRALAZINE HCL 25 MG PO TABS: 25.0000 mg | ORAL_TABLET | Freq: Three times a day (TID) | ORAL | 3 refills | Status: DC

## 2024-04-21 ENCOUNTER — Other Ambulatory Visit (HOSPITAL_BASED_OUTPATIENT_CLINIC_OR_DEPARTMENT_OTHER): Payer: Self-pay | Admitting: *Deleted

## 2024-04-21 ENCOUNTER — Other Ambulatory Visit: Payer: Self-pay | Admitting: Family Medicine

## 2024-04-21 DIAGNOSIS — I10 Essential (primary) hypertension: Secondary | ICD-10-CM

## 2024-04-21 MED ORDER — METOPROLOL TARTRATE 25 MG PO TABS: 37.5000 mg | ORAL_TABLET | Freq: Two times a day (BID) | ORAL | 3 refills | Status: AC

## 2024-04-28 MED ORDER — HYDRALAZINE HCL 50 MG PO TABS: 50.0000 mg | ORAL_TABLET | Freq: Three times a day (TID) | ORAL | 3 refills | Status: AC

## 2024-05-19 ENCOUNTER — Ambulatory Visit (INDEPENDENT_AMBULATORY_CARE_PROVIDER_SITE_OTHER): Admitting: Family Medicine

## 2024-05-19 ENCOUNTER — Encounter: Payer: Self-pay | Admitting: Family Medicine

## 2024-05-19 ENCOUNTER — Ambulatory Visit: Payer: Self-pay | Admitting: Family Medicine

## 2024-05-19 VITALS — BP 120/66 | HR 112 | Temp 97.8°F | Ht 71.0 in | Wt 328.2 lb

## 2024-05-19 DIAGNOSIS — E785 Hyperlipidemia, unspecified: Secondary | ICD-10-CM | POA: Diagnosis not present

## 2024-05-19 DIAGNOSIS — I2510 Atherosclerotic heart disease of native coronary artery without angina pectoris: Secondary | ICD-10-CM

## 2024-05-19 DIAGNOSIS — E1159 Type 2 diabetes mellitus with other circulatory complications: Secondary | ICD-10-CM

## 2024-05-19 DIAGNOSIS — I10 Essential (primary) hypertension: Secondary | ICD-10-CM | POA: Diagnosis not present

## 2024-05-19 DIAGNOSIS — Z7984 Long term (current) use of oral hypoglycemic drugs: Secondary | ICD-10-CM

## 2024-05-19 LAB — GLUCOSE, RANDOM: Glucose, Bld: 235 mg/dL — ABNORMAL HIGH (ref 70–99)

## 2024-05-19 LAB — HEMOGLOBIN A1C: Hgb A1c MFr Bld: 7.6 % — ABNORMAL HIGH (ref 4.6–6.5)

## 2024-05-19 MED ORDER — METFORMIN HCL 500 MG PO TABS: 500.0000 mg | ORAL_TABLET | Freq: Every day | ORAL | 3 refills | Status: DC

## 2024-05-19 MED ORDER — METFORMIN HCL 500 MG PO TABS: 500.0000 mg | ORAL_TABLET | Freq: Two times a day (BID) | ORAL | 3 refills | Status: AC

## 2024-05-19 NOTE — Progress Notes (Signed)
 [facility] [address] [place] [address] Dept: [phone] Dept Fax: [phone]  Chronic Care Office Visit  Subjective:    Patient ID: Bryan Brock, male    DOB: [birth year], 68 y.o..   MRN: [id number]  Chief Complaint  Patient presents with   Diabetes    3 month f/u DM.     History of Present Illness:  Patient is in today for reassessment of chronic medical issues.  Mr. Bryan Brock has a history of coronary artery disease, s/p 4-vessel CABG 12/2022. He is managed with aspirin  81 mg daily, metoprolol  tartrate 37.5 mg bid and lipid management. He has no current angina.   Mr. Bryan Brock has a history hypertension. He is currently managed on metoprolol  tartrate 25 mg 1 1/2 tabs (37.5 mg) bid, amlodipine  10 mg daily, and valsartan  320 mg daily.   Mr. Bryan Brock has a history of hyperlipidemia. He is managed on rosuvastatin  10 mg daily, omega-3 fatty acids 1 gm daily, and inclisiran (Leqvio ) injections every 6 months.   Mr. Bryan Brock was diagnosed with Type 2 diabetes in Sept. He is managed on metformin  500 mg daily.   Mr. Bryan Brock suffered a mallet finger injury recently. He is working with Dr. Camella and currently has this in a splint.  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   OSA (obstructive sleep apnea) 12/19/2021   Primary hypertension 10/29/2021   Morbid obesity with BMI of 40.0-44.9, adult (HCC) 10/17/2021   Bipolar 1 disorder, depressed (HCC) 05/11/2020   History of basal cell carcinoma 11/05/2007   Hyperlipidemia 11/05/2007   Premature ventricular contractions 11/05/2007   History of mitral valve prolapse 11/05/2007   Past Surgical History:   Procedure Laterality Date   APPLICATION OF WOUND VAC N/A 01/20/2023   Procedure: APPLICATION OF WOUND VAC;  Surgeon: Obadiah Coy, MD;  Location: [facility] OR;  Service: Thoracic;  Laterality: N/A;   APPLICATION OF WOUND VAC N/A 01/23/2023   Procedure: APPLICATION OF WOUND VAC;  Surgeon: Lowery Estefana RAMAN, DO;  Location: [facility] OR;  Service: Plastics;  Laterality: N/A;   APPLICATION OF WOUND VAC N/A 01/29/2023   Procedure: APPLICATION OF WOUND VAC;  Surgeon: Lowery Estefana RAMAN, DO;  Location: [facility] OR;  Service: Plastics;  Laterality: N/A;   CORONARY ARTERY BYPASS GRAFT N/A 01/02/2023   Procedure: CORONARY ARTERY BYPASS GRAFTING (CABG) TIMES FOUR USING THE LEFT INTERNAL MAMMARY ARTERY (LIMA) AND BILATERAL GREATER SAPHENOUS VEIN ARTERIES;  Surgeon: Lucas Dorise POUR, MD;  Location: [facility] OR;  Service: Open Heart Surgery;  Laterality: N/A;  Open median sternotomy   CORONARY PRESSURE/FFR STUDY N/A 11/21/2022   Procedure: INTRAVASCULAR PRESSURE WIRE/FFR STUDY;  Surgeon: Wendel Lurena POUR, MD;  Location: [facility] INV[facility];  Service: Cardiovascular;  Laterality: N/A;   EYE SURGERY  1958   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 Perfect ou   HYDROCELE EXCISION Bilateral 03/22/2022   Procedure: HYDROCELECTOMY ADULT;  Surgeon: Elisabeth Valli BIRCH, MD;  Location: [facility] ORS;  Service: Urology;  Laterality: Bilateral;  2 HRS   INCISION AND DRAINAGE OF WOUND N/A 01/29/2023   Procedure: Sternal wound irrigation and debridement, placement of myriad and wound VAC change;  Surgeon: Lowery Estefana RAMAN, DO;  Location: [facility] OR;  Service: Plastics;  Laterality: N/A;   INCISION AND DRAINAGE OF WOUND N/A 01/31/2023   Procedure: IRRIGATION AND DEBRIDEMENT OF STERNAL WOUND;  Surgeon: Lowery Estefana RAMAN, DO;  Location: [facility];  Service: Plastics;  Laterality: N/A;   LEFT HEART CATH AND CORONARY ANGIOGRAPHY N/A 11/21/2022   Procedure: LEFT HEART CATH AND CORONARY ANGIOGRAPHY;  Surgeon: Wendel Lurena POUR, MD;  [facility]: [facility]  INVAS[facility];  Service: Cardiovascular;  Laterality: N/A;   MOHS SURGERY  1990   PECTORALIS FLAP Right 01/31/2023   Procedure: RIGHT PECTORALIS FLAP;  Surgeon: Lowery Estefana RAMAN, DO;  Location: [facility];  Service: Plastics;  Laterality: Right;   STERNAL INCISION RECLOSURE N/A 01/07/2023   Procedure: STERNAL REWIRING;  Surgeon: Lucas Dorise POUR, MD;  Location: [facility];  Service: Thoracic;  Laterality: N/A;   STERNAL WOUND DEBRIDEMENT N/A 01/20/2023   Procedure: DRAIN STERNAL INFECTION;  Surgeon: Obadiah Coy, MD;  Location[facility];  Service: Thoracic;  Laterality: N/A;   STERNAL WOUND DEBRIDEMENT N/A 01/23/2023   Procedure: Excision of sternal wound with Myriad;  Surgeon: Lowery Estefana RAMAN, DO;  Location: [facility];  Service: Plastics;  Laterality: N/A;   STRABISMUS SURGERY Left    TEE WITHOUT CARDIOVERSION N/A 01/02/2023   Procedure: TRANSESOPHAGEAL ECHOCARDIOGRAM;  Surgeon: Lucas Dorise POUR, MD;  Location: [facility] OR;  Service: Open Heart Surgery;  Laterality: N/A;   TONSILLECTOMY AND ADENOIDECTOMY     WISDOM TOOTH EXTRACTION     Family History  Problem Relation Age of Onset   Heart disease Mother    Diabetes Mother    Obesity Mother    Hyperlipidemia Mother    Miscarriages / [place] Mother    Cancer Father        skin cancer   Heart disease Father    Hyperlipidemia Father    Alcohol abuse Father    Early death Father    Colon cancer Neg Hx    Esophageal cancer Neg Hx    Rectal cancer Neg Hx    Stomach cancer Neg Hx    Outpatient Medications Prior to Visit  Medication Sig Dispense Refill   albuterol  (VENTOLIN  HFA) 108 (90 Base) MCG/ACT inhaler Inhale 2 puffs into the lungs every 6 (six) hours as needed for wheezing or shortness of breath. 6.7 g 0   amLODipine  (NORVASC ) 10 MG tablet Take 1 tablet (10 mg total) by mouth daily. 90 tablet 3   APPLE CIDER VINEGAR PO Take 15 mLs by mouth every Monday, Wednesday, and Friday.     Ascorbic Acid (VITAMIN C) 1000 MG tablet Take 3,000 mg  by mouth daily.     aspirin  EC 81 MG tablet Take 1 tablet (81 mg total) by mouth daily.     calcium  elemental as carbonate (TUMS ULTRA 1000) 400 MG chewable tablet Chew 2,000 mg by mouth 3 (three) times daily as needed for heartburn.     Cholecalciferol (DIALYVITE VITAMIN D 5000) 125 MCG (5000 UT) capsule Take 20,000 Units by mouth daily.     Coenzyme Q10 (COQ-10) 100 MG CAPS Take 100 mg by mouth daily.     Flaxseed, Linseed, (FLAX SEED OIL) 1000 MG CAPS Take 1,000 mg by mouth daily.     folic acid (FOLVITE) 800 MCG tablet Take 800 mcg by mouth daily.     hydrALAZINE  (APRESOLINE ) 50 MG tablet Take 1 tablet (50 mg total) by mouth 3 (three) times daily. 270 tablet 3   inclisiran (LEQVIO ) 284 MG/1.5ML SOSY injection Inject  1.5ml every 6 months     lamoTRIgine  (LAMICTAL ) 100 MG tablet Take 100 mg by mouth daily.     Liniments (BLUE-EMU SUPER STRENGTH EX) Apply 1 Application topically daily as needed (pain).     magnesium  oxide (MAG-OX) 400 (240 Mg) MG tablet Take 400 mg by mouth daily.     metoprolol  tartrate (LOPRESSOR ) 25 MG tablet Take 1.5 tablets (37.5 mg total) by mouth 2 (two) times daily. 270 tablet 3   Multiple Vitamins-Minerals (MULTIVITAMIN WITH MINERALS) tablet Take 1 tablet by mouth daily.     Omega 3 1000 MG CAPS Take 1,000 mg by mouth daily.     rosuvastatin  (CRESTOR ) 10 MG tablet Take 1 tablet (10 mg total) by mouth daily. 90 tablet 1   sildenafil (VIAGRA) 100 MG tablet Take 100 mg by mouth daily as needed for erectile dysfunction.     spironolactone  (ALDACTONE ) 25 MG tablet Take 1 tablet (25 mg total) by mouth daily. 90 tablet 3   tirzepatide  5 MG/0.5ML injection vial Inject 5 mg into the skin once a week. 4 mL 1   valsartan  (DIOVAN ) 320 MG tablet TAKE 1 TABLET(320 MG) BY MOUTH DAILY 90 tablet 3   zinc  gluconate 50 MG tablet Take 50 mg by mouth daily.     metFORMIN  (GLUCOPHAGE ) 500 MG tablet Take 1 tablet (500 mg total) by mouth daily with breakfast. 90 tablet 3   No  facility-administered medications prior to visit.   Allergies  Allergen Reactions   Latex Dermatitis   Lisinopril  Cough   Objective:   Today's Vitals   05/19/24 1006  BP: 120/66  Pulse: (!) 112  Temp: 97.8 F (36.6 C)  TempSrc: Temporal  SpO2: 98%  Weight: (!) 328 lb 3.2 oz (148.9 kg)  Height: 5' 11 (1.803 m)   Body mass index is 45.77 kg/m.   General: Well developed, well nourished. No acute distress. Psych: Alert and oriented. Normal mood and affect.  Health Maintenance Due  Topic Date Due   Hepatitis C Screening  Never done   Zoster Vaccines- Shingrix (1 of 2) Never done     Assessment & Plan:   Problem List Items Addressed This Visit       Cardiovascular and Mediastinum   Coronary artery disease - Primary   4 vessel CABG last spring. Stable without angina. Continue aspirin  81 mg daily and metoprolol  tartrate 25 mg 1 1/2 tabs (37.5 mg) bid.      Primary hypertension   Blood pressure is at goal. Continue metoprolol  tartrate 25 mg 1 1/2 tabs (37.5 mg) bid, amlodipine  10 mg daily and valsartan  320 mg daily.      Type 2 diabetes mellitus with cardiac complication (HCC)   A1c is at goal. I will reassess this today. Continue metformin  500 mg daily.      Relevant Medications   metFORMIN  (GLUCOPHAGE ) 500 MG tablet   Other Relevant Orders   Glucose, random   Hemoglobin A1c     Other   Hyperlipidemia   LDL cholesterol at goal. Continue rosuvastatin  10 mg daily, omega-3 fatty acids 1 gm daily, and inclisiran (Leqvio ) injections every 3 months.       Return in about 3 months (around 08/18/2024) for Reassessment.   Garnette CHRISTELLA Simpler, MD

## 2024-05-19 NOTE — Assessment & Plan Note (Signed)
LDL cholesterol at goal. Continue rosuvastatin 10 mg daily, omega-3 fatty acids 1 gm daily, and inclisiran (Leqvio) injections every 3 months.

## 2024-05-19 NOTE — Assessment & Plan Note (Signed)
 4 vessel CABG last spring. Stable without angina. Continue aspirin  81 mg daily and metoprolol  tartrate 25 mg 1 1/2 tabs (37.5 mg) bid.

## 2024-05-19 NOTE — Assessment & Plan Note (Signed)
 Blood pressure is at goal. Continue metoprolol tartrate 25 mg 1 1/2 tabs (37.5 mg) bid, amlodipine 10 mg daily and valsartan 320 mg daily.

## 2024-05-19 NOTE — Assessment & Plan Note (Signed)
 A1c is at goal. I will reassess this today. Continue metformin  500 mg daily.

## 2024-05-25 ENCOUNTER — Encounter: Payer: Self-pay | Admitting: Family Medicine

## 2024-05-25 DIAGNOSIS — G4700 Insomnia, unspecified: Secondary | ICD-10-CM

## 2024-05-26 DIAGNOSIS — G4700 Insomnia, unspecified: Secondary | ICD-10-CM | POA: Insufficient documentation

## 2024-05-26 MED ORDER — TRAZODONE HCL 50 MG PO TABS: 25.0000 mg | ORAL_TABLET | Freq: Every evening | ORAL | 3 refills | Status: DC | PRN

## 2024-05-30 ENCOUNTER — Encounter (HOSPITAL_BASED_OUTPATIENT_CLINIC_OR_DEPARTMENT_OTHER): Payer: Self-pay

## 2024-05-31 ENCOUNTER — Other Ambulatory Visit (HOSPITAL_BASED_OUTPATIENT_CLINIC_OR_DEPARTMENT_OTHER): Payer: Self-pay | Admitting: *Deleted

## 2024-05-31 DIAGNOSIS — I10 Essential (primary) hypertension: Secondary | ICD-10-CM

## 2024-06-09 LAB — BASIC METABOLIC PANEL WITH GFR
BUN/Creatinine Ratio: 16 (ref 10–24)
BUN: 24 mg/dL (ref 8–27)
CO2: 22 mmol/L (ref 20–29)
Calcium: 10.5 mg/dL — ABNORMAL HIGH (ref 8.6–10.2)
Chloride: 98 mmol/L (ref 96–106)
Creatinine, Ser: 1.5 mg/dL — ABNORMAL HIGH (ref 0.76–1.27)
Glucose: 140 mg/dL — ABNORMAL HIGH (ref 70–99)
Potassium: 4.3 mmol/L (ref 3.5–5.2)
Sodium: 138 mmol/L (ref 134–144)
eGFR: 50 mL/min/1.73 — ABNORMAL LOW (ref >59)

## 2024-06-10 ENCOUNTER — Encounter (HOSPITAL_BASED_OUTPATIENT_CLINIC_OR_DEPARTMENT_OTHER): Payer: Self-pay

## 2024-06-10 ENCOUNTER — Ambulatory Visit (HOSPITAL_BASED_OUTPATIENT_CLINIC_OR_DEPARTMENT_OTHER): Payer: Self-pay | Admitting: Family

## 2024-06-10 DIAGNOSIS — Z79899 Other long term (current) drug therapy: Secondary | ICD-10-CM

## 2024-06-11 MED ORDER — FUROSEMIDE 20 MG PO TABS: 20.0000 mg | ORAL_TABLET | ORAL | 3 refills | Status: AC

## 2024-06-14 ENCOUNTER — Encounter (HOSPITAL_BASED_OUTPATIENT_CLINIC_OR_DEPARTMENT_OTHER): Payer: Self-pay

## 2024-06-14 NOTE — Addendum Note (Signed)
 Addended by: Ren Aspinall S on: 06/14/2024 05:07 PM   Modules accepted: Orders

## 2024-06-15 ENCOUNTER — Encounter: Payer: Self-pay | Admitting: Family Medicine

## 2024-06-29 LAB — BASIC METABOLIC PANEL WITH GFR
BUN/Creatinine Ratio: 19 (ref 10–24)
BUN: 26 mg/dL (ref 8–27)
CO2: 20 mmol/L (ref 20–29)
Calcium: 10.1 mg/dL (ref 8.6–10.2)
Chloride: 99 mmol/L (ref 96–106)
Creatinine, Ser: 1.35 mg/dL — ABNORMAL HIGH (ref 0.76–1.27)
Glucose: 195 mg/dL — ABNORMAL HIGH (ref 70–99)
Potassium: 4.7 mmol/L (ref 3.5–5.2)
Sodium: 137 mmol/L (ref 134–144)
eGFR: 57 mL/min/1.73 — ABNORMAL LOW (ref >59)

## 2024-06-30 ENCOUNTER — Ambulatory Visit (HOSPITAL_BASED_OUTPATIENT_CLINIC_OR_DEPARTMENT_OTHER): Payer: Self-pay | Admitting: Family

## 2024-07-03 ENCOUNTER — Encounter (HOSPITAL_BASED_OUTPATIENT_CLINIC_OR_DEPARTMENT_OTHER): Payer: Self-pay

## 2024-07-05 ENCOUNTER — Telehealth: Payer: Self-pay

## 2024-07-05 NOTE — Telephone Encounter (Unsigned)
 Copied from CRM [id number]. Topic: General - Billing Inquiry >> Jul 05, 2024 11:29 AM Ahlexyia S wrote: Reason for CRM: Pt called in stating that he needs a bill to be re coded correctly. Pt also stated that he needs to work through Dr. Thedora and nurse Karna, no one else. 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 Attempted to inform pt of turnaround time and also provide the billing department number but pt declined and stated that wont work I need to speak to Dr. Thedora or nurse Karna about this. Let pt know that I will send message and he will receive a call when someone is available.

## 2024-07-05 NOTE — Telephone Encounter (Signed)
 Patient called in regards to insurance not covering the labs done for the pre-employment.   Bill #'s: [id number]  Claim#  [id number]  #[id number], [id number], [id number], [id number], [phone] (Hep B, Quant TB, MMR)  Called [facility] at [phone], spoke to Maquisha.  Gave her the other Diagnosis codes E11.59, I10, I25.10, Z79.84  She updated the claim and will send to insurance.  It can take up to 30 days to get an answer.  Patient can disregard the bill he received for now. Patient notified. Dm/cma

## 2024-07-05 NOTE — Telephone Encounter (Signed)
 Addressed via separate MyChart encounter  Reche GORMAN Finder, NP

## 2024-07-14 ENCOUNTER — Encounter: Payer: Self-pay | Admitting: Family Medicine

## 2024-07-14 DIAGNOSIS — G4700 Insomnia, unspecified: Secondary | ICD-10-CM

## 2024-07-15 MED ORDER — TRAZODONE HCL 100 MG PO TABS: 50.0000 mg | ORAL_TABLET | Freq: Every evening | ORAL | 11 refills | Status: AC | PRN

## 2024-07-16 ENCOUNTER — Other Ambulatory Visit: Payer: Self-pay

## 2024-07-20 MED ORDER — AMLODIPINE BESYLATE 10 MG PO TABS: 10.0000 mg | ORAL_TABLET | Freq: Every day | ORAL | 2 refills | Status: AC

## 2024-08-14 ENCOUNTER — Encounter (HOSPITAL_BASED_OUTPATIENT_CLINIC_OR_DEPARTMENT_OTHER): Payer: Self-pay

## 2024-08-16 MED ORDER — ROSUVASTATIN CALCIUM 10 MG PO TABS: 10.0000 mg | ORAL_TABLET | Freq: Every day | ORAL | 1 refills | Status: AC

## 2024-08-24 ENCOUNTER — Ambulatory Visit: Payer: Self-pay | Admitting: Family Medicine

## 2024-08-24 ENCOUNTER — Encounter: Payer: Self-pay | Admitting: Family Medicine

## 2024-08-24 ENCOUNTER — Ambulatory Visit: Admitting: Family Medicine

## 2024-08-24 VITALS — BP 118/64 | HR 68 | Temp 98.1°F | Ht 71.0 in | Wt 326.2 lb

## 2024-08-24 DIAGNOSIS — I872 Venous insufficiency (chronic) (peripheral): Secondary | ICD-10-CM | POA: Insufficient documentation

## 2024-08-24 DIAGNOSIS — Z6841 Body Mass Index (BMI) 40.0 and over, adult: Secondary | ICD-10-CM

## 2024-08-24 DIAGNOSIS — I5032 Chronic diastolic (congestive) heart failure: Secondary | ICD-10-CM | POA: Diagnosis not present

## 2024-08-24 DIAGNOSIS — I10 Essential (primary) hypertension: Secondary | ICD-10-CM | POA: Diagnosis not present

## 2024-08-24 DIAGNOSIS — Z7984 Long term (current) use of oral hypoglycemic drugs: Secondary | ICD-10-CM

## 2024-08-24 DIAGNOSIS — N1831 Chronic kidney disease, stage 3a: Secondary | ICD-10-CM | POA: Diagnosis not present

## 2024-08-24 DIAGNOSIS — F319 Bipolar disorder, unspecified: Secondary | ICD-10-CM | POA: Diagnosis not present

## 2024-08-24 DIAGNOSIS — E1159 Type 2 diabetes mellitus with other circulatory complications: Secondary | ICD-10-CM

## 2024-08-24 DIAGNOSIS — I5030 Unspecified diastolic (congestive) heart failure: Secondary | ICD-10-CM | POA: Insufficient documentation

## 2024-08-24 DIAGNOSIS — D225 Melanocytic nevi of trunk: Secondary | ICD-10-CM | POA: Insufficient documentation

## 2024-08-24 DIAGNOSIS — I2510 Atherosclerotic heart disease of native coronary artery without angina pectoris: Secondary | ICD-10-CM

## 2024-08-24 DIAGNOSIS — R748 Abnormal levels of other serum enzymes: Secondary | ICD-10-CM | POA: Diagnosis not present

## 2024-08-24 DIAGNOSIS — E785 Hyperlipidemia, unspecified: Secondary | ICD-10-CM

## 2024-08-24 DIAGNOSIS — L821 Other seborrheic keratosis: Secondary | ICD-10-CM | POA: Insufficient documentation

## 2024-08-24 LAB — COMPREHENSIVE METABOLIC PANEL WITH GFR
ALT: 38 U/L (ref 3–53)
AST: 35 U/L (ref 5–37)
Albumin: 4.1 g/dL (ref 3.5–5.2)
Alkaline Phosphatase: 89 U/L (ref 39–117)
BUN: 22 mg/dL (ref 6–23)
CO2: 29 meq/L (ref 19–32)
Calcium: 9.5 mg/dL (ref 8.4–10.5)
Chloride: 99 meq/L (ref 96–112)
Creatinine, Ser: 1.2 mg/dL (ref 0.40–1.50)
GFR: 61.66 mL/min
Glucose, Bld: 192 mg/dL — ABNORMAL HIGH (ref 70–99)
Potassium: 4 meq/L (ref 3.5–5.1)
Sodium: 137 meq/L (ref 135–145)
Total Bilirubin: 0.6 mg/dL (ref 0.2–1.2)
Total Protein: 6.8 g/dL (ref 6.0–8.3)

## 2024-08-24 LAB — LIPID PANEL
Cholesterol: 138 mg/dL (ref 28–200)
HDL: 37 mg/dL — ABNORMAL LOW
LDL Cholesterol: 26 mg/dL (ref 10–99)
NonHDL: 100.86
Total CHOL/HDL Ratio: 4
Triglycerides: 372 mg/dL — ABNORMAL HIGH (ref 10.0–149.0)
VLDL: 74.4 mg/dL — ABNORMAL HIGH (ref 0.0–40.0)

## 2024-08-24 LAB — MICROALBUMIN / CREATININE URINE RATIO
Creatinine,U: 328.4 mg/dL
Microalb Creat Ratio: 7.3 mg/g (ref 0.0–30.0)
Microalb, Ur: 2.4 mg/dL — ABNORMAL HIGH (ref 0.7–1.9)

## 2024-08-24 LAB — HEMOGLOBIN A1C: Hgb A1c MFr Bld: 7.1 % — ABNORMAL HIGH (ref 4.6–6.5)

## 2024-08-24 MED ORDER — TIRZEPATIDE 2.5 MG/0.5ML ~~LOC~~ SOAJ: 2.5000 mg | SUBCUTANEOUS | 2 refills | Status: AC

## 2024-08-24 NOTE — Assessment & Plan Note (Addendum)
 A1c has been increasing. I will reassess his A1c today. Continue metformin  500 mg twice daily.

## 2024-08-24 NOTE — Assessment & Plan Note (Signed)
 Continue efforts at weight loss.  He could be a candidate for a GLP-1 RA if he does not meet goals or tolerate metformin .

## 2024-08-24 NOTE — Assessment & Plan Note (Signed)
 4 vessel CABG last spring. Stable without angina. Continue aspirin  81 mg daily and metoprolol  tartrate 25 mg 1 1/2 tabs (37.5 mg) bid.

## 2024-08-24 NOTE — Assessment & Plan Note (Signed)
I will reassess this today.

## 2024-08-24 NOTE — Assessment & Plan Note (Addendum)
 Blood pressure is at goal. Continue metoprolol  tartrate 25 mg 1 1/2 tabs (37.5 mg) bid, amlodipine  10 mg daily, valsartan  320 mg daily, spironolactone  25 mg daily and hydralazine  50 mg TID.

## 2024-08-24 NOTE — Assessment & Plan Note (Signed)
 Mood has been stable. Continue lamotrigine  100 mg daily.

## 2024-08-24 NOTE — Assessment & Plan Note (Addendum)
 I will reassess lipids today. Continue rosuvastatin  10 mg daily, omega-3 fatty acids 1 gm daily, and inclisiran (Leqvio ) injections every 3 months.

## 2024-08-24 NOTE — Progress Notes (Signed)
 " [facility] [address] [place] [address] Dept: [phone] Dept Fax: [phone]  Chronic Care Office Visit  Subjective:    Patient ID: Bryan Brock, male    DOB: Jan 19, 1956, 68 y.o..   MRN: [id number]  Chief Complaint  Patient presents with   Hypertension    3 month f/u HTN.      History of Present Illness:  Patient is in today for reassessment of chronic medical conditions.   Mr. Duell has a history of coronary artery disease, s/p 4-vessel CABG 12/2022. He is managed with aspirin  81 mg daily, metoprolol  tartrate 37.5 mg bid and lipid management. He has no current angina.  Mr. Pettengill has heart failure with preserved ejection fraction. He is managed on metoprolol  tartrate 25 mg 1 1/2 tabs (37.5 mg) bid, valsartan  320 mg daily, spironolactone  25 mg daily, and furosemide  20 mg 2 tabs M, W, F.   Mr. Encinas has a history hypertension. He is currently managed on metoprolol  tartrate 25 mg 1 1/2 tabs (37.5 mg) bid, amlodipine  10 mg daily, valsartan  320 mg daily, spironolactone  25 mg daily and hydralazine  50 mg TID.   Mr. Bhardwaj has a history of hyperlipidemia. He is managed on rosuvastatin  10 mg daily, omega-3 fatty acids 1 gm daily, and inclisiran (Leqvio ) injections every 6 months.   Mr. Heesch was diagnosed with Type 2 diabetes in Sept. He is managed on metformin  500 mg twice daily.    Past Medical History: Patient Active Problem List   Diagnosis Date Noted   Insomnia 05/26/2024   Elevated alkaline phosphatase level 04/11/2023   Type 2 diabetes mellitus with cardiac complication (HCC) 02/09/2023   Chest wall pain 02/09/2023   Coronary artery disease 02/04/2023   Loculated pleural effusion 01/19/2023   S/P CABG x 4 01/02/2023   OSA (obstructive sleep apnea) 12/19/2021   Primary hypertension 10/29/2021   Morbid obesity with BMI of 40.0-44.9, adult (HCC) 10/17/2021   Bipolar 1 disorder, depressed (HCC) 05/11/2020    History of basal cell carcinoma 11/05/2007   Hyperlipidemia 11/05/2007   Premature ventricular contractions 11/05/2007   History of mitral valve prolapse 11/05/2007   Past Surgical History:  Procedure Laterality Date   APPLICATION OF WOUND VAC N/A 01/20/2023   Procedure: APPLICATION OF WOUND VAC;  Surgeon: Obadiah Coy, MD;  Location: [facility] OR;  Service: Thoracic;  Laterality: N/A;   APPLICATION OF WOUND VAC N/A 01/23/2023   Procedure: APPLICATION OF WOUND VAC;  Surgeon: Lowery Estefana RAMAN, DO;  Location: [facility] OR;  Service: Plastics;  Laterality: N/A;   APPLICATION OF WOUND VAC N/A 01/29/2023   Procedure: APPLICATION OF WOUND VAC;  Surgeon: Lowery Estefana RAMAN, DO;  Location: [facility] OR;  Service: Plastics;  Laterality: N/A;   CORONARY ARTERY BYPASS GRAFT N/A 01/02/2023   Procedure: CORONARY ARTERY BYPASS GRAFTING (CABG) TIMES FOUR USING THE LEFT INTERNAL MAMMARY ARTERY (LIMA) AND BILATERAL GREATER SAPHENOUS VEIN ARTERIES;  Surgeon: Lucas Dorise POUR, MD;  Location: [facility] OR;  Service: Open Heart Surgery;  Laterality: N/A;  Open median sternotomy   CORONARY PRESSURE/FFR STUDY N/A 11/21/2022   Procedure: INTRAVASCULAR PRESSURE WIRE/FFR STUDY;  Surgeon: Wendel Lurena POUR, MD;  Location: [facility];  Service: Cardiovascular;  Laterality: N/A;   EYE SURGERY  1958   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 Perfect ou   HYDROCELE EXCISION Bilateral 03/22/2022   Procedure: HYDROCELECTOMY ADULT;  Surgeon: Elisabeth Valli BIRCH, MD;  Location: [facility];  Service: Urology;  Laterality: Bilateral;  2 HRS   INCISION AND DRAINAGE OF WOUND N/A 01/29/2023   Procedure: Sternal wound irrigation and debridement, placement of myriad and wound VAC change;  Surgeon: Lowery Estefana RAMAN, DO;  Location: [facility];  Service: Plastics;  Laterality: N/A;   INCISION AND DRAINAGE OF WOUND N/A 01/31/2023   Procedure: IRRIGATION AND DEBRIDEMENT OF STERNAL WOUND;  Surgeon: Lowery Estefana RAMAN, DO;  Location: [facility];   Service: Plastics;  Laterality: N/A;   LEFT HEART CATH AND CORONARY ANGIOGRAPHY N/A 11/21/2022   Procedure: LEFT HEART CATH AND CORONARY ANGIOGRAPHY;  Surgeon: Wendel Lurena POUR, MD;  [facility];  Service: Cardiovascular;  Laterality: N/A;   MOHS SURGERY  1990   PECTORALIS FLAP Right 01/31/2023   Procedure: RIGHT PECTORALIS FLAP;  Surgeon: Lowery Estefana RAMAN, DO;  Location: [facility];  Service: Plastics;  Laterality: Right;   STERNAL INCISION RECLOSURE N/A 01/07/2023   Procedure: STERNAL REWIRING;  Surgeon: Lucas Dorise POUR, MD;  Location: [facility];  Service: Thoracic;  Laterality: N/A;   STERNAL WOUND DEBRIDEMENT N/A 01/20/2023   Procedure: DRAIN STERNAL INFECTION;  Surgeon: Obadiah Coy, MD;  Location: [facility] OR;  Service: Thoracic;  Laterality: N/A;   STERNAL WOUND DEBRIDEMENT N/A 01/23/2023   Procedure: Excision of sternal wound with Myriad;  Surgeon: Lowery Estefana RAMAN, DO;  Location: [facility];  Service: Plastics;  Laterality: N/A;   STRABISMUS SURGERY Left    TEE WITHOUT CARDIOVERSION N/A 01/02/2023   Procedure: TRANSESOPHAGEAL ECHOCARDIOGRAM;  Surgeon: Lucas Dorise POUR, MD;  Location[facility] OR;  Service: Open Heart Surgery;  Laterality: N/A;   TONSILLECTOMY AND ADENOIDECTOMY     WISDOM TOOTH EXTRACTION     Family History  Problem Relation Age of Onset   Heart disease Mother    Diabetes Mother    Obesity Mother    Hyperlipidemia Mother    Miscarriages / Stillbirths Mother    Cancer Father        skin cancer   Heart disease Father    Hyperlipidemia Father    Alcohol abuse Father    Early death Father    Colon cancer Neg Hx    Esophageal cancer Neg Hx    Rectal cancer Neg Hx    Stomach cancer Neg Hx    Outpatient Medications Prior to Visit  Medication Sig Dispense Refill   albuterol  (VENTOLIN  HFA) 108 (90 Base) MCG/ACT inhaler Inhale 2 puffs into the lungs every 6 (six) hours as needed for wheezing or shortness of breath. 6.7 g 0   amLODipine  (NORVASC ) 10 MG tablet  Take 1 tablet (10 mg total) by mouth daily. 90 tablet 2   APPLE CIDER VINEGAR PO Take 15 mLs by mouth every Monday, Wednesday, and Friday.     Ascorbic Acid (VITAMIN C) 1000 MG tablet Take 3,000 mg by mouth daily.     aspirin  EC 81 MG tablet Take 1 tablet (81 mg total) by mouth daily.     calcium  elemental as carbonate (TUMS ULTRA 1000) 400 MG chewable tablet Chew 2,000 mg by mouth 3 (three) times daily as needed for heartburn.     Cholecalciferol (DIALYVITE VITAMIN D 5000) 125 MCG (5000 UT) capsule Take 20,000 Units by mouth daily.     Coenzyme Q10 (COQ-10) 100 MG CAPS Take 100 mg by mouth daily.     Flaxseed, Linseed, (FLAX SEED OIL) 1000 MG CAPS Take 1,000 mg by mouth daily.     folic acid (FOLVITE) 800 MCG tablet Take 800 mcg  by mouth daily.     furosemide  (LASIX ) 20 MG tablet Take 1 tablet (20 mg total) by mouth 3 (three) times a week. Every Monday, Wednesay, Friday 45 tablet 3   hydrALAZINE  (APRESOLINE ) 50 MG tablet Take 1 tablet (50 mg total) by mouth 3 (three) times daily. 270 tablet 3   inclisiran (LEQVIO ) 284 MG/1.5ML SOSY injection Inject 1.5ml every 6 months     lamoTRIgine  (LAMICTAL ) 100 MG tablet Take 100 mg by mouth daily.     Liniments (BLUE-EMU SUPER STRENGTH EX) Apply 1 Application topically daily as needed (pain).     magnesium  oxide (MAG-OX) 400 (240 Mg) MG tablet Take 400 mg by mouth daily.     metFORMIN  (GLUCOPHAGE ) 500 MG tablet Take 1 tablet (500 mg total) by mouth 2 (two) times daily with a meal. 180 tablet 3   metoprolol  tartrate (LOPRESSOR ) 25 MG tablet Take 1.5 tablets (37.5 mg total) by mouth 2 (two) times daily. 270 tablet 3   Multiple Vitamins-Minerals (MULTIVITAMIN WITH MINERALS) tablet Take 1 tablet by mouth daily.     Omega 3 1000 MG CAPS Take 1,000 mg by mouth daily.     rosuvastatin  (CRESTOR ) 10 MG tablet Take 1 tablet (10 mg total) by mouth daily. 90 tablet 1   sildenafil (VIAGRA) 100 MG tablet Take 100 mg by mouth daily as needed for erectile dysfunction.      spironolactone  (ALDACTONE ) 25 MG tablet Take 1 tablet (25 mg total) by mouth daily. 90 tablet 3   traZODone  (DESYREL ) 100 MG tablet Take 0.5-1 tablets (50-100 mg total) by mouth at bedtime as needed for sleep. 30 tablet 11   valsartan  (DIOVAN ) 320 MG tablet TAKE 1 TABLET(320 MG) BY MOUTH DAILY 90 tablet 3   zinc  gluconate 50 MG tablet Take 50 mg by mouth daily.     No facility-administered medications prior to visit.   Allergies[1]   Objective:   Today's Vitals   08/24/24 1018  BP: 118/64  Pulse: 68  Temp: 98.1 F (36.7 C)  TempSrc: Temporal  SpO2: 95%  Weight: (!) 326 lb 3.2 oz (148 kg)  Height: 5' 11 (1.803 m)   Body mass index is 45.5 kg/m.   General: Well developed, well nourished. No acute distress. Extremities: 1+ edema with some darkening of the skin of the lower legs.. Skin: Warm and dry. No rashes. Neuro: CN II-XII intact. Normal sensation and DTR bilaterally. Psych: Alert and oriented. Normal mood and affect.  Health Maintenance Due  Topic Date Due   Hepatitis C Screening  Never done   Zoster Vaccines- Shingrix (1 of 2) Never done   FOOT EXAM  08/11/2024   Lab Results  Lab Results  Component Value Date   HGBA1C 7.6 (H) 05/19/2024   HGBA1C 7.0 (H) 02/11/2024   HGBA1C 6.8 (H) 11/13/2023   Lab Results  Component Value Date   CHOL 105 05/02/2023   HDL 56 05/02/2023   LDLCALC 26 05/02/2023   TRIG 131 05/02/2023   CHOLHDL 1.9 05/02/2023    Assessment & Plan:   Problem List Items Addressed This Visit       Cardiovascular and Mediastinum   Coronary artery disease   4 vessel CABG last spring. Stable without angina. Continue aspirin  81 mg daily and metoprolol  tartrate 25 mg 1 1/2 tabs (37.5 mg) bid.      Heart failure with preserved ejection fraction (HCC)   Compensated. Continue metoprolol  tartrate 25 mg 1 1/2 tabs (37.5 mg) bid, valsartan  320 mg daily,  spironolactone  25 mg daily, and furosemide  20 mg 2 tabs M, W, F.      Primary hypertension    Blood pressure is at goal. Continue metoprolol  tartrate 25 mg 1 1/2 tabs (37.5 mg) bid, amlodipine  10 mg daily, valsartan  320 mg daily, spironolactone  25 mg daily and hydralazine  50 mg TID.      Type 2 diabetes mellitus with cardiac complication (HCC) - Primary   A1c has been increasing. I will reassess his A1c today. Continue metformin  500 mg twice daily.      Relevant Orders   Microalbumin / creatinine urine ratio   Hemoglobin A1c   Comprehensive metabolic panel with GFR     Genitourinary   Stage 3a chronic kidney disease (CKD) (HCC)   Continue focus on blood pressure and glucose control, adequate hydration, and avoidance of nephrotoxic medications.         Other   Bipolar 1 disorder, depressed (HCC)   Mood has been stable. Continue lamotrigine  100 mg daily.      Elevated alkaline phosphatase level   I will reassess this today.      Relevant Orders   Comprehensive metabolic panel with GFR   Hyperlipidemia   I will reassess lipids today. Continue rosuvastatin  10 mg daily, omega-3 fatty acids 1 gm daily, and inclisiran (Leqvio ) injections every 3 months.      Relevant Orders   Lipid panel   Morbid obesity with BMI of 40.0-44.9, adult (HCC)   Continue efforts at weight loss.  He could be a candidate for a GLP-1 RA if he does not meet goals or tolerate metformin .      Other Visit Diagnoses       Long term current use of oral hypoglycemic drug           Return in about 3 months (around 11/22/2024).   Garnette CHRISTELLA Simpler, MD  I,Emily Lagle,acting as a scribe for Garnette CHRISTELLA Simpler, MD.,have documented all relevant documentation on the behalf of Garnette CHRISTELLA Simpler, MD.  I, Garnette CHRISTELLA Simpler, MD, have reviewed all documentation for this visit. The documentation on 08/24/2024 for the exam, diagnosis, procedures, and orders are all accurate and complete.     [1]  Allergies Allergen Reactions   Latex Dermatitis   Lisinopril  Cough   "

## 2024-08-24 NOTE — Assessment & Plan Note (Signed)
 Continue focus on blood pressure and glucose control, adequate hydration, and avoidance of nephrotoxic medications.

## 2024-08-24 NOTE — Assessment & Plan Note (Signed)
 Compensated. Continue metoprolol  tartrate 25 mg 1 1/2 tabs (37.5 mg) bid, valsartan  320 mg daily, spironolactone  25 mg daily, and furosemide  20 mg 2 tabs M, W, F.

## 2024-08-30 ENCOUNTER — Other Ambulatory Visit: Payer: Self-pay | Admitting: Nurse Practitioner

## 2024-09-01 ENCOUNTER — Encounter: Payer: Self-pay | Admitting: Family Medicine

## 2024-09-01 DIAGNOSIS — I5032 Chronic diastolic (congestive) heart failure: Secondary | ICD-10-CM

## 2024-09-01 DIAGNOSIS — N1831 Chronic kidney disease, stage 3a: Secondary | ICD-10-CM

## 2024-09-01 DIAGNOSIS — E1159 Type 2 diabetes mellitus with other circulatory complications: Secondary | ICD-10-CM

## 2024-09-01 DIAGNOSIS — I2510 Atherosclerotic heart disease of native coronary artery without angina pectoris: Secondary | ICD-10-CM

## 2024-09-01 MED ORDER — SPIRONOLACTONE 25 MG PO TABS: 25.0000 mg | ORAL_TABLET | Freq: Every day | ORAL | 2 refills | Status: AC

## 2024-09-02 ENCOUNTER — Telehealth: Payer: Self-pay

## 2024-09-02 NOTE — Telephone Encounter (Signed)
 Called [facility] @ [phone], spoke to [place],@ 8:40 am regarding bill # [id number] ($395.06).  was advised that denied due to routine service  not covered.  Told to call Medicare.   Called Medicare @ 8:50 am and spoke to [place] F.   Was then given a provider Medicare line to call [phone].  Called provider line number  @ 9:08 and spoke to [place] and was advised that I needed a PTN# in order for them to talk to me. Got the PTN # and called Medicare back and spoke to [facility] @ 9:25 am, was transfer to Part B and then spoke to [place] @ 9:50 am.  She advise to have [facility] resubmit the charge without using the Z021 as primary DX code.   Called [facility] back @ 10:20 am spoke to [place], and they will resubmit the bill to Medicare using Z79.84.  it can take 30- 40 days to get an answer.  Called patient and advise that it has been address and how long it can take.  Dm/cma

## 2024-09-02 NOTE — Telephone Encounter (Signed)
 Spoke to patient and advised to try to use the Mounjaro  saving card plan online to see if he can get help with it.  Is there any other options for him? Please review and advise.  Thanks.  Dm/cma

## 2024-09-06 ENCOUNTER — Telehealth: Payer: Self-pay

## 2024-09-06 NOTE — Telephone Encounter (Signed)
 Auth Submission: NO AUTH NEEDED Site of care: Site of care: [facility] Payer: Medicare A/B with BCBS supplement Medication & CPT/J Code(s) submitted: Leqvio  (Inclisiran) J1306 Diagnosis Code:  Route of submission (phone, fax, portal):  Phone # Fax # Auth type: Buy/Bill PB Units/visits requested: 284mg  x 2 doses Reference number:  Approval from: 09/06/24 to 09/25/25

## 2024-09-08 ENCOUNTER — Telehealth: Payer: Self-pay

## 2024-09-08 NOTE — Progress Notes (Signed)
 Complex Care Management Note  Care Guide Note 09/08/2024 Name: JAMEN LOISEAU MRN: [id number] DOB: May 10, 1956  ROCCO KERKHOFF is a 69 y.o. year old male who sees Rudd, Garnette HERO, MD for primary care. I reached out to Glendia LOISE Sharps by phone today to offer complex care management services.  Mr. Kier was given information about Complex Care Management services today including:   The Complex Care Management services include support from the care team which includes your Nurse Care Manager, Clinical Social Worker, or Pharmacist.  The Complex Care Management team is here to help remove barriers to the health concerns and goals most important to you. Complex Care Management services are voluntary, and the patient may decline or stop services at any time by request to their care team member.   Complex Care Management Consent Status: Patient agreed to services and verbal consent obtained.   Follow up plan:  Telephone appointment with complex care management team member scheduled for:  09/21/24 at 9:00 a.m.  Encounter Outcome:  Patient Scheduled  Dreama [facility]  [facility], [facility] VBCI Assistant Direct Dial: [phone]  Fax: [phone]

## 2024-09-21 ENCOUNTER — Other Ambulatory Visit: Payer: Self-pay

## 2024-09-21 DIAGNOSIS — I5032 Chronic diastolic (congestive) heart failure: Secondary | ICD-10-CM

## 2024-09-21 DIAGNOSIS — E1159 Type 2 diabetes mellitus with other circulatory complications: Secondary | ICD-10-CM

## 2024-09-21 DIAGNOSIS — E785 Hyperlipidemia, unspecified: Secondary | ICD-10-CM

## 2024-09-21 DIAGNOSIS — I10 Essential (primary) hypertension: Secondary | ICD-10-CM

## 2024-09-21 NOTE — Progress Notes (Signed)
 "  09/21/2024 Name: Bryan Brock MRN: [id number] DOB: 01-23-1956  Chief Complaint  Patient presents with   Diabetes Management Plan   Bryan Brock is a 69 y.o. year old male who presented for a telephone visit.   They were referred to the pharmacist by their PCP for assistance in managing diabetes and medication access.   Subjective:  Care Team: Primary Care Provider: Thedora Garnette HERO, MD ; Next Scheduled Visit: 11/24/24  Medication Access/Adherence  Current Pharmacy:  [id number] - [place], [place] - [address] AT [facility] [address] [place] [address] Phone: [phone] Fax: [phone]  Patient reports affordability concerns with their medications: Yes  Patient reports access/transportation concerns to their pharmacy: No  Patient reports adherence concerns with their medications:  Yes    Diabetes: Current medications: metformin  500mg  BID -Recently prescribed Mounjaro  2.5mg  weekly but cannot afford based on $626 copay for 1st fill -Current glucose readings: patient does not monitor at home -Patient denies hypoglycemic s/sx including dizziness, shakiness, sweating.  -Patient denies hyperglycemic symptoms including polyuria, polydipsia, polyphagia, nocturia, neuropathy, blurred vision. -Does endorse limited activity as well as challenges with overeating and dietary choices (enjoys pizza, other Italian and Mexican foods, and ice cream) -Hesitant to start GLP1 based on cost and potential side effects (GI upset, unknown long-term effects)  Macrovascular and Microvascular Risk Reduction:  Statin? yes (rosuvastatin  10mg  daily- patient also receives Leqvio  injection q6mos); ACEi/ARB? yes (valsartan  320mg  daily) Last urinary albumin /creatinine ratio:  Lab Results  Component Value Date   MICRALBCREAT 7.3 08/24/2024   Last eye exam:  Lab Results  Component Value Date   HMDIABEYEEXA No Retinopathy 12/05/2023   Last foot exam:  08/12/2023 Tobacco Use:  Tobacco Use: High Risk (08/24/2024)   Patient History    Smoking Tobacco Use: Never    Smokeless Tobacco Use: Current    Passive Exposure: Not on file   Objective:  Lab Results  Component Value Date   HGBA1C 7.1 (H) 08/24/2024   Lab Results  Component Value Date   CREATININE 1.20 08/24/2024   BUN 22 08/24/2024   NA 137 08/24/2024   K 4.0 08/24/2024   CL 99 08/24/2024   CO2 29 08/24/2024   Lab Results  Component Value Date   CHOL 138 08/24/2024   HDL 37.00 (L) 08/24/2024   LDLCALC 26 08/24/2024   TRIG 372.0 (H) 08/24/2024   CHOLHDL 4 08/24/2024   Assessment/Plan:   Diabetes: -Currently uncontrolled; goal A1c <7%. Cardiorenal risk reduction is optimized.. Blood pressure is at goal <130/80. LDL is at goal.  -Discussed benefits of GLP1 medications as well as potential side effects in depth with patient.  No contraindications or drug-drug interactions noted.  Patient is open to trying therapy if affordable to him. -Unable to afford Mounjaro  based on plan deductible that would make first fill >$600 -Does not qualify for LIS Medicare Extra Help based on [facility] -Does not qualify for manufacturer coupon card since he has Medicare, and [employer] does not offer PAP for Mounjaro .  However, [employer] PAP does offer Trulicity to eligible patients.  His reported income should fall just within what the program will approve to receive Trulicity at no cost.  While this medication likely will not offer as much weight loss benefit for him as Mounjaro  may, it should provide some along with A1c lowering since Mounjaro  will not be affordable.   -Patient is in agreement with plan.  If PCP is, I will  coordinate with medication assistance team to initiate the application process. -Continue metformin  500mg  BID at this time; if we cannot get GLP1 on board, I recommend increasing dose of this to 1000mg  BID  Follow Up Plan: Will monitor progress of PAP if PCP in agreement with plan,  and will check in with patient regularly   [name], PharmD, DPLA    "

## 2024-10-01 ENCOUNTER — Telehealth: Payer: Self-pay

## 2024-10-01 NOTE — Telephone Encounter (Signed)
 NEEDS TRULICITY FORMS

## 2024-10-04 ENCOUNTER — Ambulatory Visit

## 2024-11-24 ENCOUNTER — Ambulatory Visit: Admitting: Family Medicine

## 2025-03-21 ENCOUNTER — Ambulatory Visit
